# Patient Record
Sex: Male | Born: 1955 | Race: White | Hispanic: No | Marital: Single | Smoking: Current every day smoker
Health system: Southern US, Community
[De-identification: ages and names within clinical notes are randomized; demographics above are authoritative.]

## PROBLEM LIST (undated history)

## (undated) ENCOUNTER — Emergency Department (HOSPITAL_COMMUNITY): Admission: EM | Payer: 59 | Source: Home / Self Care

## (undated) DIAGNOSIS — Z8619 Personal history of other infectious and parasitic diseases: Secondary | ICD-10-CM

## (undated) DIAGNOSIS — F329 Major depressive disorder, single episode, unspecified: Secondary | ICD-10-CM

## (undated) DIAGNOSIS — F419 Anxiety disorder, unspecified: Secondary | ICD-10-CM

## (undated) DIAGNOSIS — E785 Hyperlipidemia, unspecified: Secondary | ICD-10-CM

## (undated) DIAGNOSIS — C801 Malignant (primary) neoplasm, unspecified: Secondary | ICD-10-CM

## (undated) DIAGNOSIS — N183 Chronic kidney disease, stage 3 unspecified: Secondary | ICD-10-CM

## (undated) DIAGNOSIS — F200 Paranoid schizophrenia: Secondary | ICD-10-CM

## (undated) DIAGNOSIS — R32 Unspecified urinary incontinence: Secondary | ICD-10-CM

## (undated) DIAGNOSIS — K219 Gastro-esophageal reflux disease without esophagitis: Secondary | ICD-10-CM

## (undated) DIAGNOSIS — F32A Depression, unspecified: Secondary | ICD-10-CM

---

## 1989-03-30 HISTORY — PX: CERVICAL DISC SURGERY: SHX588

## 2002-11-06 ENCOUNTER — Emergency Department (HOSPITAL_COMMUNITY): Admission: EM | Admit: 2002-11-06 | Discharge: 2002-11-06 | Payer: Self-pay | Admitting: Emergency Medicine

## 2002-11-06 ENCOUNTER — Encounter: Payer: Self-pay | Admitting: Emergency Medicine

## 2002-11-26 ENCOUNTER — Ambulatory Visit (HOSPITAL_COMMUNITY): Admission: RE | Admit: 2002-11-26 | Discharge: 2002-11-26 | Payer: Self-pay | Admitting: Neurosurgery

## 2002-11-26 ENCOUNTER — Encounter: Payer: Self-pay | Admitting: Neurosurgery

## 2003-01-19 ENCOUNTER — Encounter: Payer: Self-pay | Admitting: Neurosurgery

## 2003-01-19 ENCOUNTER — Inpatient Hospital Stay (HOSPITAL_COMMUNITY): Admission: RE | Admit: 2003-01-19 | Discharge: 2003-01-20 | Payer: Self-pay | Admitting: Neurosurgery

## 2003-03-18 ENCOUNTER — Encounter: Payer: Self-pay | Admitting: Neurosurgery

## 2003-03-18 ENCOUNTER — Encounter: Admission: RE | Admit: 2003-03-18 | Discharge: 2003-03-18 | Payer: Self-pay | Admitting: Neurosurgery

## 2015-01-13 ENCOUNTER — Encounter (HOSPITAL_BASED_OUTPATIENT_CLINIC_OR_DEPARTMENT_OTHER): Payer: Self-pay | Admitting: *Deleted

## 2015-01-13 ENCOUNTER — Emergency Department (HOSPITAL_BASED_OUTPATIENT_CLINIC_OR_DEPARTMENT_OTHER): Payer: Medicare Other

## 2015-01-13 ENCOUNTER — Inpatient Hospital Stay (HOSPITAL_BASED_OUTPATIENT_CLINIC_OR_DEPARTMENT_OTHER)
Admission: EM | Admit: 2015-01-13 | Discharge: 2015-01-20 | DRG: 871 | Disposition: A | Discharge status: Home Health | Payer: Medicare Other | Attending: Internal Medicine | Admitting: Internal Medicine

## 2015-01-13 DIAGNOSIS — R599 Enlarged lymph nodes, unspecified: Secondary | ICD-10-CM | POA: Diagnosis present

## 2015-01-13 DIAGNOSIS — A4181 Sepsis due to Enterococcus: Secondary | ICD-10-CM | POA: Diagnosis present

## 2015-01-13 DIAGNOSIS — N320 Bladder-neck obstruction: Secondary | ICD-10-CM | POA: Diagnosis not present

## 2015-01-13 DIAGNOSIS — T796XXA Traumatic ischemia of muscle, initial encounter: Secondary | ICD-10-CM | POA: Diagnosis not present

## 2015-01-13 DIAGNOSIS — G9340 Encephalopathy, unspecified: Secondary | ICD-10-CM | POA: Diagnosis not present

## 2015-01-13 DIAGNOSIS — M6282 Rhabdomyolysis: Secondary | ICD-10-CM | POA: Diagnosis present

## 2015-01-13 DIAGNOSIS — F200 Paranoid schizophrenia: Secondary | ICD-10-CM | POA: Diagnosis present

## 2015-01-13 DIAGNOSIS — E870 Hyperosmolality and hypernatremia: Secondary | ICD-10-CM | POA: Diagnosis present

## 2015-01-13 DIAGNOSIS — A419 Sepsis, unspecified organism: Secondary | ICD-10-CM | POA: Diagnosis present

## 2015-01-13 DIAGNOSIS — E860 Dehydration: Secondary | ICD-10-CM | POA: Diagnosis not present

## 2015-01-13 DIAGNOSIS — N179 Acute kidney failure, unspecified: Secondary | ICD-10-CM | POA: Diagnosis present

## 2015-01-13 DIAGNOSIS — B952 Enterococcus as the cause of diseases classified elsewhere: Secondary | ICD-10-CM | POA: Diagnosis present

## 2015-01-13 DIAGNOSIS — N390 Urinary tract infection, site not specified: Secondary | ICD-10-CM | POA: Diagnosis not present

## 2015-01-13 DIAGNOSIS — N189 Chronic kidney disease, unspecified: Secondary | ICD-10-CM | POA: Diagnosis not present

## 2015-01-13 DIAGNOSIS — W19XXXA Unspecified fall, initial encounter: Secondary | ICD-10-CM

## 2015-01-13 DIAGNOSIS — E876 Hypokalemia: Secondary | ICD-10-CM | POA: Diagnosis not present

## 2015-01-13 DIAGNOSIS — F17210 Nicotine dependence, cigarettes, uncomplicated: Secondary | ICD-10-CM | POA: Diagnosis not present

## 2015-01-13 DIAGNOSIS — E785 Hyperlipidemia, unspecified: Secondary | ICD-10-CM | POA: Diagnosis present

## 2015-01-13 DIAGNOSIS — N19 Unspecified kidney failure: Secondary | ICD-10-CM

## 2015-01-13 HISTORY — DX: Paranoid schizophrenia: F20.0

## 2015-01-13 LAB — RAPID URINE DRUG SCREEN, HOSP PERFORMED
AMPHETAMINES: NOT DETECTED
Barbiturates: NOT DETECTED
Benzodiazepines: NOT DETECTED
COCAINE: NOT DETECTED
OPIATES: NOT DETECTED
TETRAHYDROCANNABINOL: NOT DETECTED

## 2015-01-13 LAB — CBC WITH DIFFERENTIAL/PLATELET
BASOS ABS: 0 10*3/uL (ref 0.0–0.1)
BASOS PCT: 0 % (ref 0–1)
EOS ABS: 0 10*3/uL (ref 0.0–0.7)
EOS PCT: 0 % (ref 0–5)
HEMATOCRIT: 40.5 % (ref 39.0–52.0)
HEMOGLOBIN: 14.2 g/dL (ref 13.0–17.0)
LYMPHS PCT: 3 % — AB (ref 12–46)
Lymphs Abs: 0.7 10*3/uL (ref 0.7–4.0)
MCH: 28 pg (ref 26.0–34.0)
MCHC: 35.1 g/dL (ref 30.0–36.0)
MCV: 79.9 fL (ref 78.0–100.0)
MONO ABS: 1.4 10*3/uL — AB (ref 0.1–1.0)
Monocytes Relative: 6 % (ref 3–12)
Neutro Abs: 21.9 10*3/uL — ABNORMAL HIGH (ref 1.7–7.7)
Neutrophils Relative %: 91 % — ABNORMAL HIGH (ref 43–77)
Platelets: 270 10*3/uL (ref 150–400)
RBC: 5.07 MIL/uL (ref 4.22–5.81)
RDW: 14.4 % (ref 11.5–15.5)
WBC: 24 10*3/uL — AB (ref 4.0–10.5)

## 2015-01-13 LAB — URINE MICROSCOPIC-ADD ON

## 2015-01-13 LAB — URINALYSIS, ROUTINE W REFLEX MICROSCOPIC
Bilirubin Urine: NEGATIVE
Glucose, UA: NEGATIVE mg/dL
Ketones, ur: NEGATIVE mg/dL
NITRITE: NEGATIVE
Protein, ur: 30 mg/dL — AB
SPECIFIC GRAVITY, URINE: 1.005 (ref 1.005–1.030)
Urobilinogen, UA: 0.2 mg/dL (ref 0.0–1.0)
pH: 6 (ref 5.0–8.0)

## 2015-01-13 LAB — COMPREHENSIVE METABOLIC PANEL
ALK PHOS: 75 U/L (ref 38–126)
ALT: 118 U/L — AB (ref 17–63)
AST: 410 U/L — ABNORMAL HIGH (ref 15–41)
Albumin: 3.8 g/dL (ref 3.5–5.0)
Anion gap: 12 (ref 5–15)
BUN: 15 mg/dL (ref 6–20)
CALCIUM: 8.6 mg/dL — AB (ref 8.9–10.3)
CO2: 25 mmol/L (ref 22–32)
Chloride: 99 mmol/L — ABNORMAL LOW (ref 101–111)
Creatinine, Ser: 2.51 mg/dL — ABNORMAL HIGH (ref 0.61–1.24)
GFR, EST AFRICAN AMERICAN: 31 mL/min — AB (ref >60)
GFR, EST NON AFRICAN AMERICAN: 27 mL/min — AB (ref >60)
Glucose, Bld: 140 mg/dL — ABNORMAL HIGH (ref 65–99)
Potassium: 2.5 mmol/L — CL (ref 3.5–5.1)
SODIUM: 136 mmol/L (ref 135–145)
Total Bilirubin: 1.1 mg/dL (ref 0.3–1.2)
Total Protein: 6.7 g/dL (ref 6.5–8.1)

## 2015-01-13 LAB — I-STAT CG4 LACTIC ACID, ED: Lactic Acid, Venous: 1.23 mmol/L (ref 0.5–2.0)

## 2015-01-13 LAB — MRSA PCR SCREENING: MRSA by PCR: NEGATIVE

## 2015-01-13 LAB — CK: CK TOTAL: 48660 U/L — AB (ref 49–397)

## 2015-01-13 LAB — ETHANOL: Alcohol, Ethyl (B): <5 mg/dL (ref <5)

## 2015-01-13 MED ORDER — CEFTRIAXONE SODIUM IN DEXTROSE 20 MG/ML IV SOLN
1.0000 g | INTRAVENOUS | Status: DC
  Administered 2015-01-13: 1 g via INTRAVENOUS
  Filled 2015-01-13 (×2): qty 50

## 2015-01-13 MED ORDER — SODIUM CHLORIDE 0.9 % IV SOLN
INTRAVENOUS | Status: DC
  Administered 2015-01-14 (×2): via INTRAVENOUS

## 2015-01-13 MED ORDER — POTASSIUM CHLORIDE CRYS ER 20 MEQ PO TBCR: 30.0000 meq | EXTENDED_RELEASE_TABLET | Freq: Once | ORAL | Status: DC

## 2015-01-13 MED ORDER — VANCOMYCIN HCL IN DEXTROSE 1-5 GM/200ML-% IV SOLN
1000.0000 mg | Freq: Once | INTRAVENOUS | Status: AC
  Administered 2015-01-13: 1000 mg via INTRAVENOUS
  Filled 2015-01-13: qty 200

## 2015-01-13 MED ORDER — HEPARIN SODIUM (PORCINE) 5000 UNIT/ML IJ SOLN
5000.0000 [IU] | Freq: Three times a day (TID) | INTRAMUSCULAR | Status: DC
  Administered 2015-01-13 – 2015-01-20 (×21): 5000 [IU] via SUBCUTANEOUS
  Filled 2015-01-13 (×22): qty 1

## 2015-01-13 MED ORDER — ACETAMINOPHEN 325 MG PO TABS
650.0000 mg | ORAL_TABLET | Freq: Once | ORAL | Status: AC
  Administered 2015-01-13: 650 mg via ORAL
  Filled 2015-01-13: qty 2

## 2015-01-13 MED ORDER — TETANUS-DIPHTH-ACELL PERTUSSIS 5-2.5-18.5 LF-MCG/0.5 IM SUSP
0.5000 mL | Freq: Once | INTRAMUSCULAR | Status: AC
  Administered 2015-01-13: 0.5 mL via INTRAMUSCULAR
  Filled 2015-01-13: qty 0.5

## 2015-01-13 MED ORDER — POTASSIUM CHLORIDE CRYS ER 20 MEQ PO TBCR
40.0000 meq | EXTENDED_RELEASE_TABLET | Freq: Once | ORAL | Status: AC
  Administered 2015-01-13: 40 meq via ORAL
  Filled 2015-01-13: qty 2

## 2015-01-13 MED ORDER — PIPERACILLIN-TAZOBACTAM 3.375 G IVPB
3.3750 g | Freq: Once | INTRAVENOUS | Status: AC
  Administered 2015-01-13: 3.375 g via INTRAVENOUS
  Filled 2015-01-13: qty 50

## 2015-01-13 MED ORDER — SODIUM CHLORIDE 0.9 % IV SOLN
INTRAVENOUS | Status: DC
  Administered 2015-01-13 – 2015-01-18 (×5): via INTRAVENOUS

## 2015-01-13 MED ORDER — CLOZAPINE 100 MG PO TABS
800.0000 mg | ORAL_TABLET | Freq: Every day | ORAL | Status: DC
  Administered 2015-01-13 – 2015-01-19 (×6): 800 mg via ORAL
  Filled 2015-01-13 (×9): qty 8

## 2015-01-13 MED ORDER — BENZTROPINE MESYLATE 0.5 MG PO TABS
0.5000 mg | ORAL_TABLET | Freq: Every morning | ORAL | Status: DC
  Administered 2015-01-15 – 2015-01-20 (×6): 0.5 mg via ORAL
  Filled 2015-01-13 (×7): qty 1

## 2015-01-13 MED ORDER — SODIUM CHLORIDE 0.9 % IV BOLUS (SEPSIS): 1000.0000 mL | Freq: Once | INTRAVENOUS | Status: DC

## 2015-01-13 MED ORDER — SODIUM CHLORIDE 0.9 % IJ SOLN
3.0000 mL | Freq: Two times a day (BID) | INTRAMUSCULAR | Status: DC
  Administered 2015-01-13 – 2015-01-20 (×12): 3 mL via INTRAVENOUS

## 2015-01-13 MED ORDER — LIDOCAINE-EPINEPHRINE 2 %-1:100000 IJ SOLN
1.0000 mL | Freq: Once | INTRAMUSCULAR | Status: AC
  Administered 2015-01-13: 1 mL

## 2015-01-13 MED ORDER — POTASSIUM CHLORIDE 10 MEQ/100ML IV SOLN
10.0000 meq | Freq: Once | INTRAVENOUS | Status: AC
  Administered 2015-01-13: 10 meq via INTRAVENOUS
  Filled 2015-01-13: qty 100

## 2015-01-13 MED ORDER — SODIUM CHLORIDE 0.9 % IV BOLUS (SEPSIS)
1000.0000 mL | Freq: Once | INTRAVENOUS | Status: AC
  Administered 2015-01-13: 1000 mL via INTRAVENOUS

## 2015-01-13 NOTE — ED Notes (Signed)
His brother had not heard from him in 4 days. He stopped by his house to check on him and he was found on the floor. Responsive. Blood on his face. Laceration to his left eyebrow and hematoma to his forehead. Left arm pain. He is alert but confused.

## 2015-01-13 NOTE — ED Notes (Signed)
Urinal provided.

## 2015-01-13 NOTE — ED Provider Notes (Addendum)
Patient care accepted from Dr. Maryan Rued  4:01 PM This is a 59 year old man who comes in today with fever to 10 to, tachycardia, and generalized weakness. Workup is significant for urinary tract infection. Patient has received 1 L of normal saline, Zosyn, and vancomycin. He has had full workup with head CT, chest x-Rennie Hack, urinalysis, and blood cultures. Head CT done as he hit his head from fall due to weakness. Weakness appears to be generalized.  1 infectious disease-patient is febrile, tachycardic with likely source urine. Urine culture and blood cultures are pending. Patient has received IV fluids, Zosyn, and vancomycin. Initial lactic acid is normal. Patient is normotensive. He appears to be at baseline mental status. His creatinine is elevated to 5 indicating some acute kidney injury. LFTs are also elevated area 2-neuro-  patient with generalized weakness and laceration to head from fall. Patient has nonfocal neurological exam and is hypokalemic which could explain his generalized weakness. He had a head CT as it was noted he had trauma to his head with no acute intracranial abnormalities noted. 3- metabolic- hypokalemia- being repleted. 4- psychiatric- known schizophrenia- appears stable on meds. 5- respiratory- patient states some cough, but cxr clear.  Patient is smoker. 6 cardiac- tachycardia- likely secondary to fever, possibly infection, and dehydration.  7- trauma- head ct negative.  Laceration repaired after fall- sutures out 5-7 days.  Knee and elbow contusions.  CK pending.    Plan transfer to [facility] for further evaluation and treatment.   Prescription with Dr. Dyann Kief and plan transfer to stepdown unit at [facility].  Pattricia Boss, MD 01/13/15 [id number]  Pattricia Boss, MD 01/13/15 [phone]

## 2015-01-13 NOTE — ED Notes (Signed)
Pt was found on the floor in his home by his brother.  Unknown time pt has been on the floor or sequence of events

## 2015-01-13 NOTE — ED Notes (Signed)
[facility] at bedside preparing for transport.  Pt stable upon transport.

## 2015-01-13 NOTE — Progress Notes (Signed)
Called from [facility]. (Dr. Jeanell Sparrow)  Patient is Edwin [name]; 58 y/o male with hx of schizophrenia who presented after fall, with multiple bruises and generalized weakness. Patient found to be dehydrated, hypokalemic, febrile up to 102, renal failure Cr 2.5 and UA suggesting UTI. CXR clear. CT head and spine w/o acute abnormalities. Initiated on sepsis protocol. Lactic acid normal.  Plan: -accepted to Stepdown, inpatient -on broad spectrum antibiotics and receiving fluid resuscitation -CK pending -follow cx's (Urine and blood cx's)   Barton Dubois [phone]

## 2015-01-13 NOTE — ED Notes (Signed)
Pt unable to void.  I&O cath performed without difficulty with cloudy amber return and sediment noted. Pt tolerated well.

## 2015-01-13 NOTE — ED Provider Notes (Signed)
CSN: [id number]     Arrival date & time 01/13/15  1401 History   First MD Initiated Contact with Patient 01/13/15 1413     Chief Complaint  Patient presents with  . Fall  . Head Injury     (Consider location/radiation/quality/duration/timing/severity/associated sxs/prior Treatment) HPI Comments: Patient being brought in by his brother with a history of paranoid schizophrenia on medication who lives alone. Brother had not heard from him in several days and was unable to reach him by phone. When he got to the house the patient was lying in the floor of his home awake and responsive. He was unable to get up on his own. Brother was able to pick him up and after sitting up he was able to stand and walk independently. Brother states normally he is able to drive and take care of himself without difficulty. This is very different from baseline today. Brother denies patient having history of falls or substance use. The patient is unclear why he fell but denies LOC. He does not remember how long he's been on the floor but brother states it appears that he was crawling around on the floor for some time.  Per patient's brother he has not had any recent medication changes. He does have a history of prior neck surgery but has no difficulty ambulating or persistent weakness.  Patient denies cough, shortness of breath, nausea, vomiting or abdominal pain.  Patient is a heavy smoker. He has eaten today.  Patient is a 59 y.o. male presenting with fall and head injury. The history is provided by the patient and a relative. The history is limited by the condition of the patient.  Fall This is a new problem. Episode onset: Unknown. The problem occurs constantly.  Head Injury   Past Medical History  Diagnosis Date  . Paranoid schizophrenia    Past Surgical History  Procedure Laterality Date  . Neck surgery     No family history on file. History  Substance Use Topics  . Smoking status: Current Every Day Smoker  -- 1.00 packs/day    Types: Cigarettes  . Smokeless tobacco: Not on file  . Alcohol Use: No    Review of Systems  All other systems reviewed and are negative.     Allergies  Review of patient's allergies indicates no known allergies.  Home Medications   Prior to Admission medications   Not on File   BP 107/72 mmHg  Pulse 124  Temp(Src) 100.9 F (38.3 C) (Oral)  Resp 16  Ht 5\' 9"  (1.753 m)  SpO2 95% Physical Exam  Constitutional: He is oriented to person, place, and time. He appears well-developed and well-nourished. No distress.  Disheveled appearance and smells of urine  HENT:  Head: Normocephalic. Head is with abrasion and with laceration.    Right Ear: Tympanic membrane and ear canal normal.  Left Ear: Tympanic membrane and ear canal normal.  Mouth/Throat: Oropharynx is clear and moist.  Multiple abrasions over the for head and a laceration over the left eyebrow.  Multiple dental caries and gingival disease. Moist mucous membranes.  Eyes: Conjunctivae and EOM are normal. Pupils are equal, round, and reactive to light.  Neck: Normal range of motion. Neck supple.  Cardiovascular: Regular rhythm and intact distal pulses.  Tachycardia present.   No murmur heard. Pulmonary/Chest: Effort normal and breath sounds normal. No respiratory distress. He has no wheezes. He has no rales.  Abdominal: Soft. He exhibits no distension. There is no tenderness. There is  no rebound and no guarding.  Musculoskeletal: Normal range of motion. He exhibits no edema or tenderness.       Right knee: He exhibits swelling and ecchymosis.       Left knee: He exhibits swelling and ecchymosis.       Legs: Bilateral knees and elbows with multiple abrasions, swelling and erythema. Forming pressure ulcers on bilateral elbows and knees.  Neurological: He is alert and oriented to person, place, and time.  Skin: Skin is warm and dry. No rash noted. No erythema.  Psychiatric: He has a normal mood and  affect. His behavior is normal.  Nursing note and vitals reviewed.   ED Course  Procedures (including critical care time) Labs Review Labs Reviewed  CBC WITH DIFFERENTIAL/PLATELET - Abnormal; Notable for the following:    WBC 24.0 (*)    Neutrophils Relative % 91 (*)    Neutro Abs 21.9 (*)    Lymphocytes Relative 3 (*)    Monocytes Absolute 1.4 (*)    All other components within normal limits  COMPREHENSIVE METABOLIC PANEL - Abnormal; Notable for the following:    Potassium 2.5 (*)    Chloride 99 (*)    Glucose, Bld 140 (*)    Creatinine, Ser 2.51 (*)    Calcium 8.6 (*)    AST 410 (*)    ALT 118 (*)    GFR calc non Af Amer 27 (*)    GFR calc Af Amer 31 (*)    All other components within normal limits  URINE CULTURE  CULTURE, BLOOD (ROUTINE X 2)  CULTURE, BLOOD (ROUTINE X 2)  ETHANOL  URINALYSIS, ROUTINE W REFLEX MICROSCOPIC (NOT AT [facility])  URINE RAPID DRUG SCREEN, HOSP PERFORMED  CK  I-STAT CG4 LACTIC ACID, ED    Imaging Review Ct Head Wo Contrast  01/13/2015   CLINICAL DATA:  Pain following fall  EXAM: CT HEAD WITHOUT CONTRAST  CT CERVICAL SPINE WITHOUT CONTRAST  TECHNIQUE: Multidetector CT imaging of the head and cervical spine was performed following the standard protocol without intravenous contrast. Multiplanar CT image reconstructions of the cervical spine were also generated.  COMPARISON:  None.  FINDINGS: CT HEAD FINDINGS  The ventricles are normal in size and configuration. There is no intracranial mass, hemorrhage, extra-axial fluid collection, or midline shift. Gray-white compartments are normal. No acute infarct evident. There is a right frontal scalp hematoma. The bony calvarium appears intact. The mastoid air cells are clear.  CT CERVICAL SPINE FINDINGS  The patient is status post anterior screw and plate fixation with fusion at C4 and C5. The screw and plate fixation device appears intact. There is ankylosis at C4-5.  There is no fracture or spondylolisthesis.  Prevertebral soft tissues and predental space regions are normal. There is a large subchondral type cyst in the superior odontoid. There is bony overgrowth at the predental space but no predental space widening. There are anterior osteophytes at C3, C5, C6, and C7. There is facet hypertrophy at several levels. No disc extrusion or stenosis. There is exit foraminal narrowing at C6-7 bilaterally impressing on the respective exiting nerve roots at this level bilaterally.  IMPRESSION: CT head: Right frontal scalp hematoma. No intracranial mass, hemorrhage, or extra-axial fluid collection. Gray-white compartments appear normal.  CT cervical spine: Multilevel osteoarthritic change. Postoperative change at C4 and C5. No fracture or spondylolisthesis.   Electronically Signed   By: Lowella Grip III M.D.   On: 01/13/2015 15:09   Ct Cervical Spine Wo Contrast  01/13/2015   : CT head and CT cervical spine reports are combined into a single dictation.   Electronically Signed   By: Lowella Grip III M.D.   On: 01/13/2015 15:13     EKG Interpretation   Date/Time:  Thursday January 13 2015 15:32:04 EDT Ventricular Rate:  99 PR Interval:  160 QRS Duration: 90 QT Interval:  438 QTC Calculation: 562 R Axis:   44 Text Interpretation:  Normal sinus rhythm Prolonged QT No previous tracing  Confirmed by Maryan Rued  MD, Loree Fee ([id number]) on 01/13/2015 3:49:23 PM     LACERATION REPAIR Performed by: Blanchie Dessert Authorized by: Blanchie Dessert Consent: Verbal consent obtained. Risks and benefits: risks, benefits and alternatives were discussed Consent given by: patient Patient identity confirmed: provided demographic data Prepped and Draped in normal sterile fashion Wound explored  Laceration Location: left eyebrow  Laceration Length: 4cm  No Foreign Bodies seen or palpated  Anesthesia: local infiltration  Local anesthetic: lidocaine 2% with epinephrine  Anesthetic total: 3 ml  Irrigation  method: syringe Amount of cleaning: standard  Skin closure: 6.0 vicryl  Number of sutures: 7  Technique: running  Patient tolerance: Patient tolerated the procedure well with no immediate complications.  MDM   Final diagnoses:  Fall  Fall  Fall   Patient with a history of schizophrenia who takes clozapine normally functions independently at home however his brother had not heard from him in several days and when he went by to find him he was lying on his floor not acting himself. He was unable to get up and was crawling around on his hands and knees. Patient does not have a history of substance abuse and has not had any new medication changes.  On exam patient has pressure sores on his elbows as well as abrasions to bilateral knees and swelling. He is able to stand but is generally weak. When trying to sit in the bed he is unable to move his legs over however he does have normal strength in the legs. Decreased strength in the upper extremities most notably on the left. No facial droop noted. Patient has abrasions to his face as well as a laceration over his left eye. He takes no anticoagulation. He denies any recent cough or shortness of breath and he has no abdominal pain on exam. However patient is a vague historian and unclear if what he says is true. He does not know what caused him to fall and he does not know how long he's been on the floor.   Unclear why patient has a low-grade fever today whether patient's symptoms are related to intercranial hemorrhage versus underlying infection such as UTI such as rhabdo with renal impairment.  CBC, CMP, UA, EtOH, UDS, CK, lactate, chest x-ray, head CT, C-spine pending. Patient's tetanus shot was updated. Laceration to the left eye repaired.  3:49 PM Patient found to have a leukocytosis of 24,000, new acute renal failure with creatinine of 2.5, hypokalemia of 2.5, elevated LFTs. Rectal temperature is 102. Concern for sepsis. Patient covered  broadly with Vanco and Zosyn without a clear-cut source. Blood pressure remains stable will give a second liter of fluid. Patient checked at Dr. Illene Silver, MD 01/13/15 [phone]

## 2015-01-13 NOTE — ED Notes (Signed)
Laceration to left brow sutured by Dr. Maryan Rued; pt tolerated well.

## 2015-01-13 NOTE — H&P (Signed)
[facility] History and Physical  Edwin [name] XBM:[id number] DOB: 1956/02/28 DOA: 01/13/2015  Referring physician: EDP PCP: No primary care provider on file.   Chief Complaint: Fall   HPI: Edwin [name] is a 59 y.o. male h/o paranoid schizophrenia, found down at his home by brother who went to check up on him after he wasn't answering phone for a couple of days.  Patient was found on floor at home, awake and responsive.  Normally takes care of self without trouble.  Patient admits to 2-3 day history of pain and burning on urination.  No cough, no SOB, no N/V/D, no abd pain.  Work up in ED is consistent with sepsis, UTI as likely source, AKI, and CK showing rhabdomyolysis at just under 50k.  Review of Systems: Systems reviewed.  As above, otherwise negative  Past Medical History  Diagnosis Date  . Paranoid schizophrenia    Past Surgical History  Procedure Laterality Date  . Neck surgery     Social History:  reports that he has been smoking Cigarettes.  He has been smoking about 1.00 pack per day. He does not have any smokeless tobacco history on file. He reports that he does not drink alcohol or use illicit drugs.  No Known Allergies  No family history on file.   Prior to Admission medications   Medication Sig Start Date End Date Taking? Authorizing Provider  benztropine (COGENTIN) 0.5 MG tablet Take 0.5 mg by mouth every morning. 12/13/14  Yes Historical Provider, MD  clozapine (CLOZARIL) 200 MG tablet Take 800 mg by mouth at bedtime. 12/10/14  Yes Historical Provider, MD  simvastatin (ZOCOR) 20 MG tablet Take 20 mg by mouth daily. 10/10/14  Yes Historical Provider, MD   Physical Exam: Filed Vitals:   01/13/15 2006  BP:   Pulse:   Temp: 100.1 F (37.8 C)  Resp:     BP 129/73 mmHg  Pulse 101  Temp(Src) 100.1 F (37.8 C) (Oral)  Resp 16  Ht 5\' 11"  (1.803 m)  Wt 88 kg (194 lb 0.1 oz)  BMI 27.07 kg/m2  SpO2 96%  General Appearance:    Alert, oriented   Head:    Normocephalic, abrasions and lacerations  Eyes:    PERRL, EOMI, sclera non-icteric        Nose:   Nares without drainage or epistaxis. Mucosa, turbinates normal  Throat:   Moist mucous membranes. Oropharynx without erythema or exudate.  Neck:   Supple. No carotid bruits.  No thyromegaly.  No lymphadenopathy.   Back:     No CVA tenderness, no spinal tenderness  Lungs:     Clear to auscultation bilaterally, without wheezes, rhonchi or rales  Chest wall:    No tenderness to palpitation  Heart:    Regular rate and rhythm without murmurs, gallops, rubs  Abdomen:     Soft, non-tender, nondistended, normal bowel sounds, no organomegaly  Genitalia:    deferred  Rectal:    deferred  Extremities:   Swelling and echymosis to knees, pressure ulcers forming  Pulses:   2+ and symmetric all extremities  Skin:   Skin color, texture, turgor normal, no rashes or lesions  Lymph nodes:   Cervical, supraclavicular, and axillary nodes normal  Neurologic:   CNII-XII intact. Normal strength, sensation and reflexes      throughout    Labs on Admission:  Basic Metabolic Panel:  Recent Labs Lab 01/13/15 1500  NA 136  K 2.5*  CL 99*  CO2 25  GLUCOSE 140*  BUN 15  CREATININE 2.51*  CALCIUM 8.6*   Liver Function Tests:  Recent Labs Lab 01/13/15 1500  AST 410*  ALT 118*  ALKPHOS 75  BILITOT 1.1  PROT 6.7  ALBUMIN 3.8   No results for input(s): LIPASE, AMYLASE in the last 168 hours. No results for input(s): AMMONIA in the last 168 hours. CBC:  Recent Labs Lab 01/13/15 1500  WBC 24.0*  NEUTROABS 21.9*  HGB 14.2  HCT 40.5  MCV 79.9  PLT 270   Cardiac Enzymes:  Recent Labs Lab 01/13/15 1500  CKTOTAL 27782*    BNP (last 3 results) No results for input(s): PROBNP in the last 8760 hours. CBG: No results for input(s): GLUCAP in the last 168 hours.  Radiological Exams on Admission: Dg Chest 2 View  01/13/2015   CLINICAL DATA:  Status post fall x3 with multifocal  bruising. Altered mental status today. Initial encounter.  EXAM: CHEST  2 VIEW  COMPARISON:  None.  FINDINGS: The lungs are clear. Heart size is normal. No pneumothorax or pleural effusion.  IMPRESSION: No acute disease.   Electronically Signed   By: Inge Rise M.D.   On: 01/13/2015 16:17   Ct Head Wo Contrast  01/13/2015   CLINICAL DATA:  Pain following fall  EXAM: CT HEAD WITHOUT CONTRAST  CT CERVICAL SPINE WITHOUT CONTRAST  TECHNIQUE: Multidetector CT imaging of the head and cervical spine was performed following the standard protocol without intravenous contrast. Multiplanar CT image reconstructions of the cervical spine were also generated.  COMPARISON:  None.  FINDINGS: CT HEAD FINDINGS  The ventricles are normal in size and configuration. There is no intracranial mass, hemorrhage, extra-axial fluid collection, or midline shift. Gray-white compartments are normal. No acute infarct evident. There is a right frontal scalp hematoma. The bony calvarium appears intact. The mastoid air cells are clear.  CT CERVICAL SPINE FINDINGS  The patient is status post anterior screw and plate fixation with fusion at C4 and C5. The screw and plate fixation device appears intact. There is ankylosis at C4-5.  There is no fracture or spondylolisthesis. Prevertebral soft tissues and predental space regions are normal. There is a large subchondral type cyst in the superior odontoid. There is bony overgrowth at the predental space but no predental space widening. There are anterior osteophytes at C3, C5, C6, and C7. There is facet hypertrophy at several levels. No disc extrusion or stenosis. There is exit foraminal narrowing at C6-7 bilaterally impressing on the respective exiting nerve roots at this level bilaterally.  IMPRESSION: CT head: Right frontal scalp hematoma. No intracranial mass, hemorrhage, or extra-axial fluid collection. Gray-white compartments appear normal.  CT cervical spine: Multilevel osteoarthritic  change. Postoperative change at C4 and C5. No fracture or spondylolisthesis.   Electronically Signed   By: Lowella Grip III M.D.   On: 01/13/2015 15:09   Ct Cervical Spine Wo Contrast  01/13/2015   : CT head and CT cervical spine reports are combined into a single dictation.   Electronically Signed   By: Lowella Grip III M.D.   On: 01/13/2015 15:13    EKG: Independently reviewed.  Assessment/Plan Principal Problem:   Sepsis secondary to UTI Active Problems:   Paranoid schizophrenia, chronic condition   AKI (acute kidney injury)   Rhabdomyolysis   1. Sepsis secondary to UTI - 1. Got dose of vanc and zosyn in ED before source was known 2. Will now put patient on rocephin as per our urinary source of  sepsis protocol for community acquired urosepsis source 3. Sepsis pathway 4. Urine and blood cx pending 2. Rhabdomyolysis - 1. IVF at 150 cc/hr, got bolus in ED 2. Strict intake and output 3. Repeat BMP in AM 3. AKI - due to rhabdo vs sepsis, treatment as above 4. Paranoid schizophrenia - continue home meds 5. HLD - stopping statin due to rhabdo    Code Status: Full Code  Family Communication: No family in room Disposition Plan: Admit to inpatient   Time spent: 70 min  GARDNER, JARED M. [facility] Pager [phone]  If 7AM-7PM, please contact the day team taking care of the patient [email or web address] Password [facility] 01/13/2015, 8:15 PM

## 2015-01-14 ENCOUNTER — Encounter (HOSPITAL_COMMUNITY): Payer: Self-pay | Admitting: *Deleted

## 2015-01-14 DIAGNOSIS — T796XXA Traumatic ischemia of muscle, initial encounter: Secondary | ICD-10-CM

## 2015-01-14 LAB — CBC
HEMATOCRIT: 35.9 % — AB (ref 39.0–52.0)
Hemoglobin: 12.4 g/dL — ABNORMAL LOW (ref 13.0–17.0)
MCH: 27.9 pg (ref 26.0–34.0)
MCHC: 34.5 g/dL (ref 30.0–36.0)
MCV: 80.7 fL (ref 78.0–100.0)
Platelets: 224 10*3/uL (ref 150–400)
RBC: 4.45 MIL/uL (ref 4.22–5.81)
RDW: 14.4 % (ref 11.5–15.5)
WBC: 26.2 10*3/uL — ABNORMAL HIGH (ref 4.0–10.5)

## 2015-01-14 LAB — CK: Total CK: 38104 U/L — ABNORMAL HIGH (ref 49–397)

## 2015-01-14 LAB — BASIC METABOLIC PANEL
Anion gap: 10 (ref 5–15)
BUN: 15 mg/dL (ref 6–20)
CALCIUM: 7.8 mg/dL — AB (ref 8.9–10.3)
CO2: 21 mmol/L — ABNORMAL LOW (ref 22–32)
Chloride: 108 mmol/L (ref 101–111)
Creatinine, Ser: 2.58 mg/dL — ABNORMAL HIGH (ref 0.61–1.24)
GFR calc Af Amer: 30 mL/min — ABNORMAL LOW (ref >60)
GFR, EST NON AFRICAN AMERICAN: 26 mL/min — AB (ref >60)
GLUCOSE: 126 mg/dL — AB (ref 65–99)
Potassium: 2.8 mmol/L — ABNORMAL LOW (ref 3.5–5.1)
Sodium: 139 mmol/L (ref 135–145)

## 2015-01-14 MED ORDER — LORAZEPAM 2 MG/ML IJ SOLN
1.0000 mg | Freq: Once | INTRAMUSCULAR | Status: AC | PRN
  Administered 2015-01-14: 1 mg via INTRAVENOUS

## 2015-01-14 MED ORDER — PIPERACILLIN-TAZOBACTAM 3.375 G IVPB 30 MIN
3.3750 g | Freq: Once | INTRAVENOUS | Status: AC
  Administered 2015-01-14: 3.375 g via INTRAVENOUS
  Filled 2015-01-14: qty 50

## 2015-01-14 MED ORDER — PIPERACILLIN-TAZOBACTAM 3.375 G IVPB
3.3750 g | Freq: Three times a day (TID) | INTRAVENOUS | Status: DC
  Administered 2015-01-14 – 2015-01-16 (×5): 3.375 g via INTRAVENOUS
  Filled 2015-01-14 (×7): qty 50

## 2015-01-14 MED ORDER — LORAZEPAM 2 MG/ML IJ SOLN
INTRAMUSCULAR | Status: AC
  Administered 2015-01-14: 1 mg via INTRAVENOUS
  Filled 2015-01-14: qty 1

## 2015-01-14 MED ORDER — PIPERACILLIN-TAZOBACTAM 3.375 G IVPB 30 MIN
3.3750 g | Freq: Once | INTRAVENOUS | Status: DC
  Filled 2015-01-14: qty 50

## 2015-01-14 MED ORDER — CEFTRIAXONE SODIUM IN DEXTROSE 40 MG/ML IV SOLN
2.0000 g | INTRAVENOUS | Status: DC
  Filled 2015-01-14: qty 50

## 2015-01-14 NOTE — Progress Notes (Signed)
ANTIBIOTIC CONSULT NOTE - INITIAL  Pharmacy Consult for zosyn Indication: rule out sepsis  No Known Allergies  Patient Measurements: Height: 5\' 11"  (180.3 cm) Weight: 194 lb 0.1 oz (88 kg) IBW/kg (Calculated) : 75.3   Vital Signs: Temp: 100.9 F (38.3 C) (06/17 0800) Temp Source: Axillary (06/17 0800) BP: 127/72 mmHg (06/17 1000) Pulse Rate: 98 (06/17 1000) Intake/Output from previous day: 06/16 0701 - 06/17 0700 In: 4142.5 [P.O.:240; I.V.:3852.5; IV Piggyback:50] Out: 1175 [CBSWH:6759] Intake/Output from this shift:    Labs:  Recent Labs  01/13/15 1500 01/14/15 0237  WBC 24.0* 26.2*  HGB 14.2 12.4*  PLT 270 224  CREATININE 2.51* 2.58*   Estimated Creatinine Clearance: 34.1 mL/min (by C-G formula based on Cr of 2.58). No results for input(s): VANCOTROUGH, VANCOPEAK, VANCORANDOM, GENTTROUGH, GENTPEAK, GENTRANDOM, TOBRATROUGH, TOBRAPEAK, TOBRARND, AMIKACINPEAK, AMIKACINTROU, AMIKACIN in the last 72 hours.   Microbiology: Recent Results (from the past 720 hour(s))  Culture, blood (routine x 2)     Status: None (Preliminary result)   Collection Time: 01/13/15  4:00 PM  Result Value Ref Range Status   Specimen Description BLOOD RIGHT ANTECUBITAL  Final   Special Requests BOTTLES DRAWN AEROBIC AND ANAEROBIC  5CC EACH  Final   Culture   Final    GRAM NEGATIVE RODS AEROBIC BOTTLE ONLY CRITICAL RESULT CALLED TO, READ BACK BY AND VERIFIED WITH: J. LINDSAY @ [place] ON [id number] BY Rhea Bleacher Performed at [facility]    Report Status PENDING  Incomplete  MRSA PCR Screening     Status: None   Collection Time: 01/13/15 10:02 PM  Result Value Ref Range Status   MRSA by PCR NEGATIVE NEGATIVE Final    Comment:        The GeneXpert MRSA Assay (FDA approved for NASAL specimens only), is one component of a comprehensive MRSA colonization surveillance program. It is not intended to diagnose MRSA infection nor to guide or monitor treatment for MRSA infections.      Medical History: Past Medical History  Diagnosis Date  . Paranoid schizophrenia     Medications:  Prescriptions prior to admission  Medication Sig Dispense Refill Last Dose  . benztropine (COGENTIN) 0.5 MG tablet Take 0.5 mg by mouth every morning.  4 01/11/2015 at Unknown time  . clozapine (CLOZARIL) 200 MG tablet Take 800 mg by mouth at bedtime.  5 01/11/2015 at Unknown time  . simvastatin (ZOCOR) 20 MG tablet Take 20 mg by mouth daily.  3 01/11/2015 at Unknown time   Assessment: 59 yo man to start zosyn for r/o sepsis, gnr in urine culture.  His CrCl ~34 ml/min  Goal of Therapy:  Eradication of infection  Plan:  Zosyn 3.375 gm IV X 1 now over 30 min then 3.375 gm IV q8 hours over 4 hours F/u renal function, cultures and clinical course  Thanks for allowing pharmacy to be a part of this patient's care.  Excell Seltzer, PharmD Clinical Pharmacist, [phone] 01/14/2015,12:12 PM

## 2015-01-14 NOTE — Progress Notes (Signed)
This note also relates to the following rows which could not be included: BP - Cannot attach notes to unvalidated device data MAP (mmHg) - Cannot attach notes to unvalidated device data Pulse Rate - Cannot attach notes to unvalidated device data ECG Heart Rate - Cannot attach notes to unvalidated device data Resp - Cannot attach notes to unvalidated device data SpO2 - Cannot attach notes to unvalidated device data     01/14/15 0800  Vitals  Temp (!) 100.9 F (38.3 C)  Temp Source Axillary  BP Location Left Arm  BP Method Automatic  Patient Position (if appropriate) Lying  Pulse Rate Source Monitor  Cardiac Rhythm ST  Oxygen Therapy  O2 Device Room Air  Pain Assessment  Pain Assessment PAINAD  PAINAD (Pain Assessment in Advanced Dementia)  Breathing 0  Negative Vocalization 0  Facial Expression 0  Body Language 0  Consolability 0  PAINAD Score 0  Glasgow Coma Scale  Eye Opening 3  Best Verbal Response (NON-intubated) 4  Best Motor Response 5  Glasgow Coma Scale Score 12  pt unable to take meds/food, pt with pos BC, MD/N

## 2015-01-14 NOTE — Progress Notes (Signed)
[facility] PROGRESS NOTE  Edwin [name] ZDG:[id number] DOB: [birth year] DOA: 01/13/2015 PCP: No primary care provider on file.  Assessment/Plan: 1. Sepsis due to UTI- patient initially started on vancomycin and Zosyn, was later changed to Rocephin. Blood cultures growing gram-negative rods, I will restart Zosyn at this time until we have final culture results back. 2. Encephalopathy- patient is confused has a history of paranoid schizophrenia. And his confusion is getting worse today UTI/sepsis. We'll continue with the antibiotic treatment. 3. Rhabdomyolysis- patient started on IV fluids normal saline at 150 mL per hour. [place] [address], will check CK in a.m. 4. Acute kidney injury- patient's creatinine is 2.58 today, baseline is unknown at this time. Will continue with IV fluids. Recheck BMP in a.m. 5. Transaminitis- elevated AST and ALT level likely from the rhabdomyolysis. 6. Hypokalemia- replace potassium and check BMP in a.m. 7. Hyperlipidemia- patient was taking statin at home which has been discontinued this time due to the rhabdomyolysis 8. Paranoid schizophrenia- psychotropic  medications on hold as patient is unable to take  by mouth. 9.  Code Status: Full code Family Communication: *No family at bedside Disposition Plan: Home when medically stable   Consultants:  None  Procedures:  None  Antibiotics:  None  HPI/Subjective: 59 y.o. male h/o paranoid schizophrenia, found down at his home by brother who went to check up on him after he wasn't answering phone for a couple of days. Patient was found on floor at home, awake and responsive. Normally takes care of self without trouble. Patient admits to 2-3 day history of pain and burning on urination  Patient is confused this morning, wbc is still 26000. Blood culture growing GNR  Objective: Filed Vitals:   01/14/15 1000  BP: 127/72  Pulse: 98  Temp:   Resp: 26    Intake/Output Summary (Last 24 hours)  at 01/14/15 1143 Last data filed at 01/14/15 0659  Gross per 24 hour  Intake 4142.5 ml  Output   1175 ml  Net 2967.5 ml   Filed Weights   01/13/15 1642 01/13/15 1644 01/13/15 1854  Weight: 94.802 kg (209 lb) 97.523 kg (215 lb) 88 kg (194 lb 0.1 oz)    Exam:   General:  Appear in no acute distress  Cardiovascular: S1S2 RRR  Respiratory: Clear bilaterally  Abdomen: Soft, nontender  Musculoskeletal: No edema of the lower extremities  Data Reviewed: Basic Metabolic Panel:  Recent Labs Lab 01/13/15 1500 01/14/15 0237  NA 136 139  K 2.5* 2.8*  CL 99* 108  CO2 25 21*  GLUCOSE 140* 126*  BUN 15 15  CREATININE 2.51* 2.58*  CALCIUM 8.6* 7.8*   Liver Function Tests:  Recent Labs Lab 01/13/15 1500  AST 410*  ALT 118*  ALKPHOS 75  BILITOT 1.1  PROT 6.7  ALBUMIN 3.8   No results for input(s): LIPASE, AMYLASE in the last 168 hours. No results for input(s): AMMONIA in the last 168 hours. CBC:  Recent Labs Lab 01/13/15 1500 01/14/15 0237  WBC 24.0* 26.2*  NEUTROABS 21.9*  --   HGB 14.2 12.4*  HCT 40.5 35.9*  MCV 79.9 80.7  PLT 270 224   Cardiac Enzymes:  Recent Labs Lab 01/13/15 1500 01/14/15 0237  CKTOTAL 95188* 38104*   BNP (last 3 results) No results for input(s): BNP in the last 8760 hours.  ProBNP (last 3 results) No results for input(s): PROBNP in the last 8760 hours.  CBG: No results for input(s): GLUCAP in the last  168 hours.  Recent Results (from the past 240 hour(s))  Culture, blood (routine x 2)     Status: None (Preliminary result)   Collection Time: 01/13/15  4:00 PM  Result Value Ref Range Status   Specimen Description BLOOD RIGHT ANTECUBITAL  Final   Special Requests BOTTLES DRAWN AEROBIC AND ANAEROBIC  5CC EACH  Final   Culture   Final    GRAM NEGATIVE RODS AEROBIC BOTTLE ONLY CRITICAL RESULT CALLED TO, READ BACK BY AND VERIFIED WITH: J. LINDSAY @ [place] ON [id number] BY Rhea Bleacher Performed at [facility]    Report  Status PENDING  Incomplete  MRSA PCR Screening     Status: None   Collection Time: 01/13/15 10:02 PM  Result Value Ref Range Status   MRSA by PCR NEGATIVE NEGATIVE Final    Comment:        The GeneXpert MRSA Assay (FDA approved for NASAL specimens only), is one component of a comprehensive MRSA colonization surveillance program. It is not intended to diagnose MRSA infection nor to guide or monitor treatment for MRSA infections.      Studies: Dg Chest 2 View  01/13/2015   CLINICAL DATA:  Status post fall x3 with multifocal bruising. Altered mental status today. Initial encounter.  EXAM: CHEST  2 VIEW  COMPARISON:  None.  FINDINGS: The lungs are clear. Heart size is normal. No pneumothorax or pleural effusion.  IMPRESSION: No acute disease.   Electronically Signed   By: Inge Rise M.D.   On: 01/13/2015 16:17   Ct Head Wo Contrast  01/13/2015   CLINICAL DATA:  Pain following fall  EXAM: CT HEAD WITHOUT CONTRAST  CT CERVICAL SPINE WITHOUT CONTRAST  TECHNIQUE: Multidetector CT imaging of the head and cervical spine was performed following the standard protocol without intravenous contrast. Multiplanar CT image reconstructions of the cervical spine were also generated.  COMPARISON:  None.  FINDINGS: CT HEAD FINDINGS  The ventricles are normal in size and configuration. There is no intracranial mass, hemorrhage, extra-axial fluid collection, or midline shift. Gray-white compartments are normal. No acute infarct evident. There is a right frontal scalp hematoma. The bony calvarium appears intact. The mastoid air cells are clear.  CT CERVICAL SPINE FINDINGS  The patient is status post anterior screw and plate fixation with fusion at C4 and C5. The screw and plate fixation device appears intact. There is ankylosis at C4-5.  There is no fracture or spondylolisthesis. Prevertebral soft tissues and predental space regions are normal. There is a large subchondral type cyst in the superior odontoid.  There is bony overgrowth at the predental space but no predental space widening. There are anterior osteophytes at C3, C5, C6, and C7. There is facet hypertrophy at several levels. No disc extrusion or stenosis. There is exit foraminal narrowing at C6-7 bilaterally impressing on the respective exiting nerve roots at this level bilaterally.  IMPRESSION: CT head: Right frontal scalp hematoma. No intracranial mass, hemorrhage, or extra-axial fluid collection. Gray-white compartments appear normal.  CT cervical spine: Multilevel osteoarthritic change. Postoperative change at C4 and C5. No fracture or spondylolisthesis.   Electronically Signed   By: Lowella Grip III M.D.   On: 01/13/2015 15:09   Ct Cervical Spine Wo Contrast  01/13/2015   : CT head and CT cervical spine reports are combined into a single dictation.   Electronically Signed   By: Lowella Grip III M.D.   On: 01/13/2015 15:13    Scheduled Meds: . benztropine  0.5 mg Oral q morning - 10a  . clozapine  800 mg Oral QHS  . heparin  5,000 Units Subcutaneous 3 times per day  . sodium chloride  1,000 mL Intravenous Once  . sodium chloride  3 mL Intravenous Q12H   Continuous Infusions: . sodium chloride 100 mL/hr at 01/14/15 0800    Principal Problem:   Sepsis secondary to UTI Active Problems:   Paranoid schizophrenia, chronic condition   AKI (acute kidney injury)   Rhabdomyolysis   HLD (hyperlipidemia)    Time spent: 25 min    [facility] Pager [phone]. If 7PM-7AM, please contact night-coverage at [email or web address], password [facility] 01/14/2015, 11:43 AM  LOS: 1 day

## 2015-01-15 DIAGNOSIS — E785 Hyperlipidemia, unspecified: Secondary | ICD-10-CM

## 2015-01-15 LAB — CBC
HCT: 37.4 % — ABNORMAL LOW (ref 39.0–52.0)
HEMOGLOBIN: 12.8 g/dL — AB (ref 13.0–17.0)
MCH: 27.8 pg (ref 26.0–34.0)
MCHC: 34.2 g/dL (ref 30.0–36.0)
MCV: 81.1 fL (ref 78.0–100.0)
Platelets: 251 10*3/uL (ref 150–400)
RBC: 4.61 MIL/uL (ref 4.22–5.81)
RDW: 15 % (ref 11.5–15.5)
WBC: 19.7 10*3/uL — AB (ref 4.0–10.5)

## 2015-01-15 LAB — COMPREHENSIVE METABOLIC PANEL
ALT: 122 U/L — ABNORMAL HIGH (ref 17–63)
ANION GAP: 12 (ref 5–15)
AST: 266 U/L — AB (ref 15–41)
Albumin: 2.7 g/dL — ABNORMAL LOW (ref 3.5–5.0)
Alkaline Phosphatase: 78 U/L (ref 38–126)
BILIRUBIN TOTAL: 1 mg/dL (ref 0.3–1.2)
BUN: 19 mg/dL (ref 6–20)
CALCIUM: 8 mg/dL — AB (ref 8.9–10.3)
CHLORIDE: 115 mmol/L — AB (ref 101–111)
CO2: 19 mmol/L — ABNORMAL LOW (ref 22–32)
CREATININE: 2.61 mg/dL — AB (ref 0.61–1.24)
GFR calc Af Amer: 30 mL/min — ABNORMAL LOW (ref >60)
GFR, EST NON AFRICAN AMERICAN: 26 mL/min — AB (ref >60)
Glucose, Bld: 122 mg/dL — ABNORMAL HIGH (ref 65–99)
POTASSIUM: 2.9 mmol/L — AB (ref 3.5–5.1)
SODIUM: 146 mmol/L — AB (ref 135–145)
Total Protein: 5.8 g/dL — ABNORMAL LOW (ref 6.5–8.1)

## 2015-01-15 LAB — URINALYSIS, ROUTINE W REFLEX MICROSCOPIC
Bilirubin Urine: NEGATIVE
Glucose, UA: NEGATIVE mg/dL
Ketones, ur: NEGATIVE mg/dL
Nitrite: NEGATIVE
PH: 5.5 (ref 5.0–8.0)
PROTEIN: 100 mg/dL — AB
Specific Gravity, Urine: 1.01 (ref 1.005–1.030)
UROBILINOGEN UA: 0.2 mg/dL (ref 0.0–1.0)

## 2015-01-15 LAB — URINE MICROSCOPIC-ADD ON

## 2015-01-15 LAB — CK: CK TOTAL: 21690 U/L — AB (ref 49–397)

## 2015-01-15 MED ORDER — POTASSIUM CHLORIDE 10 MEQ/100ML IV SOLN
10.0000 meq | INTRAVENOUS | Status: AC
  Administered 2015-01-15 (×3): 10 meq via INTRAVENOUS
  Filled 2015-01-15 (×3): qty 100

## 2015-01-15 NOTE — Progress Notes (Addendum)
[facility] PROGRESS NOTE  Edwin [name] MEQ:[id number] DOB: 01/27/1956 DOA: 01/13/2015 PCP: No primary care provider on file.  Assessment/Plan: 1. Sepsis due to UTI- patient initially started on vancomycin and Zosyn, was later changed to Rocephin. Blood cultures growing gram-negative rods, patient restarted on Zosyn. Final culture and sensitivity are pending. 2. Encephalopathy- patient is confused has a history of paranoid schizophrenia and now has UTI/sepsis which is contributing to the consfusion. Confusion has mildly improved, still trying to remove IV and lines. Will get a sitter at bedside. 3. Rhabdomyolysis- patient started on IV fluids normal saline at 150 mL per hour. [place] [address], also developing mild acidosis with CO2 19, creatinine 2.61. Will obtain nephrology consultation for further management.  check CK in a.m. 4. Acute kidney injury- patient's creatinine is 2. 61 today, baseline is unknown at this time. Will continue with IV fluids. Recheck BMP in a.m. nephrology consultation 5. Transaminitis- slowly improving patient presented with elevated AST and ALT level likely from the rhabdomyolysis. AST 410---266, ALT 118--122. 6. Hypokalemia- replace potassium and check BMP in a.m. 7. Hyperlipidemia- patient was taking statin at home which has been discontinued this time due to the rhabdomyolysis 8. Paranoid schizophrenia- psychotropic  medications on hold as patient is unable to take  by mouth. 9.  Code Status: Full code Family Communication: *No family at bedside Disposition Plan: Home when medically stable   Consultants:  None  Procedures:  None  Antibiotics:  None  HPI/Subjective: 59 y.o. male h/o paranoid schizophrenia, found down at his home by brother who went to check up on him after he wasn't answering phone for a couple of days. Patient was found on floor at home, awake and responsive. Normally takes care of self without trouble. Patient admits to  2-3 day history of pain and burning on urination  Patient is more alert and communicative this morning, though intermittently confused.  Objective: Filed Vitals:   01/15/15 0822  BP:   Pulse:   Temp: 100 F (37.8 C)  Resp:     Intake/Output Summary (Last 24 hours) at 01/15/15 1105 Last data filed at 01/15/15 0826  Gross per 24 hour  Intake   4970 ml  Output   2400 ml  Net   2570 ml   Filed Weights   01/13/15 1642 01/13/15 1644 01/13/15 1854  Weight: 94.802 kg (209 lb) 97.523 kg (215 lb) 88 kg (194 lb 0.1 oz)    Exam:   General:  Appear in no acute distress  Cardiovascular: S1S2 RRR  Respiratory: Clear bilaterally  Abdomen: Soft, nontender  Musculoskeletal: No edema of the lower extremities  Data Reviewed: Basic Metabolic Panel:  Recent Labs Lab 01/13/15 1500 01/14/15 0237 01/15/15 0241  NA 136 139 146*  K 2.5* 2.8* 2.9*  CL 99* 108 115*  CO2 25 21* 19*  GLUCOSE 140* 126* 122*  BUN 15 15 19   CREATININE 2.51* 2.58* 2.61*  CALCIUM 8.6* 7.8* 8.0*   Liver Function Tests:  Recent Labs Lab 01/13/15 1500 01/15/15 0241  AST 410* 266*  ALT 118* 122*  ALKPHOS 75 78  BILITOT 1.1 1.0  PROT 6.7 5.8*  ALBUMIN 3.8 2.7*   No results for input(s): LIPASE, AMYLASE in the last 168 hours. No results for input(s): AMMONIA in the last 168 hours. CBC:  Recent Labs Lab 01/13/15 1500 01/14/15 0237 01/15/15 0241  WBC 24.0* 26.2* 19.7*  NEUTROABS 21.9*  --   --   HGB 14.2 12.4* 12.8*  HCT  40.5 35.9* 37.4*  MCV 79.9 80.7 81.1  PLT 270 224 251   Cardiac Enzymes:  Recent Labs Lab 01/13/15 1500 01/14/15 0237 01/15/15 0241  CKTOTAL 38756* 38104* 21690*   BNP (last 3 results) No results for input(s): BNP in the last 8760 hours.  ProBNP (last 3 results) No results for input(s): PROBNP in the last 8760 hours.  CBG: No results for input(s): GLUCAP in the last 168 hours.  Recent Results (from the past 240 hour(s))  Urine culture     Status: None  (Preliminary result)   Collection Time: 01/13/15  3:30 PM  Result Value Ref Range Status   Specimen Description URINE, CLEAN CATCH  Final   Special Requests NONE  Final   Culture   Final    TOO YOUNG TO READ Performed at [facility]    Report Status PENDING  Incomplete  Culture, blood (routine x 2)     Status: None (Preliminary result)   Collection Time: 01/13/15  4:00 PM  Result Value Ref Range Status   Specimen Description BLOOD RIGHT ANTECUBITAL  Final   Special Requests BOTTLES DRAWN AEROBIC AND ANAEROBIC  5CC EACH  Final   Culture  Setup Time   Final    GRAM NEGATIVE RODS AEROBIC BOTTLE ONLY CRITICAL RESULT CALLED TO, READ BACK BY AND VERIFIED WITH: J. LINDSAY @ [address] ON [id number] BY Rhea Bleacher    Culture   Final    GRAM NEGATIVE RODS Performed at [facility]    Report Status PENDING  Incomplete  Culture, blood (routine x 2)     Status: None (Preliminary result)   Collection Time: 01/13/15  4:05 PM  Result Value Ref Range Status   Specimen Description BLOOD LEFT FOREARM  Final   Special Requests BOTTLES DRAWN AEROBIC AND ANAEROBIC 5CC EACH  Final   Culture   Final    NO GROWTH < 24 HOURS Performed at [facility]    Report Status PENDING  Incomplete  MRSA PCR Screening     Status: None   Collection Time: 01/13/15 10:02 PM  Result Value Ref Range Status   MRSA by PCR NEGATIVE NEGATIVE Final    Comment:        The GeneXpert MRSA Assay (FDA approved for NASAL specimens only), is one component of a comprehensive MRSA colonization surveillance program. It is not intended to diagnose MRSA infection nor to guide or monitor treatment for MRSA infections.      Studies: Dg Chest 2 View  01/13/2015   CLINICAL DATA:  Status post fall x3 with multifocal bruising. Altered mental status today. Initial encounter.  EXAM: CHEST  2 VIEW  COMPARISON:  None.  FINDINGS: The lungs are clear. Heart size is normal. No pneumothorax or pleural effusion.   IMPRESSION: No acute disease.   Electronically Signed   By: Inge Rise M.D.   On: 01/13/2015 16:17   Ct Head Wo Contrast  01/13/2015   CLINICAL DATA:  Pain following fall  EXAM: CT HEAD WITHOUT CONTRAST  CT CERVICAL SPINE WITHOUT CONTRAST  TECHNIQUE: Multidetector CT imaging of the head and cervical spine was performed following the standard protocol without intravenous contrast. Multiplanar CT image reconstructions of the cervical spine were also generated.  COMPARISON:  None.  FINDINGS: CT HEAD FINDINGS  The ventricles are normal in size and configuration. There is no intracranial mass, hemorrhage, extra-axial fluid collection, or midline shift. Gray-white compartments are normal. No acute infarct evident. There is a right frontal  scalp hematoma. The bony calvarium appears intact. The mastoid air cells are clear.  CT CERVICAL SPINE FINDINGS  The patient is status post anterior screw and plate fixation with fusion at C4 and C5. The screw and plate fixation device appears intact. There is ankylosis at C4-5.  There is no fracture or spondylolisthesis. Prevertebral soft tissues and predental space regions are normal. There is a large subchondral type cyst in the superior odontoid. There is bony overgrowth at the predental space but no predental space widening. There are anterior osteophytes at C3, C5, C6, and C7. There is facet hypertrophy at several levels. No disc extrusion or stenosis. There is exit foraminal narrowing at C6-7 bilaterally impressing on the respective exiting nerve roots at this level bilaterally.  IMPRESSION: CT head: Right frontal scalp hematoma. No intracranial mass, hemorrhage, or extra-axial fluid collection. Gray-white compartments appear normal.  CT cervical spine: Multilevel osteoarthritic change. Postoperative change at C4 and C5. No fracture or spondylolisthesis.   Electronically Signed   By: Lowella Grip III M.D.   On: 01/13/2015 15:09   Ct Cervical Spine Wo  Contrast  01/13/2015   : CT head and CT cervical spine reports are combined into a single dictation.   Electronically Signed   By: Lowella Grip III M.D.   On: 01/13/2015 15:13    Scheduled Meds: . benztropine  0.5 mg Oral q morning - 10a  . clozapine  800 mg Oral QHS  . heparin  5,000 Units Subcutaneous 3 times per day  . piperacillin-tazobactam (ZOSYN)  IV  3.375 g Intravenous 3 times per day  . sodium chloride  1,000 mL Intravenous Once  . sodium chloride  3 mL Intravenous Q12H   Continuous Infusions: . sodium chloride 150 mL/hr at 01/15/15 0155    Principal Problem:   Sepsis secondary to UTI Active Problems:   Paranoid schizophrenia, chronic condition   AKI (acute kidney injury)   Rhabdomyolysis   HLD (hyperlipidemia)    Time spent: 25 min    [facility] Pager [phone]. If 7PM-7AM, please contact night-coverage at [email or web address], password [facility] 01/15/2015, 11:05 AM  LOS: 2 days

## 2015-01-15 NOTE — Consult Note (Signed)
Renal Service Consult Note [facility]  Edwin [name] 01/15/2015 [place] [facility] Requesting Physician:  Dr Darrick Meigs  Reason for Consult:  Renal failure and rhabdomyolysis HPI: The patient is a 59 y.o. year-old with hx of schizophrenia found down at home by his brother after being out of touch for a few days.  Admitted on 6/16 with AKI, ^^ CPK and probabe UTI. High fevers 102 have improved since admission and WBC down to 19k today. Pt is confused in restraints. Creat has been up at 2.5 on admission and 2.61 today. No old labs in system.  Renal US pending. Na high at 146 and K low 2.9.    Patient is very confused and doesn't provide much history. Says he doesn't usually see doctors and that he has no medical problems.   ROS   denies CP, sob  no n/v/d  no abd pain  no joint pain or rash  Past Medical History  Past Medical History  Diagnosis Date  . Paranoid schizophrenia    Past Surgical History  Past Surgical History  Procedure Laterality Date  . Neck surgery     Family History History reviewed. No pertinent family history. Social History  reports that he has been smoking Cigarettes.  He has been smoking about 1.00 pack per day. He does not have any smokeless tobacco history on file. He reports that he does not drink alcohol or use illicit drugs. Allergies No Known Allergies Home medications Prior to Admission medications   Medication Sig Start Date End Date Taking? Authorizing Provider  benztropine (COGENTIN) 0.5 MG tablet Take 0.5 mg by mouth every morning. 12/13/14  Yes Historical Provider, MD  clozapine (CLOZARIL) 200 MG tablet Take 800 mg by mouth at bedtime. 12/10/14  Yes Historical Provider, MD  simvastatin (ZOCOR) 20 MG tablet Take 20 mg by mouth daily. 10/10/14  Yes Historical Provider, MD   Liver Function Tests  Recent Labs Lab 01/13/15 1500 01/15/15 0241  AST 410* 266*  ALT 118* 122*  ALKPHOS 75 78  BILITOT 1.1 1.0  PROT 6.7 5.8*  ALBUMIN 3.8 2.7*    No results for input(s): LIPASE, AMYLASE in the last 168 hours. CBC  Recent Labs Lab 01/13/15 1500 01/14/15 0237 01/15/15 0241  WBC 24.0* 26.2* 19.7*  NEUTROABS 21.9*  --   --   HGB 14.2 12.4* 12.8*  HCT 40.5 35.9* 37.4*  MCV 79.9 80.7 81.1  PLT 270 224 710   Basic Metabolic Panel  Recent Labs Lab 01/13/15 1500 01/14/15 0237 01/15/15 0241  NA 136 139 146*  K 2.5* 2.8* 2.9*  CL 99* 108 115*  CO2 25 21* 19*  GLUCOSE 140* 126* 122*  BUN 15 15 19   CREATININE 2.51* 2.58* 2.61*  CALCIUM 8.6* 7.8* 8.0*    Filed Vitals:   01/15/15 0822 01/15/15 1000 01/15/15 1200 01/15/15 1233  BP:  123/93 121/77   Pulse:  102 93   Temp: 100 F (37.8 C)   99.2 F (37.3 C)  TempSrc: Oral   Oral  Resp:  22 35   Height:      Weight:      SpO2:  94% 96%    Exam Alert, disheveled, no distress, in posey vest No rash, cyanosis or gangrene Sclera anicteric, throat clear and slightly dry NO jvd Chest is clear bilat RRR no MRG Abd is soft ntnd no mrg no mass ?ascites No LE or UE edema GU normal male w condom cath  No joint effusions/ deformities Neuro  is confused, disoriented, alert and nonfocal  UA (undersigned) > UA - cloudy, many bact, lrg Hb , pH6.0, protein 30, 11-20 rbc and 21-50 wbc/hpf CXR negative  Na 129 > 146 K 2.9  BUN 19 Creat 2.61 Ca 8.0, glucose 122  Alb 2.7 AST 266, ALT 122 WBC 26 > 19k  Hb 12.8   plt 251    Assessment: 1. Renal failure - acute and/or CRF.  For now continue fluids, looks euvolemic. Get renal US, will examine sediment and make further suggestions. 2. AMS 3. Rhabdomyolysis 4. UTI cx pending 5. Schizophrenia    Plan- as above, will follow.   Kelly Splinter MD (pgr) [phone]    [phone] 01/15/2015, 4:08 PM

## 2015-01-16 ENCOUNTER — Inpatient Hospital Stay (HOSPITAL_COMMUNITY): Payer: Medicare Other

## 2015-01-16 LAB — COMPREHENSIVE METABOLIC PANEL
ALBUMIN: 2.4 g/dL — AB (ref 3.5–5.0)
ALK PHOS: 79 U/L (ref 38–126)
ALT: 128 U/L — ABNORMAL HIGH (ref 17–63)
AST: 204 U/L — AB (ref 15–41)
Anion gap: 8 (ref 5–15)
BUN: 19 mg/dL (ref 6–20)
CALCIUM: 8.1 mg/dL — AB (ref 8.9–10.3)
CO2: 22 mmol/L (ref 22–32)
CREATININE: 2.31 mg/dL — AB (ref 0.61–1.24)
Chloride: 117 mmol/L — ABNORMAL HIGH (ref 101–111)
GFR calc Af Amer: 35 mL/min — ABNORMAL LOW (ref >60)
GFR calc non Af Amer: 30 mL/min — ABNORMAL LOW (ref >60)
Glucose, Bld: 98 mg/dL (ref 65–99)
Potassium: 3.1 mmol/L — ABNORMAL LOW (ref 3.5–5.1)
SODIUM: 147 mmol/L — AB (ref 135–145)
Total Bilirubin: 0.6 mg/dL (ref 0.3–1.2)
Total Protein: 5.6 g/dL — ABNORMAL LOW (ref 6.5–8.1)

## 2015-01-16 LAB — CK: CK TOTAL: 11101 U/L — AB (ref 49–397)

## 2015-01-16 MED ORDER — TAMSULOSIN HCL 0.4 MG PO CAPS
0.4000 mg | ORAL_CAPSULE | Freq: Every day | ORAL | Status: DC
  Administered 2015-01-16 – 2015-01-20 (×5): 0.4 mg via ORAL
  Filled 2015-01-16 (×6): qty 1

## 2015-01-16 MED ORDER — POTASSIUM CHLORIDE 10 MEQ/100ML IV SOLN
10.0000 meq | INTRAVENOUS | Status: AC
  Administered 2015-01-16 (×3): 10 meq via INTRAVENOUS
  Filled 2015-01-16 (×3): qty 100

## 2015-01-16 MED ORDER — DEXTROSE 5 % IV SOLN
1.0000 g | Freq: Two times a day (BID) | INTRAVENOUS | Status: DC
  Administered 2015-01-16 – 2015-01-17 (×3): 1 g via INTRAVENOUS
  Filled 2015-01-16 (×4): qty 1

## 2015-01-16 MED ORDER — DEXTROSE 5 % IV SOLN
INTRAVENOUS | Status: DC
  Administered 2015-01-16 – 2015-01-18 (×6): via INTRAVENOUS

## 2015-01-16 NOTE — Progress Notes (Signed)
Patient having dark red urine with clots output is good.  VSS with no s/s of distress noted.  Updated Dr. Baltazar Najjar received no new orders at this time.

## 2015-01-16 NOTE — Progress Notes (Signed)
  [facility] Progress Note   Subjective: Creat down 2.3, UOP good, CPK down 11K  Filed Vitals:   01/15/15 1645 01/15/15 2131 01/16/15 0025 01/16/15 0443  BP: 125/72 132/78 130/80 113/60  Pulse: 95 100 94 96  Temp:  98.5 F (36.9 C) 98.9 F (37.2 C) 98.7 F (37.1 C)  TempSrc:  Oral Oral Oral  Resp: 34 24 27 32  Height:      Weight:    88.1 kg (194 lb 3.6 oz)  SpO2: 93% 95% 95% 96%   Exam: Wants a cigarette No distress, eating breakfast, disheveled No jvd Chest clear bilat RRR no MRG Abd is soft ntnd no mrg no mass No LE or UE edema GU normal male w condom cath  Neuro more alert, mumbles, nonfocal  UA (undersigned) > sheets of WBC's few rbc's no casts UA - cloudy, many bact, lrg Hb , pH6.0, protein 30, 11-20 rbc and 21-50 wbc/hpf CXR negative  Na 147  Renal US > severe bilat hydro with bladder distension    Assessment: 1. Renal failure - has bladder outlet obstruction and bilat hydro by [place]. Foley placed this am and drained 1300 cc immediately . Obstruction is main issue, urine sediment doesn't support ATN (no gran casts) but does support significant UTI which is "complicated" in the setting of obstruction. Will need urology input.\ 2. Hypernatremia - adjusted fluids to D5W at 150/ hr and reduce NS to 50/hr 3. Rhabdomyolysis improving 4. Fevers/ bacteremia / pyuria - prob urosepsis from bladder outlet obtruction. Prostate most likely culprit.   5. Schizophrenia  Plan - Will sign off, please call as needed.      Kelly Splinter MD  pager [phone]    cell [phone]  01/16/2015, 10:09 AM     Recent Labs Lab 01/14/15 0237 01/15/15 0241 01/16/15 0225  NA 139 146* 147*  K 2.8* 2.9* 3.1*  CL 108 115* 117*  CO2 21* 19* 22  GLUCOSE 126* 122* 98  BUN 15 19 19   CREATININE 2.58* 2.61* 2.31*  CALCIUM 7.8* 8.0* 8.1*    Recent Labs Lab 01/13/15 1500 01/15/15 0241 01/16/15 0225  AST 410* 266* 204*  ALT 118* 122* 128*  ALKPHOS 75 78 79   BILITOT 1.1 1.0 0.6  PROT 6.7 5.8* 5.6*  ALBUMIN 3.8 2.7* 2.4*    Recent Labs Lab 01/13/15 1500 01/14/15 0237 01/15/15 0241  WBC 24.0* 26.2* 19.7*  NEUTROABS 21.9*  --   --   HGB 14.2 12.4* 12.8*  HCT 40.5 35.9* 37.4*  MCV 79.9 80.7 81.1  PLT 270 224 251   . benztropine  0.5 mg Oral q morning - 10a  . clozapine  800 mg Oral QHS  . heparin  5,000 Units Subcutaneous 3 times per day  . piperacillin-tazobactam (ZOSYN)  IV  3.375 g Intravenous 3 times per day  . potassium chloride  10 mEq Intravenous Q1 Hr x 3  . sodium chloride  1,000 mL Intravenous Once  . sodium chloride  3 mL Intravenous Q12H   . sodium chloride 50 mL/hr at 01/16/15 0938  . dextrose 150 mL/hr at 01/16/15 [phone]

## 2015-01-16 NOTE — Progress Notes (Signed)
ANTIBIOTIC CONSULT NOTE - INITIAL  Pharmacy Consult for [place] Indication: Sepsis 2/2 UTI  No Known Allergies  Patient Measurements: Height: 5\' 11"  (180.3 cm) Weight: 194 lb 3.6 oz (88.1 kg) IBW/kg (Calculated) : 75.3   Vital Signs: Temp: 98.7 F (37.1 C) (06/19 0443) Temp Source: Oral (06/19 0443) BP: 131/81 mmHg (06/19 1050) Pulse Rate: 94 (06/19 1050) Intake/Output from previous day: 06/18 0701 - 06/19 0700 In: 3737.5 [I.V.:3637.5; IV Piggyback:100] Out: 900 [Urine:900] Intake/Output from this shift: Total I/O In: 700 [I.V.:700] Out: 1900 [Urine:1900]  Labs:  Recent Labs  01/13/15 1500 01/14/15 0237 01/15/15 0241 01/16/15 0225  WBC 24.0* 26.2* 19.7*  --   HGB 14.2 12.4* 12.8*  --   PLT 270 224 251  --   CREATININE 2.51* 2.58* 2.61* 2.31*   Estimated Creatinine Clearance: 38 mL/min (by C-G formula based on Cr of 2.31). No results for input(s): VANCOTROUGH, VANCOPEAK, VANCORANDOM, GENTTROUGH, GENTPEAK, GENTRANDOM, TOBRATROUGH, TOBRAPEAK, TOBRARND, AMIKACINPEAK, AMIKACINTROU, AMIKACIN in the last 72 hours.   Microbiology: Recent Results (from the past 720 hour(s))  Urine culture     Status: None (Preliminary result)   Collection Time: 01/13/15  3:30 PM  Result Value Ref Range Status   Specimen Description URINE, CLEAN CATCH  Final   Special Requests NONE  Final   Culture   Final    CULTURE REINCUBATED FOR BETTER GROWTH Performed at [facility]    Report Status PENDING  Incomplete  Culture, blood (routine x 2)     Status: None (Preliminary result)   Collection Time: 01/13/15  4:00 PM  Result Value Ref Range Status   Specimen Description BLOOD RIGHT ANTECUBITAL  Final   Special Requests BOTTLES DRAWN AEROBIC AND ANAEROBIC  5CC EACH  Final   Culture  Setup Time   Final    GRAM NEGATIVE RODS AEROBIC BOTTLE ONLY CRITICAL RESULT CALLED TO, READ BACK BY AND VERIFIED WITH: J. LINDSAY @ 0836 ON [id number] BY Rhea Bleacher    Culture   Final   ACINETOBACTER CALCOACETICUS/BAUMANNII COMPLEX Performed at [facility]    Report Status PENDING  Incomplete   Organism ID, Bacteria ACINETOBACTER CALCOACETICUS/BAUMANNII COMPLEX  Final      Susceptibility   Acinetobacter calcoaceticus/baumannii complex - MIC*    CEFTAZIDIME 4 SENSITIVE Sensitive     CEFTRIAXONE 16 INTERMEDIATE Intermediate     CIPROFLOXACIN <=0.25 SENSITIVE Sensitive     GENTAMICIN <=1 SENSITIVE Sensitive     IMIPENEM <=0.25 SENSITIVE Sensitive     PIP/TAZO <=4 SENSITIVE Sensitive     TRIMETH/SULFA <=20 SENSITIVE Sensitive     AMPICILLIN/SULBACTAM <=2 SENSITIVE Sensitive     * ACINETOBACTER CALCOACETICUS/BAUMANNII COMPLEX  Culture, blood (routine x 2)     Status: None (Preliminary result)   Collection Time: 01/13/15  4:05 PM  Result Value Ref Range Status   Specimen Description BLOOD LEFT FOREARM  Final   Special Requests BOTTLES DRAWN AEROBIC AND ANAEROBIC 5CC EACH  Final   Culture   Final    NO GROWTH 2 DAYS Performed at [facility]    Report Status PENDING  Incomplete  MRSA PCR Screening     Status: None   Collection Time: 01/13/15 10:02 PM  Result Value Ref Range Status   MRSA by PCR NEGATIVE NEGATIVE Final    Comment:        The GeneXpert MRSA Assay (FDA approved for NASAL specimens only), is one component of a comprehensive MRSA colonization surveillance program. It is not intended to  diagnose MRSA infection nor to guide or monitor treatment for MRSA infections.     Medical History: Past Medical History  Diagnosis Date  . Paranoid schizophrenia     Medications:  Scheduled:  . benztropine  0.5 mg Oral q morning - 10a  . cefTAZidime (FORTAZ)  IV  1 g Intravenous Q12H  . clozapine  800 mg Oral QHS  . heparin  5,000 Units Subcutaneous 3 times per day  . sodium chloride  1,000 mL Intravenous Once  . sodium chloride  3 mL Intravenous Q12H   Infusions:  . sodium chloride 50 mL/hr at 01/16/15 0938  . dextrose 150 mL/hr at  01/16/15 9476   Assessment: CC: Edwin [name] is an 59 y.o. male admitted on 01/13/2015 presenting from a fall found on floor awake and responsive. Patient placed on empiric antibiotics for sepsis 2/2 to UTI.  WBC up trending down, ANC 21.9  CTX  6/16 >> 6/17 Zosyn 6/17>>6/19 Fortaz 6/19 >>  6/16  UC >> 6/16 BC >> GNR Acinetobacter (Pan Sensitive)  AKI - SCr 2.61, CrCl ~30-35 ml/min, UO 0.4, Rhabdo CK improving.  Plan:  - Change Zosyn to Fortaz 1 g IV Q12H - Monitor renal function - Monitor for clinical efficacy.  Consider streamlining to oral therapy if patient continues to improve.    Hassie Bruce, Pharm. D. Clinical Pharmacy Resident Pager: [phone] Ph: [phone] 01/16/2015 12:10 PM

## 2015-01-16 NOTE — Progress Notes (Addendum)
[facility] PROGRESS NOTE  Edwin [name] SWN:[id number] DOB: 1955-11-16 DOA: 01/13/2015 PCP: No primary care provider on file.  Assessment/Plan: 1. Sepsis due to UTI- patient initially started on vancomycin and Zosyn, was later changed to Rocephin. Blood cultures growing gram-negative rods, patient restarted on Zosyn. Final blood culture shows ACINETOBACTER CALCOACETICUS/BAUMANNII COMPLEX sensitive to Zosyn, antibiotic changed to Ceftazidime  for narrowing the antibiotic coverage. 2. Encephalopathy- patient is confused has a history of paranoid schizophrenia and now has UTI/sepsis which is contributing to the consfusion. Confusion has mildly improved, still trying to remove IV and lines. Will get a sitter at bedside. 3. Rhabdomyolysis- patient started on IV fluids normal saline at 150 mL per hour. [place] [address], nephrology was consulted. 4. Acute kidney injury- patient's creatinine is 2.31, nephrology was consulted. A renal ultrasound was done which showed severe bilateral hydronephrosis and distended bladder suggesting bladder outlet obstruction. Nephrology has signed off, will get urology consultation. 5. Transaminitis- slowly improving patient presented with elevated AST and ALT level likely from the rhabdomyolysis. AST 410---266- -204, ALT 118--122.--128 6. Hypokalemia- potassiums to 3.1 despite replacement. Will replace potassium and check BMP in a.m. 7. Hyperlipidemia- patient was taking statin at home which has been discontinued this time due to the rhabdomyolysis 8. Paranoid schizophrenia- psychotropic  medications on hold as patient is unable to take  by mouth. 9. DVT prophylaxis- heparin  Code Status: Full code Family Communication: *No family at bedside Disposition Plan: Home when medically stable   Consultants:  None  Procedures:  None  Antibiotics:  None  HPI/Subjective: 59 y.o. male h/o paranoid schizophrenia, found down at his home by brother who went to  check up on him after he wasn't answering phone for a couple of days. Patient was found on floor at home, awake and responsive. Normally takes care of self without trouble. Patient admits to 2-3 day history of pain and burning on urination  Patient is more alert and communicative this morning, though intermittently confused.  Objective: Filed Vitals:   01/16/15 1158  BP: 133/83  Pulse: 88  Temp:   Resp: 21    Intake/Output Summary (Last 24 hours) at 01/16/15 1253 Last data filed at 01/16/15 1200  Gross per 24 hour  Intake 3737.5 ml  Output   2400 ml  Net 1337.5 ml   Filed Weights   01/13/15 1644 01/13/15 1854 01/16/15 0443  Weight: 97.523 kg (215 lb) 88 kg (194 lb 0.1 oz) 88.1 kg (194 lb 3.6 oz)    Exam:   General:  Appear in no acute distress  Cardiovascular: S1S2 RRR  Respiratory: Clear bilaterally  Abdomen: Soft, nontender  Musculoskeletal: No edema of the lower extremities  Data Reviewed: Basic Metabolic Panel:  Recent Labs Lab 01/13/15 1500 01/14/15 0237 01/15/15 0241 01/16/15 0225  NA 136 139 146* 147*  K 2.5* 2.8* 2.9* 3.1*  CL 99* 108 115* 117*  CO2 25 21* 19* 22  GLUCOSE 140* 126* 122* 98  BUN 15 15 19 19   CREATININE 2.51* 2.58* 2.61* 2.31*  CALCIUM 8.6* 7.8* 8.0* 8.1*   Liver Function Tests:  Recent Labs Lab 01/13/15 1500 01/15/15 0241 01/16/15 0225  AST 410* 266* 204*  ALT 118* 122* 128*  ALKPHOS 75 78 79  BILITOT 1.1 1.0 0.6  PROT 6.7 5.8* 5.6*  ALBUMIN 3.8 2.7* 2.4*   No results for input(s): LIPASE, AMYLASE in the last 168 hours. No results for input(s): AMMONIA in the last 168 hours. CBC:  Recent Labs  Lab 01/13/15 1500 01/14/15 0237 01/15/15 0241  WBC 24.0* 26.2* 19.7*  NEUTROABS 21.9*  --   --   HGB 14.2 12.4* 12.8*  HCT 40.5 35.9* 37.4*  MCV 79.9 80.7 81.1  PLT 270 224 251   Cardiac Enzymes:  Recent Labs Lab 01/13/15 1500 01/14/15 0237 01/15/15 0241 01/16/15 0225  CKTOTAL 31517* 61607* 21690* 11101*    BNP (last 3 results) No results for input(s): BNP in the last 8760 hours.  ProBNP (last 3 results) No results for input(s): PROBNP in the last 8760 hours.  CBG: No results for input(s): GLUCAP in the last 168 hours.  Recent Results (from the past 240 hour(s))  Urine culture     Status: None (Preliminary result)   Collection Time: 01/13/15  3:30 PM  Result Value Ref Range Status   Specimen Description URINE, CLEAN CATCH  Final   Special Requests NONE  Final   Culture   Final    CULTURE REINCUBATED FOR BETTER GROWTH Performed at [facility]    Report Status PENDING  Incomplete  Culture, blood (routine x 2)     Status: None (Preliminary result)   Collection Time: 01/13/15  4:00 PM  Result Value Ref Range Status   Specimen Description BLOOD RIGHT ANTECUBITAL  Final   Special Requests BOTTLES DRAWN AEROBIC AND ANAEROBIC  5CC EACH  Final   Culture  Setup Time   Final    GRAM NEGATIVE RODS AEROBIC BOTTLE ONLY CRITICAL RESULT CALLED TO, READ BACK BY AND VERIFIED WITH: J. LINDSAY @ 0836 ON [id number] BY Rhea Bleacher    Culture   Final    ACINETOBACTER CALCOACETICUS/BAUMANNII COMPLEX Performed at [facility]    Report Status PENDING  Incomplete   Organism ID, Bacteria ACINETOBACTER CALCOACETICUS/BAUMANNII COMPLEX  Final      Susceptibility   Acinetobacter calcoaceticus/baumannii complex - MIC*    CEFTAZIDIME 4 SENSITIVE Sensitive     CEFTRIAXONE 16 INTERMEDIATE Intermediate     CIPROFLOXACIN <=0.25 SENSITIVE Sensitive     GENTAMICIN <=1 SENSITIVE Sensitive     IMIPENEM <=0.25 SENSITIVE Sensitive     PIP/TAZO <=4 SENSITIVE Sensitive     TRIMETH/SULFA <=20 SENSITIVE Sensitive     AMPICILLIN/SULBACTAM <=2 SENSITIVE Sensitive     * ACINETOBACTER CALCOACETICUS/BAUMANNII COMPLEX  Culture, blood (routine x 2)     Status: None (Preliminary result)   Collection Time: 01/13/15  4:05 PM  Result Value Ref Range Status   Specimen Description BLOOD LEFT FOREARM  Final    Special Requests BOTTLES DRAWN AEROBIC AND ANAEROBIC 5CC EACH  Final   Culture   Final    NO GROWTH 3 DAYS Performed at [facility]    Report Status PENDING  Incomplete  MRSA PCR Screening     Status: None   Collection Time: 01/13/15 10:02 PM  Result Value Ref Range Status   MRSA by PCR NEGATIVE NEGATIVE Final    Comment:        The GeneXpert MRSA Assay (FDA approved for NASAL specimens only), is one component of a comprehensive MRSA colonization surveillance program. It is not intended to diagnose MRSA infection nor to guide or monitor treatment for MRSA infections.      Studies: US Renal  01/16/2015   CLINICAL DATA:  Renal failure.  EXAM: RENAL / URINARY TRACT ULTRASOUND COMPLETE  COMPARISON:  None.  FINDINGS: Right Kidney:  Length: 15.8 cm. There is severe hydronephrosis of the renal calices and pelvis. Proximal ureter is dilated to 2.4  cm.  Left Kidney:  Length: 16.6 cm. Severe hydronephrosis with proximal ureteral dilated to 2.4 cm.  Bladder:  Bladder is mildly distended to 760 cubic cm. Patient unable to void. Echogenic material within the bladder suggest debris.  IMPRESSION: Severe bilateral hydronephrosis and distended bladder suggests bladder outlet obstruction. Recommend Foley catheterization.  These results will be called to the ordering clinician or representative by the Radiologist Assistant, and communication documented in the PACS or zVision Dashboard.   Electronically Signed   By: Suzy Bouchard M.D.   On: 01/16/2015 08:27    Scheduled Meds: . benztropine  0.5 mg Oral q morning - 10a  . cefTAZidime (FORTAZ)  IV  1 g Intravenous Q12H  . clozapine  800 mg Oral QHS  . heparin  5,000 Units Subcutaneous 3 times per day  . sodium chloride  1,000 mL Intravenous Once  . sodium chloride  3 mL Intravenous Q12H   Continuous Infusions: . sodium chloride 50 mL/hr at 01/16/15 0938  . dextrose 150 mL/hr at 01/16/15 0865    Principal Problem:   Sepsis secondary to  UTI Active Problems:   Paranoid schizophrenia, chronic condition   AKI (acute kidney injury)   Rhabdomyolysis   HLD (hyperlipidemia)    Time spent: 25 min    [facility] Pager [phone]. If 7PM-7AM, please contact night-coverage at [email or web address], password [facility] 01/16/2015, 12:53 PM  LOS: 3 days

## 2015-01-16 NOTE — Care Management Note (Addendum)
Case Management Note  Patient Details  Name: Edwin [name] MRN: [id number] Date of Birth: 09-01-1955  Subjective/Objective:                 Pt from home alone admitted with sepsis/ aki, hx of paranoid schizoprenia.    Action/Plan: Return to home when medically stable. CM f/u with d/c needs.  Expected Discharge Date:                  Expected Discharge Plan:  Home/Self Care with [facility]  In-House Referral:     Discharge planning Services  CM Consult  Post Acute Care Choice:    Choice offered to:     DME Arranged:    DME Agency:     HH Arranged:    [place] Agency:     Status of Service:     Medicare Important Message Given:    Date Medicare IM Given:    Medicare IM give by:    Date Additional Medicare IM Given:    Additional Medicare Important Message give by:     If discussed at [place] of Stay Meetings, dates discussed:    Additional Comments:  CM spoke with brother([name]) [phone], brother states prior to admit pt lived alone and functioned independently with ADL's.   Whitman Hero [place], RN 01/16/2015, 6:38 PM

## 2015-01-16 NOTE — Consult Note (Signed)
Urology Consult   Physician requesting consult: [name]  Reason for consult: Bilateral hydronephrosis  History of Present Illness: Edwin [name] is a 59 y.o. male with hx of paranoid schizophrenia found down at home by his brother after being out of touch for a few days. Admitted on 6/16 with AKI, elevated CPK and probabe UTI. Initially with high fevers to 102 which have improved since initiation of antibiotics.  WBC down to 19k.  Nephrology following for AKI with elevated to 2.6.  Recommended a renal U/S which demonstrated bilateral hydroureteronephrosis and a distended urinary bladder.  Urine culture growing acinetobacter.  Patient is unfortunately fairly confused and unable to provide much of a history.    Past Medical History  Diagnosis Date  . Paranoid schizophrenia     Past Surgical History  Procedure Laterality Date  . Neck surgery      Medications:  Home meds:    Medication List    ASK your doctor about these medications        benztropine 0.5 MG tablet  Commonly known as:  COGENTIN  Take 0.5 mg by mouth every morning.     clozapine 200 MG tablet  Commonly known as:  CLOZARIL  Take 800 mg by mouth at bedtime.     simvastatin 20 MG tablet  Commonly known as:  ZOCOR  Take 20 mg by mouth daily.        Scheduled Meds: . benztropine  0.5 mg Oral q morning - 10a  . cefTAZidime (FORTAZ)  IV  1 g Intravenous Q12H  . clozapine  800 mg Oral QHS  . heparin  5,000 Units Subcutaneous 3 times per day  . sodium chloride  1,000 mL Intravenous Once  . sodium chloride  3 mL Intravenous Q12H   Continuous Infusions: . sodium chloride 50 mL/hr at 01/16/15 0938  . dextrose 150 mL/hr at 01/16/15 0938   PRN Meds:.  Allergies: No Known Allergies  History reviewed. No pertinent family history.  Social History:  reports that he has been smoking Cigarettes.  He has been smoking about 1.00 pack per day. He does not have any smokeless tobacco history on file. He reports  that he does not drink alcohol or use illicit drugs.  ROS: A complete review of systems was performed.  All systems are negative except for pertinent findings as noted.  Physical Exam:  Vital signs in last 24 hours: Temp:  [98.5 F (36.9 C)-99.2 F (37.3 C)] 99.2 F (37.3 C) (06/19 1307) Pulse Rate:  [88-100] 88 (06/19 1158) Resp:  [21-34] 21 (06/19 1158) BP: (113-133)/(60-83) 133/83 mmHg (06/19 1158) SpO2:  [93 %-97 %] 97 % (06/19 1158) Weight:  [194 lb 3.6 oz (88.1 kg)] 194 lb 3.6 oz (88.1 kg) (06/19 0443) Constitutional:  Alert and oriented, No acute distress Cardiovascular: Regular rate and rhythm, No JVD Respiratory: Normal respiratory effort, Lungs clear bilaterally GI: Abdomen is soft, nontender, nondistended, no abdominal masses Genitourinary: No CVAT. Normal male phallus, testes are descended bilaterally and non-tender and without masses, scrotum is normal in appearance without lesions or masses, perineum is normal on inspection. Rectal: Normal sphincter tone, no rectal masses, prostate is non tender and without nodularity. Prostate size is estimated to be 50-60 grams Lymphatic: No lymphadenopathy Neurologic: Grossly intact, no focal deficits Psychiatric: Normal mood and affect  Laboratory Data:   Recent Labs  01/13/15 1500 01/14/15 0237 01/15/15 0241  WBC 24.0* 26.2* 19.7*  HGB 14.2 12.4* 12.8*  HCT 40.5 35.9* 37.4*  PLT 270 224 251     Recent Labs  01/13/15 1500 01/14/15 0237 01/15/15 0241 01/16/15 0225  NA 136 139 146* 147*  K 2.5* 2.8* 2.9* 3.1*  CL 99* 108 115* 117*  GLUCOSE 140* 126* 122* 98  BUN 15 15 19 19   CALCIUM 8.6* 7.8* 8.0* 8.1*  CREATININE 2.51* 2.58* 2.61* 2.31*     Results for orders placed or performed during the hospital encounter of 01/13/15 (from the past 24 hour(s))  Urinalysis, Routine w reflex microscopic (not at [facility])     Status: Abnormal   Collection Time: 01/15/15  4:11 PM  Result Value Ref Range   Color, Urine AMBER (A)  YELLOW   APPearance CLOUDY (A) CLEAR   Specific Gravity, Urine 1.010 1.005 - 1.030   pH 5.5 5.0 - 8.0   Glucose, UA NEGATIVE NEGATIVE mg/dL   Hgb urine dipstick LARGE (A) NEGATIVE   Bilirubin Urine NEGATIVE NEGATIVE   Ketones, ur NEGATIVE NEGATIVE mg/dL   Protein, ur 100 (A) NEGATIVE mg/dL   Urobilinogen, UA 0.2 0.0 - 1.0 mg/dL   Nitrite NEGATIVE NEGATIVE   Leukocytes, UA LARGE (A) NEGATIVE  Urine microscopic-add on     Status: Abnormal   Collection Time: 01/15/15  4:11 PM  Result Value Ref Range   Squamous Epithelial / LPF RARE RARE   WBC, UA TOO NUMEROUS TO COUNT <3 WBC/hpf   RBC / HPF 7-10 <3 RBC/hpf   Bacteria, UA MANY (A) RARE  CK     Status: Abnormal   Collection Time: 01/16/15  2:25 AM  Result Value Ref Range   Total CK 11101 (H) 49 - 397 U/L  Comprehensive metabolic panel     Status: Abnormal   Collection Time: 01/16/15  2:25 AM  Result Value Ref Range   Sodium 147 (H) 135 - 145 mmol/L   Potassium 3.1 (L) 3.5 - 5.1 mmol/L   Chloride 117 (H) 101 - 111 mmol/L   CO2 22 22 - 32 mmol/L   Glucose, Bld 98 65 - 99 mg/dL   BUN 19 6 - 20 mg/dL   Creatinine, Ser 2.31 (H) 0.61 - 1.24 mg/dL   Calcium 8.1 (L) 8.9 - 10.3 mg/dL   Total Protein 5.6 (L) 6.5 - 8.1 g/dL   Albumin 2.4 (L) 3.5 - 5.0 g/dL   AST 204 (H) 15 - 41 U/L   ALT 128 (H) 17 - 63 U/L   Alkaline Phosphatase 79 38 - 126 U/L   Total Bilirubin 0.6 0.3 - 1.2 mg/dL   GFR calc non Af Amer 30 (L) >60 mL/min   GFR calc Af Amer 35 (L) >60 mL/min   Anion gap 8 5 - 15   Recent Results (from the past 240 hour(s))  Urine culture     Status: None (Preliminary result)   Collection Time: 01/13/15  3:30 PM  Result Value Ref Range Status   Specimen Description URINE, CLEAN CATCH  Final   Special Requests NONE  Final   Culture   Final    CULTURE REINCUBATED FOR BETTER GROWTH Performed at [facility]    Report Status PENDING  Incomplete  Culture, blood (routine x 2)     Status: None (Preliminary result)   Collection  Time: 01/13/15  4:00 PM  Result Value Ref Range Status   Specimen Description BLOOD RIGHT ANTECUBITAL  Final   Special Requests BOTTLES DRAWN AEROBIC AND ANAEROBIC  [facility] EACH  Final   Culture  Setup Time   Final  GRAM NEGATIVE RODS AEROBIC BOTTLE ONLY CRITICAL RESULT CALLED TO, READ BACK BY AND VERIFIED WITH: J. LINDSAY @ 0836 ON [id number] BY Rhea Bleacher    Culture   Final    ACINETOBACTER CALCOACETICUS/BAUMANNII COMPLEX Performed at [facility]    Report Status PENDING  Incomplete   Organism ID, Bacteria ACINETOBACTER CALCOACETICUS/BAUMANNII COMPLEX  Final      Susceptibility   Acinetobacter calcoaceticus/baumannii complex - MIC*    CEFTAZIDIME 4 SENSITIVE Sensitive     CEFTRIAXONE 16 INTERMEDIATE Intermediate     CIPROFLOXACIN <=0.25 SENSITIVE Sensitive     GENTAMICIN <=1 SENSITIVE Sensitive     IMIPENEM <=0.25 SENSITIVE Sensitive     PIP/TAZO <=4 SENSITIVE Sensitive     TRIMETH/SULFA <=20 SENSITIVE Sensitive     AMPICILLIN/SULBACTAM <=2 SENSITIVE Sensitive     * ACINETOBACTER CALCOACETICUS/BAUMANNII COMPLEX  Culture, blood (routine x 2)     Status: None (Preliminary result)   Collection Time: 01/13/15  4:05 PM  Result Value Ref Range Status   Specimen Description BLOOD LEFT FOREARM  Final   Special Requests BOTTLES DRAWN AEROBIC AND ANAEROBIC 5CC EACH  Final   Culture   Final    NO GROWTH 3 DAYS Performed at [facility]    Report Status PENDING  Incomplete  MRSA PCR Screening     Status: None   Collection Time: 01/13/15 10:02 PM  Result Value Ref Range Status   MRSA by PCR NEGATIVE NEGATIVE Final    Comment:        The GeneXpert MRSA Assay (FDA approved for NASAL specimens only), is one component of a comprehensive MRSA colonization surveillance program. It is not intended to diagnose MRSA infection nor to guide or monitor treatment for MRSA infections.     Renal Function:  Recent Labs  01/13/15 1500 01/14/15 0237 01/15/15 0241  01/16/15 0225  CREATININE 2.51* 2.58* 2.61* 2.31*   Estimated Creatinine Clearance: 38 mL/min (by C-G formula based on Cr of 2.31).  Radiologic Imaging: US Renal  01/16/2015   CLINICAL DATA:  Renal failure.  EXAM: RENAL / URINARY TRACT ULTRASOUND COMPLETE  COMPARISON:  None.  FINDINGS: Right Kidney:  Length: 15.8 cm. There is severe hydronephrosis of the renal calices and pelvis. Proximal ureter is dilated to 2.4 cm.  Left Kidney:  Length: 16.6 cm. Severe hydronephrosis with proximal ureteral dilated to 2.4 cm.  Bladder:  Bladder is mildly distended to 760 cubic cm. Patient unable to void. Echogenic material within the bladder suggest debris.  IMPRESSION: Severe bilateral hydronephrosis and distended bladder suggests bladder outlet obstruction. Recommend Foley catheterization.  These results will be called to the ordering clinician or representative by the Radiologist Assistant, and communication documented in the PACS or zVision Dashboard.   Electronically Signed   By: Suzy Bouchard M.D.   On: 01/16/2015 08:27    I independently reviewed the above imaging studies.  Impression/Recommendation 59 yo with multiple medical problems including schizophrenia, now admitted with Rhabdomyolysis and encephalopathy likely associated with UTI and underlying psychosis (also off psych meds at this time).  Found to have bilateral ureteral obstruction most likely secondary to bladder outlet obstruction with associated AKI.  Foley is now in place and draining well.  Benign but enlarged gland on exam.   - Continue Foley Catheter  - Start Flomax 0.4mg  daily  - Trend creatinine  - Will consider trial of void as inpatient vs. Outpatient depending on clinical progress  - Treat UTI per primary team  - Will need  repeat U/S in the future (if his creatinine normalizes with foley in place this may be done as outpatient)  We will continue to follow  I performed a history and physical examination of the patient and  discussed his management with the resident.  I reviewed the resident's note and agree with the documented findings and plan of care    Spoke to pt re f/up

## 2015-01-16 NOTE — Progress Notes (Signed)
UR COMPLETED  

## 2015-01-17 LAB — COMPREHENSIVE METABOLIC PANEL
ALBUMIN: 2.2 g/dL — AB (ref 3.5–5.0)
ALT: 134 U/L — ABNORMAL HIGH (ref 17–63)
AST: 178 U/L — ABNORMAL HIGH (ref 15–41)
Alkaline Phosphatase: 80 U/L (ref 38–126)
Anion gap: 7 (ref 5–15)
BILIRUBIN TOTAL: 0.4 mg/dL (ref 0.3–1.2)
BUN: 16 mg/dL (ref 6–20)
CO2: 24 mmol/L (ref 22–32)
CREATININE: 2.05 mg/dL — AB (ref 0.61–1.24)
Calcium: 7.8 mg/dL — ABNORMAL LOW (ref 8.9–10.3)
Chloride: 111 mmol/L (ref 101–111)
GFR, EST AFRICAN AMERICAN: 40 mL/min — AB (ref >60)
GFR, EST NON AFRICAN AMERICAN: 35 mL/min — AB (ref >60)
Glucose, Bld: 122 mg/dL — ABNORMAL HIGH (ref 65–99)
Potassium: 2.8 mmol/L — ABNORMAL LOW (ref 3.5–5.1)
Sodium: 142 mmol/L (ref 135–145)
Total Protein: 5.5 g/dL — ABNORMAL LOW (ref 6.5–8.1)

## 2015-01-17 LAB — URINE CULTURE

## 2015-01-17 LAB — CK: Total CK: 6612 U/L — ABNORMAL HIGH (ref 49–397)

## 2015-01-17 LAB — CLOSTRIDIUM DIFFICILE BY PCR: CDIFFPCR: NEGATIVE

## 2015-01-17 MED ORDER — SODIUM CHLORIDE 0.9 % IV SOLN
3.0000 g | Freq: Three times a day (TID) | INTRAVENOUS | Status: DC
  Administered 2015-01-17 – 2015-01-20 (×10): 3 g via INTRAVENOUS
  Filled 2015-01-17 (×15): qty 3

## 2015-01-17 MED ORDER — POTASSIUM CHLORIDE CRYS ER 20 MEQ PO TBCR
40.0000 meq | EXTENDED_RELEASE_TABLET | ORAL | Status: AC
  Administered 2015-01-17 (×2): 40 meq via ORAL
  Filled 2015-01-17 (×2): qty 2

## 2015-01-17 MED ORDER — POTASSIUM CHLORIDE CRYS ER 20 MEQ PO TBCR
40.0000 meq | EXTENDED_RELEASE_TABLET | Freq: Once | ORAL | Status: AC
  Administered 2015-01-17: 40 meq via ORAL
  Filled 2015-01-17: qty 2

## 2015-01-17 MED ORDER — POTASSIUM CHLORIDE 10 MEQ/100ML IV SOLN
10.0000 meq | INTRAVENOUS | Status: DC
  Filled 2015-01-17: qty 100

## 2015-01-17 MED ORDER — NICOTINE 14 MG/24HR TD PT24
14.0000 mg | MEDICATED_PATCH | Freq: Every day | TRANSDERMAL | Status: DC
  Administered 2015-01-17 – 2015-01-20 (×4): 14 mg via TRANSDERMAL
  Filled 2015-01-17 (×4): qty 1

## 2015-01-17 NOTE — Progress Notes (Addendum)
ANTIBIOTIC CONSULT NOTE - INITIAL  Pharmacy Consult:  Unasyn Indication:  Acinetobacter bacteremia/UTI + Enterococcal UTI  No Known Allergies  Patient Measurements: Height: 5\' 11"  (180.3 cm) Weight: 194 lb 3.6 oz (88.1 kg) IBW/kg (Calculated) : 75.3  Vital Signs: Temp: 98 F (36.7 C) (06/20 0744) Temp Source: Oral (06/20 0744) BP: 129/73 mmHg (06/20 0738) Pulse Rate: 78 (06/20 0738) Intake/Output from previous day: 06/19 0701 - 06/20 0700 In: 4670 [P.O.:120; I.V.:4500; IV Piggyback:50] Out: 6600 [Urine:6600] Intake/Output from this shift: Total I/O In: 760 [P.O.:360; I.V.:400] Out: 725 [Urine:725]  Labs:  Recent Labs  01/15/15 0241 01/16/15 0225 01/17/15 0241  WBC 19.7*  --   --   HGB 12.8*  --   --   PLT 251  --   --   CREATININE 2.61* 2.31* 2.05*   Estimated Creatinine Clearance: 42.9 mL/min (by C-G formula based on Cr of 2.05). No results for input(s): VANCOTROUGH, VANCOPEAK, VANCORANDOM, GENTTROUGH, GENTPEAK, GENTRANDOM, TOBRATROUGH, TOBRAPEAK, TOBRARND, AMIKACINPEAK, AMIKACINTROU, AMIKACIN in the last 72 hours.   Microbiology: Recent Results (from the past 720 hour(s))  Urine culture     Status: None   Collection Time: 01/13/15  3:30 PM  Result Value Ref Range Status   Specimen Description URINE, CLEAN CATCH  Final   Special Requests NONE  Final   Culture   Final    >=100,000 COLONIES/mL ACINETOBACTER CALCOACETICUS/BAUMANNII COMPLEX >=100,000 COLONIES/mL ENTEROCOCCUS FAECALIS Performed at [facility]    Report Status 01/17/2015 FINAL  Final   Organism ID, Bacteria ACINETOBACTER CALCOACETICUS/BAUMANNII COMPLEX  Final   Organism ID, Bacteria ENTEROCOCCUS FAECALIS  Final      Susceptibility   Acinetobacter calcoaceticus/baumannii complex - MIC*    CEFTAZIDIME 4 SENSITIVE Sensitive     CEFTRIAXONE 16 INTERMEDIATE Intermediate     CIPROFLOXACIN <=0.25 SENSITIVE Sensitive     GENTAMICIN <=1 SENSITIVE Sensitive     IMIPENEM <=0.25 SENSITIVE  Sensitive     PIP/TAZO <=4 SENSITIVE Sensitive     TRIMETH/SULFA <=20 SENSITIVE Sensitive     AMPICILLIN/SULBACTAM <=2 SENSITIVE Sensitive     * >=100,000 COLONIES/mL ACINETOBACTER CALCOACETICUS/BAUMANNII COMPLEX   Enterococcus faecalis - MIC*    AMPICILLIN <=2 SENSITIVE Sensitive     LEVOFLOXACIN 1 SENSITIVE Sensitive     NITROFURANTOIN 32 SENSITIVE Sensitive     VANCOMYCIN 1 SENSITIVE Sensitive     LINEZOLID 2 SENSITIVE Sensitive     * >=100,000 COLONIES/mL ENTEROCOCCUS FAECALIS  Culture, blood (routine x 2)     Status: None (Preliminary result)   Collection Time: 01/13/15  4:00 PM  Result Value Ref Range Status   Specimen Description BLOOD RIGHT ANTECUBITAL  Final   Special Requests BOTTLES DRAWN AEROBIC AND ANAEROBIC  5CC EACH  Final   Culture  Setup Time   Final    GRAM NEGATIVE RODS AEROBIC BOTTLE ONLY CRITICAL RESULT CALLED TO, READ BACK BY AND VERIFIED WITH: J. LINDSAY @ 0836 ON [id number] BY Rhea Bleacher    Culture   Final    ACINETOBACTER CALCOACETICUS/BAUMANNII COMPLEX Performed at [facility]    Report Status PENDING  Incomplete   Organism ID, Bacteria ACINETOBACTER CALCOACETICUS/BAUMANNII COMPLEX  Final      Susceptibility   Acinetobacter calcoaceticus/baumannii complex - MIC*    CEFTAZIDIME 4 SENSITIVE Sensitive     CEFTRIAXONE 16 INTERMEDIATE Intermediate     CIPROFLOXACIN <=0.25 SENSITIVE Sensitive     GENTAMICIN <=1 SENSITIVE Sensitive     IMIPENEM <=0.25 SENSITIVE Sensitive     PIP/TAZO <=4 SENSITIVE  Sensitive     TRIMETH/SULFA <=20 SENSITIVE Sensitive     AMPICILLIN/SULBACTAM <=2 SENSITIVE Sensitive     * ACINETOBACTER CALCOACETICUS/BAUMANNII COMPLEX  Culture, blood (routine x 2)     Status: None (Preliminary result)   Collection Time: 01/13/15  4:05 PM  Result Value Ref Range Status   Specimen Description BLOOD LEFT FOREARM  Final   Special Requests BOTTLES DRAWN AEROBIC AND ANAEROBIC 5CC EACH  Final   Culture   Final    NO GROWTH 3 DAYS Performed  at [facility]    Report Status PENDING  Incomplete  MRSA PCR Screening     Status: None   Collection Time: 01/13/15 10:02 PM  Result Value Ref Range Status   MRSA by PCR NEGATIVE NEGATIVE Final    Comment:        The GeneXpert MRSA Assay (FDA approved for NASAL specimens only), is one component of a comprehensive MRSA colonization surveillance program. It is not intended to diagnose MRSA infection nor to guide or monitor treatment for MRSA infections.     Medical History: Past Medical History  Diagnosis Date  . Paranoid schizophrenia       Assessment: 89 YOM to transition to Unasyn for Acinetobacter bacteremia and Acinetobacter/Enterococcal UTI.  Patient's rhabdomyolysis and renal function are improving.  CTX  6/16 >> 6/17 Zosyn 6/17 >> 6/19 Fortaz 6/19 >> 6/20 Unasyn 6/20 >>  6/16 UCx - Acinetobacter + Enterococcus (pan sensitive) 6/16 BCx x2 - Acinetobacter (sensitive to Unasyn, Fortaz, Cipro, Primaxin, Zosyn, Septra)   Goal of Therapy:  Resolution of infection   Plan:  - Unasyn 3gm IV Q8H - Could change to Augmentin 875mg  BID at discharge if want PO regimen - Pharmacy will sign off as dosage adjustment is unnecessary even with improved renal function.  Thank you for the consult!    Terrea Bruster D. Mina Marble, PharmD, BCPS Pager:  [phone] 01/17/2015, 11:27 AM

## 2015-01-17 NOTE — Progress Notes (Signed)
Medicare Important Message given? YES (If response is "NO", the following Medicare IM given date fields will be blank) Date Medicare IM given:01/17/15 Medicare IM given by: Whitman Hero

## 2015-01-17 NOTE — Progress Notes (Signed)
[facility] PROGRESS NOTE  Edwin [name]:[id number] DOB: 1956-06-29 DOA: 01/13/2015 PCP: No primary care provider on file.  Assessment/Plan: 1. Sepsis due to UTI- patient initially started on vancomycin and Zosyn, was later changed to Rocephin. Blood cultures growing gram-negative rods, patient restarted on Zosyn. Final blood culture shows ACINETOBACTER CALCOACETICUS/BAUMANNII COMPLEX sensitive to Zosyn, antibiotic changed to Ceftazidime  for narrowing the antibiotic coverage. Improving. Day # 5 of the antibiotic therapy. 2. Encephalopathy- patient is confused has a history of paranoid schizophrenia and now has UTI/sepsis which is contributing to the consfusion. Confusion has mildly improved, still trying to remove IV and lines.sitter at bedside. 3. Rhabdomyolysis- patient started on IV fluids normal saline at 150 mL per hour. CK is down to 6612 , nephrology consulted. 4. Acute kidney injury- secondary to postobstructive uropathy, patient's creatinine is 2.05 after foley catheter was inserted.  Renal ultrasound was done which showed severe bilateral hydronephrosis and distended bladder suggesting bladder outlet obstruction. Urology consulted, and recommend to send home on foley and follow up as outpatient in 2 weeks 5. Transaminitis- slowly improving patient presented with elevated AST and ALT level likely from the rhabdomyolysis. AST 410---266- -204--178, ALT 118--122.--128--134 6. Hypokalemia- potassiums to 2.8 despite replacement. Will replace potassium and check BMP in a.m. 7. Hyperlipidemia- patient was taking statin at home which has been discontinued this time due to the rhabdomyolysis 8. Paranoid schizophrenia- psychotropic  medications on hold as patient is unable to take  by mouth. 9. DVT prophylaxis- heparin  Code Status: Full code Family Communication: *No family at bedside Disposition Plan: ?  SNF   Consultants:  None  Procedures:  None  Antibiotics:  None  HPI/Subjective: 59 y.o. male h/o paranoid schizophrenia, found down at his home by brother who went to check up on him after he wasn't answering phone for a couple of days. Patient was found on floor at home, awake and responsive. Normally takes care of self without trouble. Patient admits to 2-3 day history of pain and burning on urination  Patient still confused, though better than yesterday.  Objective: Filed Vitals:   01/17/15 0744  BP:   Pulse:   Temp: 98 F (36.7 C)  Resp:     Intake/Output Summary (Last 24 hours) at 01/17/15 1109 Last data filed at 01/17/15 0900  Gross per 24 hour  Intake   4730 ml  Output   5425 ml  Net   -695 ml   Filed Weights   01/13/15 1854 01/16/15 0443 01/17/15 0500  Weight: 88 kg (194 lb 0.1 oz) 88.1 kg (194 lb 3.6 oz) 88.1 kg (194 lb 3.6 oz)    Exam:   General:  Appear in no acute distress  Cardiovascular: S1S2 RRR  Respiratory: Clear bilaterally  Abdomen: Soft, nontender  Musculoskeletal: No edema of the lower extremities  Data Reviewed: Basic Metabolic Panel:  Recent Labs Lab 01/13/15 1500 01/14/15 0237 01/15/15 0241 01/16/15 0225 01/17/15 0241  NA 136 139 146* 147* 142  K 2.5* 2.8* 2.9* 3.1* 2.8*  CL 99* 108 115* 117* 111  CO2 25 21* 19* 22 24  GLUCOSE 140* 126* 122* 98 122*  BUN 15 15 19 19 16   CREATININE 2.51* 2.58* 2.61* 2.31* 2.05*  CALCIUM 8.6* 7.8* 8.0* 8.1* 7.8*   Liver Function Tests:  Recent Labs Lab 01/13/15 1500 01/15/15 0241 01/16/15 0225 01/17/15 0241  AST 410* 266* 204* 178*  ALT 118* 122* 128* 134*  ALKPHOS 75 78 79 80  BILITOT 1.1 1.0 0.6  0.4  PROT 6.7 5.8* 5.6* 5.5*  ALBUMIN 3.8 2.7* 2.4* 2.2*   No results for input(s): LIPASE, AMYLASE in the last 168 hours. No results for input(s): AMMONIA in the last 168 hours. CBC:  Recent Labs Lab 01/13/15 1500 01/14/15 0237 01/15/15 0241  WBC 24.0* 26.2* 19.7*   NEUTROABS 21.9*  --   --   HGB 14.2 12.4* 12.8*  HCT 40.5 35.9* 37.4*  MCV 79.9 80.7 81.1  PLT 270 224 251   Cardiac Enzymes:  Recent Labs Lab 01/13/15 1500 01/14/15 0237 01/15/15 0241 01/16/15 0225 01/17/15 0241  CKTOTAL 38250* 38104* 21690* 11101* 6612*   BNP (last 3 results) No results for input(s): BNP in the last 8760 hours.  ProBNP (last 3 results) No results for input(s): PROBNP in the last 8760 hours.  CBG: No results for input(s): GLUCAP in the last 168 hours.  Recent Results (from the past 240 hour(s))  Urine culture     Status: None   Collection Time: 01/13/15  3:30 PM  Result Value Ref Range Status   Specimen Description URINE, CLEAN CATCH  Final   Special Requests NONE  Final   Culture   Final    >=100,000 COLONIES/mL ACINETOBACTER CALCOACETICUS/BAUMANNII COMPLEX >=100,000 COLONIES/mL ENTEROCOCCUS FAECALIS Performed at [facility]    Report Status 01/17/2015 FINAL  Final   Organism ID, Bacteria ACINETOBACTER CALCOACETICUS/BAUMANNII COMPLEX  Final   Organism ID, Bacteria ENTEROCOCCUS FAECALIS  Final      Susceptibility   Acinetobacter calcoaceticus/baumannii complex - MIC*    CEFTAZIDIME 4 SENSITIVE Sensitive     CEFTRIAXONE 16 INTERMEDIATE Intermediate     CIPROFLOXACIN <=0.25 SENSITIVE Sensitive     GENTAMICIN <=1 SENSITIVE Sensitive     IMIPENEM <=0.25 SENSITIVE Sensitive     PIP/TAZO <=4 SENSITIVE Sensitive     TRIMETH/SULFA <=20 SENSITIVE Sensitive     AMPICILLIN/SULBACTAM <=2 SENSITIVE Sensitive     * >=100,000 COLONIES/mL ACINETOBACTER CALCOACETICUS/BAUMANNII COMPLEX   Enterococcus faecalis - MIC*    AMPICILLIN <=2 SENSITIVE Sensitive     LEVOFLOXACIN 1 SENSITIVE Sensitive     NITROFURANTOIN 32 SENSITIVE Sensitive     VANCOMYCIN 1 SENSITIVE Sensitive     LINEZOLID 2 SENSITIVE Sensitive     * >=100,000 COLONIES/mL ENTEROCOCCUS FAECALIS  Culture, blood (routine x 2)     Status: None (Preliminary result)   Collection Time: 01/13/15   4:00 PM  Result Value Ref Range Status   Specimen Description BLOOD RIGHT ANTECUBITAL  Final   Special Requests BOTTLES DRAWN AEROBIC AND ANAEROBIC  5CC EACH  Final   Culture  Setup Time   Final    GRAM NEGATIVE RODS AEROBIC BOTTLE ONLY CRITICAL RESULT CALLED TO, READ BACK BY AND VERIFIED WITH: J. LINDSAY @ 0836 ON [id number] BY Rhea Bleacher    Culture   Final    ACINETOBACTER CALCOACETICUS/BAUMANNII COMPLEX Performed at [facility]    Report Status PENDING  Incomplete   Organism ID, Bacteria ACINETOBACTER CALCOACETICUS/BAUMANNII COMPLEX  Final      Susceptibility   Acinetobacter calcoaceticus/baumannii complex - MIC*    CEFTAZIDIME 4 SENSITIVE Sensitive     CEFTRIAXONE 16 INTERMEDIATE Intermediate     CIPROFLOXACIN <=0.25 SENSITIVE Sensitive     GENTAMICIN <=1 SENSITIVE Sensitive     IMIPENEM <=0.25 SENSITIVE Sensitive     PIP/TAZO <=4 SENSITIVE Sensitive     TRIMETH/SULFA <=20 SENSITIVE Sensitive     AMPICILLIN/SULBACTAM <=2 SENSITIVE Sensitive     * ACINETOBACTER CALCOACETICUS/BAUMANNII COMPLEX  Culture, blood (  routine x 2)     Status: None (Preliminary result)   Collection Time: 01/13/15  4:05 PM  Result Value Ref Range Status   Specimen Description BLOOD LEFT FOREARM  Final   Special Requests BOTTLES DRAWN AEROBIC AND ANAEROBIC 5CC EACH  Final   Culture   Final    NO GROWTH 3 DAYS Performed at [facility]    Report Status PENDING  Incomplete  MRSA PCR Screening     Status: None   Collection Time: 01/13/15 10:02 PM  Result Value Ref Range Status   MRSA by PCR NEGATIVE NEGATIVE Final    Comment:        The GeneXpert MRSA Assay (FDA approved for NASAL specimens only), is one component of a comprehensive MRSA colonization surveillance program. It is not intended to diagnose MRSA infection nor to guide or monitor treatment for MRSA infections.      Studies: US Renal  01/16/2015   CLINICAL DATA:  Renal failure.  EXAM: RENAL / URINARY TRACT ULTRASOUND  COMPLETE  COMPARISON:  None.  FINDINGS: Right Kidney:  Length: 15.8 cm. There is severe hydronephrosis of the renal calices and pelvis. Proximal ureter is dilated to 2.4 cm.  Left Kidney:  Length: 16.6 cm. Severe hydronephrosis with proximal ureteral dilated to 2.4 cm.  Bladder:  Bladder is mildly distended to 760 cubic cm. Patient unable to void. Echogenic material within the bladder suggest debris.  IMPRESSION: Severe bilateral hydronephrosis and distended bladder suggests bladder outlet obstruction. Recommend Foley catheterization.  These results will be called to the ordering clinician or representative by the Radiologist Assistant, and communication documented in the PACS or zVision Dashboard.   Electronically Signed   By: Suzy Bouchard M.D.   On: 01/16/2015 08:27    Scheduled Meds: . benztropine  0.5 mg Oral q morning - 10a  . cefTAZidime (FORTAZ)  IV  1 g Intravenous Q12H  . clozapine  800 mg Oral QHS  . heparin  5,000 Units Subcutaneous 3 times per day  . potassium chloride  10 mEq Intravenous Q1 Hr x 3  . sodium chloride  1,000 mL Intravenous Once  . sodium chloride  3 mL Intravenous Q12H  . tamsulosin  0.4 mg Oral QPC supper   Continuous Infusions: . sodium chloride 50 mL/hr at 01/17/15 0045  . dextrose 150 mL/hr at 01/17/15 1107    Principal Problem:   Sepsis secondary to UTI Active Problems:   Paranoid schizophrenia, chronic condition   AKI (acute kidney injury)   Rhabdomyolysis   HLD (hyperlipidemia)    Time spent: 25 min    [facility] Pager [phone]. If 7PM-7AM, please contact night-coverage at [email or web address], password [facility] 01/17/2015, 11:09 AM  LOS: 4 days

## 2015-01-17 NOTE — Progress Notes (Signed)
Cr improving Send home with flomax and foley See [place] in clinic

## 2015-01-18 LAB — CULTURE, BLOOD (ROUTINE X 2): CULTURE: NO GROWTH

## 2015-01-18 LAB — COMPREHENSIVE METABOLIC PANEL
ALBUMIN: 2.4 g/dL — AB (ref 3.5–5.0)
ALT: 115 U/L — ABNORMAL HIGH (ref 17–63)
ANION GAP: 6 (ref 5–15)
AST: 101 U/L — ABNORMAL HIGH (ref 15–41)
Alkaline Phosphatase: 69 U/L (ref 38–126)
BILIRUBIN TOTAL: 0.4 mg/dL (ref 0.3–1.2)
BUN: 11 mg/dL (ref 6–20)
CALCIUM: 8.2 mg/dL — AB (ref 8.9–10.3)
CO2: 25 mmol/L (ref 22–32)
Chloride: 113 mmol/L — ABNORMAL HIGH (ref 101–111)
Creatinine, Ser: 1.86 mg/dL — ABNORMAL HIGH (ref 0.61–1.24)
GFR calc non Af Amer: 39 mL/min — ABNORMAL LOW (ref >60)
GFR, EST AFRICAN AMERICAN: 45 mL/min — AB (ref >60)
Glucose, Bld: 121 mg/dL — ABNORMAL HIGH (ref 65–99)
Potassium: 3.2 mmol/L — ABNORMAL LOW (ref 3.5–5.1)
Sodium: 144 mmol/L (ref 135–145)
Total Protein: 5.6 g/dL — ABNORMAL LOW (ref 6.5–8.1)

## 2015-01-18 LAB — CK: CK TOTAL: 2484 U/L — AB (ref 49–397)

## 2015-01-18 NOTE — Evaluation (Signed)
Physical Therapy Evaluation Patient Details Name: Edwin [name] MRN: [id number] DOB: Oct 17, 1955 Today's Date: 01/18/2015   History of Present Illness  Edwin [name] is a 59 y.o. male h/o paranoid schizophrenia, found down at his home by brother who went to check up on him after he wasn't answering phone for a couple of days. Patient was found on floor at home, awake and responsive. Normally takes care of self without trouble. Patient admits to 2-3 day history of pain and burning on urination.  Clinical Impression  Pt admitted with/for UTI.  Pt currently limited functionally due to the problems listed below.  (see problems list.)  Pt will benefit from PT to maximize function and safety to be able to get home safely with limited available assist.     Follow Up Recommendations      Equipment Recommendations  None recommended by PT    Recommendations for Other Services       Precautions / Restrictions Precautions Precautions: Fall      Mobility  Bed Mobility Overal bed mobility: Modified Independent                Transfers Overall transfer level: Modified independent                  Ambulation/Gait Ambulation/Gait assistance: Supervision Ambulation Distance (Feet): 330 Feet Assistive device: None Gait Pattern/deviations: Step-through pattern Gait velocity: slower Gait velocity interpretation: Below normal speed for age/gender General Gait Details: mildly unsteady, but able to self recover instability with  steppage or drift.  Stairs            Wheelchair Mobility    Modified Rankin (Stroke Patients Only)       Balance Overall balance assessment: Needs assistance   Sitting balance-Leahy Scale: Good     Standing balance support: No upper extremity supported                                 Pertinent Vitals/Pain Pain Assessment: No/denies pain    Home Living Family/patient expects to be discharged to:: Private  residence Living Arrangements: Alone Available Help at Discharge: Family;Available PRN/intermittently Type of Home: House Home Access: Stairs to enter     Home Layout: One level Home Equipment: None      Prior Function Level of Independence: Independent         Comments: brother does finances: pt      Hand Dominance        Extremity/Trunk Assessment   Upper Extremity Assessment: Defer to OT evaluation           Lower Extremity Assessment: Overall WFL for tasks assessed         Communication   Communication: No difficulties  Cognition Arousal/Alertness: Awake/alert Behavior During Therapy: WFL for tasks assessed/performed Overall Cognitive Status: Within Functional Limits for tasks assessed                      General Comments      Exercises        Assessment/Plan    PT Assessment Patient needs continued PT services  PT Diagnosis Generalized weakness   PT Problem List Decreased activity tolerance;Decreased balance  PT Treatment Interventions Gait training;Stair training;Functional mobility training;Therapeutic activities;Patient/family education   PT Goals (Current goals can be found in the Care Plan section) Acute Rehab PT Goals Patient Stated Goal: HOME PT Goal Formulation: With patient Time For  Goal Achievement: 01/25/15 Potential to Achieve Goals: Good    Frequency Min 2X/week   Barriers to discharge        Co-evaluation               End of Session   Activity Tolerance: Patient tolerated treatment well Patient left: in chair;with call bell/phone within reach;with nursing/sitter in room Nurse Communication: Mobility status         Time: 1000-1020 PT Time Calculation (min) (ACUTE ONLY): 20 min   Charges:   PT Evaluation $Initial PT Evaluation Tier I: 1 Procedure     PT G Codes:        Edwin [name], [name] 01/18/2015, 10:58 AM 01/18/2015  Donnella Sham, PT [phone]  (pager)

## 2015-01-18 NOTE — Evaluation (Signed)
Occupational Therapy Evaluation Patient Details Name: Edwin [name] MRN: [id number] DOB: Jul 26, 1956 Today's Date: 01/18/2015    History of Present Illness Edwin [name] is a 59 y.o. male h/o paranoid schizophrenia, found down at his home by brother who went to check up on him after he wasn't answering phone for a couple of days. Patient was found on floor at home, awake and responsive. Normally takes care of self without trouble. Patient admits to 2-3 day history of pain and burning on urination.   Clinical Impression   Pt admitted with above. He demonstrates the below listed deficits and will benefit from continued OT to maximize safety and independence with BADLs.  Pt presents to OT with generalized weakness and intermittent confusion.  He currently requires min guard assist for ADLs.  Anticipate he will quickly return to independent level and will be safe to return home with intermittent assist/supervision.       Follow Up Recommendations  No OT follow up;Supervision - Intermittent    Equipment Recommendations  None recommended by OT    Recommendations for Other Services       Precautions / Restrictions Precautions Precautions: Fall      Mobility Bed Mobility                  Transfers Overall transfer level: Needs assistance   Transfers: Sit to/from Stand;Stand Pivot Transfers Sit to Stand: Supervision Stand pivot transfers: Min guard       General transfer comment: assist for balance and lines     Balance Overall balance assessment: Needs assistance   Sitting balance-Leahy Scale: Good     Standing balance support: During functional activity Standing balance-Leahy Scale: Fair Standing balance comment: Pt mildly unsteady                            ADL Overall ADL's : Needs assistance/impaired Eating/Feeding: Independent   Grooming: Wash/dry hands;Wash/dry face;Oral care;Brushing hair;Min guard;Standing   Upper Body Bathing: Set  up;Sitting   Lower Body Bathing: Min guard;Sit to/from stand   Upper Body Dressing : Set up;Sitting   Lower Body Dressing: Min guard;Sit to/from stand   Toilet Transfer: Min guard;Ambulation;Regular Toilet;Comfort height toilet   Toileting- Clothing Manipulation and Hygiene: Min guard;Sit to/from stand   Tub/ Shower Transfer: Min guard   Functional mobility during ADLs: Min guard General ADL Comments: Pt with mild, intermittent confusion.  Min guard assist for balance.      Vision     Perception     Praxis      Pertinent Vitals/Pain Pain Assessment: No/denies pain     Hand Dominance Right   Extremity/Trunk Assessment Upper Extremity Assessment Upper Extremity Assessment: Generalized weakness   Lower Extremity Assessment Lower Extremity Assessment: Defer to PT evaluation   Cervical / Trunk Assessment Cervical / Trunk Assessment: Normal   Communication Communication Communication: No difficulties   Cognition Arousal/Alertness: Awake/alert Behavior During Therapy: WFL for tasks assessed/performed Overall Cognitive Status: No family/caregiver present to determine baseline cognitive functioning Area of Impairment: Awareness                   General Comments       Exercises       Shoulder Instructions      Home Living Family/patient expects to be discharged to:: Private residence Living Arrangements: Alone Available Help at Discharge: Family;Available PRN/intermittently Type of Home: House Home Access: Stairs to enter  Home Layout: One level     Bathroom Shower/Tub: Teacher, early years/pre: Standard     Home Equipment: None          Prior Functioning/Environment Level of Independence: Independent        Comments: Pt reports he drives and grocery shops.  brother manages finances     OT Diagnosis: Generalized weakness;Cognitive deficits   OT Problem List: Decreased strength;Decreased activity tolerance;Impaired  balance (sitting and/or standing);Decreased cognition   OT Treatment/Interventions: Self-care/ADL training;DME and/or AE instruction;Therapeutic activities;Cognitive remediation/compensation;Balance training;Patient/family education    OT Goals(Current goals can be found in the care plan section) Acute Rehab OT Goals Patient Stated Goal: HOME OT Goal Formulation: With patient Time For Goal Achievement: 02/01/15 Potential to Achieve Goals: Good ADL Goals Pt Will Perform Grooming: Independently;standing Pt Will Perform Upper Body Bathing: Independently;sitting;standing Pt Will Perform Lower Body Bathing: Independently;sit to/from stand Pt Will Perform Upper Body Dressing: Independently;sitting Pt Will Perform Lower Body Dressing: Independently;sit to/from stand Pt Will Transfer to Toilet: Independently;ambulating;regular height toilet Pt Will Perform Toileting - Clothing Manipulation and hygiene: Independently;sit to/from stand Pt Will Perform Tub/Shower Transfer: Tub transfer;Independently  OT Frequency: Min 2X/week   Barriers to D/C:            Co-evaluation              End of Session Nurse Communication: Mobility status  Activity Tolerance: Patient tolerated treatment well Patient left: in chair;with call bell/phone within reach;with nursing/sitter in room   Time: 1416-1436 OT Time Calculation (min): 20 min Charges:  OT General Charges $OT Visit: 1 Procedure OT Evaluation $Initial OT Evaluation Tier I: 1 Procedure G-Codes:    Lucille Passy M 2015-01-21, 3:59 PM

## 2015-01-18 NOTE — Progress Notes (Signed)
[facility] PROGRESS NOTE  Edwin [name] QZR:[id number] DOB: 18-Oct-1955 DOA: 01/13/2015 PCP: No primary care provider on file.  Assessment/Plan: 1. Sepsis due to UTI- patient initially started on vancomycin and Zosyn, was later changed to Rocephin. Blood cultures growing gram-negative rods, patient restarted on Zosyn. Final blood culture shows ACINETOBACTER CALCOACETICUS/BAUMANNII COMPLEX sensitive to Zosyn, antibiotic changed to Ceftazidime  for narrowing the antibiotic coverage. Improving. Day # 6 of the antibiotic therapy. 2. Encephalopathy-  Improving, patient  confused has a history of paranoid schizophrenia and now has UTI/sepsis which is contributing to the consfusion. Confusion has mildly improved, still trying to remove IV and lines.sitter at bedside. 3. Rhabdomyolysis- patient started on IV fluids D5w  at 150 mL per hour and 0.9% NS @ 50 ml/hr, CK is down to 2484 , nephrology was consulted. Consider changing the IV fluids in am if CK less than 1000. 4. Acute kidney injury- secondary to postobstructive uropathy, patient's creatinine is 1.86  foley catheter was inserted.  Renal ultrasound was done which showed severe bilateral hydronephrosis and distended bladder suggesting bladder outlet obstruction. Urology consulted, and recommend to send home on foley and follow up as outpatient in 2 weeks 5. Transaminitis- slowly improving patient presented with elevated AST and ALT level likely from the rhabdomyolysis. AST 410---266- -204--178--101, ALT 118--122.--128--134--115 6. Hypokalemia- potassiums to 3.2 despite replacement. Will replace potassium and check BMP in a.m. 7. Hyperlipidemia- patient was taking statin at home which has been discontinued this time due to the rhabdomyolysis 8. Paranoid schizophrenia- psychotropic  medications on hold as patient is unable to take  by mouth. 9. DVT prophylaxis- heparin  Code Status: Full code Family Communication: *No family at  bedside Disposition Plan: ? SNF   Consultants:  None  Procedures:  None  Antibiotics:  None  HPI/Subjective: 59 y.o. male h/o paranoid schizophrenia, found down at his home by brother who went to check up on him after he wasn't answering phone for a couple of days. Patient was found on floor at home, awake and responsive. Normally takes care of self without trouble. Patient admits to 2-3 day history of pain and burning on urination  Patient still gets intermittently confused, CK slowly improving.  Objective: Filed Vitals:   01/18/15 0736  BP: 129/77  Pulse: 85  Temp: 98.5 F (36.9 C)  Resp: 25    Intake/Output Summary (Last 24 hours) at 01/18/15 1126 Last data filed at 01/18/15 1115  Gross per 24 hour  Intake   2970 ml  Output   8275 ml  Net  -5305 ml   Filed Weights   01/16/15 0443 01/17/15 0500 01/18/15 0500  Weight: 88.1 kg (194 lb 3.6 oz) 88.1 kg (194 lb 3.6 oz) 86 kg (189 lb 9.5 oz)    Exam:   General:  Appear in no acute distress  Cardiovascular: S1S2 RRR  Respiratory: Clear bilaterally  Abdomen: Soft, nontender  Musculoskeletal: No edema of the lower extremities  Data Reviewed: Basic Metabolic Panel:  Recent Labs Lab 01/14/15 0237 01/15/15 0241 01/16/15 0225 01/17/15 0241 01/18/15 0252  NA 139 146* 147* 142 144  K 2.8* 2.9* 3.1* 2.8* 3.2*  CL 108 115* 117* 111 113*  CO2 21* 19* 22 24 25   GLUCOSE 126* 122* 98 122* 121*  BUN 15 19 19 16 11   CREATININE 2.58* 2.61* 2.31* 2.05* 1.86*  CALCIUM 7.8* 8.0* 8.1* 7.8* 8.2*   Liver Function Tests:  Recent Labs Lab 01/13/15 1500 01/15/15 0241 01/16/15 0225 01/17/15 0241 01/18/15 0252  AST 410* 266* 204* 178* 101*  ALT 118* 122* 128* 134* 115*  ALKPHOS 75 78 79 80 69  BILITOT 1.1 1.0 0.6 0.4 0.4  PROT 6.7 5.8* 5.6* 5.5* 5.6*  ALBUMIN 3.8 2.7* 2.4* 2.2* 2.4*   No results for input(s): LIPASE, AMYLASE in the last 168 hours. No results for input(s): AMMONIA in the last 168  hours. CBC:  Recent Labs Lab 01/13/15 1500 01/14/15 0237 01/15/15 0241  WBC 24.0* 26.2* 19.7*  NEUTROABS 21.9*  --   --   HGB 14.2 12.4* 12.8*  HCT 40.5 35.9* 37.4*  MCV 79.9 80.7 81.1  PLT 270 224 251   Cardiac Enzymes:  Recent Labs Lab 01/14/15 0237 01/15/15 0241 01/16/15 0225 01/17/15 0241 01/18/15 0252  CKTOTAL 52778* 21690* 11101* 6612* 2484*   BNP (last 3 results) No results for input(s): BNP in the last 8760 hours.  ProBNP (last 3 results) No results for input(s): PROBNP in the last 8760 hours.  CBG: No results for input(s): GLUCAP in the last 168 hours.  Recent Results (from the past 240 hour(s))  Urine culture     Status: None   Collection Time: 01/13/15  3:30 PM  Result Value Ref Range Status   Specimen Description URINE, CLEAN CATCH  Final   Special Requests NONE  Final   Culture   Final    >=100,000 COLONIES/mL ACINETOBACTER CALCOACETICUS/BAUMANNII COMPLEX >=100,000 COLONIES/mL ENTEROCOCCUS FAECALIS Performed at [facility]    Report Status 01/17/2015 FINAL  Final   Organism ID, Bacteria ACINETOBACTER CALCOACETICUS/BAUMANNII COMPLEX  Final   Organism ID, Bacteria ENTEROCOCCUS FAECALIS  Final      Susceptibility   Acinetobacter calcoaceticus/baumannii complex - MIC*    CEFTAZIDIME 4 SENSITIVE Sensitive     CEFTRIAXONE 16 INTERMEDIATE Intermediate     CIPROFLOXACIN <=0.25 SENSITIVE Sensitive     GENTAMICIN <=1 SENSITIVE Sensitive     IMIPENEM <=0.25 SENSITIVE Sensitive     PIP/TAZO <=4 SENSITIVE Sensitive     TRIMETH/SULFA <=20 SENSITIVE Sensitive     AMPICILLIN/SULBACTAM <=2 SENSITIVE Sensitive     * >=100,000 COLONIES/mL ACINETOBACTER CALCOACETICUS/BAUMANNII COMPLEX   Enterococcus faecalis - MIC*    AMPICILLIN <=2 SENSITIVE Sensitive     LEVOFLOXACIN 1 SENSITIVE Sensitive     NITROFURANTOIN 32 SENSITIVE Sensitive     VANCOMYCIN 1 SENSITIVE Sensitive     LINEZOLID 2 SENSITIVE Sensitive     * >=100,000 COLONIES/mL ENTEROCOCCUS  FAECALIS  Culture, blood (routine x 2)     Status: None (Preliminary result)   Collection Time: 01/13/15  4:00 PM  Result Value Ref Range Status   Specimen Description BLOOD RIGHT ANTECUBITAL  Final   Special Requests BOTTLES DRAWN AEROBIC AND ANAEROBIC  5CC EACH  Final   Culture  Setup Time   Final    GRAM NEGATIVE RODS AEROBIC BOTTLE ONLY CRITICAL RESULT CALLED TO, READ BACK BY AND VERIFIED WITH: J. LINDSAY @ 0836 ON [id number] BY Rhea Bleacher    Culture   Final    ACINETOBACTER CALCOACETICUS/BAUMANNII COMPLEX Performed at [facility]    Report Status PENDING  Incomplete   Organism ID, Bacteria ACINETOBACTER CALCOACETICUS/BAUMANNII COMPLEX  Final      Susceptibility   Acinetobacter calcoaceticus/baumannii complex - MIC*    CEFTAZIDIME 4 SENSITIVE Sensitive     CEFTRIAXONE 16 INTERMEDIATE Intermediate     CIPROFLOXACIN <=0.25 SENSITIVE Sensitive     GENTAMICIN <=1 SENSITIVE Sensitive     IMIPENEM <=0.25 SENSITIVE Sensitive     PIP/TAZO <=4 SENSITIVE  Sensitive     TRIMETH/SULFA <=20 SENSITIVE Sensitive     AMPICILLIN/SULBACTAM <=2 SENSITIVE Sensitive     * ACINETOBACTER CALCOACETICUS/BAUMANNII COMPLEX  Culture, blood (routine x 2)     Status: None (Preliminary result)   Collection Time: 01/13/15  4:05 PM  Result Value Ref Range Status   Specimen Description BLOOD LEFT FOREARM  Final   Special Requests BOTTLES DRAWN AEROBIC AND ANAEROBIC 5CC EACH  Final   Culture   Final    NO GROWTH 4 DAYS Performed at [facility]    Report Status PENDING  Incomplete  MRSA PCR Screening     Status: None   Collection Time: 01/13/15 10:02 PM  Result Value Ref Range Status   MRSA by PCR NEGATIVE NEGATIVE Final    Comment:        The GeneXpert MRSA Assay (FDA approved for NASAL specimens only), is one component of a comprehensive MRSA colonization surveillance program. It is not intended to diagnose MRSA infection nor to guide or monitor treatment for MRSA infections.    Clostridium Difficile by PCR (not at [facility])     Status: None   Collection Time: 01/17/15 11:40 AM  Result Value Ref Range Status   C difficile by pcr NEGATIVE NEGATIVE Final     Studies: No results found.  Scheduled Meds: . ampicillin-sulbactam (UNASYN) IV  3 g Intravenous Q8H  . benztropine  0.5 mg Oral q morning - 10a  . clozapine  800 mg Oral QHS  . heparin  5,000 Units Subcutaneous 3 times per day  . nicotine  14 mg Transdermal Daily  . sodium chloride  1,000 mL Intravenous Once  . sodium chloride  3 mL Intravenous Q12H  . tamsulosin  0.4 mg Oral QPC supper   Continuous Infusions: . sodium chloride 50 mL/hr at 01/17/15 2314  . dextrose 150 mL/hr at 01/18/15 1583    Principal Problem:   Sepsis secondary to UTI Active Problems:   Paranoid schizophrenia, chronic condition   AKI (acute kidney injury)   Rhabdomyolysis   HLD (hyperlipidemia)    Time spent: 25 min    [facility] Pager [phone]. If 7PM-7AM, please contact night-coverage at [email or web address], password [facility] 01/18/2015, 11:26 AM  LOS: 5 days

## 2015-01-19 DIAGNOSIS — N179 Acute kidney failure, unspecified: Secondary | ICD-10-CM

## 2015-01-19 DIAGNOSIS — A419 Sepsis, unspecified organism: Secondary | ICD-10-CM

## 2015-01-19 DIAGNOSIS — M6282 Rhabdomyolysis: Secondary | ICD-10-CM

## 2015-01-19 DIAGNOSIS — R7881 Bacteremia: Secondary | ICD-10-CM

## 2015-01-19 DIAGNOSIS — F200 Paranoid schizophrenia: Secondary | ICD-10-CM

## 2015-01-19 DIAGNOSIS — N390 Urinary tract infection, site not specified: Secondary | ICD-10-CM

## 2015-01-19 LAB — CULTURE, BLOOD (ROUTINE X 2)

## 2015-01-19 LAB — COMPREHENSIVE METABOLIC PANEL
ALT: 93 U/L — ABNORMAL HIGH (ref 17–63)
ANION GAP: 9 (ref 5–15)
AST: 59 U/L — AB (ref 15–41)
Albumin: 2.4 g/dL — ABNORMAL LOW (ref 3.5–5.0)
Alkaline Phosphatase: 70 U/L (ref 38–126)
BILIRUBIN TOTAL: 0.2 mg/dL — AB (ref 0.3–1.2)
BUN: 12 mg/dL (ref 6–20)
CO2: 25 mmol/L (ref 22–32)
Calcium: 8.4 mg/dL — ABNORMAL LOW (ref 8.9–10.3)
Chloride: 108 mmol/L (ref 101–111)
Creatinine, Ser: 1.7 mg/dL — ABNORMAL HIGH (ref 0.61–1.24)
GFR calc Af Amer: 50 mL/min — ABNORMAL LOW (ref >60)
GFR, EST NON AFRICAN AMERICAN: 43 mL/min — AB (ref >60)
Glucose, Bld: 125 mg/dL — ABNORMAL HIGH (ref 65–99)
POTASSIUM: 2.9 mmol/L — AB (ref 3.5–5.1)
SODIUM: 142 mmol/L (ref 135–145)
TOTAL PROTEIN: 5.2 g/dL — AB (ref 6.5–8.1)

## 2015-01-19 LAB — MAGNESIUM: MAGNESIUM: 1.5 mg/dL — AB (ref 1.7–2.4)

## 2015-01-19 LAB — CK: CK TOTAL: 1002 U/L — AB (ref 49–397)

## 2015-01-19 MED ORDER — POTASSIUM CHLORIDE CRYS ER 20 MEQ PO TBCR
40.0000 meq | EXTENDED_RELEASE_TABLET | ORAL | Status: AC
  Administered 2015-01-19 (×2): 40 meq via ORAL
  Filled 2015-01-19 (×2): qty 2

## 2015-01-19 MED ORDER — MAGNESIUM SULFATE 2 GM/50ML IV SOLN
2.0000 g | Freq: Once | INTRAVENOUS | Status: AC
  Administered 2015-01-19: 2 g via INTRAVENOUS
  Filled 2015-01-19: qty 50

## 2015-01-19 MED ORDER — POTASSIUM CHLORIDE IN NACL 20-0.9 MEQ/L-% IV SOLN
INTRAVENOUS | Status: DC
  Administered 2015-01-19: 09:00:00 via INTRAVENOUS
  Filled 2015-01-19: qty 1000

## 2015-01-19 NOTE — Progress Notes (Signed)
Patient to transfer to 6N13 report given to receiving nurse Baxter Flattery all questions answered at this time.  Pt. VSS with no s/s of distress noted.  Patient stable at transfer.

## 2015-01-19 NOTE — Progress Notes (Addendum)
[facility] PROGRESS NOTE  Edwin [name] BUL:[id number] DOB: 11/13/1955 DOA: 01/13/2015 PCP: No primary care provider on file.  Assessment/Plan: 1. Sepsis due to acinectobacter UTI and bacteremia- patient initially started on vancomycin and Zosyn, was later changed to Rocephin. Blood cultures growing gram-negative rods, patient restarted on Zosyn. Final blood culture shows ACINETOBACTER CALCOACETICUS/BAUMANNII COMPLEX sensitive to Zosyn, antibiotic changed to Ceftazidime  for narrowing the antibiotic coverage. Improving. Day # 6 of the antibiotic therapy. 2. Encephalopathy- patient is confused has a history of paranoid schizophrenia and UTI/sepsis contributing to the consfusion. Confusion has mildly improved, still trying to remove IV and lines.sitter at bedside. 3. Rhabdomyolysis- CK is trending down ,continue ivf for another 24hrs, nephrology consulted and signed off on 6/19 4. Acute kidney injury- secondary to postobstructive uropathy, patient's creatinine is 2.05 after foley catheter was inserted.  Renal ultrasound was done which showed severe bilateral hydronephrosis and distended bladder suggesting bladder outlet obstruction. Urology consulted, and recommend to send home on foley and follow up as outpatient in 2 weeks 5. Transaminitis- slowly improving patient presented with elevated AST and ALT level likely from the rhabdomyolysis. AST /alt trending down 6. Hypokalemia- replaceK , check mag. Addendum: mag resulted later 1.5, iv mag ordered. 7. Hyperlipidemia- patient was taking statin at home which has been discontinued this time due to the rhabdomyolysis, statin resumed. 8. Paranoid schizophrenia- psychotropic  medications held initially due to patient is unable to take  by mouth, resumed 9. DVT prophylaxis- heparin  Code Status: Full code Family Communication: No family at bedside Disposition Plan: transfer to med surg, discharge in 1-2 days ? SNF vs home  health   Consultants:  Urology  nephrology  Procedures:  None  Antibiotics:  Day 6 of abx as described above.  HPI/Subjective: 59 y.o. male h/o paranoid schizophrenia, found down at his home by brother who went to check up on him after he wasn't answering phone for a couple of days. Patient was found on floor at home, awake and responsive. Normally takes care of self without trouble. Patient admits to 2-3 day history of pain and burning on urination  Patient less confused, eating breakfast, reported 2 loose stool this am, denies ab pain, some psychomotor retardation, but cooperative  Objective: Filed Vitals:   01/19/15 0429  BP: 118/70  Pulse: 84  Temp: 97.9 F (36.6 C)  Resp: 24    Intake/Output Summary (Last 24 hours) at 01/19/15 0843 Last data filed at 01/19/15 0600  Gross per 24 hour  Intake   5940 ml  Output   8525 ml  Net  -2585 ml   Filed Weights   01/17/15 0500 01/18/15 0500 01/19/15 0500  Weight: 88.1 kg (194 lb 3.6 oz) 86 kg (189 lb 9.5 oz) 87.2 kg (192 lb 3.9 oz)    Exam:   General:  Appear in no acute distress  Cardiovascular: S1S2 RRR  Respiratory: Clear bilaterally  Abdomen: Soft, nontender  Musculoskeletal: No edema of the lower extremities  Data Reviewed: Basic Metabolic Panel:  Recent Labs Lab 01/15/15 0241 01/16/15 0225 01/17/15 0241 01/18/15 0252 01/19/15 0310  NA 146* 147* 142 144 142  K 2.9* 3.1* 2.8* 3.2* 2.9*  CL 115* 117* 111 113* 108  CO2 19* 22 24 25 25   GLUCOSE 122* 98 122* 121* 125*  BUN 19 19 16 11 12   CREATININE 2.61* 2.31* 2.05* 1.86* 1.70*  CALCIUM 8.0* 8.1* 7.8* 8.2* 8.4*   Liver Function Tests:  Recent Labs Lab 01/15/15 0241 01/16/15 0225  01/17/15 0241 01/18/15 0252 01/19/15 0310  AST 266* 204* 178* 101* 59*  ALT 122* 128* 134* 115* 93*  ALKPHOS 78 79 80 69 70  BILITOT 1.0 0.6 0.4 0.4 0.2*  PROT 5.8* 5.6* 5.5* 5.6* 5.2*  ALBUMIN 2.7* 2.4* 2.2* 2.4* 2.4*   No results for input(s): LIPASE,  AMYLASE in the last 168 hours. No results for input(s): AMMONIA in the last 168 hours. CBC:  Recent Labs Lab 01/13/15 1500 01/14/15 0237 01/15/15 0241  WBC 24.0* 26.2* 19.7*  NEUTROABS 21.9*  --   --   HGB 14.2 12.4* 12.8*  HCT 40.5 35.9* 37.4*  MCV 79.9 80.7 81.1  PLT 270 224 251   Cardiac Enzymes:  Recent Labs Lab 01/15/15 0241 01/16/15 0225 01/17/15 0241 01/18/15 0252 01/19/15 0310  CKTOTAL 29528* 11101* 6612* 2484* 1002*   BNP (last 3 results) No results for input(s): BNP in the last 8760 hours.  ProBNP (last 3 results) No results for input(s): PROBNP in the last 8760 hours.  CBG: No results for input(s): GLUCAP in the last 168 hours.  Recent Results (from the past 240 hour(s))  Urine culture     Status: None   Collection Time: 01/13/15  3:30 PM  Result Value Ref Range Status   Specimen Description URINE, CLEAN CATCH  Final   Special Requests NONE  Final   Culture   Final    >=100,000 COLONIES/mL ACINETOBACTER CALCOACETICUS/BAUMANNII COMPLEX >=100,000 COLONIES/mL ENTEROCOCCUS FAECALIS Performed at [facility]    Report Status 01/17/2015 FINAL  Final   Organism ID, Bacteria ACINETOBACTER CALCOACETICUS/BAUMANNII COMPLEX  Final   Organism ID, Bacteria ENTEROCOCCUS FAECALIS  Final      Susceptibility   Acinetobacter calcoaceticus/baumannii complex - MIC*    CEFTAZIDIME 4 SENSITIVE Sensitive     CEFTRIAXONE 16 INTERMEDIATE Intermediate     CIPROFLOXACIN <=0.25 SENSITIVE Sensitive     GENTAMICIN <=1 SENSITIVE Sensitive     IMIPENEM <=0.25 SENSITIVE Sensitive     PIP/TAZO <=4 SENSITIVE Sensitive     TRIMETH/SULFA <=20 SENSITIVE Sensitive     AMPICILLIN/SULBACTAM <=2 SENSITIVE Sensitive     * >=100,000 COLONIES/mL ACINETOBACTER CALCOACETICUS/BAUMANNII COMPLEX   Enterococcus faecalis - MIC*    AMPICILLIN <=2 SENSITIVE Sensitive     LEVOFLOXACIN 1 SENSITIVE Sensitive     NITROFURANTOIN 32 SENSITIVE Sensitive     VANCOMYCIN 1 SENSITIVE Sensitive      LINEZOLID 2 SENSITIVE Sensitive     * >=100,000 COLONIES/mL ENTEROCOCCUS FAECALIS  Culture, blood (routine x 2)     Status: None (Preliminary result)   Collection Time: 01/13/15  4:00 PM  Result Value Ref Range Status   Specimen Description BLOOD RIGHT ANTECUBITAL  Final   Special Requests BOTTLES DRAWN AEROBIC AND ANAEROBIC  5CC EACH  Final   Culture  Setup Time   Final    GRAM NEGATIVE RODS AEROBIC BOTTLE ONLY CRITICAL RESULT CALLED TO, READ BACK BY AND VERIFIED WITH: J. LINDSAY @ 0836 ON [id number] BY Rhea Bleacher    Culture   Final    ACINETOBACTER CALCOACETICUS/BAUMANNII COMPLEX Performed at [facility]    Report Status PENDING  Incomplete   Organism ID, Bacteria ACINETOBACTER CALCOACETICUS/BAUMANNII COMPLEX  Final      Susceptibility   Acinetobacter calcoaceticus/baumannii complex - MIC*    CEFTAZIDIME 4 SENSITIVE Sensitive     CEFTRIAXONE 16 INTERMEDIATE Intermediate     CIPROFLOXACIN <=0.25 SENSITIVE Sensitive     GENTAMICIN <=1 SENSITIVE Sensitive     IMIPENEM <=0.25 SENSITIVE Sensitive  PIP/TAZO <=4 SENSITIVE Sensitive     TRIMETH/SULFA <=20 SENSITIVE Sensitive     AMPICILLIN/SULBACTAM <=2 SENSITIVE Sensitive     * ACINETOBACTER CALCOACETICUS/BAUMANNII COMPLEX  Culture, blood (routine x 2)     Status: None   Collection Time: 01/13/15  4:05 PM  Result Value Ref Range Status   Specimen Description BLOOD LEFT FOREARM  Final   Special Requests BOTTLES DRAWN AEROBIC AND ANAEROBIC 5CC EACH  Final   Culture   Final    NO GROWTH 5 DAYS Performed at [facility]    Report Status 01/18/2015 FINAL  Final  MRSA PCR Screening     Status: None   Collection Time: 01/13/15 10:02 PM  Result Value Ref Range Status   MRSA by PCR NEGATIVE NEGATIVE Final    Comment:        The GeneXpert MRSA Assay (FDA approved for NASAL specimens only), is one component of a comprehensive MRSA colonization surveillance program. It is not intended to diagnose MRSA infection nor to  guide or monitor treatment for MRSA infections.   Clostridium Difficile by PCR (not at [facility])     Status: None   Collection Time: 01/17/15 11:40 AM  Result Value Ref Range Status   C difficile by pcr NEGATIVE NEGATIVE Final     Studies: No results found.  Scheduled Meds: . ampicillin-sulbactam (UNASYN) IV  3 g Intravenous Q8H  . benztropine  0.5 mg Oral q morning - 10a  . clozapine  800 mg Oral QHS  . heparin  5,000 Units Subcutaneous 3 times per day  . nicotine  14 mg Transdermal Daily  . potassium chloride  40 mEq Oral Q4H  . sodium chloride  1,000 mL Intravenous Once  . sodium chloride  3 mL Intravenous Q12H  . tamsulosin  0.4 mg Oral QPC supper   Continuous Infusions: . 0.9 % NaCl with KCl 20 mEq / L      Principal Problem:   Sepsis secondary to UTI Active Problems:   Paranoid schizophrenia, chronic condition   AKI (acute kidney injury)   Rhabdomyolysis   HLD (hyperlipidemia)    Time spent: 35 min    Juelz Claar MDPhD  [facility] Pager [phone]. If 7PM-7AM, please contact night-coverage at [email or web address], password [facility] 01/19/2015, 8:43 AM  LOS: 6 days

## 2015-01-20 ENCOUNTER — Other Ambulatory Visit: Payer: Self-pay

## 2015-01-20 DIAGNOSIS — N1330 Unspecified hydronephrosis: Secondary | ICD-10-CM

## 2015-01-20 LAB — CBC
HCT: 39.1 % (ref 39.0–52.0)
HEMATOCRIT: 38.6 % — AB (ref 39.0–52.0)
Hemoglobin: 12.9 g/dL — ABNORMAL LOW (ref 13.0–17.0)
Hemoglobin: 12.8 g/dL — ABNORMAL LOW (ref 13.0–17.0)
MCH: 27.3 pg (ref 26.0–34.0)
MCH: 27.4 pg (ref 26.0–34.0)
MCHC: 33 g/dL (ref 30.0–36.0)
MCHC: 33.2 g/dL (ref 30.0–36.0)
MCV: 82.7 fL (ref 78.0–100.0)
MCV: 82.5 fL (ref 78.0–100.0)
PLATELETS: 383 10*3/uL (ref 150–400)
Platelets: 371 10*3/uL (ref 150–400)
RBC: 4.73 MIL/uL (ref 4.22–5.81)
RBC: 4.68 MIL/uL (ref 4.22–5.81)
RDW: 15.3 % (ref 11.5–15.5)
RDW: 15.5 % (ref 11.5–15.5)
WBC: 16.5 10*3/uL — ABNORMAL HIGH (ref 4.0–10.5)
WBC: 16.6 10*3/uL — ABNORMAL HIGH (ref 4.0–10.5)

## 2015-01-20 LAB — MAGNESIUM: MAGNESIUM: 1.8 mg/dL (ref 1.7–2.4)

## 2015-01-20 LAB — COMPREHENSIVE METABOLIC PANEL
ALT: 79 U/L — AB (ref 17–63)
AST: 45 U/L — AB (ref 15–41)
Albumin: 2.5 g/dL — ABNORMAL LOW (ref 3.5–5.0)
Alkaline Phosphatase: 68 U/L (ref 38–126)
Anion gap: 8 (ref 5–15)
BUN: 11 mg/dL (ref 6–20)
CALCIUM: 8.7 mg/dL — AB (ref 8.9–10.3)
CO2: 26 mmol/L (ref 22–32)
CREATININE: 1.63 mg/dL — AB (ref 0.61–1.24)
Chloride: 112 mmol/L — ABNORMAL HIGH (ref 101–111)
GFR calc Af Amer: 53 mL/min — ABNORMAL LOW (ref >60)
GFR calc non Af Amer: 46 mL/min — ABNORMAL LOW (ref >60)
Glucose, Bld: 99 mg/dL (ref 65–99)
Potassium: 3.5 mmol/L (ref 3.5–5.1)
SODIUM: 146 mmol/L — AB (ref 135–145)
TOTAL PROTEIN: 5.5 g/dL — AB (ref 6.5–8.1)
Total Bilirubin: 0.3 mg/dL (ref 0.3–1.2)

## 2015-01-20 LAB — CK: Total CK: 574 U/L — ABNORMAL HIGH (ref 49–397)

## 2015-01-20 MED ORDER — TAMSULOSIN HCL 0.4 MG PO CAPS: 0.4000 mg | ORAL_CAPSULE | Freq: Every day | ORAL | Status: DC

## 2015-01-20 MED ORDER — POTASSIUM CHLORIDE CRYS ER 20 MEQ PO TBCR
40.0000 meq | EXTENDED_RELEASE_TABLET | Freq: Once | ORAL | Status: AC
  Administered 2015-01-20: 40 meq via ORAL
  Filled 2015-01-20: qty 2

## 2015-01-20 MED ORDER — CIPROFLOXACIN HCL 500 MG PO TABS: 500.0000 mg | ORAL_TABLET | Freq: Two times a day (BID) | ORAL | Status: DC

## 2015-01-20 MED ORDER — FLORANEX PO PACK: 1.0000 g | PACK | Freq: Three times a day (TID) | ORAL | Status: DC

## 2015-01-20 NOTE — Progress Notes (Signed)
Physical Therapy Treatment Patient Details Name: Edwin [name] MRN: [id number] DOB: [birth year] Today's Date: 01/20/2015    History of Present Illness DESHAY BLUMENFELD is a 59 y.o. male h/o paranoid schizophrenia, found down at his home by brother who went to check up on him after he wasn't answering phone for a couple of days. Patient was found on floor at home, awake and responsive. Normally takes care of self without trouble. Patient admits to 2-3 day history of pain and burning on urination.    PT Comments    Pt cooperative today.  Walked all around floor with SBA only,  Pt inconsistent with his comments. Reviewed MD driving restrictions with pt for DC.  Marland Kitchene  Follow Up Recommendations  No PT follow up     Equipment Recommendations  None recommended by PT    Recommendations for Other Services       Precautions / Restrictions Precautions Precautions: Fall Restrictions Weight Bearing Restrictions: No    Mobility  Bed Mobility Overal bed mobility: Modified Independent                Transfers Overall transfer level: Needs assistance Equipment used: None Transfers: Sit to/from Stand Sit to Stand: Supervision Stand pivot transfers: Supervision       General transfer comment: min guard for balance and management of catheter tubing  Ambulation/Gait Ambulation/Gait assistance: Supervision Ambulation Distance (Feet): 330 Feet Assistive device: None Gait Pattern/deviations: Step-through pattern     General Gait Details: mildly unsteady, but able to self recover instability with  steppage or drift.   Stairs            Wheelchair Mobility    Modified Rankin (Stroke Patients Only)       Balance           Standing balance support: No upper extremity supported;During functional activity Standing balance-Leahy Scale: Fair                      Cognition Arousal/Alertness: Awake/alert Behavior During Therapy: WFL for tasks  assessed/performed Overall Cognitive Status: No family/caregiver present to determine baseline cognitive functioning Area of Impairment: Awareness                    Exercises Other Exercises Other Exercises: Pt did sit to stands x 5 reps with no hands from his chair.  encoruaged him to do this as an exercise to help get legs stronger    General Comments        Pertinent Vitals/Pain Pain Assessment: No/denies pain    Home Living                      Prior Function            PT Goals (current goals can now be found in the care plan section) Acute Rehab PT Goals Patient Stated Goal: HOME Progress towards PT goals: Progressing toward goals    Frequency       PT Plan Current plan remains appropriate    Co-evaluation             End of Session   Activity Tolerance: Patient tolerated treatment well Patient left: in chair;with call bell/phone within reach     Time: 1515-1530 PT Time Calculation (min) (ACUTE ONLY): 15 min  Charges:  $Gait Training: 8-22 mins                    G Codes:  Loyal Buba 01/20/2015, 3:37 PM 01/20/2015   Rande Lawman, PT

## 2015-01-20 NOTE — Progress Notes (Signed)
Occupational Therapy Treatment Patient Details Name: Edwin [name] MRN: [id number] DOB: Apr 12, 1956 Today's Date: 01/20/2015    History of present illness Edwin [name] is a 59 y.o. male h/o paranoid schizophrenia, found down at his home by brother who went to check up on him after he wasn't answering phone for a couple of days. Patient was found on floor at home, awake and responsive. Normally takes care of self without trouble. Patient admits to 2-3 day history of pain and burning on urination.   OT comments  Pt progressing towards acute OT goals. Focus of session was activity tolerance for ADLs in standing position. D/c plan remains appropriate.   Follow Up Recommendations  No OT follow up;Supervision - Intermittent    Equipment Recommendations  None recommended by OT    Recommendations for Other Services      Precautions / Restrictions Precautions Precautions: Fall       Mobility Bed Mobility Overal bed mobility: Modified Independent                Transfers Overall transfer level: Needs assistance Equipment used: None Transfers: Sit to/from Stand Sit to Stand: Supervision         General transfer comment: min guard for balance and management of catheter tubing    Balance           Standing balance support: No upper extremity supported;During functional activity Standing balance-Leahy Scale: Fair                     ADL       Grooming: Oral care;Min guard;Standing                               Functional mobility during ADLs: Min guard General ADL Comments: Pt ambulated to bathroom and completed oral care in standing position. Min guard for mobility with one LOB at end of session during ambulation with pt able to correct. Cues for safety with catheter management during transfers (awareness of tubing) and functional mobility      Vision                     Perception     Praxis      Cognition   Behavior  During Therapy: [facility] for tasks assessed/performed Overall Cognitive Status: No family/caregiver present to determine baseline cognitive functioning                       Extremity/Trunk Assessment               Exercises     Shoulder Instructions       General Comments      Pertinent Vitals/ Pain       Pain Assessment: No/denies pain  Home Living                                          Prior Functioning/Environment              Frequency Min 2X/week     Progress Toward Goals  OT Goals(current goals can now be found in the care plan section)  Progress towards OT goals: Progressing toward goals  Acute Rehab OT Goals Patient Stated Goal: HOME OT Goal Formulation: With patient Time For Goal Achievement: 02/01/15 Potential to  Achieve Goals: Good ADL Goals Pt Will Perform Grooming: Independently;standing Pt Will Perform Upper Body Bathing: Independently;sitting;standing Pt Will Perform Lower Body Bathing: Independently;sit to/from stand Pt Will Perform Upper Body Dressing: Independently;sitting Pt Will Perform Lower Body Dressing: Independently;sit to/from stand Pt Will Transfer to Toilet: Independently;ambulating;regular height toilet Pt Will Perform Toileting - Clothing Manipulation and hygiene: Independently;sit to/from stand Pt Will Perform Tub/Shower Transfer: Tub transfer;Independently  Plan Discharge plan remains appropriate    Co-evaluation                 End of Session     Activity Tolerance Patient tolerated treatment well   Patient Left in chair;with call bell/phone within reach   Nurse Communication Other (comment) (possible leakage from IV? catheter bag needs emptying)        Time: 1856-3149 OT Time Calculation (min): 25 min  Charges: OT General Charges $OT Visit: 1 Procedure OT Treatments $Self Care/Home Management : 23-37 mins  Hortencia Pilar 01/20/2015, 2:47 PM

## 2015-01-20 NOTE — Care Management (Signed)
[facility] unable to see patient as follow up due to patient having [facility] , MD name on Insurance Card will have to be changed before they are able to [place] . [name] aware and will have insurance card changed , in mean time would like to continue with [facility] .   First available appointment is February 02, 2015 at 1445 with Dr Jeanie Cooks at [address] . Appointment on discharge papers .   Magdalen Spatz RN BSN [phone]

## 2015-01-20 NOTE — Discharge Summary (Signed)
Discharge Summary  Edwin [name] KVQ:[id number] DOB: 05-09-1956  PCP: No primary care provider on file.  Admit date: 01/13/2015 Discharge date: 01/20/2015  Time spent: >2mins  Recommendations for Outpatient Follow-up:  1. F/u with PMD at [facility] in one week, pmd to repeat cbc/cmp/mag. pmd to f/u on final acute hepatitis result. pmd to continue monitor QTc intervals. 2. F/u with urology in two weeks for foley removal and repeat renal ultrasound.  Discharge Diagnoses:  Active Hospital Problems   Diagnosis Date Noted  . Sepsis secondary to UTI 01/13/2015  . Paranoid schizophrenia, chronic condition 01/13/2015  . AKI (acute kidney injury) 01/13/2015  . Rhabdomyolysis 01/13/2015  . HLD (hyperlipidemia) 01/13/2015    Resolved Hospital Problems   Diagnosis Date Noted Date Resolved  No resolved problems to display.    Discharge Condition: stable  Diet recommendation: heart healthy/carb modified  Filed Weights   01/17/15 0500 01/18/15 0500 01/19/15 0500  Weight: 88.1 kg (194 lb 3.6 oz) 86 kg (189 lb 9.5 oz) 87.2 kg (192 lb 3.9 oz)    History of present illness:  Edwin [name] is a 59 y.o. male h/o paranoid schizophrenia, found down at his home by brother who went to check up on him after he wasn't answering phone for a couple of days. Patient was found on floor at home, awake and responsive. Normally takes care of self without trouble. Patient admits to 2-3 day history of pain and burning on urination. No cough, no SOB, no N/V/D, no abd pain.  Work up in ED is consistent with sepsis, UTI as likely source, AKI, and CK showing rhabdomyolysis at just under 50k.  Hospital Course:  Principal Problem:   Sepsis secondary to UTI Active Problems:   Paranoid schizophrenia, chronic condition   AKI (acute kidney injury)   Rhabdomyolysis   HLD (hyperlipidemia)   Sepsis due to acinectobacter/enterococcus UTI and acinectobacter bacteremia-  patient initially started on vancomycin and Zosyn, was later changed to Rocephin. Blood cultures growing gram-negative rods, patient restarted on Zosyn. Final blood culture shows ACINETOBACTER CALCOACETICUS/BAUMANNII COMPLEX sensitive to Zosyn, antibiotic changed to Ceftazidime for narrowing the antibiotic coverage. Improving. Discussed with infectious disease Dr. Linus Salmons over the phone prior to discharge patient, he recommended cipro for additional three days to finish treatment.  Encephalopathy- patient is confused has a history of paranoid schizophrenia and UTI/sepsis contributing to the consfusion.  initially trying to remove IV and lines and required sitter at bedside.Confusion has much improved, currently seems at baseline.  Rhabdomyolysis- CK is trending down ,received ivf , nephrology consulted and signed off on 6/19  Acute kidney injury/bilateral hydronephrosis- secondary to postobstructive uropathy, patient's creatinine continue to improve. Renal ultrasound was done which showed severe bilateral hydronephrosis and distended bladder suggesting bladder outlet obstruction. Urology consulted, and recommend to start flomax and send home on foley and follow up as outpatient in 2 weeks  Transaminitis- slowly improving patient presented with elevated AST and ALT level likely from the rhabdomyolysis. AST /alt trending down. Low suspicion for hepatitis, acute hepatitis panel pending at discharge, pmd to f/u with final result.  Hypokalemia- replaceK , check mag. Addendum: mag resulted later 1.5, iv mag ordered.  Hyperlipidemia- patient was taking statin at home which has been discontinued this time due to the rhabdomyolysis, statin resumed at discharge  Paranoid schizophrenia- psychotropicmedications held initially due to patient is unable to take by mouth, resumed  Hypokalemia/hypomagnesemia: replaced.  QTc prolongation: initial on presentation 560, resolved, repeat QTc  prior to discharge  470  Code Status: Full code Family Communication: talked to patient's brother [name] over the phone [phone]) prior to discharge. Disposition Plan: home with home health RN/aids/social worker   Consultants:  Urology  Nephrology  Infectious disease Dr. Linus Salmons over the phone on 6/23  Procedures:  None  Antibiotics: abx as described above.  Discharge Exam: BP 121/75 mmHg  Pulse 89  Temp(Src) 98.7 F (37.1 C) (Oral)  Resp 18  Ht 5\' 11"  (1.803 m)  Wt 87.2 kg (192 lb 3.9 oz)  BMI 26.82 kg/m2  SpO2 98%    General: Appear in no acute distress  Cardiovascular: S1S2 RRR  Respiratory: Clear bilaterally  Abdomen: Soft, nontender  Musculoskeletal: No edema of the lower extremities   Discharge Instructions You were cared for by a hospitalist during your hospital stay. If you have any questions about your discharge medications or the care you received while you were in the hospital after you are discharged, you can call the unit and asked to speak with the hospitalist on call if the hospitalist that took care of you is not available. Once you are discharged, your primary care physician will handle any further medical issues. Please note that NO REFILLS for any discharge medications will be authorized once you are discharged, as it is imperative that you return to your primary care physician (or establish a relationship with a primary care physician if you do not have one) for your aftercare needs so that they can reassess your need for medications and monitor your lab values.      Discharge Instructions    Diet - low sodium heart healthy    Complete by:  As directed      Discharge instructions    Complete by:  As directed   Please keep foley in, please follow up with urology for foley removal in two weeks.     Driving Restrictions    Complete by:  As directed   Please refrain from driving until cleared by primary care physician.     Face-to-face encounter (required  for Medicare/Medicaid patients)    Complete by:  As directed   I Latrisha Coiro certify that this patient is under my care and that I, or a nurse practitioner or physician's assistant working with me, had a face-to-face encounter that meets the physician face-to-face encounter requirements with this patient on 01/20/2015. The encounter with the patient was in whole, or in part for the following medical condition(s) which is the primary reason for home health care (List medical condition): FTT  The encounter with the patient was in whole, or in part, for the following medical condition, which is the primary reason for home health care:  FTT  I certify that, based on my findings, the following services are medically necessary home health services:  Nursing  Reason for Medically Necessary Home Health Services:  Skilled Nursing- Change/Decline in Patient Status  My clinical findings support the need for the above services:  Cognitive impairments, dementia, or mental confusion  that make it unsafe to leave home  Further, I certify that my clinical findings support that this patient is homebound due to:  Mental confusion     Home Health    Complete by:  As directed   To provide the following care/treatments:   [place] work       Increase activity slowly    Complete by:  As directed  Medication List    TAKE these medications        benztropine 0.5 MG tablet  Commonly known as:  COGENTIN  Take 0.5 mg by mouth every morning.     ciprofloxacin 500 MG tablet  Commonly known as:  CIPRO  Take 1 tablet (500 mg total) by mouth 2 (two) times daily.     clozapine 200 MG tablet  Commonly known as:  CLOZARIL  Take 800 mg by mouth at bedtime.     lactobacillus Pack  Take 1 packet (1 g total) by mouth 3 (three) times daily with meals.     simvastatin 20 MG tablet  Commonly known as:  ZOCOR  Take 20 mg by mouth daily.     tamsulosin 0.4 MG Caps capsule  Commonly known as:   FLOMAX  Take 1 capsule (0.4 mg total) by mouth daily after supper.       No Known Allergies Follow-up Information    Follow up with MACDIARMID,SCOTT A, MD In 2 weeks.   Specialty:  Urology   Why:  hydronephrosis, foley in place   Contact information:   [place] [address] [phone]       Follow up with [facility]     In 1 week.   Why:  pmd to repeat cbc/bmp/mag in one week.   Contact information:   [address] [place] [address] [phone]       The results of significant diagnostics from this hospitalization (including imaging, microbiology, ancillary and laboratory) are listed below for reference.    Significant Diagnostic Studies: Dg Chest 2 View  01/13/2015   CLINICAL DATA:  Status post fall x3 with multifocal bruising. Altered mental status today. Initial encounter.  EXAM: CHEST  2 VIEW  COMPARISON:  None.  FINDINGS: The lungs are clear. Heart size is normal. No pneumothorax or pleural effusion.  IMPRESSION: No acute disease.   Electronically Signed   By: Inge Rise M.D.   On: 01/13/2015 16:17   Ct Head Wo Contrast  01/13/2015   CLINICAL DATA:  Pain following fall  EXAM: CT HEAD WITHOUT CONTRAST  CT CERVICAL SPINE WITHOUT CONTRAST  TECHNIQUE: Multidetector CT imaging of the head and cervical spine was performed following the standard protocol without intravenous contrast. Multiplanar CT image reconstructions of the cervical spine were also generated.  COMPARISON:  None.  FINDINGS: CT HEAD FINDINGS  The ventricles are normal in size and configuration. There is no intracranial mass, hemorrhage, extra-axial fluid collection, or midline shift. Gray-white compartments are normal. No acute infarct evident. There is a right frontal scalp hematoma. The bony calvarium appears intact. The mastoid air cells are clear.  CT CERVICAL SPINE FINDINGS  The patient is status post anterior screw and plate fixation  with fusion at C4 and C5. The screw and plate fixation device appears intact. There is ankylosis at C4-5.  There is no fracture or spondylolisthesis. Prevertebral soft tissues and predental space regions are normal. There is a large subchondral type cyst in the superior odontoid. There is bony overgrowth at the predental space but no predental space widening. There are anterior osteophytes at C3, C5, C6, and C7. There is facet hypertrophy at several levels. No disc extrusion or stenosis. There is exit foraminal narrowing at C6-7 bilaterally impressing on the respective exiting nerve roots at this level bilaterally.  IMPRESSION: CT head: Right frontal scalp hematoma. No intracranial mass, hemorrhage, or extra-axial fluid collection. Gray-white  compartments appear normal.  CT cervical spine: Multilevel osteoarthritic change. Postoperative change at C4 and C5. No fracture or spondylolisthesis.   Electronically Signed   By: Lowella Grip III M.D.   On: 01/13/2015 15:09   Ct Cervical Spine Wo Contrast  01/13/2015   : CT head and CT cervical spine reports are combined into a single dictation.   Electronically Signed   By: Lowella Grip III M.D.   On: 01/13/2015 15:13   US Renal  01/16/2015   CLINICAL DATA:  Renal failure.  EXAM: RENAL / URINARY TRACT ULTRASOUND COMPLETE  COMPARISON:  None.  FINDINGS: Right Kidney:  Length: 15.8 cm. There is severe hydronephrosis of the renal calices and pelvis. Proximal ureter is dilated to 2.4 cm.  Left Kidney:  Length: 16.6 cm. Severe hydronephrosis with proximal ureteral dilated to 2.4 cm.  Bladder:  Bladder is mildly distended to 760 cubic cm. Patient unable to void. Echogenic material within the bladder suggest debris.  IMPRESSION: Severe bilateral hydronephrosis and distended bladder suggests bladder outlet obstruction. Recommend Foley catheterization.  These results will be called to the ordering clinician or representative by the Radiologist Assistant, and  communication documented in the PACS or zVision Dashboard.   Electronically Signed   By: Suzy Bouchard M.D.   On: 01/16/2015 08:27    Microbiology: Recent Results (from the past 240 hour(s))  Urine culture     Status: None   Collection Time: 01/13/15  3:30 PM  Result Value Ref Range Status   Specimen Description URINE, CLEAN CATCH  Final   Special Requests NONE  Final   Culture   Final    >=100,000 COLONIES/mL ACINETOBACTER CALCOACETICUS/BAUMANNII COMPLEX >=100,000 COLONIES/mL ENTEROCOCCUS FAECALIS Performed at [facility]    Report Status 01/17/2015 FINAL  Final   Organism ID, Bacteria ACINETOBACTER CALCOACETICUS/BAUMANNII COMPLEX  Final   Organism ID, Bacteria ENTEROCOCCUS FAECALIS  Final      Susceptibility   Acinetobacter calcoaceticus/baumannii complex - MIC*    CEFTAZIDIME 4 SENSITIVE Sensitive     CEFTRIAXONE 16 INTERMEDIATE Intermediate     CIPROFLOXACIN <=0.25 SENSITIVE Sensitive     GENTAMICIN <=1 SENSITIVE Sensitive     IMIPENEM <=0.25 SENSITIVE Sensitive     PIP/TAZO <=4 SENSITIVE Sensitive     TRIMETH/SULFA <=20 SENSITIVE Sensitive     AMPICILLIN/SULBACTAM <=2 SENSITIVE Sensitive     * >=100,000 COLONIES/mL ACINETOBACTER CALCOACETICUS/BAUMANNII COMPLEX   Enterococcus faecalis - MIC*    AMPICILLIN <=2 SENSITIVE Sensitive     LEVOFLOXACIN 1 SENSITIVE Sensitive     NITROFURANTOIN 32 SENSITIVE Sensitive     VANCOMYCIN 1 SENSITIVE Sensitive     LINEZOLID 2 SENSITIVE Sensitive     * >=100,000 COLONIES/mL ENTEROCOCCUS FAECALIS  Culture, blood (routine x 2)     Status: None   Collection Time: 01/13/15  4:00 PM  Result Value Ref Range Status   Specimen Description BLOOD RIGHT ANTECUBITAL  Final   Special Requests BOTTLES DRAWN AEROBIC AND ANAEROBIC  5CC EACH  Final   Culture  Setup Time   Final    GRAM NEGATIVE RODS AEROBIC BOTTLE ONLY CRITICAL RESULT CALLED TO, READ BACK BY AND VERIFIED WITH: J. LINDSAY @ [id number] ON [id number] BY Rhea Bleacher    Culture   Final      ACINETOBACTER CALCOACETICUS/BAUMANNII COMPLEX Performed at [facility]    Report Status 01/19/2015 FINAL  Final   Organism ID, Bacteria ACINETOBACTER CALCOACETICUS/BAUMANNII COMPLEX  Final      Susceptibility   Acinetobacter calcoaceticus/baumannii  complex - MIC*    CEFTAZIDIME 4 SENSITIVE Sensitive     CEFTRIAXONE 16 INTERMEDIATE Intermediate     CIPROFLOXACIN <=0.25 SENSITIVE Sensitive     GENTAMICIN <=1 SENSITIVE Sensitive     IMIPENEM <=0.25 SENSITIVE Sensitive     PIP/TAZO <=4 SENSITIVE Sensitive     TRIMETH/SULFA <=20 SENSITIVE Sensitive     AMPICILLIN/SULBACTAM <=2 SENSITIVE Sensitive     * ACINETOBACTER CALCOACETICUS/BAUMANNII COMPLEX  Culture, blood (routine x 2)     Status: None   Collection Time: 01/13/15  4:05 PM  Result Value Ref Range Status   Specimen Description BLOOD LEFT FOREARM  Final   Special Requests BOTTLES DRAWN AEROBIC AND ANAEROBIC 5CC EACH  Final   Culture   Final    NO GROWTH 5 DAYS Performed at [facility]    Report Status 01/18/2015 FINAL  Final  MRSA PCR Screening     Status: None   Collection Time: 01/13/15 10:02 PM  Result Value Ref Range Status   MRSA by PCR NEGATIVE NEGATIVE Final    Comment:        The GeneXpert MRSA Assay (FDA approved for NASAL specimens only), is one component of a comprehensive MRSA colonization surveillance program. It is not intended to diagnose MRSA infection nor to guide or monitor treatment for MRSA infections.   Clostridium Difficile by PCR (not at [facility])     Status: None   Collection Time: 01/17/15 11:40 AM  Result Value Ref Range Status   C difficile by pcr NEGATIVE NEGATIVE Final  Culture, blood (routine x 2)     Status: None (Preliminary result)   Collection Time: 01/19/15  9:35 AM  Result Value Ref Range Status   Specimen Description BLOOD RIGHT HAND  Final   Special Requests BOTTLES DRAWN AEROBIC ONLY  5CC  Final   Culture NO GROWTH 1 DAY  Final   Report Status PENDING   Incomplete  Culture, blood (routine x 2)     Status: None (Preliminary result)   Collection Time: 01/19/15  9:45 AM  Result Value Ref Range Status   Specimen Description BLOOD LEFT HAND  Final   Special Requests BOTTLES DRAWN AEROBIC ONLY 3CC  Final   Culture NO GROWTH 1 DAY  Final   Report Status PENDING  Incomplete     Labs: Basic Metabolic Panel:  Recent Labs Lab 01/16/15 0225 01/17/15 0241 01/18/15 0252 01/19/15 0310 01/19/15 0948 01/20/15 0300  NA 147* 142 144 142  --  146*  K 3.1* 2.8* 3.2* 2.9*  --  3.5  CL 117* 111 113* 108  --  112*  CO2 22 24 25 25   --  26  GLUCOSE 98 122* 121* 125*  --  99  BUN 19 16 11 12   --  11  CREATININE 2.31* 2.05* 1.86* 1.70*  --  1.63*  CALCIUM 8.1* 7.8* 8.2* 8.4*  --  8.7*  MG  --   --   --   --  1.5* 1.8   Liver Function Tests:  Recent Labs Lab 01/16/15 0225 01/17/15 0241 01/18/15 0252 01/19/15 0310 01/20/15 0300  AST 204* 178* 101* 59* 45*  ALT 128* 134* 115* 93* 79*  ALKPHOS 79 80 69 70 68  BILITOT 0.6 0.4 0.4 0.2* 0.3  PROT 5.6* 5.5* 5.6* 5.2* 5.5*  ALBUMIN 2.4* 2.2* 2.4* 2.4* 2.5*   No results for input(s): LIPASE, AMYLASE in the last 168 hours. No results for input(s): AMMONIA in the last 168 hours. CBC:  Recent Labs Lab 01/13/15 1500 01/14/15 0237 01/15/15 0241 01/20/15 0300 01/20/15 0738  WBC 24.0* 26.2* 19.7* 16.5* 16.6*  NEUTROABS 21.9*  --   --   --   --   HGB 14.2 12.4* 12.8* 12.9* 12.8*  HCT 40.5 35.9* 37.4* 39.1 38.6*  MCV 79.9 80.7 81.1 82.7 82.5  PLT 270 224 251 383 371   Cardiac Enzymes:  Recent Labs Lab 01/16/15 0225 01/17/15 0241 01/18/15 0252 01/19/15 0310 01/20/15 0300  CKTOTAL 25053* 6612* 2484* 1002* 574*   BNP: BNP (last 3 results) No results for input(s): BNP in the last 8760 hours.  ProBNP (last 3 results) No results for input(s): PROBNP in the last 8760 hours.  CBG: No results for input(s): GLUCAP in the last 168 hours.     SignedFlorencia Reasons MD, PhD  [facility] 01/20/2015, 2:21 PM

## 2015-01-20 NOTE — Care Management (Addendum)
Osiah Haring ( cell [phone])  returned phone call .  He would like [place] for [place] and aide . [name] stated that he calls [place] frequently to check on him and did not get an answer so he went and found him on the floor. Jalik's phone was not working, however, [name] has had it fixed.  Patinet alos has a cell phone but had left it in his car.   [name] plans on checking on patient frequently . Referral made to [place] .   Patient's PCP is at [facility] , however, he does not want to return there for care . [name] in agreement . They are interested in [facility] and [facility] . Called same to make an appointment , however, currently closed for lunch . Will call back after 1400.   [name] would like attending to call him to discuss his brother's hospitalization , and bedside nurse to call when [facility] is ready for discharge today . Will give messages .   Magdalen Spatz RN BSN [phone]       Discussed home health with patient , patient in agreement , states his brother [name] [phone] frequently checks on him . Patient unsure who his PCP is . Patient consented to NCM to call patient's brother , same done voice mail left , awaiting call back. Magdalen Spatz RN BSN [phone]

## 2015-01-21 LAB — HEPATITIS PANEL, ACUTE
HCV Ab: <0.1 s/co ratio (ref 0.0–0.9)
HEP A IGM: NEGATIVE
Hep B C IgM: NEGATIVE
Hepatitis B Surface Ag: NEGATIVE

## 2015-01-24 LAB — CULTURE, BLOOD (ROUTINE X 2)
CULTURE: NO GROWTH
CULTURE: NO GROWTH

## 2015-03-02 ENCOUNTER — Other Ambulatory Visit: Payer: Self-pay | Admitting: Urology

## 2015-03-07 ENCOUNTER — Encounter (HOSPITAL_COMMUNITY): Payer: Self-pay

## 2015-03-07 ENCOUNTER — Encounter (HOSPITAL_COMMUNITY)
Admission: RE | Admit: 2015-03-07 | Discharge: 2015-03-07 | Disposition: A | Discharge status: Home / Self Care | Payer: Medicare Other | Source: Ambulatory Visit | Attending: Urology | Admitting: Urology

## 2015-03-07 DIAGNOSIS — Z79899 Other long term (current) drug therapy: Secondary | ICD-10-CM | POA: Diagnosis not present

## 2015-03-07 DIAGNOSIS — F419 Anxiety disorder, unspecified: Secondary | ICD-10-CM | POA: Diagnosis not present

## 2015-03-07 DIAGNOSIS — K219 Gastro-esophageal reflux disease without esophagitis: Secondary | ICD-10-CM | POA: Diagnosis not present

## 2015-03-07 DIAGNOSIS — F209 Schizophrenia, unspecified: Secondary | ICD-10-CM | POA: Diagnosis not present

## 2015-03-07 DIAGNOSIS — R972 Elevated prostate specific antigen [PSA]: Secondary | ICD-10-CM | POA: Diagnosis present

## 2015-03-07 DIAGNOSIS — N1330 Unspecified hydronephrosis: Secondary | ICD-10-CM | POA: Diagnosis not present

## 2015-03-07 DIAGNOSIS — E780 Pure hypercholesterolemia: Secondary | ICD-10-CM | POA: Diagnosis not present

## 2015-03-07 DIAGNOSIS — N401 Enlarged prostate with lower urinary tract symptoms: Secondary | ICD-10-CM | POA: Diagnosis not present

## 2015-03-07 DIAGNOSIS — F329 Major depressive disorder, single episode, unspecified: Secondary | ICD-10-CM | POA: Diagnosis not present

## 2015-03-07 DIAGNOSIS — N423 Dysplasia of prostate: Secondary | ICD-10-CM | POA: Diagnosis not present

## 2015-03-07 DIAGNOSIS — N3080 Other cystitis without hematuria: Secondary | ICD-10-CM | POA: Diagnosis not present

## 2015-03-07 DIAGNOSIS — R338 Other retention of urine: Secondary | ICD-10-CM | POA: Diagnosis not present

## 2015-03-07 DIAGNOSIS — F17210 Nicotine dependence, cigarettes, uncomplicated: Secondary | ICD-10-CM | POA: Diagnosis not present

## 2015-03-07 DIAGNOSIS — C61 Malignant neoplasm of prostate: Secondary | ICD-10-CM | POA: Diagnosis not present

## 2015-03-07 HISTORY — DX: Gastro-esophageal reflux disease without esophagitis: K21.9

## 2015-03-07 HISTORY — DX: Anxiety disorder, unspecified: F41.9

## 2015-03-07 HISTORY — DX: Depression, unspecified: F32.A

## 2015-03-07 HISTORY — DX: Major depressive disorder, single episode, unspecified: F32.9

## 2015-03-07 LAB — BASIC METABOLIC PANEL
Anion gap: 10 (ref 5–15)
BUN: 13 mg/dL (ref 6–20)
CALCIUM: 8.9 mg/dL (ref 8.9–10.3)
CHLORIDE: 99 mmol/L — AB (ref 101–111)
CO2: 26 mmol/L (ref 22–32)
CREATININE: 1.49 mg/dL — AB (ref 0.61–1.24)
GFR calc Af Amer: 59 mL/min — ABNORMAL LOW (ref >60)
GFR calc non Af Amer: 51 mL/min — ABNORMAL LOW (ref >60)
Glucose, Bld: 102 mg/dL — ABNORMAL HIGH (ref 65–99)
POTASSIUM: 3.7 mmol/L (ref 3.5–5.1)
Sodium: 135 mmol/L (ref 135–145)

## 2015-03-07 LAB — CBC
HCT: 38.9 % — ABNORMAL LOW (ref 39.0–52.0)
Hemoglobin: 13 g/dL (ref 13.0–17.0)
MCH: 27.7 pg (ref 26.0–34.0)
MCHC: 33.4 g/dL (ref 30.0–36.0)
MCV: 82.9 fL (ref 78.0–100.0)
PLATELETS: 541 10*3/uL — AB (ref 150–400)
RBC: 4.69 MIL/uL (ref 4.22–5.81)
RDW: 14.7 % (ref 11.5–15.5)
WBC: 13.5 10*3/uL — AB (ref 4.0–10.5)

## 2015-03-07 NOTE — Patient Instructions (Addendum)
Edwin [name]  03/07/2015   Your procedure is scheduled on: Tuesday 03/08/15  Report to [facility]  Entrance take [facility]  elevators to 3rd floor to  [place] at 08:30 AM.  Call this number if you have problems the morning of surgery [phone]   Remember: ONLY 1 PERSON MAY GO WITH YOU TO SHORT STAY TO GET  READY MORNING OF [place].  Do not eat food or drink liquids :After Midnight.     Take these medicines the morning of surgery with A SIP OF WATER: cogentin, zocor                               You may not have any metal on your body including hair pins and              piercings  Do not wear jewelry, make-up, lotions, powders or perfumes, deodorant             Do not wear nail polish.  Do not shave  48 hours prior to surgery.              Men may shave face and neck.  Do not bring valuables to the hospital. [place].  Contacts, dentures or bridgework may not be worn into surgery.     Patients discharged the day of surgery will not be allowed to drive home.  Name and phone number of your driver: Edwin [name] [phone]  _____________________________________________________________________           [facility] - Preparing for Surgery Before surgery, you can play an important role.  Because skin is not sterile, your skin needs to be as free of germs as possible.  You can reduce the number of germs on your skin by washing with CHG (chlorahexidine gluconate) soap before surgery.  CHG is an antiseptic cleaner which kills germs and bonds with the skin to continue killing germs even after washing. Please DO NOT use if you have an allergy to CHG or antibacterial soaps.  If your skin becomes reddened/irritated stop using the CHG and inform your nurse when you arrive at Short Stay. Do not shave (including legs and underarms) for at least 48 hours prior to the first CHG shower.  You may shave your  face/neck. Please follow these instructions carefully:  1.  Shower with CHG Soap the night before surgery and the  morning of Surgery.  2.  If you choose to wash your hair, wash your hair first as usual with your  normal  shampoo.  3.  After you shampoo, rinse your hair and body thoroughly to remove the  shampoo.                            4.  Use CHG as you would any other liquid soap.  You can apply chg directly  to the skin and wash                       Gently with a scrungie or clean washcloth.  5.  Apply the CHG Soap to your body ONLY FROM THE NECK DOWN.   Do not use on face/ open  Wound or open sores. Avoid contact with eyes, ears mouth and genitals (private parts).                       Wash face,  Genitals (private parts) with your normal soap.             6.  Wash thoroughly, paying special attention to the area where your surgery  will be performed.  7.  Thoroughly rinse your body with warm water from the neck down.  8.  DO NOT shower/wash with your normal soap after using and rinsing off  the CHG Soap.                9.  Pat yourself dry with a clean towel.            10.  Wear clean pajamas.            11.  Place clean sheets on your bed the night of your first shower and do not  sleep with pets. Day of Surgery : Do not apply any lotions/deodorants the morning of surgery.  Please wear clean clothes to the hospital/surgery center.  FAILURE TO FOLLOW THESE INSTRUCTIONS MAY RESULT IN THE CANCELLATION OF YOUR SURGERY PATIENT SIGNATURE_________________________________  NURSE SIGNATURE__________________________________  ________________________________________________________________________

## 2015-03-07 NOTE — Progress Notes (Signed)
Chest x-ray 01/13/15 on EPIC, EKG 01/20/15 on EPIC

## 2015-03-08 ENCOUNTER — Encounter (HOSPITAL_COMMUNITY)
Admission: RE | Disposition: A | Discharge status: Home / Self Care | Payer: Self-pay | Source: Ambulatory Visit | Attending: Urology

## 2015-03-08 ENCOUNTER — Ambulatory Visit (HOSPITAL_COMMUNITY)
Admission: RE | Admit: 2015-03-08 | Discharge: 2015-03-08 | Disposition: A | Discharge status: Home / Self Care | Payer: Medicare Other | Source: Ambulatory Visit | Attending: Urology | Admitting: Urology

## 2015-03-08 ENCOUNTER — Encounter (HOSPITAL_COMMUNITY): Payer: Self-pay | Admitting: *Deleted

## 2015-03-08 ENCOUNTER — Ambulatory Visit (HOSPITAL_COMMUNITY): Payer: Medicare Other | Admitting: Anesthesiology

## 2015-03-08 DIAGNOSIS — N1330 Unspecified hydronephrosis: Secondary | ICD-10-CM | POA: Diagnosis not present

## 2015-03-08 DIAGNOSIS — C61 Malignant neoplasm of prostate: Secondary | ICD-10-CM | POA: Diagnosis not present

## 2015-03-08 DIAGNOSIS — Z79899 Other long term (current) drug therapy: Secondary | ICD-10-CM | POA: Insufficient documentation

## 2015-03-08 DIAGNOSIS — F209 Schizophrenia, unspecified: Secondary | ICD-10-CM | POA: Insufficient documentation

## 2015-03-08 DIAGNOSIS — F17210 Nicotine dependence, cigarettes, uncomplicated: Secondary | ICD-10-CM | POA: Insufficient documentation

## 2015-03-08 DIAGNOSIS — R338 Other retention of urine: Secondary | ICD-10-CM | POA: Insufficient documentation

## 2015-03-08 DIAGNOSIS — N401 Enlarged prostate with lower urinary tract symptoms: Secondary | ICD-10-CM | POA: Diagnosis not present

## 2015-03-08 DIAGNOSIS — F329 Major depressive disorder, single episode, unspecified: Secondary | ICD-10-CM | POA: Insufficient documentation

## 2015-03-08 DIAGNOSIS — K219 Gastro-esophageal reflux disease without esophagitis: Secondary | ICD-10-CM | POA: Insufficient documentation

## 2015-03-08 DIAGNOSIS — N423 Dysplasia of prostate: Secondary | ICD-10-CM | POA: Insufficient documentation

## 2015-03-08 DIAGNOSIS — E780 Pure hypercholesterolemia: Secondary | ICD-10-CM | POA: Insufficient documentation

## 2015-03-08 DIAGNOSIS — N3080 Other cystitis without hematuria: Secondary | ICD-10-CM | POA: Insufficient documentation

## 2015-03-08 DIAGNOSIS — F419 Anxiety disorder, unspecified: Secondary | ICD-10-CM | POA: Insufficient documentation

## 2015-03-08 HISTORY — PX: PROSTATE BIOPSY: SHX241

## 2015-03-08 HISTORY — PX: TRANSURETHRAL RESECTION OF BLADDER TUMOR: SHX2575

## 2015-03-08 HISTORY — PX: CYSTOSCOPY: SHX5120

## 2015-03-08 SURGERY — CYSTOSCOPY
Anesthesia: General

## 2015-03-08 MED ORDER — FENTANYL CITRATE (PF) 100 MCG/2ML IJ SOLN
INTRAMUSCULAR | Status: AC
  Filled 2015-03-08: qty 4

## 2015-03-08 MED ORDER — OXYCODONE HCL 5 MG PO TABS
5.0000 mg | ORAL_TABLET | Freq: Once | ORAL | Status: AC | PRN
  Administered 2015-03-08: 5 mg via ORAL
  Filled 2015-03-08: qty 1

## 2015-03-08 MED ORDER — MIDAZOLAM HCL 2 MG/2ML IJ SOLN
INTRAMUSCULAR | Status: AC
  Filled 2015-03-08: qty 4

## 2015-03-08 MED ORDER — DEXAMETHASONE SODIUM PHOSPHATE 10 MG/ML IJ SOLN
INTRAMUSCULAR | Status: AC
  Filled 2015-03-08: qty 1

## 2015-03-08 MED ORDER — PROMETHAZINE HCL 25 MG/ML IJ SOLN
6.2500 mg | INTRAMUSCULAR | Status: DC | PRN
Start: 2015-03-08 — End: 2015-03-08

## 2015-03-08 MED ORDER — MIDAZOLAM HCL 5 MG/5ML IJ SOLN
INTRAMUSCULAR | Status: DC | PRN
Start: 2015-03-08 — End: 2015-03-08
  Administered 2015-03-08: 2 mg via INTRAVENOUS

## 2015-03-08 MED ORDER — HYDROMORPHONE HCL 1 MG/ML IJ SOLN: 0.2500 mg | INTRAMUSCULAR | Status: DC | PRN

## 2015-03-08 MED ORDER — FENTANYL CITRATE (PF) 100 MCG/2ML IJ SOLN
INTRAMUSCULAR | Status: DC | PRN
  Administered 2015-03-08 (×3): 50 ug via INTRAVENOUS
  Administered 2015-03-08 (×2): 25 ug via INTRAVENOUS

## 2015-03-08 MED ORDER — PROPOFOL 10 MG/ML IV BOLUS
INTRAVENOUS | Status: AC
  Filled 2015-03-08: qty 20

## 2015-03-08 MED ORDER — SODIUM CHLORIDE 0.9 % IR SOLN
Status: DC | PRN
  Administered 2015-03-08: 3000 mL

## 2015-03-08 MED ORDER — PHENYLEPHRINE 40 MCG/ML (10ML) SYRINGE FOR IV PUSH (FOR BLOOD PRESSURE SUPPORT)
PREFILLED_SYRINGE | INTRAVENOUS | Status: AC
  Filled 2015-03-08: qty 10

## 2015-03-08 MED ORDER — DEXTROSE 5 % IV SOLN
2.0000 g | INTRAVENOUS | Status: AC
  Administered 2015-03-08: 2 g via INTRAVENOUS

## 2015-03-08 MED ORDER — ONDANSETRON HCL 4 MG/2ML IJ SOLN
INTRAMUSCULAR | Status: AC
Start: 2015-03-08 — End: 2015-03-08
  Filled 2015-03-08: qty 2

## 2015-03-08 MED ORDER — LIDOCAINE HCL (CARDIAC) 20 MG/ML IV SOLN
INTRAVENOUS | Status: AC
  Filled 2015-03-08: qty 5

## 2015-03-08 MED ORDER — LACTATED RINGERS IV SOLN
INTRAVENOUS | Status: DC | PRN
  Administered 2015-03-08 (×2): via INTRAVENOUS

## 2015-03-08 MED ORDER — PROPOFOL 10 MG/ML IV BOLUS
INTRAVENOUS | Status: DC | PRN
  Administered 2015-03-08: 200 mg via INTRAVENOUS

## 2015-03-08 MED ORDER — PHENYLEPHRINE HCL 10 MG/ML IJ SOLN
INTRAMUSCULAR | Status: DC | PRN
  Administered 2015-03-08: 80 ug via INTRAVENOUS

## 2015-03-08 MED ORDER — OXYCODONE HCL 5 MG/5ML PO SOLN
5.0000 mg | Freq: Once | ORAL | Status: AC | PRN
  Filled 2015-03-08: qty 5

## 2015-03-08 MED ORDER — DEXTROSE 5 % IV SOLN
INTRAVENOUS | Status: AC
  Filled 2015-03-08: qty 2

## 2015-03-08 MED ORDER — ONDANSETRON HCL 4 MG/2ML IJ SOLN
INTRAMUSCULAR | Status: AC
  Filled 2015-03-08: qty 2

## 2015-03-08 MED ORDER — DEXAMETHASONE SODIUM PHOSPHATE 10 MG/ML IJ SOLN
INTRAMUSCULAR | Status: DC | PRN
  Administered 2015-03-08: 10 mg via INTRAVENOUS

## 2015-03-08 MED ORDER — ONDANSETRON HCL 4 MG/2ML IJ SOLN
INTRAMUSCULAR | Status: DC | PRN
Start: 2015-03-08 — End: 2015-03-08
  Administered 2015-03-08: 4 mg via INTRAVENOUS

## 2015-03-08 MED ORDER — LIDOCAINE HCL (CARDIAC) 20 MG/ML IV SOLN
INTRAVENOUS | Status: DC | PRN
  Administered 2015-03-08: 100 mg via INTRAVENOUS

## 2015-03-08 SURGICAL SUPPLY — 21 items
BAG URINE DRAINAGE (UROLOGICAL SUPPLIES) ×2 IMPLANT
BAG URO CATCHER STRL LF (DRAPE) ×3 IMPLANT
CATH FOLEY 2WAY SLVR  5CC 20FR (CATHETERS) ×2
CATH FOLEY 2WAY SLVR 5CC 20FR (CATHETERS) IMPLANT
CATH ROBINSON RED A/P 16FR (CATHETERS) IMPLANT
CLOTH BEACON ORANGE TIMEOUT ST (SAFETY) ×3 IMPLANT
ELECT REM PT RETURN 9FT ADLT (ELECTROSURGICAL) ×3
ELECTRODE REM PT RTRN 9FT ADLT (ELECTROSURGICAL) ×1 IMPLANT
GLOVE BIOGEL M 7.0 STRL (GLOVE) ×3 IMPLANT
GOWN STRL REUS W/ TWL XL LVL3 (GOWN DISPOSABLE) ×1 IMPLANT
GOWN STRL REUS W/TWL LRG LVL3 (GOWN DISPOSABLE) ×6 IMPLANT
GOWN STRL REUS W/TWL XL LVL3 (GOWN DISPOSABLE) ×3
KIT ASPIRATION TUBING (SET/KITS/TRAYS/PACK) IMPLANT
LOOP CUT BIPOLAR 24F LRG (ELECTROSURGICAL) ×2 IMPLANT
MANIFOLD NEPTUNE II (INSTRUMENTS) ×3 IMPLANT
NS IRRIG 1000ML POUR BTL (IV SOLUTION) ×3 IMPLANT
PACK CYSTO (CUSTOM PROCEDURE TRAY) ×3 IMPLANT
PEN SKIN MARKING BROAD (MISCELLANEOUS) ×3 IMPLANT
SYRINGE IRR TOOMEY STRL 70CC (SYRINGE) ×2 IMPLANT
TUBING CONNECTING 10 (TUBING) ×2 IMPLANT
TUBING CONNECTING 10' (TUBING) ×1

## 2015-03-08 NOTE — Discharge Instructions (Signed)
Home with Foley Call for fever, gross blood in the urine or clogged catheter.  Indwelling Urinary Catheter Care You have been given a flexible tube (catheter) used to drain the bladder. Catheters are often used when a person has difficulty urinating due to blockage, bleeding, infection, or inability to control bladder or bowel movements (incontinence). A catheter requires daily care to prevent infection and blockage. HOME CARE INSTRUCTIONS  Do the following to reduce the risk of infection. Antibiotic medicines cannot prevent infections. Limit the number of bacteria entering your bladder  Wash your hands for 2 minutes with soapy water before and after handling the catheter.  Wash your bottom and the entire catheter twice daily, as well as after each bowel movement. Wash the tip of the penis or just above the vaginal opening with soap and warm water, rinse, and then wash the rectal area. Always wash from front to back.  When changing from the leg bag to overnight bag or from the overnight bag to leg bag, thoroughly clean the end of the catheter where it connects to the tubing with an alcohol wipe.  Clean the leg bag and overnight bag daily after use. Replace your drainage bags weekly.  Always keep the tubing and bag below the level of your bladder. This allows your urine to drain properly. Lifting the bag or tubing above the level of your bladder will cause dirty urine to flow back into your bladder. If you must briefly lift the bag higher than your bladder, pinch the catheter or tubing to prevent backflow.  Drink enough water and fluids to keep your urine clear or pale yellow, or as directed by your caregiver. This will flush bacteria out of the bladder. Protect tissues from injury  Attach the catheter to your leg so there is no tension on the catheter. Use adhesive tape or a leg strap. If you are using adhesive tape, remove any sticky residue left behind by the previous tape you used.  Place  your leg bag on your lower leg. Fasten the straps securely and comfortably.  Do not remove the catheter yourself unless you have been instructed how to do so. Keep the urinary pathway open  Check throughout the day to be sure your catheter is working and urine is draining freely. Make sure the tubing does not become kinked.  Do not let the drainage bag overfill. SEEK IMMEDIATE MEDICAL CARE IF:   The catheter becomes blocked. Urine is not draining.  Urine is leaking.  You have any pain.  You have a fever. Document Released: 07/16/2005 Document Revised: 07/02/2012 Document Reviewed: 12/15/2009 [facility] Patient Information 2015 [place], [place]. This information is not intended to replace advice given to you by your health care provider. Make sure you discuss any questions you have with your health care provider.

## 2015-03-08 NOTE — Progress Notes (Signed)
Placed on contact isolation for MDRO

## 2015-03-08 NOTE — Anesthesia Preprocedure Evaluation (Addendum)
Anesthesia Evaluation  Patient identified by MRN, date of birth, ID band Patient awake    Reviewed: Allergy & Precautions, NPO status , Patient's Chart, lab work & pertinent test results  Airway Mallampati: II  TM Distance: >3 FB Neck ROM: Full    Dental   Pulmonary Current Smoker,  breath sounds clear to auscultation        Cardiovascular negative cardio ROS  Rhythm:Regular Rate:Normal     Neuro/Psych Anxiety Depression Schizophrenia negative neurological ROS     GI/Hepatic Neg liver ROS, GERD-  ,  Endo/Other  negative endocrine ROS  Renal/GU CRFRenal disease     Musculoskeletal   Abdominal   Peds  Hematology negative hematology ROS (+)   Anesthesia Other Findings   Reproductive/Obstetrics                            Anesthesia Physical Anesthesia Plan  ASA: II  Anesthesia Plan: General   Post-op Pain Management:    Induction: Intravenous  Airway Management Planned: LMA  Additional Equipment:   Intra-op Plan:   Post-operative Plan: Extubation in OR  Informed Consent: I have reviewed the patients History and Physical, chart, labs and discussed the procedure including the risks, benefits and alternatives for the proposed anesthesia with the patient or authorized representative who has indicated his/her understanding and acceptance.   Dental advisory given  Plan Discussed with: CRNA  Anesthesia Plan Comments:         Anesthesia Quick Evaluation

## 2015-03-08 NOTE — Transfer of Care (Signed)
Immediate Anesthesia Transfer of Care Note  Patient: Edwin [name]  Procedure(s) Performed: Procedure(s) with comments: CYSTOSCOPY (N/A) - 1 HOUR REQUESTED FOR THIS CASE  **[facility] TO PROVIDE ULTR[facility]OUND [place]**  [place] [address] [phone] [place] GRP # [id number] ID # [id number] TRANSURETHRAL RESECTION OF BLADDER TUMOR (TURBT) (N/A) - TUR BIOPSY BLADDER BIOPSY TRANSRECTAL ULTRASONIC PROSTATE (TUBP) (N/A)  Patient Location: PACU  Anesthesia Type:General  Level of Consciousness: sedated  Airway & Oxygen Therapy: Patient Spontanous Breathing and Patient connected to face mask oxygen  Post-op Assessment: Report given to RN and Post -op Vital signs reviewed and stable  Post vital signs: Reviewed and stable  Last Vitals:  Filed Vitals:   03/08/15 0840  BP: 109/71  Pulse: 111  Temp: 36.9 C  Resp: 16    Complications: No apparent anesthesia complications

## 2015-03-08 NOTE — H&P (Signed)
History of Present Illness Edwin [name] returns for another voiding trial. Two weeks ago, he was evaluated after being discharged from the hospital. He failed a voiding trial then . A catheter was re-placed. He has been on tamsulosin. He notes good drainage from his catheter. He denies flank or back pain. He has been afebrile. Pt has a long standing psychiatric history and was hospitalized last month recently for AKI r/t urinary retention and acute psychosis. Renal ultrasound showed severe bilateral hydronephrosis. CT scan without contrast revealed bilateral hydronephrosis to the level of the urinary bladder, thickened bladder wall and enlarged prostate. Cystoscopy showed edematous bladder mucosa without definite evidence of bladder tumor. His PSA is 8.15. PVR after cystoscopy was 419 ml. He was then catheterized for 350 ml. The catheter was left indwelling.   Past Medical History Problems  1. History of Anxiety (F41.9) 2. History of depression (Z86.59) 3. History of hypercholesterolemia (Z86.39)  Surgical History Problems  1. History of Neck Surgery  Current Meds 1. Benztropine Mesylate 1 MG Oral Tablet;  Therapy: (Recorded:03Feb2014) to Recorded 2. CloZAPine TABS; 600mg  at night;  Therapy: (Recorded:29Jul2016) to Recorded 3. Simvastatin 20 MG Oral Tablet;  Therapy: (Recorded:03Feb2014) to Recorded 4. Tamsulosin HCl - 0.4 MG Oral Capsule; TAKE 1 CAPSULE Daily;  Therapy: [id number] to (Evaluate:05Oct2016); Last Rx:07Jul2016 Ordered 5. Tums E-X 750 CHEW;  Therapy: (Recorded:03Feb2014) to Recorded  Allergies Medication  1. No Known Drug Allergies  Family History Problems  1. Family history of Breast Cancer 2. Family history of Diabetes Mellitus 3. Family history of Father Deceased At Age ____ : Mother 92. Family history of Heart Disease 5. Family history of Mother Deceased At Age ____ : Mother  Social History Problems  1. Denied: History of Alcohol Use 2. Denied: History of  Caffeine Use 3. Current every day smoker (F17.200)   Has smoked for approx. 35 yrs,.  1 ppd 4. Currently On Disability 5. Marital History - Single 6. Tobacco use (Z72.0)   1 1/2 pk  a day for 40 yrs  Review of Systems Genitourinary, constitutional, skin, eye, otolaryngeal, hematologic/lymphatic, cardiovascular, pulmonary, endocrine, musculoskeletal, gastrointestinal, neurological and psychiatric system(s) were reviewed and pertinent findings if present are noted and are otherwise negative.    Vitals Vital Signs [Data Includes: Last 1 Day]  Recorded: 29Jul2016 02:01PM  Blood Pressure: 113 / 78 Temperature: 98.3 F Heart Rate: 100  Physical Exam Constitutional: Well nourished and well developed . No acute distress.  ENT:. The ears and nose are normal in appearance.  Neck: The appearance of the neck is normal and no neck mass is present.  Pulmonary: No respiratory distress and normal respiratory rhythm and effort.  Cardiovascular: Heart rate and rhythm are normal . No peripheral edema.  Abdomen: The abdomen is soft and nontender. No masses are palpated. No CVA tenderness. No hernias are palpable. No hepatosplenomegaly noted.  Genitourinary: Examination of the penis demonstrates no discharge, no masses, no lesions and a normal meatus. The scrotum is without lesions. The right epididymis is palpably normal and non-tender. The left epididymis is palpably normal and non-tender. The right testis is non-tender and without masses. The left testis is non-tender and without masses.  Lymphatics: The femoral and inguinal nodes are not enlarged or tender.  Skin: Normal skin turgor, no visible rash and no visible skin lesions.  Neuro/Psych:. Mood and affect are appropriate.    Results/Data  Uroflow: 0   He was unable to void.1       1 Amended By: Altamese Dilling  Edwin [name]; Feb 25 2015 5:56 PM EST  Procedure  Procedure: Cystoscopy   Indication: Lower Urinary Tract Symptoms. Thickened bladder wall.   Informed Consent: Risks, benefits, and potential adverse events were discussed and informed consent was obtained from the patient . Specific risks including, but not limited to bleeding, infection, pain, allergic reaction etc. were explained.  Prep: The patient was prepped with betadine.  Anesthesia:. Local anesthesia was administered intraurethrally with 2% lidocaine jelly.  Antibiotic prophylaxis: Ciprofloxacin.  Procedure Note:  Urethral meatus:. No abnormalities.  Anterior urethra: No abnormalities.  Prostatic urethra:. The lateral and median prostatic lobes were enlarged.  Bladder: Visulization was clear. The ureteral orifices were in the normal anatomic position bilaterally. Examination of the bladder demonstrated moderate trabeculation edema located on the anterior aspect, on the left side of the bladder . The patient tolerated the procedure well.  Complications: None.    Assessment Assessed  1. Bilateral hydronephrosis (N13.30) 2. Elevated prostate specific antigen (PSA) (R97.2) 3. Urinary retention (R33.9)  Plan Elevated prostate specific antigen (PSA)  1. Start: Levofloxacin 500 MG Oral Tablet (Levaquin); 1 tablet the day before the procedure,  1 tablet the day of the procedure, and 1 tablet the day after the procedure  Leave catheter indwelling. Cipro 500 mgm x 1. He needs cystoscopy, bladder biopsy, ultrasound prostate biopsy under anesthesia. The procedure, risks, benefits were discussed with the patient and his brother. The risks include but are not limited to hemorrhage, infection, bladder injury. They understand and they are agreeable.

## 2015-03-08 NOTE — Op Note (Signed)
Edwin [name] is a 59 y.o.   03/08/2015  General  Preop diagnosis: Elevated PSA. Bladder mass  Postop diagnosis: Same  Procedure done: Ultrasound guided prostate biopsy. Cystoscopy and biopsy of bladder mass.  Surgeon: Charlene Brooke. Edwin [name]  Anesthesia: Gen.  Indication: Patient is a 59 years old male who went into urinary retention last month. He failed 2 voiding trials. Renal ultrasound showed severe bilateral hydronephrosis. CT scan without contrast revealed bilateral hydronephrosis to the level of the urinary bladder, thickened bladder wall. Cystoscopy showed edematous bladder mucosa on the left side without definite evidence of bladder tumor. His PSA is also elevated at 8.15. He is scheduled today for ultrasound-guided prostate biopsy, cystoscopy and bladder biopsy.  Procedure: Patient was identified by his wrist band and proper timeout was taken.  Under general anesthesia he was prepped and draped and placed in the left lateral decubitus position. An ultrasound transducer was inserted in the rectum. The seminal vesicles appear normal. The prostate is enlarged without definite evidence of hypoechoic nodules. The prostate volume is 77.47 mL. The prostate width is 5.97 centimeters, the prostate height is 4.42 cm and the prostate width is 5.61 cm.  A biopsy of the right lateral base was done. A biopsy of the right medial base was done. Biopsy of the right lateral mid gland and right medial mid gland was done. Then biopsy of the right lateral apex and right medial apex was done.  The biopsy of the left lateral base and left medial base was done. Biopsy of the left lateral mid gland and left medial mid gland was performed. Biopsy of the left lateral apex and left medial apex was also done. The transducer was then removed.  The patient was then placed in the dorsolithotomy position. A cystoscope was then inserted in the bladder. The anterior urethra is normal. There is trilobar prostatic hypertrophy.  There is marked bullous edema of the left lateral wall of the bladder without definite evidence of papillary tumor. A cold cup biopsy forceps of the edematous area of the left lateral wall of the bladder was done. The cystoscope was then removed.  A resectoscope was then inserted in the bladder. Transurethral resection of the edematous area of the left side of the bladder was done with the loop. Hemostasis was secured with electrocautery. The specimen was then irrigated out of the bladder with a [place]y syringe. The specimen was sent to pathology for definitive diagnosis. The bladder was then irrigated with normal saline and all blood clots were irrigated out. The resectoscope was then removed. A #20 French Foley catheter was then inserted in the bladder.  The patient tolerated the procedure well and left the OR in satisfactory condition to postanesthesia care unit.  EBL: Minimal

## 2015-03-09 ENCOUNTER — Encounter (HOSPITAL_COMMUNITY): Payer: Self-pay | Admitting: Urology

## 2015-03-09 NOTE — Anesthesia Postprocedure Evaluation (Signed)
  Anesthesia Post-op Note  Patient: Edwin [name]  Procedure(s) Performed: Procedure(s) with comments: CYSTOSCOPY (N/A) - 1 HOUR REQUESTED FOR THIS CASE  **[facility] TO PROVIDE ULTRASOUND [place]**  [place] [address] [phone] [place] GRP # [id number] ID # [id number] TRANSURETHRAL RESECTION OF BLADDER TUMOR (TURBT) (N/A) - TUR BIOPSY BLADDER BIOPSY TRANSRECTAL ULTRASONIC PROSTATE (TUBP) (N/A)  Patient Location: PACU  Anesthesia Type:General  Level of Consciousness: awake and alert   Airway and Oxygen Therapy: Patient Spontanous Breathing  Post-op Pain: none  Post-op Assessment: Post-op Vital signs reviewed              Post-op Vital Signs: Reviewed  Last Vitals:  Filed Vitals:   03/08/15 1445  BP: 113/73  Pulse: 90  Temp:   Resp: 18    Complications: No apparent anesthesia complications

## 2015-03-31 ENCOUNTER — Emergency Department (HOSPITAL_COMMUNITY): Payer: Medicare Other

## 2015-03-31 ENCOUNTER — Inpatient Hospital Stay (HOSPITAL_COMMUNITY): Payer: Medicare Other

## 2015-03-31 ENCOUNTER — Inpatient Hospital Stay (HOSPITAL_COMMUNITY)
Admission: EM | Admit: 2015-03-31 | Discharge: 2015-04-07 | DRG: 698 | Disposition: A | Discharge status: Skilled Nursing Facility | Payer: Medicare Other | Attending: Internal Medicine | Admitting: Internal Medicine

## 2015-03-31 ENCOUNTER — Encounter (HOSPITAL_COMMUNITY): Payer: Self-pay | Admitting: Emergency Medicine

## 2015-03-31 DIAGNOSIS — A0472 Enterocolitis due to Clostridium difficile, not specified as recurrent: Secondary | ICD-10-CM

## 2015-03-31 DIAGNOSIS — N19 Unspecified kidney failure: Secondary | ICD-10-CM

## 2015-03-31 DIAGNOSIS — E860 Dehydration: Secondary | ICD-10-CM | POA: Diagnosis present

## 2015-03-31 DIAGNOSIS — Y846 Urinary catheterization as the cause of abnormal reaction of the patient, or of later complication, without mention of misadventure at the time of the procedure: Secondary | ICD-10-CM | POA: Diagnosis present

## 2015-03-31 DIAGNOSIS — Z9181 History of falling: Secondary | ICD-10-CM | POA: Diagnosis not present

## 2015-03-31 DIAGNOSIS — N390 Urinary tract infection, site not specified: Secondary | ICD-10-CM | POA: Diagnosis present

## 2015-03-31 DIAGNOSIS — R5383 Other fatigue: Secondary | ICD-10-CM

## 2015-03-31 DIAGNOSIS — N189 Chronic kidney disease, unspecified: Secondary | ICD-10-CM | POA: Diagnosis present

## 2015-03-31 DIAGNOSIS — R140 Abdominal distension (gaseous): Secondary | ICD-10-CM | POA: Diagnosis present

## 2015-03-31 DIAGNOSIS — F419 Anxiety disorder, unspecified: Secondary | ICD-10-CM | POA: Diagnosis present

## 2015-03-31 DIAGNOSIS — K219 Gastro-esophageal reflux disease without esophagitis: Secondary | ICD-10-CM | POA: Diagnosis present

## 2015-03-31 DIAGNOSIS — Z79899 Other long term (current) drug therapy: Secondary | ICD-10-CM

## 2015-03-31 DIAGNOSIS — E872 Acidosis: Secondary | ICD-10-CM | POA: Diagnosis present

## 2015-03-31 DIAGNOSIS — G9340 Encephalopathy, unspecified: Secondary | ICD-10-CM | POA: Diagnosis not present

## 2015-03-31 DIAGNOSIS — N1330 Unspecified hydronephrosis: Secondary | ICD-10-CM | POA: Diagnosis present

## 2015-03-31 DIAGNOSIS — T8351XA Infection and inflammatory reaction due to indwelling urinary catheter, initial encounter: Secondary | ICD-10-CM | POA: Diagnosis present

## 2015-03-31 DIAGNOSIS — I959 Hypotension, unspecified: Secondary | ICD-10-CM | POA: Diagnosis present

## 2015-03-31 DIAGNOSIS — R4182 Altered mental status, unspecified: Secondary | ICD-10-CM | POA: Diagnosis not present

## 2015-03-31 DIAGNOSIS — A047 Enterocolitis due to Clostridium difficile: Secondary | ICD-10-CM | POA: Diagnosis present

## 2015-03-31 DIAGNOSIS — E871 Hypo-osmolality and hyponatremia: Secondary | ICD-10-CM | POA: Diagnosis present

## 2015-03-31 DIAGNOSIS — F200 Paranoid schizophrenia: Secondary | ICD-10-CM | POA: Diagnosis present

## 2015-03-31 DIAGNOSIS — N179 Acute kidney failure, unspecified: Secondary | ICD-10-CM | POA: Diagnosis not present

## 2015-03-31 DIAGNOSIS — F17210 Nicotine dependence, cigarettes, uncomplicated: Secondary | ICD-10-CM | POA: Diagnosis present

## 2015-03-31 DIAGNOSIS — G9341 Metabolic encephalopathy: Secondary | ICD-10-CM | POA: Diagnosis present

## 2015-03-31 DIAGNOSIS — A419 Sepsis, unspecified organism: Secondary | ICD-10-CM | POA: Diagnosis not present

## 2015-03-31 DIAGNOSIS — N423 Dysplasia of prostate: Secondary | ICD-10-CM | POA: Diagnosis present

## 2015-03-31 DIAGNOSIS — Z8546 Personal history of malignant neoplasm of prostate: Secondary | ICD-10-CM | POA: Diagnosis not present

## 2015-03-31 DIAGNOSIS — F329 Major depressive disorder, single episode, unspecified: Secondary | ICD-10-CM | POA: Diagnosis present

## 2015-03-31 DIAGNOSIS — E876 Hypokalemia: Secondary | ICD-10-CM | POA: Diagnosis present

## 2015-03-31 LAB — COMPREHENSIVE METABOLIC PANEL
ALBUMIN: 2.4 g/dL — AB (ref 3.5–5.0)
ALK PHOS: 116 U/L (ref 38–126)
ALT: 48 U/L (ref 17–63)
AST: 60 U/L — AB (ref 15–41)
Anion gap: 16 — ABNORMAL HIGH (ref 5–15)
BUN: 77 mg/dL — AB (ref 6–20)
CALCIUM: 7.7 mg/dL — AB (ref 8.9–10.3)
CO2: 17 mmol/L — ABNORMAL LOW (ref 22–32)
CREATININE: 5.59 mg/dL — AB (ref 0.61–1.24)
Chloride: 95 mmol/L — ABNORMAL LOW (ref 101–111)
GFR calc non Af Amer: 10 mL/min — ABNORMAL LOW (ref >60)
GFR, EST AFRICAN AMERICAN: 12 mL/min — AB (ref >60)
GLUCOSE: 115 mg/dL — AB (ref 65–99)
Potassium: 2.5 mmol/L — CL (ref 3.5–5.1)
Sodium: 128 mmol/L — ABNORMAL LOW (ref 135–145)
Total Bilirubin: 0.7 mg/dL (ref 0.3–1.2)
Total Protein: 6.2 g/dL — ABNORMAL LOW (ref 6.5–8.1)

## 2015-03-31 LAB — BASIC METABOLIC PANEL
ANION GAP: 14 (ref 5–15)
BUN: 66 mg/dL — ABNORMAL HIGH (ref 6–20)
CO2: 16 mmol/L — ABNORMAL LOW (ref 22–32)
Calcium: 7.2 mg/dL — ABNORMAL LOW (ref 8.9–10.3)
Chloride: 104 mmol/L (ref 101–111)
Creatinine, Ser: 5.03 mg/dL — ABNORMAL HIGH (ref 0.61–1.24)
GFR calc Af Amer: 14 mL/min — ABNORMAL LOW (ref >60)
GFR, EST NON AFRICAN AMERICAN: 12 mL/min — AB (ref >60)
Glucose, Bld: 106 mg/dL — ABNORMAL HIGH (ref 65–99)
POTASSIUM: 2.8 mmol/L — AB (ref 3.5–5.1)
SODIUM: 134 mmol/L — AB (ref 135–145)

## 2015-03-31 LAB — MRSA PCR SCREENING: MRSA BY PCR: NEGATIVE

## 2015-03-31 LAB — TSH: TSH: 0.519 u[IU]/mL (ref 0.350–4.500)

## 2015-03-31 LAB — CBC WITH DIFFERENTIAL/PLATELET
Basophils Absolute: 0 10*3/uL (ref 0.0–0.1)
Basophils Relative: 0 % (ref 0–1)
EOS PCT: 0 % (ref 0–5)
Eosinophils Absolute: 0 10*3/uL (ref 0.0–0.7)
HEMATOCRIT: 31.1 % — AB (ref 39.0–52.0)
HEMOGLOBIN: 11.1 g/dL — AB (ref 13.0–17.0)
LYMPHS ABS: 1.7 10*3/uL (ref 0.7–4.0)
Lymphocytes Relative: 6 % — ABNORMAL LOW (ref 12–46)
MCH: 27.6 pg (ref 26.0–34.0)
MCHC: 35.7 g/dL (ref 30.0–36.0)
MCV: 77.4 fL — ABNORMAL LOW (ref 78.0–100.0)
MONO ABS: 1.4 10*3/uL — AB (ref 0.1–1.0)
Monocytes Relative: 5 % (ref 3–12)
NEUTROS ABS: 24.4 10*3/uL — AB (ref 1.7–7.7)
Neutrophils Relative %: 89 % — ABNORMAL HIGH (ref 43–77)
Platelets: 400 10*3/uL (ref 150–400)
RBC: 4.02 MIL/uL — AB (ref 4.22–5.81)
RDW: 15.2 % (ref 11.5–15.5)
WBC: 27.5 10*3/uL — AB (ref 4.0–10.5)

## 2015-03-31 LAB — BLOOD GAS, ARTERIAL
Acid-base deficit: 8.7 mmol/L — ABNORMAL HIGH (ref 0.0–2.0)
BICARBONATE: 15.1 meq/L — AB (ref 20.0–24.0)
Drawn by: 331471
O2 SAT: 92.7 %
PO2 ART: 72.3 mmHg — AB (ref 80.0–100.0)
Patient temperature: 98.6
TCO2: 14.4 mmol/L (ref 0–100)
pCO2 arterial: 26.6 mmHg — ABNORMAL LOW (ref 35.0–45.0)
pH, Arterial: 7.374 (ref 7.350–7.450)

## 2015-03-31 LAB — I-STAT CHEM 8, ED
BUN: 63 mg/dL — ABNORMAL HIGH (ref 6–20)
CALCIUM ION: 0.95 mmol/L — AB (ref 1.12–1.23)
CHLORIDE: 95 mmol/L — AB (ref 101–111)
Creatinine, Ser: 5.4 mg/dL — ABNORMAL HIGH (ref 0.61–1.24)
GLUCOSE: 118 mg/dL — AB (ref 65–99)
HCT: 36 % — ABNORMAL LOW (ref 39.0–52.0)
HEMOGLOBIN: 12.2 g/dL — AB (ref 13.0–17.0)
Potassium: 2.3 mmol/L — CL (ref 3.5–5.1)
SODIUM: 127 mmol/L — AB (ref 135–145)
TCO2: 15 mmol/L (ref 0–100)

## 2015-03-31 LAB — LACTIC ACID, PLASMA: Lactic Acid, Venous: 1 mmol/L (ref 0.5–2.0)

## 2015-03-31 LAB — URINALYSIS, ROUTINE W REFLEX MICROSCOPIC
Bilirubin Urine: NEGATIVE
Glucose, UA: NEGATIVE mg/dL
Ketones, ur: NEGATIVE mg/dL
NITRITE: NEGATIVE
PROTEIN: 100 mg/dL — AB
SPECIFIC GRAVITY, URINE: 1.012 (ref 1.005–1.030)
UROBILINOGEN UA: 0.2 mg/dL (ref 0.0–1.0)
pH: 5.5 (ref 5.0–8.0)

## 2015-03-31 LAB — I-STAT TROPONIN, ED: TROPONIN I, POC: 0.02 ng/mL (ref 0.00–0.08)

## 2015-03-31 LAB — CK: Total CK: 512 U/L — ABNORMAL HIGH (ref 49–397)

## 2015-03-31 LAB — MAGNESIUM: Magnesium: 2.3 mg/dL (ref 1.7–2.4)

## 2015-03-31 LAB — I-STAT CG4 LACTIC ACID, ED: LACTIC ACID, VENOUS: 0.94 mmol/L (ref 0.5–2.0)

## 2015-03-31 LAB — ACETAMINOPHEN LEVEL: Acetaminophen (Tylenol), Serum: <10 ug/mL — ABNORMAL LOW (ref 10–30)

## 2015-03-31 LAB — URINE MICROSCOPIC-ADD ON

## 2015-03-31 LAB — VITAMIN B12: VITAMIN B 12: 393 pg/mL (ref 180–914)

## 2015-03-31 LAB — AMMONIA: Ammonia: 19 umol/L (ref 9–35)

## 2015-03-31 LAB — SALICYLATE LEVEL: Salicylate Lvl: <4 mg/dL (ref 2.8–30.0)

## 2015-03-31 MED ORDER — CEFTRIAXONE SODIUM 1 G IJ SOLR
1.0000 g | Freq: Once | INTRAMUSCULAR | Status: AC
  Administered 2015-03-31: 1 g via INTRAVENOUS
  Filled 2015-03-31: qty 10

## 2015-03-31 MED ORDER — VANCOMYCIN HCL IN DEXTROSE 1-5 GM/200ML-% IV SOLN
1000.0000 mg | Freq: Once | INTRAVENOUS | Status: AC
  Administered 2015-03-31: 1000 mg via INTRAVENOUS
  Filled 2015-03-31: qty 200

## 2015-03-31 MED ORDER — SODIUM CHLORIDE 0.9 % IV BOLUS (SEPSIS)
2000.0000 mL | Freq: Once | INTRAVENOUS | Status: AC
  Administered 2015-03-31: 2000 mL via INTRAVENOUS

## 2015-03-31 MED ORDER — FOLIC ACID 5 MG/ML IJ SOLN
1.0000 mg | Freq: Every day | INTRAMUSCULAR | Status: DC
  Administered 2015-03-31 – 2015-04-04 (×5): 1 mg via INTRAVENOUS
  Filled 2015-03-31 (×7): qty 0.2

## 2015-03-31 MED ORDER — PIPERACILLIN-TAZOBACTAM 3.375 G IVPB
3.3750 g | Freq: Once | INTRAVENOUS | Status: AC
Start: 2015-03-31 — End: 2015-03-31
  Administered 2015-03-31: 3.375 g via INTRAVENOUS
  Filled 2015-03-31: qty 50

## 2015-03-31 MED ORDER — SODIUM CHLORIDE 0.9 % IJ SOLN
3.0000 mL | Freq: Two times a day (BID) | INTRAMUSCULAR | Status: DC
  Administered 2015-03-31 – 2015-04-06 (×5): 3 mL via INTRAVENOUS

## 2015-03-31 MED ORDER — HEPARIN SODIUM (PORCINE) 5000 UNIT/ML IJ SOLN
5000.0000 [IU] | Freq: Three times a day (TID) | INTRAMUSCULAR | Status: DC
  Administered 2015-03-31 – 2015-04-07 (×21): 5000 [IU] via SUBCUTANEOUS
  Filled 2015-03-31 (×21): qty 1

## 2015-03-31 MED ORDER — ONDANSETRON HCL 4 MG PO TABS: 4.0000 mg | ORAL_TABLET | Freq: Four times a day (QID) | ORAL | Status: DC | PRN

## 2015-03-31 MED ORDER — ACETAMINOPHEN 650 MG RE SUPP: 650.0000 mg | Freq: Four times a day (QID) | RECTAL | Status: DC | PRN

## 2015-03-31 MED ORDER — PIPERACILLIN-TAZOBACTAM IN DEX 2-0.25 GM/50ML IV SOLN
2.2500 g | Freq: Three times a day (TID) | INTRAVENOUS | Status: DC
  Administered 2015-04-01 – 2015-04-03 (×8): 2.25 g via INTRAVENOUS
  Filled 2015-03-31 (×9): qty 50

## 2015-03-31 MED ORDER — POTASSIUM CHLORIDE 10 MEQ/100ML IV SOLN
10.0000 meq | INTRAVENOUS | Status: AC
  Administered 2015-03-31: 10 meq via INTRAVENOUS
  Filled 2015-03-31: qty 100

## 2015-03-31 MED ORDER — SODIUM CHLORIDE 0.9 % IV SOLN
INTRAVENOUS | Status: DC
Start: 2015-03-31 — End: 2015-04-04
  Administered 2015-03-31 – 2015-04-04 (×5): via INTRAVENOUS

## 2015-03-31 MED ORDER — ONDANSETRON HCL 4 MG/2ML IJ SOLN: 4.0000 mg | Freq: Four times a day (QID) | INTRAMUSCULAR | Status: DC | PRN

## 2015-03-31 MED ORDER — VANCOMYCIN HCL IN DEXTROSE 1-5 GM/200ML-% IV SOLN: 1000.0000 mg | INTRAVENOUS | Status: DC

## 2015-03-31 MED ORDER — ACETAMINOPHEN 325 MG PO TABS
650.0000 mg | ORAL_TABLET | Freq: Four times a day (QID) | ORAL | Status: DC | PRN
  Filled 2015-03-31: qty 2

## 2015-03-31 MED ORDER — THIAMINE HCL 100 MG/ML IJ SOLN
100.0000 mg | Freq: Every day | INTRAMUSCULAR | Status: DC
  Administered 2015-03-31 – 2015-04-04 (×5): 100 mg via INTRAVENOUS
  Filled 2015-03-31 (×6): qty 2

## 2015-03-31 MED ORDER — POTASSIUM CHLORIDE 10 MEQ/100ML IV SOLN
10.0000 meq | Freq: Once | INTRAVENOUS | Status: AC
  Administered 2015-03-31: 10 meq via INTRAVENOUS
  Filled 2015-03-31: qty 100

## 2015-03-31 NOTE — ED Notes (Addendum)
Missed 2 mental health appoint mobile crisis called ems, pt is confused with fecal matter on him, hypotensive 88/50 after 500cc bolus. Lives at home with self. Iv started, cbg 148, weakness. Has an indwelling cath in place from prostate issues and bladder tumor.

## 2015-03-31 NOTE — Progress Notes (Signed)
Pt's renal ultrasound showed that chronic urinary catheter was malpositioned. Results called to NP on call. NP on call gave orders for this RN to attempt to advance urinary catheter by one inch however if resistance met to not force advancement. It was noted with balloon deflation that 14-15cc of saline was removed despite device indication that balloon was a 10cc size. Catheter advanced with no resistance and immediate increase of urinary output noted via catheter. Balloon inflated with 10cc of saline however urinary catheter regressed several inches when released to gravity. Collection bag was changed at this time and an anchoring device (stat lock) placed. The above events were related to NP on call and this RN was advised to monitor the urinary output and notified if amount is insufficient. Will continue to monitor

## 2015-03-31 NOTE — Progress Notes (Signed)
ANTIBIOTIC CONSULT NOTE - INITIAL  Pharmacy Consult for vancomycin, Zosyn Indication: rule out sepsis  No Known Allergies  Patient Measurements: Weight: 176 lb (79.833 kg)   Vital Signs: Temp: 99.5 F (37.5 C) (09/01 1440) Temp Source: Oral (09/01 1440) BP: 96/59 mmHg (09/01 1615) Pulse Rate: 87 (09/01 1615) Intake/Output from previous day:   Intake/Output from this shift: Total I/O In: 2250 [I.V.:2250] Out: 400 [Urine:400]  Labs:  Recent Labs  03/31/15 1523 03/31/15 1532  WBC 27.5*  --   HGB 11.1* 12.2*  PLT 400  --   CREATININE 5.59* 5.40*   Estimated Creatinine Clearance: 16.3 mL/min (by C-G formula based on Cr of 5.4). No results for input(s): VANCOTROUGH, VANCOPEAK, VANCORANDOM, GENTTROUGH, GENTPEAK, GENTRANDOM, TOBRATROUGH, TOBRAPEAK, TOBRARND, AMIKACINPEAK, AMIKACINTROU, AMIKACIN in the last 72 hours.   Microbiology: No results found for this or any previous visit (from the past 720 hour(s)).  Medical History: Past Medical History  Diagnosis Date  . Paranoid schizophrenia   . Fall at home     x1, 12/2014  . Anxiety   . Depression   . GERD (gastroesophageal reflux disease)     under control  . Foley catheter in place     Medications:  Scheduled:   Infusions:  . potassium chloride    . potassium chloride 10 mEq (03/31/15 1548)  . potassium chloride     Assessment: 59 yo presented to ER with CC hypotension, AMS and for medical clearance. PMH includes schizophrenia. Patient also had indwelling foley catheter at presentation. Vanc and Zosyn ordered x 1 by ER Md for likely urosepsis. To continue both vancomycin and Zosyn per pharmacy dosing.  Baseline labs include elevated SCr of 5.4 due to dehydration and est CrCl of 16 ml/min, elevated WBC of 27.5 and afebrile   Goal of Therapy:  Vancomycin trough level 15-20 mcg/ml  Plan:  1) Vanc 1g x 1 in ER. Start vancomycin 1g IV q48 thereafter 2) Zosyn 3.375g x 1 in ER. Start Zosyn 2.25g IV q8  thereafter   Adrian Saran, PharmD, BCPS Pager [phone] 03/31/2015 4:48 PM

## 2015-03-31 NOTE — ED Provider Notes (Signed)
CSN: [id number]     Arrival date & time 03/31/15  1432 History   First MD Initiated Contact with Patient 03/31/15 1455     Chief Complaint  Patient presents with  . Hypotension  . Medical Clearance     (Consider location/radiation/quality/duration/timing/severity/associated sxs/prior Treatment) Patient is a 59 y.o. male presenting with altered mental status. The history is provided by the patient (The patient has a history of schizophrenia. He missed his last 2 psychiatric appointment. So someone with an outpatient his house to check on him. He was confused and lying in feces he has a Foley catheter in place. Patient had a recent biopsy on bladder ).  Altered Mental Status Presenting symptoms: confusion   Severity:  Moderate Most recent episode:  Today Duration: unknown. Timing:  Constant Progression:  Unchanged Chronicity:  New Context: not alcohol use and not dementia     Past Medical History  Diagnosis Date  . Paranoid schizophrenia   . Fall at home     x1, 12/2014  . Anxiety   . Depression   . GERD (gastroesophageal reflux disease)     under control  . Foley catheter in place    Past Surgical History  Procedure Laterality Date  . Neck surgery    . Cystoscopy N/A 03/08/2015    Procedure: CYSTOSCOPY;  Surgeon: Lowella Bandy, MD;  Location: [facility];  Service: Urology;  Laterality: N/A;  . Transurethral resection of bladder tumor N/A 03/08/2015    Procedure: TRANSURETHRAL RESECTION OF BLADDER TUMOR (TURBT);  Surgeon: Lowella Bandy, MD;  Location: [facility];  Service: Urology;  Laterality: N/A;  TUR BIOPSY BLADDER  . Prostate biopsy N/A 03/08/2015    Procedure: BIOPSY TRANSRECTAL ULTRASONIC PROSTATE (TUBP);  Surgeon: Lowella Bandy, MD;  Location: [facility];  Service: Urology;  Laterality: N/A;   History reviewed. No pertinent family history. Social History  Substance Use Topics  . Smoking status: Current Every Day Smoker -- 1.00 packs/day for 40 years    Types: Cigarettes  . Smokeless tobacco:  Never Used  . Alcohol Use: No    Review of Systems  Unable to perform ROS: Mental status change  Psychiatric/Behavioral: Positive for confusion.      Allergies  Review of patient's allergies indicates no known allergies.  Home Medications   Prior to Admission medications   Medication Sig Start Date End Date Taking? Authorizing Provider  acetaminophen (TYLENOL) 500 MG tablet Take 1,000 mg by mouth every 4 (four) hours as needed for moderate pain or headache.    Historical Provider, MD  benztropine (COGENTIN) 0.5 MG tablet Take 0.5 mg by mouth every morning. 12/13/14   Historical Provider, MD  ciprofloxacin (CIPRO) 500 MG tablet Take 1 tablet (500 mg total) by mouth 2 (two) times daily. 01/20/15   Florencia Reasons, MD  clozapine (CLOZARIL) 200 MG tablet Take 800 mg by mouth at bedtime. 12/10/14   Historical Provider, MD  lactobacillus (FLORANEX/LACTINEX) PACK Take 1 packet (1 g total) by mouth 3 (three) times daily with meals. Patient not taking: Reported on 03/07/2015 01/20/15   Florencia Reasons, MD  simvastatin (ZOCOR) 20 MG tablet Take 20 mg by mouth daily. 10/10/14   Historical Provider, MD  tamsulosin (FLOMAX) 0.4 MG CAPS capsule Take 1 capsule (0.4 mg total) by mouth daily after supper. 01/20/15   Florencia Reasons, MD   BP 100/53 mmHg  Pulse 85  Temp(Src) 99.5 F (37.5 C) (Oral)  Resp 27  Wt 176 lb (79.833 kg)  SpO2 96% Physical Exam  Constitutional: He appears well-developed.  HENT:  Head: Normocephalic.  Dry mucous membranes  Eyes: Conjunctivae and EOM are normal. No scleral icterus.  Neck: Neck supple. No thyromegaly present.  Cardiovascular: Normal rate and regular rhythm.  Exam reveals no gallop and no friction rub.   No murmur heard. Pulmonary/Chest: No stridor. He has no wheezes. He has no rales. He exhibits no tenderness.  Abdominal: He exhibits no distension. There is no tenderness. There is no rebound.  Musculoskeletal: Normal range of motion. He exhibits no edema.  Lymphadenopathy:    He  has no cervical adenopathy.  Neurological: He exhibits normal muscle tone. Coordination normal.  Pt lethargic,  Can be arroused and willl answer some questions appropriately  Skin: No rash noted. No erythema.    ED Course  Procedures (including critical care time) Labs Review Labs Reviewed  CBC WITH DIFFERENTIAL/PLATELET - Abnormal; Notable for the following:    WBC 27.5 (*)    RBC 4.02 (*)    Hemoglobin 11.1 (*)    HCT 31.1 (*)    MCV 77.4 (*)    All other components within normal limits  I-STAT CHEM 8, ED - Abnormal; Notable for the following:    Sodium 127 (*)    Potassium 2.3 (*)    Chloride 95 (*)    BUN 63 (*)    Creatinine, Ser 5.40 (*)    Glucose, Bld 118 (*)    Calcium, Ion 0.95 (*)    Hemoglobin 12.2 (*)    HCT 36.0 (*)    All other components within normal limits  URINE CULTURE  CULTURE, BLOOD (ROUTINE X 2)  CULTURE, BLOOD (ROUTINE X 2)  COMPREHENSIVE METABOLIC PANEL  URINALYSIS, ROUTINE W REFLEX MICROSCOPIC (NOT AT [facility])  BLOOD GAS, ARTERIAL  I-STAT CG4 LACTIC ACID, ED  I-STAT TROPOININ, ED  I-STAT CG4 LACTIC ACID, ED  I-STAT CHEM 8, ED    Imaging Review Dg Chest Port 1 View  03/31/2015   CLINICAL DATA:  Weakness with altered mental status  EXAM: PORTABLE CHEST - 1 VIEW  COMPARISON:  January 13, 2015  FINDINGS: There is no edema or consolidation. The heart size and pulmonary vascularity normal. No adenopathy. There is postoperative change in the lower cervical spine.  IMPRESSION: No edema or consolidation.   Electronically Signed   By: Lowella Grip III M.D.   On: 03/31/2015 15:46   I have personally reviewed and evaluated these images and lab results as part of my medical decision-making.   EKG Interpretation   Date/Time:  Thursday March 31 2015 14:47:56 EDT Ventricular Rate:  93 PR Interval:  161 QRS Duration: 101 QT Interval:  424 QTC Calculation: 527 R Axis:   46 Text Interpretation:  Sinus rhythm Prolonged QT interval Confirmed by  Isahi Godwin   MD, Ariv Penrod [phone]) on 03/31/2015 3:14:10 PM     CRITICAL CARE Performed by: Elyanah Farino L Total critical care time: 45 Critical care time was exclusive of separately billable procedures and treating other patients. Critical care was necessary to treat or prevent imminent or life-threatening deterioration. Critical care was time spent personally by me on the following activities: development of treatment plan with patient and/or surrogate as well as nursing, discussions with consultants, evaluation of patient's response to treatment, examination of patient, obtaining history from patient or surrogate, ordering and performing treatments and interventions, ordering and review of laboratory studies, ordering and review of radiographic studies, pulse oximetry and re-evaluation of patient's condition.  MDM  Patient is septic most likely  from urinary tract infection he has a white count of 27,000. He is also hypokalemic patient is dehydrated and in renal failure with BUN of 63 creatinine of 5.4. Patient has had blood and urine cultures done bolused with 30 mL/kg of normal saline. And vancomycin and Zosyn have been started on the patient. He will be admitted to [facility] to stepdown. Final diagnoses:  Sepsis associated hypotension        Milton Ferguson, MD 03/31/15 1610

## 2015-03-31 NOTE — ED Notes (Signed)
Pt arrived cover in stool on legs, follow commands but does not answer questions. Pt cleaned dr zammit to bedside.

## 2015-03-31 NOTE — ED Notes (Signed)
Pt was seen by urology on 8/9, note states bladder tumor and elevated PSA, foley was placed due to urinary retention.

## 2015-03-31 NOTE — Progress Notes (Signed)
Pt has received a total of 3 runs of potassium at this time. Called NP on call for clarification as to the total number of runs that pt is to receive. Per order we will hold any other potassium runs until the 2nd BMET results. Will continue to monitor

## 2015-03-31 NOTE — H&P (Addendum)
[facility] History and Physical  Edwin [name] KGM:[id number] DOB: June 09, 1956 DOA: 03/31/2015  Referring physician: ED physician.  PCP: [facility]   Chief Complaint: AMS  HPI: Edwin [name] is a 59 y.o. male PMH of paranoid schizophrenia, Prostate cancer, foley catheter, patient was found at home with AMS. History was obtain from chart and ED physician. Patient unable to provide history. Patient missed his last 2 psychiatric appointment. Mobile crisis call EMS, patient was found with fecal matter on him, with AMS, hypotensive SBP in the 80. Patient received IV bolus.  Evaluation in the ED, patient is lethargic, Cr at 5.5, BUN 63, K level at 2.3. UA with too numerous to count WBC. Chest x ray: no edema or consolidation.    Review of Systems:  Unable to obtain due to AMS.   Past Medical History  Diagnosis Date  . Paranoid schizophrenia   . Fall at home     x1, 12/2014  . Anxiety   . Depression   . GERD (gastroesophageal reflux disease)     under control  . Foley catheter in place    Past Surgical History  Procedure Laterality Date  . Neck surgery    . Cystoscopy N/A 03/08/2015    Procedure: CYSTOSCOPY;  Surgeon: Lowella Bandy, MD;  Location: [facility];  Service: Urology;  Laterality: N/A;  . Transurethral resection of bladder tumor N/A 03/08/2015    Procedure: TRANSURETHRAL RESECTION OF BLADDER TUMOR (TURBT);  Surgeon: Lowella Bandy, MD;  Location: [facility];  Service: Urology;  Laterality: N/A;  TUR BIOPSY BLADDER  . Prostate biopsy N/A 03/08/2015    Procedure: BIOPSY TRANSRECTAL ULTRASONIC PROSTATE (TUBP);  Surgeon: Lowella Bandy, MD;  Location: [facility];  Service: Urology;  Laterality: N/A;   Social History:  reports that he has been smoking Cigarettes.  He has a 40 pack-year smoking history. He has never used smokeless tobacco. He reports that he does not drink alcohol or use illicit drugs.  No Known Allergies Family history; unable to obtain due to AMS>   Prior to Admission  medications   Medication Sig Start Date End Date Taking? Authorizing Provider  acetaminophen (TYLENOL) 500 MG tablet Take 1,000 mg by mouth every 4 (four) hours as needed for moderate pain or headache.    Historical Provider, MD  benztropine (COGENTIN) 0.5 MG tablet Take 0.5 mg by mouth every morning. 12/13/14   Historical Provider, MD  ciprofloxacin (CIPRO) 500 MG tablet Take 1 tablet (500 mg total) by mouth 2 (two) times daily. 01/20/15   Florencia Reasons, MD  clozapine (CLOZARIL) 200 MG tablet Take 800 mg by mouth at bedtime. 12/10/14   Historical Provider, MD  lactobacillus (FLORANEX/LACTINEX) PACK Take 1 packet (1 g total) by mouth 3 (three) times daily with meals. Patient not taking: Reported on 03/07/2015 01/20/15   Florencia Reasons, MD  simvastatin (ZOCOR) 20 MG tablet Take 20 mg by mouth daily. 10/10/14   Historical Provider, MD  tamsulosin (FLOMAX) 0.4 MG CAPS capsule Take 1 capsule (0.4 mg total) by mouth daily after supper. 01/20/15   Florencia Reasons, MD   Physical Exam: Filed Vitals:   03/31/15 1530 03/31/15 1545 03/31/15 1600 03/31/15 1615  BP: 103/57 100/53 101/57 96/59  Pulse: 88 85 86 87  Temp:      TempSrc:      Resp: 26 27 26 27   Weight:      SpO2: 98% 96% 95% 97%    Wt Readings from Last 3 Encounters:  03/31/15 79.833  kg (176 lb)  03/08/15 79.833 kg (176 lb)  03/07/15 79.833 kg (176 lb)    General:  Lethargic, respond to painful stimuli. Moves right upper extremity passively. He was coughing while I was in the room.  Eyes: PERRL, normal lids, irises & conjunctiva ENT: grossly normal hearing, lips & tongue dry Neck: no LAD, masses or thyromegaly Cardiovascular: RRR, no m/r/g. No LE edema. Telemetry: SR, no arrhythmias  Respiratory: CTA bilaterally, no w/r/r. Normal respiratory effort.  Abdomen: soft, ntnd Skin: no rash or induration seen on limited exam Musculoskeletal: grossly normal tone BUE/BLE Psychiatric: unable to assess Neurologic: limited due to AMS. Lethargic, moves right upper  extremity passively, no responsive. Cough at times.           Labs on Admission:  Basic Metabolic Panel:  Recent Labs Lab 03/31/15 1523 03/31/15 1532  NA 128* 127*  K 2.5* 2.3*  CL 95* 95*  CO2 17*  --   GLUCOSE 115* 118*  BUN 77* 63*  CREATININE 5.59* 5.40*  CALCIUM 7.7*  --    Liver Function Tests:  Recent Labs Lab 03/31/15 1523  AST 60*  ALT 48  ALKPHOS 116  BILITOT 0.7  PROT 6.2*  ALBUMIN 2.4*   No results for input(s): LIPASE, AMYLASE in the last 168 hours. No results for input(s): AMMONIA in the last 168 hours. CBC:  Recent Labs Lab 03/31/15 1523 03/31/15 1532  WBC 27.5*  --   NEUTROABS 24.4*  --   HGB 11.1* 12.2*  HCT 31.1* 36.0*  MCV 77.4*  --   PLT 400  --    Cardiac Enzymes: No results for input(s): CKTOTAL, CKMB, CKMBINDEX, TROPONINI in the last 168 hours.  BNP (last 3 results) No results for input(s): BNP in the last 8760 hours.  ProBNP (last 3 results) No results for input(s): PROBNP in the last 8760 hours.  CBG: No results for input(s): GLUCAP in the last 168 hours.  Radiological Exams on Admission: Dg Chest Port 1 View  03/31/2015   CLINICAL DATA:  Weakness with altered mental status  EXAM: PORTABLE CHEST - 1 VIEW  COMPARISON:  January 13, 2015  FINDINGS: There is no edema or consolidation. The heart size and pulmonary vascularity normal. No adenopathy. There is postoperative change in the lower cervical spine.  IMPRESSION: No edema or consolidation.   Electronically Signed   By: Lowella Grip III M.D.   On: 03/31/2015 15:46    EKG: Independently reviewed. Sinus , prolong QT.   Assessment/Plan Active Problems:   Sepsis  1-Acute encephalopathy;  Unclear etiology, could be multifactorial: infectious, medications, uremia. Electrolytes abnormalities.  Will check CT head, tylenol level, salicylic level, ABG, TSH.  Treating for infections. Frequent neuro check.  Admit to step down unit.   2-Acute on chronic renal failure.  Suspect  related to hypovolemia, hypotension. Foley draining.  Will order renal US.  IV fluids.  Check CK level.   3-Hypokalemia: IV kcl 3 runs order. Repeat Bmet tonight. Check Mg level.   4-Sepsis: source probably UTI./ IV fluids, IV antibiotics. Vancomycin and zosyn.  Presents with hypotension, leukocytosis, tachycardia.   5-Metabolic acidosis; will consider IV bicarb Gtt when hypokalemia is corrected.  6-Hyponatremia; suspect related to hypovolemia; start IV fluids.    Code Status: full code.  DVT Prophylaxis: Lovenox.  Family Communication: none at bedside.  Disposition Plan: expect 2 to 3 days inpatient.   Time spent: 75 minutes.   Niel Hummer A [facility] Pager [phone]

## 2015-03-31 NOTE — ED Notes (Signed)
Bed: WA08 Expected date:  Expected time:  Means of arrival:  Comments: Ems- psych issue, colostomy broke, rectal pain?

## 2015-03-31 NOTE — ED Notes (Signed)
PT NOT IN ROOM

## 2015-03-31 NOTE — ED Notes (Signed)
Patient transported to CT 

## 2015-04-01 LAB — URINE CULTURE: CULTURE: NO GROWTH

## 2015-04-01 LAB — COMPREHENSIVE METABOLIC PANEL
ALBUMIN: 2 g/dL — AB (ref 3.5–5.0)
ALK PHOS: 105 U/L (ref 38–126)
ALT: 53 U/L (ref 17–63)
AST: 75 U/L — ABNORMAL HIGH (ref 15–41)
Anion gap: 10 (ref 5–15)
BUN: 64 mg/dL — ABNORMAL HIGH (ref 6–20)
CALCIUM: 7.3 mg/dL — AB (ref 8.9–10.3)
CHLORIDE: 107 mmol/L (ref 101–111)
CO2: 18 mmol/L — AB (ref 22–32)
CREATININE: 4.74 mg/dL — AB (ref 0.61–1.24)
GFR calc Af Amer: 15 mL/min — ABNORMAL LOW (ref >60)
GFR calc non Af Amer: 13 mL/min — ABNORMAL LOW (ref >60)
GLUCOSE: 104 mg/dL — AB (ref 65–99)
Potassium: 3.1 mmol/L — ABNORMAL LOW (ref 3.5–5.1)
SODIUM: 135 mmol/L (ref 135–145)
Total Bilirubin: 0.4 mg/dL (ref 0.3–1.2)
Total Protein: 5.4 g/dL — ABNORMAL LOW (ref 6.5–8.1)

## 2015-04-01 LAB — CBC
HCT: 32.6 % — ABNORMAL LOW (ref 39.0–52.0)
HEMOGLOBIN: 11.2 g/dL — AB (ref 13.0–17.0)
MCH: 26.7 pg (ref 26.0–34.0)
MCHC: 34.4 g/dL (ref 30.0–36.0)
MCV: 77.8 fL — AB (ref 78.0–100.0)
Platelets: 447 10*3/uL — ABNORMAL HIGH (ref 150–400)
RBC: 4.19 MIL/uL — ABNORMAL LOW (ref 4.22–5.81)
RDW: 15.6 % — ABNORMAL HIGH (ref 11.5–15.5)
WBC: 27.3 10*3/uL — ABNORMAL HIGH (ref 4.0–10.5)

## 2015-04-01 MED ORDER — TAMSULOSIN HCL 0.4 MG PO CAPS
0.4000 mg | ORAL_CAPSULE | Freq: Every day | ORAL | Status: DC
Start: 2015-04-01 — End: 2015-04-07
  Administered 2015-04-01 – 2015-04-06 (×6): 0.4 mg via ORAL
  Filled 2015-04-01 (×6): qty 1

## 2015-04-01 MED ORDER — CLOTRIMAZOLE 1 % EX CREA
TOPICAL_CREAM | Freq: Two times a day (BID) | CUTANEOUS | Status: DC
  Administered 2015-04-01 – 2015-04-05 (×8): via TOPICAL
  Administered 2015-04-05: 1 via TOPICAL
  Administered 2015-04-06 – 2015-04-07 (×3): via TOPICAL
  Filled 2015-04-01 (×2): qty 15

## 2015-04-01 MED ORDER — CLOZAPINE 100 MG PO TABS
800.0000 mg | ORAL_TABLET | Freq: Every day | ORAL | Status: DC
  Administered 2015-04-02 – 2015-04-06 (×6): 800 mg via ORAL
  Filled 2015-04-01 (×7): qty 8

## 2015-04-01 MED ORDER — CALCIUM CARBONATE 1250 (500 CA) MG PO TABS
1.0000 | ORAL_TABLET | Freq: Two times a day (BID) | ORAL | Status: DC
  Administered 2015-04-01 – 2015-04-05 (×9): 500 mg via ORAL
  Filled 2015-04-01 (×9): qty 1

## 2015-04-01 MED ORDER — BENZTROPINE MESYLATE 0.5 MG PO TABS
0.5000 mg | ORAL_TABLET | Freq: Every morning | ORAL | Status: DC
  Administered 2015-04-01 – 2015-04-02 (×2): 0.5 mg via ORAL
  Filled 2015-04-01 (×2): qty 1

## 2015-04-01 MED ORDER — POTASSIUM CHLORIDE 10 MEQ/100ML IV SOLN
10.0000 meq | INTRAVENOUS | Status: AC
  Administered 2015-04-01 (×3): 10 meq via INTRAVENOUS
  Filled 2015-04-01: qty 100

## 2015-04-01 MED ORDER — POTASSIUM CHLORIDE CRYS ER 20 MEQ PO TBCR
40.0000 meq | EXTENDED_RELEASE_TABLET | Freq: Once | ORAL | Status: AC
  Administered 2015-04-01: 40 meq via ORAL
  Filled 2015-04-01: qty 2

## 2015-04-01 NOTE — Consult Note (Signed)
Urology Consult  Referring physician: Renetta Chalk Reason for referral: Foley not draining?  Chief Complaint: Foley may not be in bladder  History of Present Illness: Patient with prostate cancer known to Dr Janice Norrie; ongoing urine retention; considering active therapy for retention and prostate cancer; failed trials of voiding; in ICU admitted altered lever of consciousness; schizo; elevated Cr; acute encephalo; ? Sepsis; known to have bilateral hydro with Dr Janice Norrie; Cr 5.4 and now 4.74 (Cr was 1.63 January 20 2015); WBC elevated; u/sound showed severe bilateral hydro June 19th- no foley at the time- 760 cc in bladder;  U/sound Sept 1: bilateral hydro; foley likely in prostatic urethra Urine and blood c/s normal so far Modifying factors: There are no other modifying factors  Associated signs and symptoms: There are no other associated signs and symptoms Aggravating and relieving factors: There are no other aggravating or relieving factors Severity: Moderate Duration: Persistent Patient having no pain or blood and catheter is draining great clear urine  Past Medical History  Diagnosis Date  . Paranoid schizophrenia   . Fall at home     x1, 12/2014  . Anxiety   . Depression   . GERD (gastroesophageal reflux disease)     under control  . Foley catheter in place    Past Surgical History  Procedure Laterality Date  . Neck surgery    . Cystoscopy N/A 03/08/2015    Procedure: CYSTOSCOPY;  Surgeon: Lowella Bandy, MD;  Location: [facility];  Service: Urology;  Laterality: N/A;  . Transurethral resection of bladder tumor N/A 03/08/2015    Procedure: TRANSURETHRAL RESECTION OF BLADDER TUMOR (TURBT);  Surgeon: Lowella Bandy, MD;  Location: [facility];  Service: Urology;  Laterality: N/A;  TUR BIOPSY BLADDER  . Prostate biopsy N/A 03/08/2015    Procedure: BIOPSY TRANSRECTAL ULTRASONIC PROSTATE (TUBP);  Surgeon: Lowella Bandy, MD;  Location: [facility];  Service: Urology;  Laterality: N/A;    Medications: I have reviewed the  patient's current medications. Allergies: No Known Allergies  History reviewed. No pertinent family history. Social History:  reports that he has been smoking Cigarettes.  He has a 40 pack-year smoking history. He has never used smokeless tobacco. He reports that he does not drink alcohol or use illicit drugs.  ROS: All systems are reviewed and negative except as noted. Rest negative  Physical Exam:  Vital signs in last 24 hours: Temp:  [97.7 F (36.5 C)-98.5 F (36.9 C)] 98.5 F (36.9 C) (09/02 1200) Pulse Rate:  [84-102] 98 (09/02 1000) Resp:  [20-32] 20 (09/02 1000) BP: (96-123)/(56-83) 99/58 mmHg (09/02 1000) SpO2:  [95 %-99 %] 96 % (09/02 1000) Weight:  [79.5 kg (175 lb 4.3 oz)-80 kg (176 lb 5.9 oz)] 79.5 kg (175 lb 4.3 oz) (09/02 0300)  Cardiovascular: Skin warm; not flushed Respiratory: Breaths quiet; no shortness of breath Abdomen: No masses Neurological: Normal sensation to touch Musculoskeletal: Normal motor function arms and legs Lymphatics: No inguinal adenopathy Skin: No rashes Genitourinary:foley may be out inch more than usual but drains well; could not palpate foley in urethra perineally   Laboratory Data:  Results for orders placed or performed during the hospital encounter of 03/31/15 (from the past 72 hour(s))  Comprehensive metabolic panel     Status: Abnormal   Collection Time: 03/31/15  3:23 PM  Result Value Ref Range   Sodium 128 (L) 135 - 145 mmol/L   Potassium 2.5 (LL) 3.5 - 5.1 mmol/L    Comment: CRITICAL RESULT CALLED TO, READ BACK BY  AND VERIFIED WITH: PEUP M RN AT 1606 ON 9.1.16 BY MENDOZA B     Chloride 95 (L) 101 - 111 mmol/L   CO2 17 (L) 22 - 32 mmol/L   Glucose, Bld 115 (H) 65 - 99 mg/dL   BUN 77 (H) 6 - 20 mg/dL   Creatinine, Ser 5.59 (H) 0.61 - 1.24 mg/dL   Calcium 7.7 (L) 8.9 - 10.3 mg/dL   Total Protein 6.2 (L) 6.5 - 8.1 g/dL   Albumin 2.4 (L) 3.5 - 5.0 g/dL   AST 60 (H) 15 - 41 U/L   ALT 48 17 - 63 U/L   Alkaline Phosphatase 116  38 - 126 U/L   Total Bilirubin 0.7 0.3 - 1.2 mg/dL   GFR calc non Af Amer 10 (L) >60 mL/min   GFR calc Af Amer 12 (L) >60 mL/min    Comment: (NOTE) The eGFR has been calculated using the CKD EPI equation. This calculation has not been validated in all clinical situations. eGFR's persistently <60 mL/min signify possible Chronic Kidney Disease.    Anion gap 16 (H) 5 - 15  CBC with Differential     Status: Abnormal   Collection Time: 03/31/15  3:23 PM  Result Value Ref Range   WBC 27.5 (H) 4.0 - 10.5 K/uL   RBC 4.02 (L) 4.22 - 5.81 MIL/uL   Hemoglobin 11.1 (L) 13.0 - 17.0 g/dL   HCT 31.1 (L) 39.0 - 52.0 %   MCV 77.4 (L) 78.0 - 100.0 fL   MCH 27.6 26.0 - 34.0 pg   MCHC 35.7 30.0 - 36.0 g/dL   RDW 15.2 11.5 - 15.5 %   Platelets 400 150 - 400 K/uL   Neutrophils Relative % 89 (H) 43 - 77 %   Lymphocytes Relative 6 (L) 12 - 46 %   Monocytes Relative 5 3 - 12 %   Eosinophils Relative 0 0 - 5 %   Basophils Relative 0 0 - 1 %   Neutro Abs 24.4 (H) 1.7 - 7.7 K/uL   Lymphs Abs 1.7 0.7 - 4.0 K/uL   Monocytes Absolute 1.4 (H) 0.1 - 1.0 K/uL   Eosinophils Absolute 0.0 0.0 - 0.7 K/uL   Basophils Absolute 0.0 0.0 - 0.1 K/uL   WBC Morphology VACUOLATED NEUTROPHILS   Magnesium     Status: None   Collection Time: 03/31/15  3:23 PM  Result Value Ref Range   Magnesium 2.3 1.7 - 2.4 mg/dL  CK     Status: Abnormal   Collection Time: 03/31/15  3:23 PM  Result Value Ref Range   Total CK 512 (H) 49 - 397 U/L  I-stat troponin, ED     Status: None   Collection Time: 03/31/15  3:30 PM  Result Value Ref Range   Troponin i, poc 0.02 0.00 - 0.08 ng/mL   Comment 3            Comment: Due to the release kinetics of cTnI, a negative result within the first hours of the onset of symptoms does not rule out myocardial infarction with certainty. If myocardial infarction is still suspected, repeat the test at appropriate intervals.   I-Stat CG4 Lactic Acid, ED (Not at [facility])     Status: None   Collection  Time: 03/31/15  3:32 PM  Result Value Ref Range   Lactic Acid, Venous 0.94 0.5 - 2.0 mmol/L  I-stat chem 8, ed     Status: Abnormal   Collection Time: 03/31/15  3:32 PM  Result Value Ref Range   Sodium 127 (L) 135 - 145 mmol/L   Potassium 2.3 (LL) 3.5 - 5.1 mmol/L   Chloride 95 (L) 101 - 111 mmol/L   BUN 63 (H) 6 - 20 mg/dL   Creatinine, Ser 5.40 (H) 0.61 - 1.24 mg/dL   Glucose, Bld 118 (H) 65 - 99 mg/dL   Calcium, Ion 0.95 (L) 1.12 - 1.23 mmol/L   TCO2 15 0 - 100 mmol/L   Hemoglobin 12.2 (L) 13.0 - 17.0 g/dL   HCT 36.0 (L) 39.0 - 52.0 %   Comment NOTIFIED PHYSICIAN   Urinalysis, Routine w reflex microscopic (not at [facility])     Status: Abnormal   Collection Time: 03/31/15  3:52 PM  Result Value Ref Range   Color, Urine YELLOW YELLOW   APPearance TURBID (A) CLEAR   Specific Gravity, Urine 1.012 1.005 - 1.030   pH 5.5 5.0 - 8.0   Glucose, UA NEGATIVE NEGATIVE mg/dL   Hgb urine dipstick LARGE (A) NEGATIVE   Bilirubin Urine NEGATIVE NEGATIVE   Ketones, ur NEGATIVE NEGATIVE mg/dL   Protein, ur 100 (A) NEGATIVE mg/dL   Urobilinogen, UA 0.2 0.0 - 1.0 mg/dL   Nitrite NEGATIVE NEGATIVE   Leukocytes, UA LARGE (A) NEGATIVE  Urine culture     Status: None (Preliminary result)   Collection Time: 03/31/15  3:52 PM  Result Value Ref Range   Specimen Description URINE, CATHETERIZED    Special Requests NONE    Culture      NO GROWTH < 12 HOURS Performed at [facility]    Report Status PENDING   Urine microscopic-add on     Status: Abnormal   Collection Time: 03/31/15  3:52 PM  Result Value Ref Range   WBC, UA TOO NUMEROUS TO COUNT <3 WBC/hpf   Bacteria, UA MANY (A) RARE  Blood gas, arterial     Status: Abnormal   Collection Time: 03/31/15  4:10 PM  Result Value Ref Range   O2 Content ROOM AIR L/min   pH, Arterial 7.374 7.350 - 7.450   pCO2 arterial 26.6 (L) 35.0 - 45.0 mmHg   pO2, Arterial 72.3 (L) 80.0 - 100.0 mmHg   Bicarbonate 15.1 (L) 20.0 - 24.0 mEq/L   TCO2 14.4 0 -  100 mmol/L   Acid-base deficit 8.7 (H) 0.0 - 2.0 mmol/L   O2 Saturation 92.7 %   Patient temperature 98.6    Collection site RIGHT RADIAL    Drawn by [id number]    Sample type ARTERIAL DRAW    Allens test (pass/fail) PASS PASS  MRSA PCR Screening     Status: None   Collection Time: 03/31/15  6:21 PM  Result Value Ref Range   MRSA by PCR NEGATIVE NEGATIVE    Comment:        The GeneXpert MRSA Assay (FDA approved for NASAL specimens only), is one component of a comprehensive MRSA colonization surveillance program. It is not intended to diagnose MRSA infection nor to guide or monitor treatment for MRSA infections.   Acetaminophen level     Status: Abnormal   Collection Time: 03/31/15  7:58 PM  Result Value Ref Range   Acetaminophen (Tylenol), Serum <10 (L) 10 - 30 ug/mL    Comment:        THERAPEUTIC CONCENTRATIONS VARY SIGNIFICANTLY. A RANGE OF 10-30 ug/mL MAY BE AN EFFECTIVE CONCENTRATION FOR MANY PATIENTS. HOWEVER, SOME ARE BEST TREATED AT CONCENTRATIONS OUTSIDE THIS RANGE. ACETAMINOPHEN CONCENTRATIONS >150 ug/mL AT 4 HOURS AFTER  INGESTION AND >50 ug/mL AT 12 HOURS AFTER INGESTION ARE OFTEN ASSOCIATED WITH TOXIC REACTIONS.   Ammonia     Status: None   Collection Time: 03/31/15  7:58 PM  Result Value Ref Range   Ammonia 19 9 - 35 umol/L  Salicylate level     Status: None   Collection Time: 03/31/15  7:58 PM  Result Value Ref Range   Salicylate Lvl <0.3 2.8 - 30.0 mg/dL  Vitamin B12     Status: None   Collection Time: 03/31/15  7:58 PM  Result Value Ref Range   Vitamin B-12 393 180 - 914 pg/mL    Comment: (NOTE) This assay is not validated for testing neonatal or myeloproliferative syndrome specimens for Vitamin B12 levels. Performed at [facility]   TSH     Status: None   Collection Time: 03/31/15  7:58 PM  Result Value Ref Range   TSH 0.519 0.350 - 4.500 uIU/mL  Lactic acid, plasma     Status: None   Collection Time: 03/31/15  7:58 PM  Result Value  Ref Range   Lactic Acid, Venous 1.0 0.5 - 2.0 mmol/L  Basic metabolic panel     Status: Abnormal   Collection Time: 03/31/15 10:30 PM  Result Value Ref Range   Sodium 134 (L) 135 - 145 mmol/L    Comment: DELTA CHECK NOTED REPEATED TO VERIFY    Potassium 2.8 (L) 3.5 - 5.1 mmol/L    Comment: DELTA CHECK NOTED REPEATED TO VERIFY NO VISIBLE HEMOLYSIS    Chloride 104 101 - 111 mmol/L   CO2 16 (L) 22 - 32 mmol/L   Glucose, Bld 106 (H) 65 - 99 mg/dL   BUN 66 (H) 6 - 20 mg/dL   Creatinine, Ser 5.03 (H) 0.61 - 1.24 mg/dL   Calcium 7.2 (L) 8.9 - 10.3 mg/dL   GFR calc non Af Amer 12 (L) >60 mL/min   GFR calc Af Amer 14 (L) >60 mL/min    Comment: (NOTE) The eGFR has been calculated using the CKD EPI equation. This calculation has not been validated in all clinical situations. eGFR's persistently <60 mL/min signify possible Chronic Kidney Disease.    Anion gap 14 5 - 15  Comprehensive metabolic panel     Status: Abnormal   Collection Time: 04/01/15  6:34 AM  Result Value Ref Range   Sodium 135 135 - 145 mmol/L   Potassium 3.1 (L) 3.5 - 5.1 mmol/L   Chloride 107 101 - 111 mmol/L   CO2 18 (L) 22 - 32 mmol/L   Glucose, Bld 104 (H) 65 - 99 mg/dL   BUN 64 (H) 6 - 20 mg/dL   Creatinine, Ser 4.74 (H) 0.61 - 1.24 mg/dL   Calcium 7.3 (L) 8.9 - 10.3 mg/dL   Total Protein 5.4 (L) 6.5 - 8.1 g/dL   Albumin 2.0 (L) 3.5 - 5.0 g/dL   AST 75 (H) 15 - 41 U/L   ALT 53 17 - 63 U/L   Alkaline Phosphatase 105 38 - 126 U/L   Total Bilirubin 0.4 0.3 - 1.2 mg/dL   GFR calc non Af Amer 13 (L) >60 mL/min   GFR calc Af Amer 15 (L) >60 mL/min    Comment: (NOTE) The eGFR has been calculated using the CKD EPI equation. This calculation has not been validated in all clinical situations. eGFR's persistently <60 mL/min signify possible Chronic Kidney Disease.    Anion gap 10 5 - 15  CBC  Status: Abnormal   Collection Time: 04/01/15  6:34 AM  Result Value Ref Range   WBC 27.3 (H) 4.0 - 10.5 K/uL   RBC  4.19 (L) 4.22 - 5.81 MIL/uL   Hemoglobin 11.2 (L) 13.0 - 17.0 g/dL   HCT 32.6 (L) 39.0 - 52.0 %   MCV 77.8 (L) 78.0 - 100.0 fL   MCH 26.7 26.0 - 34.0 pg   MCHC 34.4 30.0 - 36.0 g/dL   RDW 15.6 (H) 11.5 - 15.5 %   Platelets 447 (H) 150 - 400 K/uL   Recent Results (from the past 240 hour(s))  Urine culture     Status: None (Preliminary result)   Collection Time: 03/31/15  3:52 PM  Result Value Ref Range Status   Specimen Description URINE, CATHETERIZED  Final   Special Requests NONE  Final   Culture   Final    NO GROWTH < 12 HOURS Performed at [facility]    Report Status PENDING  Incomplete  MRSA PCR Screening     Status: None   Collection Time: 03/31/15  6:21 PM  Result Value Ref Range Status   MRSA by PCR NEGATIVE NEGATIVE Final    Comment:        The GeneXpert MRSA Assay (FDA approved for NASAL specimens only), is one component of a comprehensive MRSA colonization surveillance program. It is not intended to diagnose MRSA infection nor to guide or monitor treatment for MRSA infections.    Creatinine:  Recent Labs  03/31/15 1523 03/31/15 1532 03/31/15 2230 04/01/15 0634  CREATININE 5.59* 5.40* 5.03* 4.74*    Xrays: See report/chart Reviewed   Impression/Assessment:  Foley has been repositioned since xray Draining very well  Plan:  Suggest to leave it as is so we don't get into a situation of not being able to reinsert another one in the face of sepsis; no need to change due to sepsis in my opinion; send to see Dr Janice Norrie within one week of discharge for ongoing catheter care and management of ca prostate and retention; let me know over weekend if things changes  Manaia Samad A 04/01/2015, 3:45 PM

## 2015-04-01 NOTE — Progress Notes (Signed)
Initial Nutrition Assessment  DOCUMENTATION CODES:   Not applicable  INTERVENTION:  None at this time   NUTRITION DIAGNOSIS:   None at this time.    GOAL:   Patient will meet greater than or equal to 90% of their needs    MONITOR:   PO intake, Labs, Diet advancement, I & O's, Weight trends  REASON FOR ASSESSMENT:   Malnutrition Screening Tool    ASSESSMENT:   Pt presents with hx paranoid schizophrenia, prostate cancer, foley catheter. Was found at home AMS, presents with Acute Encephalopathy, CR 5.5, BUN 63, K 2.3. Acute-on-Chronic Renal Failure. Pt was lethargic, poor to respond, when I spoke with him, only nodding yes or shaking his head no. Says he has lost wt, but is eating regularly. NPFA shows him as well nourished.  Right around IBW. Will monitor for changes. No intervention at this time. Mst score likely due to confused neurological state.   Diet Order:  Diet renal with fluid restriction Fluid restriction:: 1200 mL Fluid; Room service appropriate?: Yes; Fluid consistency:: Thin  Skin:  Wound (see comment) (Moisture Related Abrasions on  anterior left and right knees, MSAD perirectal area.)  Last BM:  03/31/2015  Height:   Ht Readings from Last 1 Encounters:  03/31/15 5\' 11"  (1.803 m)    Weight:   Wt Readings from Last 1 Encounters:  04/01/15 175 lb 4.3 oz (79.5 kg)    Ideal Body Weight:  78.18 kg  BMI:  Body mass index is 24.46 kg/(m^2).  Estimated Nutritional Needs:   Kcal:  2000-2200  Protein:  70-80  Fluid:  >=/ 2L  EDUCATION NEEDS:   No education needs identified at this time  Satira Anis. Shereece Wellborn, MS, RD LDN After Hours/Weekend Pager [phone]

## 2015-04-01 NOTE — Progress Notes (Signed)
Patient with watery diarrhea. Patient stated he has had diarrhea for 4-5 days. No c/o abdominal pain and pt is afebrile. NP on call notified. Order for C-diff sample and enteric precautions placed. Patient placed on enteric precautions and advised of policies and procedures. Patient verbalized understanding. Will continue to monitor closely.

## 2015-04-01 NOTE — Care Management Note (Signed)
Case Management Note  Patient Details  Name: Edwin [name] MRN: [id number] Date of Birth: Apr 19, 1956  Subjective/Objective:           sepsis         Action/Plan: Date:  Sept.2, 2016 U.R. performed for needs and level of care. Will continue to follow for Case Management needs.  Velva Harman, RN, BSN, [place]   [phone]  Expected Discharge Date:   (unknown)               Expected Discharge Plan:  Home/Self Care  In-House Referral:  NA  Discharge planning Services  CM Consult  Post Acute Care Choice:  NA Choice offered to:  NA  DME Arranged:    DME Agency:     HH Arranged:    [place] Agency:     Status of Service:  Completed, signed off  Medicare Important Message Given:    Date Medicare IM Given:    Medicare IM give by:    Date Additional Medicare IM Given:    Additional Medicare Important Message give by:     If discussed at [place] of Stay Meetings, dates discussed:    Additional Comments:  Leeroy Cha, RN 04/01/2015, 12:01 PM

## 2015-04-01 NOTE — H&P (Addendum)
[facility] PROGRESS NOTE  KALUP JAQUITH BUL:[id number] DOB: 20-Jul-1956 DOA: 03/31/2015 PCP: [facility]  Assessment/Plan: 1-Acute encephalopathy:  Patient MS improved. He is alert, oriented to place, time and situation.  Likely multifactorial, metabolic encephalopathy, infectious process.  CT head negative.  ABG no hypercapnia, salicylate negative, ammonia at 19, tylenol level negative, TSH at 0.519.  -Acute on chronic renal failure.  Suspect related to hypovolemia, hypotension. Obstructive uropathy.  Renal US with malposition of foley. Bilateral hydronephrosis.  CK level at 500, continue with IV fluids.   Renal function improving.  Urology consulted.   3-Hypokalemia: improved. Will replace with oral supplement.   4-Sepsis: source probably UTI./ IV fluids, IV antibiotics. Continue with Vancomycin and zosyn.  Presents with hypotension, leukocytosis, tachycardia.  Needs foley exchange. And urology evaluation. Also patient prior biopsy report polyp cystitis. Does he needs prolong course of antibiotics for this ?   5-Metabolic acidosis; improving with IV fluids.   6-Hyponatremia; improved with IV fluids.   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   8-Schizophrenia: resume clozapine.   Code Status: Full Code.  Family Communication: Care discussed with patient.  Disposition Plan: Remain in the step down unit   Consultants: Urology : Findings are similar to the prior CT. There is significant dilation of both renal pelvises and the intrarenal collecting systems, left greater than right. Bladder shows irregular thickened wall and dependent debris. Hydronephrosis is likely on the basis of bladder outlet obstruction. 2. Foley catheter has been inserted but does not enter the bladder.  Tip appears to be in the prostatic urethra.  Procedures:  Renal US;   Antibiotics:  Vancomycin   Zosyn  HPI/Subjective: Patient is alert this am.   Following commands.  He relates that he fell down and was not able to stand up. Denies pain.   Objective: Filed Vitals:   04/01/15 0400  BP: 108/64  Pulse: 84  Temp:   Resp: 26    Intake/Output Summary (Last 24 hours) at 04/01/15 0805 Last data filed at 04/01/15 0700  Gross per 24 hour  Intake   4100 ml  Output   1975 ml  Net   2125 ml   Filed Weights   03/31/15 1514 03/31/15 1700 04/01/15 0300  Weight: 79.833 kg (176 lb) 80 kg (176 lb 5.9 oz) 79.5 kg (175 lb 4.3 oz)    Exam:   General: Alert in no distress.   Cardiovascular: S 1, S 2 RRR  Respiratory: CTA  Abdomen: BS present, soft, nt  Musculoskeletal: no edema  Data Reviewed: Basic Metabolic Panel:  Recent Labs Lab 03/31/15 1523 03/31/15 1532 03/31/15 2230 04/01/15 0634  NA 128* 127* 134* 135  K 2.5* 2.3* 2.8* 3.1*  CL 95* 95* 104 107  CO2 17*  --  16* 18*  GLUCOSE 115* 118* 106* 104*  BUN 77* 63* 66* 64*  CREATININE 5.59* 5.40* 5.03* 4.74*  CALCIUM 7.7*  --  7.2* 7.3*  MG 2.3  --   --   --    Liver Function Tests:  Recent Labs Lab 03/31/15 1523 04/01/15 0634  AST 60* 75*  ALT 48 53  ALKPHOS 116 105  BILITOT 0.7 0.4  PROT 6.2* 5.4*  ALBUMIN 2.4* 2.0*   No results for input(s): LIPASE, AMYLASE in the last 168 hours.  Recent Labs Lab 03/31/15 1958  AMMONIA 19   CBC:  Recent Labs Lab 03/31/15 1523 03/31/15 1532 04/01/15 0634  WBC 27.5*  --  27.3*  NEUTROABS 24.4*  --   --  HGB 11.1* 12.2* 11.2*  HCT 31.1* 36.0* 32.6*  MCV 77.4*  --  77.8*  PLT 400  --  447*   Cardiac Enzymes:  Recent Labs Lab 03/31/15 1523  CKTOTAL 512*   BNP (last 3 results) No results for input(s): BNP in the last 8760 hours.  ProBNP (last 3 results) No results for input(s): PROBNP in the last 8760 hours.  CBG: No results for input(s): GLUCAP in the last 168 hours.  Recent Results (from the past 240 hour(s))  Urine culture     Status: None (Preliminary result)   Collection Time: 03/31/15   3:52 PM  Result Value Ref Range Status   Specimen Description URINE, CATHETERIZED  Final   Special Requests NONE  Final   Culture   Final    NO GROWTH < 12 HOURS Performed at [facility]    Report Status PENDING  Incomplete  MRSA PCR Screening     Status: None   Collection Time: 03/31/15  6:21 PM  Result Value Ref Range Status   MRSA by PCR NEGATIVE NEGATIVE Final    Comment:        The GeneXpert MRSA Assay (FDA approved for NASAL specimens only), is one component of a comprehensive MRSA colonization surveillance program. It is not intended to diagnose MRSA infection nor to guide or monitor treatment for MRSA infections.      Studies: Ct Head Wo Contrast  03/31/2015   CLINICAL DATA:  Altered mental status and hypotension.  EXAM: CT HEAD WITHOUT CONTRAST  TECHNIQUE: Contiguous axial images were obtained from the base of the skull through the vertex without intravenous contrast.  COMPARISON:  Head CT scan 01/13/2015.  FINDINGS: The brain appears normal without hemorrhage, infarct, mass lesion, mass effect, midline shift or abnormal extra-axial fluid collection. No hydrocephalus or pneumocephalus. Imaged paranasal sinuses and mastoid air cells are clear. The calvarium is intact. Tiny locules of gas in soft tissues of the upper neck in right base are likely related to venous gas access.  IMPRESSION: No acute abnormality.  Negative exam.   Electronically Signed   By: Inge Rise M.D.   On: 03/31/2015 17:12   US Renal Port  03/31/2015   CLINICAL DATA:  Renal failure.  EXAM: RENAL / URINARY TRACT ULTRASOUND COMPLETE  COMPARISON:  CT, 02/18/2015  FINDINGS: Right Kidney:  Length: 16 cm. Renal sinus cysts versus, more likely, moderate hydronephrosis. The renal pelvis was not extrarenal pelvis is significantly dilated. No solid renal masses. No stones.  Left Kidney:  Length: 16.5 cm. Marked hydronephrosis, particularly distention of the renal pelvis. No solid renal masses.  Bladder:   Wall is thickened and irregular. There is dependent debris. No discrete bladder mass. Bladder is distended. There is an inserted falling, but the balloon is not evident in the bladder. It appears to reside in the prostatic urethra.  Prostate is enlarged measuring 6.7 x 6.1 x 6.3 cm.  IMPRESSION: 1. Findings are similar to the prior CT. There is significant dilation of both renal pelvises and the intrarenal collecting systems, left greater than right. Bladder shows irregular thickened wall and dependent debris. Hydronephrosis is likely on the basis of bladder outlet obstruction. 2. Foley catheter has been inserted but does not enter the bladder. Tip appears to be in the prostatic urethra.   Electronically Signed   By: Lajean Manes M.D.   On: 03/31/2015 20:14   Dg Chest Port 1 View  03/31/2015   CLINICAL DATA:  Weakness with altered  mental status  EXAM: PORTABLE CHEST - 1 VIEW  COMPARISON:  January 13, 2015  FINDINGS: There is no edema or consolidation. The heart size and pulmonary vascularity normal. No adenopathy. There is postoperative change in the lower cervical spine.  IMPRESSION: No edema or consolidation.   Electronically Signed   By: Lowella Grip III M.D.   On: 03/31/2015 15:46    Scheduled Meds: . folic acid  1 mg Intravenous Daily  . heparin  5,000 Units Subcutaneous 3 times per day  . piperacillin-tazobactam (ZOSYN)  IV  2.25 g Intravenous Q8H  . potassium chloride  40 mEq Oral Once  . sodium chloride  3 mL Intravenous Q12H  . thiamine  100 mg Intravenous Daily  . [START ON 04/02/2015] vancomycin  1,000 mg Intravenous Q48H   Continuous Infusions: . sodium chloride 125 mL/hr at 03/31/15 1817    Active Problems:   Paranoid schizophrenia, chronic condition   AKI (acute kidney injury)   Sepsis   Acute encephalopathy    Time spent: 70 minunets    [place], [facility] Pager [phone]. If 7PM-7AM, please contact night-coverage at [email or web address], password  [facility] 04/01/2015, 8:05 AM  LOS: 1 day

## 2015-04-01 NOTE — Clinical Documentation Improvement (Signed)
Internal Medicine  Please clarify the probable etiology of the patient's sepsis: source possible UTI.   Sepsis secondary to UTI as documented  Sepsis secondary to UTI secondary to indwelling Foley catheter   Other  Clinically Undetermined  Please exercise your independent, professional judgment when responding. A specific answer is not anticipated or expected.  Thank You, [place]

## 2015-04-01 NOTE — Progress Notes (Signed)
Potassium is 2.8 at this time. NP on call notified and orders received. Will continue to monitor

## 2015-04-02 DIAGNOSIS — A419 Sepsis, unspecified organism: Secondary | ICD-10-CM

## 2015-04-02 LAB — BASIC METABOLIC PANEL
ANION GAP: 8 (ref 5–15)
BUN: 54 mg/dL — ABNORMAL HIGH (ref 6–20)
CALCIUM: 7.6 mg/dL — AB (ref 8.9–10.3)
CHLORIDE: 111 mmol/L (ref 101–111)
CO2: 20 mmol/L — AB (ref 22–32)
Creatinine, Ser: 4.18 mg/dL — ABNORMAL HIGH (ref 0.61–1.24)
GFR calc non Af Amer: 15 mL/min — ABNORMAL LOW (ref >60)
GFR, EST AFRICAN AMERICAN: 17 mL/min — AB (ref >60)
Glucose, Bld: 119 mg/dL — ABNORMAL HIGH (ref 65–99)
Potassium: 2.9 mmol/L — ABNORMAL LOW (ref 3.5–5.1)
Sodium: 139 mmol/L (ref 135–145)

## 2015-04-02 LAB — CBC
HEMATOCRIT: 32.9 % — AB (ref 39.0–52.0)
HEMOGLOBIN: 11.3 g/dL — AB (ref 13.0–17.0)
MCH: 26.8 pg (ref 26.0–34.0)
MCHC: 34.3 g/dL (ref 30.0–36.0)
MCV: 78 fL (ref 78.0–100.0)
Platelets: 428 10*3/uL — ABNORMAL HIGH (ref 150–400)
RBC: 4.22 MIL/uL (ref 4.22–5.81)
RDW: 15.9 % — ABNORMAL HIGH (ref 11.5–15.5)
WBC: 24 10*3/uL — AB (ref 4.0–10.5)

## 2015-04-02 LAB — C DIFFICILE QUICK SCREEN W PCR REFLEX
C DIFFICILE (CDIFF) INTERP: POSITIVE
C DIFFICILE (CDIFF) TOXIN: POSITIVE — AB
C Diff antigen: POSITIVE — AB

## 2015-04-02 MED ORDER — POTASSIUM CHLORIDE CRYS ER 20 MEQ PO TBCR
20.0000 meq | EXTENDED_RELEASE_TABLET | Freq: Once | ORAL | Status: AC
  Administered 2015-04-02: 20 meq via ORAL
  Filled 2015-04-02: qty 1

## 2015-04-02 MED ORDER — VANCOMYCIN 50 MG/ML ORAL SOLUTION
125.0000 mg | Freq: Four times a day (QID) | ORAL | Status: DC
  Administered 2015-04-02 – 2015-04-07 (×22): 125 mg via ORAL
  Filled 2015-04-02 (×24): qty 2.5

## 2015-04-02 MED ORDER — POTASSIUM CHLORIDE CRYS ER 20 MEQ PO TBCR
40.0000 meq | EXTENDED_RELEASE_TABLET | Freq: Once | ORAL | Status: AC
  Administered 2015-04-02: 40 meq via ORAL
  Filled 2015-04-02: qty 2

## 2015-04-02 NOTE — H&P (Signed)
[facility] PROGRESS NOTE  FINBAR NIPPERT NIO:[id number] DOB: 05-18-1956 DOA: 03/31/2015 PCP: [facility]  Assessment/Plan: 1-Acute encephalopathy:  Patient MS improved. He is alert, oriented to place, time and situation.  Likely multifactorial, metabolic encephalopathy, infectious process.  CT head negative.  ABG no hypercapnia, salicylate negative, ammonia at 19, tylenol level negative, TSH at 0.519.  -Acute on chronic renal failure.  Suspect related to hypovolemia, hypotension. Obstructive uropathy.  Renal US with malposition of foley. Bilateral hydronephrosis.  CK level at 500, continue with IV fluids.   Renal function improving.    3-Hypokalemia: improved. Will replace with oral supplement.   4-Sepsis: source probably UTI secondary to indwelling foley catheter and C diff.   IV fluids, IV antibiotics. Continue with zosyn.  Presents with hypotension, leukocytosis, tachycardia.  Will discontinue IV vancomycin.  Now with C diff. Started on oral vancomycin.  5-Metabolic acidosis; improving with IV fluids.   6-Hyponatremia; improved with IV fluids.   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   8-Schizophrenia: resume clozapine.  9-C diff colitis ; patient on admission was not able to provide history due to AMS. He nows relates having diarrhea at home for 4 days. \ He had diarrhea yesterday. C diff came back positive. Started on oral vancomycin.     Code Status: Full Code.  Family Communication: Care discussed with patient.  Disposition Plan: Remain in the step down unit   Consultants: Urology : Findings are similar to the prior CT. There is significant dilation of both renal pelvises and the intrarenal collecting systems, left greater than right. Bladder shows irregular thickened wall and dependent debris. Hydronephrosis is likely on the basis of bladder outlet obstruction. 2. Foley catheter has been inserted but does not enter the  bladder.  Tip appears to be in the prostatic urethra.  Procedures:  Renal US;   Antibiotics:  Vancomycin   Zosyn  HPI/Subjective: Patient is alert this am.  Following commands. Having diarrhea. Denies abdominal pain.    Objective: Filed Vitals:   04/02/15 1312  BP: 110/72  Pulse: 85  Temp: 97.5 F (36.4 C)  Resp: 20    Intake/Output Summary (Last 24 hours) at 04/02/15 1509 Last data filed at 04/02/15 1329  Gross per 24 hour  Intake   2160 ml  Output   2125 ml  Net     35 ml   Filed Weights   03/31/15 1514 03/31/15 1700 04/01/15 0300  Weight: 79.833 kg (176 lb) 80 kg (176 lb 5.9 oz) 79.5 kg (175 lb 4.3 oz)    Exam:   General: Alert in no distress.   Cardiovascular: S 1, S 2 RRR  Respiratory: CTA  Abdomen: BS present, soft, nt  Musculoskeletal: no edema  Data Reviewed: Basic Metabolic Panel:  Recent Labs Lab 03/31/15 1523 03/31/15 1532 03/31/15 2230 04/01/15 0634 04/02/15 0703  NA 128* 127* 134* 135 139  K 2.5* 2.3* 2.8* 3.1* 2.9*  CL 95* 95* 104 107 111  CO2 17*  --  16* 18* 20*  GLUCOSE 115* 118* 106* 104* 119*  BUN 77* 63* 66* 64* 54*  CREATININE 5.59* 5.40* 5.03* 4.74* 4.18*  CALCIUM 7.7*  --  7.2* 7.3* 7.6*  MG 2.3  --   --   --   --    Liver Function Tests:  Recent Labs Lab 03/31/15 1523 04/01/15 0634  AST 60* 75*  ALT 48 53  ALKPHOS 116 105  BILITOT 0.7 0.4  PROT 6.2* 5.4*  ALBUMIN 2.4*  2.0*   No results for input(s): LIPASE, AMYLASE in the last 168 hours.  Recent Labs Lab 03/31/15 1958  AMMONIA 19   CBC:  Recent Labs Lab 03/31/15 1523 03/31/15 1532 04/01/15 0634 04/02/15 0703  WBC 27.5*  --  27.3* 24.0*  NEUTROABS 24.4*  --   --   --   HGB 11.1* 12.2* 11.2* 11.3*  HCT 31.1* 36.0* 32.6* 32.9*  MCV 77.4*  --  77.8* 78.0  PLT 400  --  447* 428*   Cardiac Enzymes:  Recent Labs Lab 03/31/15 1523  CKTOTAL 512*   BNP (last 3 results) No results for input(s): BNP in the last 8760 hours.  ProBNP (last  3 results) No results for input(s): PROBNP in the last 8760 hours.  CBG: No results for input(s): GLUCAP in the last 168 hours.  Recent Results (from the past 240 hour(s))  Blood Culture (routine x 2)     Status: None (Preliminary result)   Collection Time: 03/31/15  3:10 PM  Result Value Ref Range Status   Specimen Description BLOOD RIGHT ANTECUBITAL  Final   Special Requests BOTTLES DRAWN AEROBIC AND ANAEROBIC 5CC  Final   Culture   Final    NO GROWTH 2 DAYS Performed at [facility]    Report Status PENDING  Incomplete  Blood Culture (routine x 2)     Status: None (Preliminary result)   Collection Time: 03/31/15  3:20 PM  Result Value Ref Range Status   Specimen Description BLOOD LEFT HAND  Final   Special Requests BOTTLES DRAWN AEROBIC ONLY [place]  Final   Culture   Final    NO GROWTH 2 DAYS Performed at [facility]    Report Status PENDING  Incomplete  Urine culture     Status: None   Collection Time: 03/31/15  3:52 PM  Result Value Ref Range Status   Specimen Description URINE, CATHETERIZED  Final   Special Requests NONE  Final   Culture   Final    NO GROWTH 1 DAY Performed at [facility]    Report Status 04/01/2015 FINAL  Final  MRSA PCR Screening     Status: None   Collection Time: 03/31/15  6:21 PM  Result Value Ref Range Status   MRSA by PCR NEGATIVE NEGATIVE Final    Comment:        The GeneXpert MRSA Assay (FDA approved for NASAL specimens only), is one component of a comprehensive MRSA colonization surveillance program. It is not intended to diagnose MRSA infection nor to guide or monitor treatment for MRSA infections.   C difficile quick scan w PCR reflex     Status: Abnormal   Collection Time: 04/02/15  5:57 AM  Result Value Ref Range Status   C Diff antigen POSITIVE (A) NEGATIVE Final   C Diff toxin POSITIVE (A) NEGATIVE Final   C Diff interpretation Positive for toxigenic C. difficile  Final    Comment: CRITICAL RESULT  CALLED TO, READ BACK BY AND VERIFIED WITH: GARCIA,K/4W @0723  ON 04/02/15 BY KARCZEWSKI,S.      Studies: Ct Head Wo Contrast  03/31/2015   CLINICAL DATA:  Altered mental status and hypotension.  EXAM: CT HEAD WITHOUT CONTRAST  TECHNIQUE: Contiguous axial images were obtained from the base of the skull through the vertex without intravenous contrast.  COMPARISON:  Head CT scan 01/13/2015.  FINDINGS: The brain appears normal without hemorrhage, infarct, mass lesion, mass effect, midline shift or abnormal extra-axial fluid collection. No  hydrocephalus or pneumocephalus. Imaged paranasal sinuses and mastoid air cells are clear. The calvarium is intact. Tiny locules of gas in soft tissues of the upper neck in right base are likely related to venous gas access.  IMPRESSION: No acute abnormality.  Negative exam.   Electronically Signed   By: Inge Rise M.D.   On: 03/31/2015 17:12   US Renal Port  03/31/2015   CLINICAL DATA:  Renal failure.  EXAM: RENAL / URINARY TRACT ULTRASOUND COMPLETE  COMPARISON:  CT, 02/18/2015  FINDINGS: Right Kidney:  Length: 16 cm. Renal sinus cysts versus, more likely, moderate hydronephrosis. The renal pelvis was not extrarenal pelvis is significantly dilated. No solid renal masses. No stones.  Left Kidney:  Length: 16.5 cm. Marked hydronephrosis, particularly distention of the renal pelvis. No solid renal masses.  Bladder:  Wall is thickened and irregular. There is dependent debris. No discrete bladder mass. Bladder is distended. There is an inserted falling, but the balloon is not evident in the bladder. It appears to reside in the prostatic urethra.  Prostate is enlarged measuring 6.7 x 6.1 x 6.3 cm.  IMPRESSION: 1. Findings are similar to the prior CT. There is significant dilation of both renal pelvises and the intrarenal collecting systems, left greater than right. Bladder shows irregular thickened wall and dependent debris. Hydronephrosis is likely on the basis of bladder  outlet obstruction. 2. Foley catheter has been inserted but does not enter the bladder. Tip appears to be in the prostatic urethra.   Electronically Signed   By: Lajean Manes M.D.   On: 03/31/2015 20:14   Dg Chest Port 1 View  03/31/2015   CLINICAL DATA:  Weakness with altered mental status  EXAM: PORTABLE CHEST - 1 VIEW  COMPARISON:  January 13, 2015  FINDINGS: There is no edema or consolidation. The heart size and pulmonary vascularity normal. No adenopathy. There is postoperative change in the lower cervical spine.  IMPRESSION: No edema or consolidation.   Electronically Signed   By: Lowella Grip III M.D.   On: 03/31/2015 15:46    Scheduled Meds: . benztropine  0.5 mg Oral q morning - 10a  . calcium carbonate  1 tablet Oral BID WC  . clotrimazole   Topical BID  . clozapine  800 mg Oral QHS  . folic acid  1 mg Intravenous Daily  . heparin  5,000 Units Subcutaneous 3 times per day  . piperacillin-tazobactam (ZOSYN)  IV  2.25 g Intravenous Q8H  . sodium chloride  3 mL Intravenous Q12H  . tamsulosin  0.4 mg Oral QPC supper  . thiamine  100 mg Intravenous Daily  . vancomycin  125 mg Oral QID   Continuous Infusions: . sodium chloride 125 mL/hr at 04/02/15 7494    Active Problems:   Paranoid schizophrenia, chronic condition   AKI (acute kidney injury)   Sepsis   Acute encephalopathy    Time spent: 38 minunets    [place], [place] [facility] Pager [phone]. If 7PM-7AM, please contact night-coverage at [email or web address], password [facility] 04/02/2015, 3:09 PM  LOS: 2 days

## 2015-04-03 ENCOUNTER — Inpatient Hospital Stay (HOSPITAL_COMMUNITY): Payer: Medicare Other

## 2015-04-03 LAB — CBC
HEMATOCRIT: 30.9 % — AB (ref 39.0–52.0)
HEMOGLOBIN: 10.2 g/dL — AB (ref 13.0–17.0)
MCH: 26.2 pg (ref 26.0–34.0)
MCHC: 33 g/dL (ref 30.0–36.0)
MCV: 79.2 fL (ref 78.0–100.0)
Platelets: 443 10*3/uL — ABNORMAL HIGH (ref 150–400)
RBC: 3.9 MIL/uL — ABNORMAL LOW (ref 4.22–5.81)
RDW: 16.1 % — AB (ref 11.5–15.5)
WBC: 19 10*3/uL — ABNORMAL HIGH (ref 4.0–10.5)

## 2015-04-03 LAB — BASIC METABOLIC PANEL
Anion gap: 9 (ref 5–15)
BUN: 40 mg/dL — AB (ref 6–20)
CALCIUM: 7.8 mg/dL — AB (ref 8.9–10.3)
CHLORIDE: 113 mmol/L — AB (ref 101–111)
CO2: 19 mmol/L — AB (ref 22–32)
CREATININE: 3.4 mg/dL — AB (ref 0.61–1.24)
GFR calc Af Amer: 22 mL/min — ABNORMAL LOW (ref >60)
GFR calc non Af Amer: 19 mL/min — ABNORMAL LOW (ref >60)
GLUCOSE: 103 mg/dL — AB (ref 65–99)
Potassium: 3.2 mmol/L — ABNORMAL LOW (ref 3.5–5.1)
Sodium: 141 mmol/L (ref 135–145)

## 2015-04-03 MED ORDER — PIPERACILLIN-TAZOBACTAM 3.375 G IVPB
3.3750 g | Freq: Three times a day (TID) | INTRAVENOUS | Status: DC
  Administered 2015-04-03 – 2015-04-05 (×6): 3.375 g via INTRAVENOUS
  Filled 2015-04-03 (×7): qty 50

## 2015-04-03 MED ORDER — BENZTROPINE MESYLATE 0.5 MG PO TABS
0.5000 mg | ORAL_TABLET | Freq: Every morning | ORAL | Status: DC
  Administered 2015-04-03 – 2015-04-07 (×5): 0.5 mg via ORAL
  Filled 2015-04-03 (×6): qty 1

## 2015-04-03 MED ORDER — POTASSIUM CHLORIDE CRYS ER 20 MEQ PO TBCR
20.0000 meq | EXTENDED_RELEASE_TABLET | Freq: Once | ORAL | Status: AC
  Administered 2015-04-03: 20 meq via ORAL
  Filled 2015-04-03: qty 1

## 2015-04-03 MED ORDER — POTASSIUM CHLORIDE CRYS ER 20 MEQ PO TBCR
40.0000 meq | EXTENDED_RELEASE_TABLET | Freq: Once | ORAL | Status: AC
  Administered 2015-04-03: 40 meq via ORAL
  Filled 2015-04-03: qty 2

## 2015-04-03 NOTE — H&P (Signed)
[facility] PROGRESS NOTE  NICHAEL EHLY KGM:[id number] DOB: [birth year] DOA: 03/31/2015 PCP: [facility]  Assessment/Plan: 1-Acute encephalopathy:  Patient MS improved. He is alert, oriented to place, time and situation.  Likely multifactorial, metabolic encephalopathy, infectious process.  CT head negative.  ABG no hypercapnia, salicylate negative, ammonia at 19, tylenol level negative, TSH at 0.519. -improving.   2-Acute on chronic renal failure.  Suspect related to hypovolemia, hypotension. Obstructive uropathy.  Renal US with malposition of foley. Bilateral hydronephrosis.  CK level at 500, continue with IV fluids.  Decrease rate.  Renal function improving. Cr peak at 5.5 on admission. Today at 3.   3-C diff colitis ; patient on admission was not able to provide history due to AMS. He nows relates having diarrhea at home for 4 days.  C diff positive. Started on oral vancomycin. Day 2.  Abdomen distended. Check KUB.  WBC trending down.   4-Hypokalemia:  Will replace with oral supplement.   5-Sepsis: source probably UTI secondary to indwelling foley catheter and C diff.   IV fluids, IV antibiotics. Continue with zosyn.  Presents with hypotension, leukocytosis, tachycardia.  discontinue IV vancomycin.  Now with C diff. Started on oral vancomycin. If urine culture remain negative will consider discontinue Zosyn in 24 to 48 hours.   6-Metabolic acidosis; improving with IV fluids.   7-Hyponatremia; improved with IV fluids.   8-High grade prostatic intraepithelial Neoplasm: follow up with urology out patient.   9-Schizophrenia: Continue with  clozapine.      Code Status: Full Code.  Family Communication: Care discussed with patient.  Disposition Plan: Remain in the step down unit   Consultants: Urology : Findings are similar to the prior CT. There is significant dilation of both renal pelvises and the intrarenal collecting systems, left greater than right.  Bladder shows irregular thickened wall and dependent debris. Hydronephrosis is likely on the basis of bladder outlet obstruction. 2. Foley catheter has been inserted but does not enter the bladder.  Tip appears to be in the prostatic urethra.  Procedures:  Renal US;   Antibiotics:  Vancomycin   Zosyn  HPI/Subjective: Patient is alert this am.  Following commands. Having diarrhea. Denies abdominal pain.    Objective: Filed Vitals:   04/03/15 0626  BP: 120/78  Pulse: 87  Temp: 98.8 F (37.1 C)  Resp: 20    Intake/Output Summary (Last 24 hours) at 04/03/15 1119 Last data filed at 04/03/15 0900  Gross per 24 hour  Intake 2268.33 ml  Output   2050 ml  Net 218.33 ml   Filed Weights   03/31/15 1514 03/31/15 1700 04/01/15 0300  Weight: 79.833 kg (176 lb) 80 kg (176 lb 5.9 oz) 79.5 kg (175 lb 4.3 oz)    Exam:   General: Alert in no distress.   Cardiovascular: S 1, S 2 RRR  Respiratory: CTA  Abdomen: BS present, soft, nt  Musculoskeletal: no edema  Data Reviewed: Basic Metabolic Panel:  Recent Labs Lab 03/31/15 1523 03/31/15 1532 03/31/15 2230 04/01/15 0634 04/02/15 0703 04/03/15 0600  NA 128* 127* 134* 135 139 141  K 2.5* 2.3* 2.8* 3.1* 2.9* 3.2*  CL 95* 95* 104 107 111 113*  CO2 17*  --  16* 18* 20* 19*  GLUCOSE 115* 118* 106* 104* 119* 103*  BUN 77* 63* 66* 64* 54* 40*  CREATININE 5.59* 5.40* 5.03* 4.74* 4.18* 3.40*  CALCIUM 7.7*  --  7.2* 7.3* 7.6* 7.8*  MG 2.3  --   --   --   --   --  Liver Function Tests:  Recent Labs Lab 03/31/15 1523 04/01/15 0634  AST 60* 75*  ALT 48 53  ALKPHOS 116 105  BILITOT 0.7 0.4  PROT 6.2* 5.4*  ALBUMIN 2.4* 2.0*   No results for input(s): LIPASE, AMYLASE in the last 168 hours.  Recent Labs Lab 03/31/15 1958  AMMONIA 19   CBC:  Recent Labs Lab 03/31/15 1523 03/31/15 1532 04/01/15 0634 04/02/15 0703 04/03/15 0600  WBC 27.5*  --  27.3* 24.0* 19.0*  NEUTROABS 24.4*  --   --   --   --    HGB 11.1* 12.2* 11.2* 11.3* 10.2*  HCT 31.1* 36.0* 32.6* 32.9* 30.9*  MCV 77.4*  --  77.8* 78.0 79.2  PLT 400  --  447* 428* 443*   Cardiac Enzymes:  Recent Labs Lab 03/31/15 1523  CKTOTAL 512*   BNP (last 3 results) No results for input(s): BNP in the last 8760 hours.  ProBNP (last 3 results) No results for input(s): PROBNP in the last 8760 hours.  CBG: No results for input(s): GLUCAP in the last 168 hours.  Recent Results (from the past 240 hour(s))  Blood Culture (routine x 2)     Status: None (Preliminary result)   Collection Time: 03/31/15  3:10 PM  Result Value Ref Range Status   Specimen Description BLOOD RIGHT ANTECUBITAL  Final   Special Requests BOTTLES DRAWN AEROBIC AND ANAEROBIC 5CC  Final   Culture   Final    NO GROWTH 2 DAYS Performed at [facility]    Report Status PENDING  Incomplete  Blood Culture (routine x 2)     Status: None (Preliminary result)   Collection Time: 03/31/15  3:20 PM  Result Value Ref Range Status   Specimen Description BLOOD LEFT HAND  Final   Special Requests BOTTLES DRAWN AEROBIC ONLY [place]  Final   Culture   Final    NO GROWTH 2 DAYS Performed at [facility]    Report Status PENDING  Incomplete  Urine culture     Status: None   Collection Time: 03/31/15  3:52 PM  Result Value Ref Range Status   Specimen Description URINE, CATHETERIZED  Final   Special Requests NONE  Final   Culture   Final    NO GROWTH 1 DAY Performed at [facility]    Report Status 04/01/2015 FINAL  Final  MRSA PCR Screening     Status: None   Collection Time: 03/31/15  6:21 PM  Result Value Ref Range Status   MRSA by PCR NEGATIVE NEGATIVE Final    Comment:        The GeneXpert MRSA Assay (FDA approved for NASAL specimens only), is one component of a comprehensive MRSA colonization surveillance program. It is not intended to diagnose MRSA infection nor to guide or monitor treatment for MRSA infections.   C difficile  quick scan w PCR reflex     Status: Abnormal   Collection Time: 04/02/15  5:57 AM  Result Value Ref Range Status   C Diff antigen POSITIVE (A) NEGATIVE Final   C Diff toxin POSITIVE (A) NEGATIVE Final   C Diff interpretation Positive for toxigenic C. difficile  Final    Comment: CRITICAL RESULT CALLED TO, READ BACK BY AND VERIFIED WITH: GARCIA,K/4W @0723  ON 04/02/15 BY KARCZEWSKI,S.      Studies: No results found.  Scheduled Meds: . benztropine  0.5 mg Oral q morning - 10a  . calcium carbonate  1 tablet Oral BID  WC  . clotrimazole   Topical BID  . clozapine  800 mg Oral QHS  . folic acid  1 mg Intravenous Daily  . heparin  5,000 Units Subcutaneous 3 times per day  . piperacillin-tazobactam (ZOSYN)  IV  2.25 g Intravenous Q8H  . potassium chloride  20 mEq Oral Once  . sodium chloride  3 mL Intravenous Q12H  . tamsulosin  0.4 mg Oral QPC supper  . thiamine  100 mg Intravenous Daily  . vancomycin  125 mg Oral QID   Continuous Infusions: . sodium chloride 100 mL/hr at 04/03/15 1034    Active Problems:   Paranoid schizophrenia, chronic condition   AKI (acute kidney injury)   Sepsis   Acute encephalopathy    Time spent: 63 minunets    [place], [place] [facility] Pager [phone]. If 7PM-7AM, please contact night-coverage at [email or web address], password [facility] 04/03/2015, 11:19 AM  LOS: 3 days

## 2015-04-03 NOTE — Evaluation (Addendum)
Occupational Therapy Evaluation Patient Details Name: Edwin [name] MRN: [id number] DOB: 10-17-1955 Today's Date: 04/03/2015    History of Present Illness pt was found at home with AMS.  He was admitted with sepsis likely from uti, acute encephalopathy acute renal failure.  He has a h/o prostate CA, foley, paranoid schizophrenia   Clinical Impression   This 59 year old man was admitted for the above.  At baseline, pt was independent and lived alone.  He currently needs min A for mobility and max A for LB adls.  He will benefit from skilled OT to increase safety and independence with adls.  Goals in acute are set for supervision level     Follow Up Recommendations  SNF (unless pt has 24/7 supervision)    Equipment Recommendations  3 in 1 bedside comode    Recommendations for Other Services       Precautions / Restrictions Precautions Precautions: Fall Restrictions Weight Bearing Restrictions: No      Mobility Bed Mobility Overal bed mobility: Needs Assistance Bed Mobility: Supine to Sit     Supine to sit: Min guard     General bed mobility comments: for safety/managing lines  Transfers Overall transfer level: Needs assistance Equipment used: Standard walker;None Transfers: Sit to/from Stand Sit to Stand: Min assist         General transfer comment: pt lost balance getting up from commode    Balance                                            ADL Overall ADL's : Needs assistance/impaired     Grooming: Min guard;Wash/dry hands;Standing   Upper Body Bathing: Supervision/ safety;Sitting   Lower Body Bathing: Moderate assistance;Sit to/from stand   Upper Body Dressing : Minimal assistance;Sitting (lines)   Lower Body Dressing: Maximal assistance;Sit to/from stand   Toilet Transfer: Minimal assistance;Ambulation;Comfort height toilet;Grab bars (walker)   Toileting- Clothing Manipulation and Hygiene: Minimal assistance;Sit to/from  stand         General ADL Comments: ambulated to bathroom with standard walker:  pt very unsafe with this and ambulated back to chair with hand held assist.  Pt lost balance several times when ambulating:  he was carrying standard walker.  Will try RW on next visit     Vision     Perception     Praxis      Pertinent Vitals/Pain Pain Assessment: No/denies pain     Hand Dominance     Extremity/Trunk Assessment Upper Extremity Assessment Upper Extremity Assessment: Overall WFL for tasks assessed           Communication Communication Communication: No difficulties   Cognition Arousal/Alertness: Awake/alert Behavior During Therapy: WFL for tasks assessed/performed Overall Cognitive Status: No family/caregiver present to determine baseline cognitive functioning (decreased safety)                     General Comments       Exercises       Shoulder Instructions      Home Living Family/patient expects to be discharged to:: Unsure                   Bathroom Shower/Tub: Teacher, early years/pre: Standard         Additional Comments: pt states that he lives alone      Prior  Functioning/Environment Level of Independence: Independent             OT Diagnosis: Generalized weakness   OT Problem List: Decreased strength;Decreased activity tolerance;Impaired balance (sitting and/or standing);Decreased knowledge of use of DME or AE;Decreased safety awareness   OT Treatment/Interventions: Self-care/ADL training;DME and/or AE instruction;Patient/family education;Balance training (safety)    OT Goals(Current goals can be found in the care plan section) Acute Rehab OT Goals Patient Stated Goal: none stated OT Goal Formulation: With patient Time For Goal Achievement: 04/10/15 Potential to Achieve Goals: Good ADL Goals Pt Will Perform Grooming: with supervision;standing Pt Will Perform Lower Body Bathing: with supervision;with adaptive  equipment;sit to/from stand Pt Will Perform Lower Body Dressing: with supervision;with adaptive equipment;sit to/from stand Pt Will Transfer to Toilet: with supervision;ambulating;regular height toilet;bedside commode (vs) Pt Will Perform Toileting - Clothing Manipulation and hygiene: with supervision;sit to/from stand  OT Frequency: Min 2X/week   Barriers to D/C:            Co-evaluation              End of Session Nurse Communication: Mobility status  Activity Tolerance: Patient limited by fatigue Patient left: in chair;with call bell/phone within reach;with chair alarm set   Time: 9629-5284 OT Time Calculation (min): 20 min Charges:  OT General Charges $OT Visit: 1 Procedure OT Evaluation $Initial OT Evaluation Tier I: 1 Procedure G-Codes:    [place] Liddicoat 30-Apr-2015, 10:32 AM   Lesle Chris, OTR/L [phone] 04-30-2015

## 2015-04-03 NOTE — Progress Notes (Signed)
ANTIBIOTIC CONSULT NOTE - FOLLOW UP  Pharmacy Consult for Zosyn Indication: Urosepsis  No Known Allergies  Patient Measurements: Height: 5\' 11"  (180.3 cm) Weight:  (non weight bed) IBW/kg (Calculated) : 75.3 Adjusted Body Weight:   Vital Signs: Temp: 97.6 F (36.4 C) (09/04 1410) Temp Source: Oral (09/04 1410) BP: 109/65 mmHg (09/04 1410) Pulse Rate: 88 (09/04 1410) Intake/Output from previous day: 09/03 0701 - 09/04 0700 In: 2268.3 [P.O.:720; I.V.:1448.3; IV Piggyback:100] Out: 1325 [Urine:1325] Intake/Output from this shift: Total I/O In: 240 [P.O.:240] Out: 725 [Urine:725]  Labs:  Recent Labs  04/01/15 0634 04/02/15 0703 04/03/15 0600  WBC 27.3* 24.0* 19.0*  HGB 11.2* 11.3* 10.2*  PLT 447* 428* 443*  CREATININE 4.74* 4.18* 3.40*   Estimated Creatinine Clearance: 25.8 mL/min (by C-G formula based on Cr of 3.4). No results for input(s): VANCOTROUGH, VANCOPEAK, VANCORANDOM, GENTTROUGH, GENTPEAK, GENTRANDOM, TOBRATROUGH, TOBRAPEAK, TOBRARND, AMIKACINPEAK, AMIKACINTROU, AMIKACIN in the last 72 hours.   Microbiology: Recent Results (from the past 720 hour(s))  Blood Culture (routine x 2)     Status: None (Preliminary result)   Collection Time: 03/31/15  3:10 PM  Result Value Ref Range Status   Specimen Description BLOOD RIGHT ANTECUBITAL  Final   Special Requests BOTTLES DRAWN AEROBIC AND ANAEROBIC 5CC  Final   Culture   Final    NO GROWTH 3 DAYS Performed at [facility]    Report Status PENDING  Incomplete  Blood Culture (routine x 2)     Status: None (Preliminary result)   Collection Time: 03/31/15  3:20 PM  Result Value Ref Range Status   Specimen Description BLOOD LEFT HAND  Final   Special Requests BOTTLES DRAWN AEROBIC ONLY [place]  Final   Culture   Final    NO GROWTH 3 DAYS Performed at [facility]    Report Status PENDING  Incomplete  Urine culture     Status: None   Collection Time: 03/31/15  3:52 PM  Result Value Ref Range Status    Specimen Description URINE, CATHETERIZED  Final   Special Requests NONE  Final   Culture   Final    NO GROWTH 1 DAY Performed at [facility]    Report Status 04/01/2015 FINAL  Final  MRSA PCR Screening     Status: None   Collection Time: 03/31/15  6:21 PM  Result Value Ref Range Status   MRSA by PCR NEGATIVE NEGATIVE Final    Comment:        The GeneXpert MRSA Assay (FDA approved for NASAL specimens only), is one component of a comprehensive MRSA colonization surveillance program. It is not intended to diagnose MRSA infection nor to guide or monitor treatment for MRSA infections.   C difficile quick scan w PCR reflex     Status: Abnormal   Collection Time: 04/02/15  5:57 AM  Result Value Ref Range Status   C Diff antigen POSITIVE (A) NEGATIVE Final   C Diff toxin POSITIVE (A) NEGATIVE Final   C Diff interpretation Positive for toxigenic C. difficile  Final    Comment: CRITICAL RESULT CALLED TO, READ BACK BY AND VERIFIED WITH: GARCIA,K/4W @0723  ON 04/02/15 BY KARCZEWSKI,S.     Anti-infectives    Start     Dose/Rate Route Frequency Ordered Stop   04/03/15 1445  piperacillin-tazobactam (ZOSYN) IVPB 3.375 g     3.375 g 12.5 mL/hr over 240 Minutes Intravenous 3 times per day 04/03/15 1435     04/02/15 1500  vancomycin (VANCOCIN)  IVPB 1000 mg/200 mL premix  Status:  Discontinued     1,000 mg 200 mL/hr over 60 Minutes Intravenous Every 48 hours 03/31/15 1649 04/02/15 0843   04/02/15 1000  vancomycin (VANCOCIN) 50 mg/mL oral solution 125 mg     125 mg Oral 4 times daily 04/02/15 0842 04/16/15 0959   04/01/15 0000  piperacillin-tazobactam (ZOSYN) IVPB 2.25 g  Status:  Discontinued     2.25 g 100 mL/hr over 30 Minutes Intravenous Every 8 hours 03/31/15 1649 04/03/15 1434   03/31/15 1530  vancomycin (VANCOCIN) IVPB 1000 mg/200 mL premix     1,000 mg 200 mL/hr over 60 Minutes Intravenous  Once 03/31/15 1523 03/31/15 1659   03/31/15 1530  piperacillin-tazobactam (ZOSYN)  IVPB 3.375 g     3.375 g 12.5 mL/hr over 240 Minutes Intravenous  Once 03/31/15 1523 03/31/15 1612   03/31/15 1515  cefTRIAXone (ROCEPHIN) 1 g in dextrose 5 % 50 mL IVPB     1 g Intravenous  Once 03/31/15 1503 03/31/15 1612      Assessment: Edwin [name] on D4 IV zosyn for sepsis likely secondary to UTI, also found to have Cdiff and ARF.   Anti-infectives 9/1 >> vancomycin >> 9/3 9/1 >> Zosyn >>   9/1 >> Rocephin x 1 9/3 >> PO vanc >>  Vitals/Labs WBC elevated but trending down ARF - SCr improving, CrCl now > 20  Afebrile  Cultures 9/1 urine: NGF 9/1 bldx2: NGTD 9/2 MRSA PCR: Neg 9/3 CDiff: +toxin/+antigen  Goal of Therapy:  Eradication of infection  Plan:  Change to Zosyn 3.375g IV q8h (infuse over 4 hours) for CrCl >20 ml/min F/u renal fxn, clinical course  Ralene Bathe, PharmD, BCPS 04/03/2015, 2:52 PM  Pager: [phone]

## 2015-04-03 NOTE — Evaluation (Signed)
+Physical Therapy Evaluation Patient Details Name: Edwin [name] MRN: [id number] DOB: 10-06-1955 Today's Date: 04/03/2015   History of Present Illness  pt was found at home with AMS.  He was admitted with sepsis likely from uti, acute encephalopathy acute renal failure.  He has a h/o prostate CA, foley, paranoid schizophrenia  Clinical Impression  Patient  Assisted to BR x 2 due to urgency for BM/diarrhea. . Had just been cleaned up. Will need to evaluate gait further when diarrhea is not an issue and can ambulate  Out of room. Patient will beneift from PT to address problems listed in note below.   Follow Up Recommendations SNF;Supervision/Assistance - 24 hour    Equipment Recommendations  Rolling walker with 5" wheels    Recommendations for Other Services       Precautions / Restrictions Precautions Precautions: Fall Precaution Comments: diarrhea      Mobility  Bed Mobility               General bed mobility comments: variable, noted to get up without assist then asked for assist.  Transfers Overall transfer level: Needs assistance Equipment used: Rolling walker (2 wheeled) Transfers: Sit to/from Stand Sit to Stand: Supervision         General transfer comment: cues for safety, impulsive- urgency for bathroom/diarrhea  Ambulation/Gait Ambulation/Gait assistance: Min guard Ambulation Distance (Feet): 20 Feet (x 4) Assistive device: Rolling walker (2 wheeled) Gait Pattern/deviations: Step-through pattern     General Gait Details: patient  moves quickly,   Stairs            Wheelchair Mobility    Modified Rankin (Stroke Patients Only)       Balance Overall balance assessment: Needs assistance Sitting-balance support: Feet supported;No upper extremity supported Sitting balance-Leahy Scale: Good     Standing balance support: During functional activity;No upper extremity supported Standing balance-Leahy Scale: Fair                                Pertinent Vitals/Pain Pain Assessment: No/denies pain    Home Living Family/patient expects to be discharged to:: Unsure Living Arrangements: Alone               Additional Comments: pt states that he lives alone, mother lives nearby    Prior Function Level of Independence: Independent         Comments: Pt reports he drives and grocery shops.  brother manages finances      Hand Dominance        Extremity/Trunk Assessment               Lower Extremity Assessment: Generalized weakness         Communication   Communication: No difficulties (very quiet)  Cognition Arousal/Alertness: Awake/alert Behavior During Therapy: Flat affect Overall Cognitive Status: No family/caregiver present to determine baseline cognitive functioning                 General Comments: follows commands     General Comments      Exercises        Assessment/Plan    PT Assessment Patient needs continued PT services  PT Diagnosis Difficulty walking   PT Problem List Decreased strength;Decreased activity tolerance;Decreased balance;Decreased mobility;Decreased safety awareness  PT Treatment Interventions Gait training;DME instruction;Stair training;Functional mobility training;Therapeutic activities;Therapeutic exercise;Patient/family education   PT Goals (Current goals can be found in the Care Plan section) Acute Rehab PT Goals  Patient Stated Goal: none stated PT Goal Formulation: With patient Time For Goal Achievement: 04/17/15 Potential to Achieve Goals: Good    Frequency Min 3X/week   Barriers to discharge        Co-evaluation               End of Session   Activity Tolerance: Patient tolerated treatment well Patient left: in bed;with call bell/phone within reach Nurse Communication: Mobility status         Time: 1430-1458 PT Time Calculation (min) (ACUTE ONLY): 28 min   Charges:   PT Evaluation $Initial PT Evaluation Tier  I: 1 Procedure PT Treatments $Gait Training: 8-22 mins   PT G CodesClaretha Cooper 04/03/2015, 3:13 PM  Tresa Endo PT [phone]

## 2015-04-04 ENCOUNTER — Inpatient Hospital Stay (HOSPITAL_COMMUNITY): Payer: Medicare Other

## 2015-04-04 DIAGNOSIS — A0472 Enterocolitis due to Clostridium difficile, not specified as recurrent: Secondary | ICD-10-CM

## 2015-04-04 LAB — BASIC METABOLIC PANEL
ANION GAP: 7 (ref 5–15)
BUN: 29 mg/dL — ABNORMAL HIGH (ref 6–20)
CALCIUM: 8 mg/dL — AB (ref 8.9–10.3)
CO2: 17 mmol/L — AB (ref 22–32)
CREATININE: 2.64 mg/dL — AB (ref 0.61–1.24)
Chloride: 120 mmol/L — ABNORMAL HIGH (ref 101–111)
GFR calc non Af Amer: 25 mL/min — ABNORMAL LOW (ref >60)
GFR, EST AFRICAN AMERICAN: 29 mL/min — AB (ref >60)
Glucose, Bld: 95 mg/dL (ref 65–99)
Potassium: 3.7 mmol/L (ref 3.5–5.1)
SODIUM: 144 mmol/L (ref 135–145)

## 2015-04-04 LAB — CBC
HEMATOCRIT: 33.8 % — AB (ref 39.0–52.0)
HEMOGLOBIN: 11.1 g/dL — AB (ref 13.0–17.0)
MCH: 26.2 pg (ref 26.0–34.0)
MCHC: 32.8 g/dL (ref 30.0–36.0)
MCV: 79.9 fL (ref 78.0–100.0)
Platelets: 500 10*3/uL — ABNORMAL HIGH (ref 150–400)
RBC: 4.23 MIL/uL (ref 4.22–5.81)
RDW: 16.3 % — AB (ref 11.5–15.5)
WBC: 18.2 10*3/uL — AB (ref 4.0–10.5)

## 2015-04-04 MED ORDER — FOLIC ACID 1 MG PO TABS
1.0000 mg | ORAL_TABLET | Freq: Every day | ORAL | Status: DC
  Administered 2015-04-05 – 2015-04-07 (×3): 1 mg via ORAL
  Filled 2015-04-04 (×3): qty 1

## 2015-04-04 MED ORDER — DEXTROSE-NACL 5-0.45 % IV SOLN
INTRAVENOUS | Status: DC
  Administered 2015-04-04 – 2015-04-06 (×5): via INTRAVENOUS

## 2015-04-04 MED ORDER — VITAMIN B-1 100 MG PO TABS
100.0000 mg | ORAL_TABLET | Freq: Every day | ORAL | Status: DC
  Administered 2015-04-05 – 2015-04-07 (×3): 100 mg via ORAL
  Filled 2015-04-04 (×3): qty 1

## 2015-04-04 NOTE — H&P (Signed)
[facility] PROGRESS NOTE  Edwin [name] QJJ:[id number] DOB: 08/16/1955 DOA: 03/31/2015 PCP: [facility]  Assessment/Plan: 1-Acute encephalopathy:  Patient MS improved. He is alert, oriented to place, time and situation.  Likely multifactorial, metabolic encephalopathy, infectious process.  CT head negative.  ABG no hypercapnia, salicylate negative, ammonia at 19, tylenol level negative, TSH at 0.519. -improving.   2-Acute on chronic renal failure.  Suspect related to hypovolemia, hypotension. Obstructive uropathy.  Renal US with malposition of foley. Bilateral hydronephrosis.  CK level at 500, continue with IV fluids.  Decrease rate.  Renal function improving. Cr peak at 5.5 on admission. Today at 2.6   3-C diff colitis ; patient on admission was not able to provide history due to AMS. He nows relates having diarrhea at home for 4 days.  C diff positive. Started on oral vancomycin. Day 3.  Abdomen distended. KUB with nonobstructive bowel gas pattern.  WBC trending down.  Abdomen distended, will check [place] to evaluates for ascites.   4-Hypokalemia:  Resolved.   5-Sepsis: source probably UTI secondary to indwelling foley catheter and C diff.   IV fluids, IV antibiotics. Continue with zosyn day 5.  Presents with hypotension, leukocytosis, tachycardia.  discontinue IV vancomycin.  Now with C diff. Started on oral vancomycin. If urine culture remain negative will consider discontinue Zosyn in 24 to 48 hours.   6-Metabolic acidosis; Continue with IV fluids.   7-Hyponatremia; resolved with IV fluids.   8-High grade prostatic intraepithelial Neoplasm: follow up with urology out patient.   9-Schizophrenia: Continue with  clozapine.      Code Status: Full Code.  Family Communication: Care discussed with patient.  Disposition Plan: Remain in the step down unit   Consultants: Urology : Findings are similar to the prior CT. There is significant dilation of both renal  pelvises and the intrarenal collecting systems, left greater than right. Bladder shows irregular thickened wall and dependent debris. Hydronephrosis is likely on the basis of bladder outlet obstruction. 2. Foley catheter has been inserted but does not enter the bladder.  Tip appears to be in the prostatic urethra.  Procedures:  Renal US;   Antibiotics:  Vancomycin   Zosyn  HPI/Subjective: He is alert, in no distress, relates poor appetite.  Denies abdominal pain. Diarrhea improved.     Objective: Filed Vitals:   04/04/15 1326  BP: 122/83  Pulse: 78  Temp: 98.2 F (36.8 C)  Resp: 20    Intake/Output Summary (Last 24 hours) at 04/04/15 1516 Last data filed at 04/04/15 1500  Gross per 24 hour  Intake   2660 ml  Output   3950 ml  Net  -1290 ml   Filed Weights   03/31/15 1514 03/31/15 1700 04/01/15 0300  Weight: 79.833 kg (176 lb) 80 kg (176 lb 5.9 oz) 79.5 kg (175 lb 4.3 oz)    Exam:   General: Alert in no distress.   Cardiovascular: S 1, S 2 RRR  Respiratory: CTA  Abdomen: BS present, soft, nt  Musculoskeletal: no edema  Data Reviewed: Basic Metabolic Panel:  Recent Labs Lab 03/31/15 1523  03/31/15 2230 04/01/15 0634 04/02/15 0703 04/03/15 0600 04/04/15 0455  NA 128*  < > 134* 135 139 141 144  K 2.5*  < > 2.8* 3.1* 2.9* 3.2* 3.7  CL 95*  < > 104 107 111 113* 120*  CO2 17*  --  16* 18* 20* 19* 17*  GLUCOSE 115*  < > 106* 104* 119* 103* 95  BUN  77*  < > 66* 64* 54* 40* 29*  CREATININE 5.59*  < > 5.03* 4.74* 4.18* 3.40* 2.64*  CALCIUM 7.7*  --  7.2* 7.3* 7.6* 7.8* 8.0*  MG 2.3  --   --   --   --   --   --   < > = values in this interval not displayed. Liver Function Tests:  Recent Labs Lab 03/31/15 1523 04/01/15 0634  AST 60* 75*  ALT 48 53  ALKPHOS 116 105  BILITOT 0.7 0.4  PROT 6.2* 5.4*  ALBUMIN 2.4* 2.0*   No results for input(s): LIPASE, AMYLASE in the last 168 hours.  Recent Labs Lab 03/31/15 1958  AMMONIA 19    CBC:  Recent Labs Lab 03/31/15 1523 03/31/15 1532 04/01/15 0634 04/02/15 0703 04/03/15 0600 04/04/15 0455  WBC 27.5*  --  27.3* 24.0* 19.0* 18.2*  NEUTROABS 24.4*  --   --   --   --   --   HGB 11.1* 12.2* 11.2* 11.3* 10.2* 11.1*  HCT 31.1* 36.0* 32.6* 32.9* 30.9* 33.8*  MCV 77.4*  --  77.8* 78.0 79.2 79.9  PLT 400  --  447* 428* 443* 500*   Cardiac Enzymes:  Recent Labs Lab 03/31/15 1523  CKTOTAL 512*   BNP (last 3 results) No results for input(s): BNP in the last 8760 hours.  ProBNP (last 3 results) No results for input(s): PROBNP in the last 8760 hours.  CBG: No results for input(s): GLUCAP in the last 168 hours.  Recent Results (from the past 240 hour(s))  Blood Culture (routine x 2)     Status: None (Preliminary result)   Collection Time: 03/31/15  3:10 PM  Result Value Ref Range Status   Specimen Description BLOOD RIGHT ANTECUBITAL  Final   Special Requests BOTTLES DRAWN AEROBIC AND ANAEROBIC 5CC  Final   Culture   Final    NO GROWTH 4 DAYS Performed at [facility]    Report Status PENDING  Incomplete  Blood Culture (routine x 2)     Status: None (Preliminary result)   Collection Time: 03/31/15  3:20 PM  Result Value Ref Range Status   Specimen Description BLOOD LEFT HAND  Final   Special Requests BOTTLES DRAWN AEROBIC ONLY [place]  Final   Culture   Final    NO GROWTH 4 DAYS Performed at [facility]    Report Status PENDING  Incomplete  Urine culture     Status: None   Collection Time: 03/31/15  3:52 PM  Result Value Ref Range Status   Specimen Description URINE, CATHETERIZED  Final   Special Requests NONE  Final   Culture   Final    NO GROWTH 1 DAY Performed at [facility]    Report Status 04/01/2015 FINAL  Final  MRSA PCR Screening     Status: None   Collection Time: 03/31/15  6:21 PM  Result Value Ref Range Status   MRSA by PCR NEGATIVE NEGATIVE Final    Comment:        The GeneXpert MRSA Assay (FDA approved for  NASAL specimens only), is one component of a comprehensive MRSA colonization surveillance program. It is not intended to diagnose MRSA infection nor to guide or monitor treatment for MRSA infections.   C difficile quick scan w PCR reflex     Status: Abnormal   Collection Time: 04/02/15  5:57 AM  Result Value Ref Range Status   C Diff antigen POSITIVE (A) NEGATIVE Final  C Diff toxin POSITIVE (A) NEGATIVE Final   C Diff interpretation Positive for toxigenic C. difficile  Final    Comment: CRITICAL RESULT CALLED TO, READ BACK BY AND VERIFIED WITH: GARCIA,K/4W @0723  ON 04/02/15 BY KARCZEWSKI,S.      Studies: Dg Abd 1 View  04/03/2015   CLINICAL DATA:  Abdominal pain and distention.  EXAM: ABDOMEN - 1 VIEW  COMPARISON:  02/18/2015 CT  FINDINGS: Gas in the colon again noted. A possible mildly distended loop of small bowel within the right abdomen is nonspecific.  No suspicious calcifications are identified.  No acute bony abnormalities are noted.  IMPRESSION: Nonspecific nonobstructive bowel gas pattern.   Electronically Signed   By: Margarette Canada M.D.   On: 04/03/2015 11:17    Scheduled Meds: . benztropine  0.5 mg Oral q morning - 10a  . calcium carbonate  1 tablet Oral BID WC  . clotrimazole   Topical BID  . clozapine  800 mg Oral QHS  . [START ON 01/28/1656] folic acid  1 mg Oral Daily  . heparin  5,000 Units Subcutaneous 3 times per day  . piperacillin-tazobactam (ZOSYN)  IV  3.375 g Intravenous 3 times per day  . sodium chloride  3 mL Intravenous Q12H  . tamsulosin  0.4 mg Oral QPC supper  . [START ON 04/05/2015] thiamine  100 mg Oral Daily  . vancomycin  125 mg Oral QID   Continuous Infusions: . dextrose 5 % and 0.45% NaCl 100 mL/hr at 04/04/15 0848    Active Problems:   Paranoid schizophrenia, chronic condition   AKI (acute kidney injury)   Sepsis   Acute encephalopathy    Time spent: 67 minunets    [place], [place] [facility] Pager [phone]. If 7PM-7AM,  please contact night-coverage at [email or web address], password [facility] 04/04/2015, 3:16 PM  LOS: 4 days

## 2015-04-04 NOTE — Progress Notes (Signed)
CSW received referral for New SNF.   CSW attempted to meet with pt at bedside. Pt awoke initially upon CSW entering the room, but soon fell back asleep and then would not awake enough in order for CSW to complete assessment.   CSW notified RN and RN will notify CSW if pt awakes more. RN reports that pt has support from pt brother. CSW will inquire with pt if CSW can involve pt brother once CSW able to speak with pt.   CSW to continue to follow to provide support and assist with pt disposition needs.   CSW to complete full psychosocial assessment once pt able to participate in assessment.  Alison Murray, MSW, [place] [facility] [phone]

## 2015-04-04 NOTE — Progress Notes (Signed)
Physical Therapy Treatment Patient Details Name: Edwin [name] MRN: [id number] DOB: February 20, 1956 Today's Date: 04-30-2015    History of Present Illness pt was found at home with AMS.  He was admitted with sepsis likely from uti, acute encephalopathy acute renal failure.  He has a h/o prostate CA, foley, paranoid schizophrenia    PT Comments    Pt progressing, incr gait distance today  Follow Up Recommendations  SNF;Supervision/Assistance - 24 hour (vs HHPT depending on progress/caregiver support)     Equipment Recommendations  Rolling walker with 5" wheels    Recommendations for Other Services       Precautions / Restrictions Precautions Precautions: Fall Restrictions Weight Bearing Restrictions: No    Mobility  Bed Mobility Overal bed mobility: Needs Assistance Bed Mobility: Supine to Sit     Supine to sit: Supervision;HOB elevated     General bed mobility comments: incr time, cues for participation  Transfers Overall transfer level: Needs assistance Equipment used: Rolling walker (2 wheeled) Transfers: Sit to/from Stand Sit to Stand: Min assist         General transfer comment: cues for safety, hand placement  Ambulation/Gait Ambulation/Gait assistance: Min guard Ambulation Distance (Feet): 120 Feet Assistive device: Rolling walker (2 wheeled) Gait Pattern/deviations: Step-through pattern;Decreased stride length     General Gait Details: cues for RW safety   Stairs            Wheelchair Mobility    Modified Rankin (Stroke Patients Only)       Balance     Sitting balance-Leahy Scale: Good       Standing balance-Leahy Scale: Fair                      Cognition Arousal/Alertness: Awake/alert Behavior During Therapy: Flat affect Overall Cognitive Status: No family/caregiver present to determine baseline cognitive functioning                 General Comments: follows commands     Exercises      General Comments         Pertinent Vitals/Pain Pain Assessment: No/denies pain    Home Living                      Prior Function            PT Goals (current goals can now be found in the care plan section) Acute Rehab PT Goals Patient Stated Goal: none stated PT Goal Formulation: With patient Time For Goal Achievement: 04/17/15 Potential to Achieve Goals: Good Progress towards PT goals: Progressing toward goals    Frequency  Min 3X/week    PT Plan Current plan remains appropriate    Co-evaluation             End of Session Equipment Utilized During Treatment: Gait belt Activity Tolerance: Patient tolerated treatment well Patient left: in chair;with call bell/phone within reach;with chair alarm set     Time: 1000-1020 PT Time Calculation (min) (ACUTE ONLY): 20 min  Charges:  $Gait Training: 8-22 mins                    G Codes:      Chelbi Herber 04-30-15, 1:27 PM

## 2015-04-04 NOTE — Progress Notes (Signed)
Occupational Therapy Treatment Patient Details Name: Edwin [name] MRN: [id number] DOB: [birth year] Today's Date: 04/04/2015    History of present illness pt was found at home with AMS.  He was admitted with sepsis likely from uti, acute encephalopathy acute renal failure.  He has a h/o prostate CA, foley, paranoid schizophrenia   OT comments  Very flat affect  Follow Up Recommendations  SNF              Mobility Bed Mobility               General bed mobility comments: pt in chair  Transfers Overall transfer level: Needs assistance Equipment used: Rolling walker (2 wheeled) Transfers: Sit to/from Stand Sit to Stand: Min assist         General transfer comment: cues for safety    Balance     Sitting balance-Leahy Scale: Good       Standing balance-Leahy Scale: Fair                     ADL           Upper Body Bathing: Minimal assitance;Sitting (washing hair wiith cap and combing hair)                             General ADL Comments: Very flat affect.  Max A to wash hair with cap.  Hard to hear pt speak      Vision                            Cognition   Behavior During Therapy: Flat affect Overall Cognitive Status: No family/caregiver present to determine baseline cognitive functioning                  General Comments: follows commands     Extremity/Trunk Assessment  Upper Extremity Assessment Upper Extremity Assessment: Generalized weakness                       Pertinent Vitals/ Pain       Pain Assessment: No/denies pain         Frequency       Progress Toward Goals  OT Goals(current goals can now be found in the care plan section)  Progress towards OT goals: Progressing toward goals            End of Session     Activity Tolerance Patient tolerated treatment well   Patient Left in chair;with call bell/phone within reach;with chair alarm set   Nurse Communication  Mobility status        Time: 4695-0722 OT Time Calculation (min): 14 min  Charges: OT General Charges $OT Visit: 1 Procedure OT Treatments $Self Care/Home Management : 8-22 mins  Dorna Mallet, Thereasa Parkin 04/04/2015, 12:50 PM

## 2015-04-05 LAB — CBC
HEMATOCRIT: 34.3 % — AB (ref 39.0–52.0)
HEMOGLOBIN: 11.1 g/dL — AB (ref 13.0–17.0)
MCH: 26.1 pg (ref 26.0–34.0)
MCHC: 32.4 g/dL (ref 30.0–36.0)
MCV: 80.7 fL (ref 78.0–100.0)
Platelets: 537 10*3/uL — ABNORMAL HIGH (ref 150–400)
RBC: 4.25 MIL/uL (ref 4.22–5.81)
RDW: 16.6 % — AB (ref 11.5–15.5)
WBC: 18.2 10*3/uL — ABNORMAL HIGH (ref 4.0–10.5)

## 2015-04-05 LAB — BASIC METABOLIC PANEL
Anion gap: 9 (ref 5–15)
BUN: 20 mg/dL (ref 6–20)
CHLORIDE: 113 mmol/L — AB (ref 101–111)
CO2: 21 mmol/L — AB (ref 22–32)
CREATININE: 2.5 mg/dL — AB (ref 0.61–1.24)
Calcium: 8.3 mg/dL — ABNORMAL LOW (ref 8.9–10.3)
GFR calc Af Amer: 31 mL/min — ABNORMAL LOW (ref >60)
GFR calc non Af Amer: 27 mL/min — ABNORMAL LOW (ref >60)
Glucose, Bld: 95 mg/dL (ref 65–99)
Potassium: 3.7 mmol/L (ref 3.5–5.1)
Sodium: 143 mmol/L (ref 135–145)

## 2015-04-05 LAB — CULTURE, BLOOD (ROUTINE X 2)
CULTURE: NO GROWTH
Culture: NO GROWTH

## 2015-04-05 MED ORDER — ZOLPIDEM TARTRATE 5 MG PO TABS
5.0000 mg | ORAL_TABLET | Freq: Once | ORAL | Status: AC
  Administered 2015-04-05: 5 mg via ORAL
  Filled 2015-04-05: qty 1

## 2015-04-05 MED ORDER — NICOTINE 21 MG/24HR TD PT24
21.0000 mg | MEDICATED_PATCH | Freq: Every day | TRANSDERMAL | Status: DC
  Administered 2015-04-05 – 2015-04-07 (×3): 21 mg via TRANSDERMAL
  Filled 2015-04-05 (×3): qty 1

## 2015-04-05 MED ORDER — METRONIDAZOLE IN NACL 5-0.79 MG/ML-% IV SOLN
500.0000 mg | Freq: Three times a day (TID) | INTRAVENOUS | Status: DC
  Administered 2015-04-05 – 2015-04-07 (×7): 500 mg via INTRAVENOUS
  Filled 2015-04-05 (×7): qty 100

## 2015-04-05 NOTE — Clinical Social Work Placement (Signed)
   CLINICAL SOCIAL WORK PLACEMENT  NOTE  Date:  04/05/2015  Patient Details  Name: Edwin [name] MRN: [id number] Date of Birth: [birth year]  Clinical Social Work is seeking post-discharge placement for this patient at the [place] level of care (*CSW will initial, date and re-position this form in  chart as items are completed):  Yes   Patient/family provided with [place] Work Department's list of facilities offering this level of care within the geographic area requested by the patient (or if unable, by the patient's family).  Yes   Patient/family informed of their freedom to choose among providers that offer the needed level of care, that participate in Medicare, Medicaid or managed care program needed by the patient, have an available bed and are willing to accept the patient.  Yes   Patient/family informed of [facility]'s ownership interest in [facility] and [facility], as well as of the fact that they are under no obligation to receive care at these facilities.  PASRR submitted to EDS on 04/05/15     PASRR number received on       Existing PASRR number confirmed on       FL2 transmitted to all facilities in geographic area requested by pt/family on 04/05/15     FL2 transmitted to all facilities within larger geographic area on       Patient informed that his/her managed care company has contracts with or will negotiate with certain facilities, including the following:            Patient/family informed of bed offers received.  Patient chooses bed at       Physician recommends and patient chooses bed at      Patient to be transferred to   on  .  Patient to be transferred to facility by       Patient family notified on   of transfer.  Name of family member notified:        PHYSICIAN Please sign FL2     Additional Comment:    _______________________________________________ Ladell Pier, LCSW 04/05/2015, 2:33 PM

## 2015-04-05 NOTE — Progress Notes (Addendum)
[facility] PROGRESS NOTE  DIAZ CRAGO BMW:[id number] DOB: 04-09-1956 DOA: 03/31/2015 PCP: [facility]  Assessment/Plan: Edwin [name] is a 59 y.o. male PMH of paranoid schizophrenia, Prostate cancer, foley catheter, patient was found at home with AMS. History was obtain from chart and ED physician. Patient unable to provide history. Patient missed his last 2 psychiatric appointment. Mobile crisis call EMS, patient was found with fecal matter on him, with AMS, hypotensive SBP in the 80. Patient received IV bolus.  Evaluation in the ED, patient is lethargic, Cr at 5.5, BUN 63, K level at 2.3. UA with too numerous to count WBC. Chest x ray: no edema or consolidation.  Patient was found to have UTI, C diff colitis. Acute renal failure, encephalopathy.  Patient receiving IV flagyl and oral vancomycin for C diff.   1-Acute encephalopathy:  Patient MS improved. He is alert, oriented to place, time and situation.  Likely multifactorial, metabolic encephalopathy, infectious process.  CT head negative.  ABG no hypercapnia, salicylate negative, ammonia at 19, tylenol level negative, TSH at 0.519. -improving.   2-Acute on chronic renal failure.  Suspect related to hypovolemia, hypotension. Obstructive uropathy.  Renal US with malposition of foley. Bilateral hydronephrosis.  CK level at 500, continue with IV fluids. Decrease rate.  Renal function improving. Cr peak at 5.5 on admission. Today at 2.5  Evaluated by urology who recommend to keep foley and follow up outpatient.   3-C diff colitis ; patient on admission was not able to provide history due to AMS. He nows relates having diarrhea at home for 4 days.  C diff positive. Started on oral vancomycin. Day 4.  Abdomen distended. KUB with nonobstructive bowel gas pattern.  WBC trending down.  Abdomen distended,  [place] negative for ascites.  Will add flagyl 9-06. Due to leukocytosis and abdominal distension.   4-Hypokalemia:  Resolved.   5-Sepsis: source probably UTI secondary to indwelling foley catheter and C diff.  Received  6 day of IV zosyn. discontinue IV vancomycin.  Presents with hypotension, leukocytosis, tachycardia.   C diff positive. Started on oral vancomycin. Urine culture negative. He has received 6 days of IV zosyn. Will stop IV zosyn due to C diff.   6-Metabolic acidosis; Continue with IV fluids. improving  7-Hyponatremia; resolved with IV fluids.   8-High grade prostatic intraepithelial Neoplasm: follow up with urology out patient.   9-Schizophrenia: Continue with clozapine, benztropine     Code Status: Full Code.  Family Communication: Care discussed with patient.  Disposition Plan: Awaiting improvement of renal function, decreased WBC and improvement of C diff.   Consultants:  Urology   Procedures: Renal US; Findings are similar to the prior CT. There is significant dilation of both renal pelvises and the intrarenal collecting systems, left greater than right. Bladder shows irregular thickened wall and dependent debris. Hydronephrosis is likely on the basis of bladder outlet obstruction. 2. Foley catheter has been inserted but does not enter the bladder.  Tip appears to be in the prostatic urethra.  Antibiotics:  Vancomycin stopped: 9-01  Zosyn stopped 9-06  HPI/Subjective: He is feeling ok, denies abdominal pain, diarrhea improving.    Objective: Filed Vitals:   04/05/15 1320  BP: 121/73  Pulse: 82  Temp: 98.4 F (36.9 C)  Resp: 18    Intake/Output Summary (Last 24 hours) at 04/05/15 1338 Last data filed at 04/05/15 1203  Gross per 24 hour  Intake 3092.5 ml  Output   4650 ml  Net -1557.5 ml  Filed Weights   03/31/15 1700 04/01/15 0300 04/05/15 0610  Weight: 80 kg (176 lb 5.9 oz) 79.5 kg (175 lb 4.3 oz) 80.876 kg (178 lb 4.8 oz)    Exam:   General:  NAD, slow in answering questions.   Cardiovascular: S 1, S 2 RRR  Respiratory:  CTA  Abdomen: BS present, soft, nt distended.   Musculoskeletal: no edema  Data Reviewed: Basic Metabolic Panel:  Recent Labs Lab 03/31/15 1523  04/01/15 0634 04/02/15 0703 04/03/15 0600 04/04/15 0455 04/05/15 0435  NA 128*  < > 135 139 141 144 143  K 2.5*  < > 3.1* 2.9* 3.2* 3.7 3.7  CL 95*  < > 107 111 113* 120* 113*  CO2 17*  < > 18* 20* 19* 17* 21*  GLUCOSE 115*  < > 104* 119* 103* 95 95  BUN 77*  < > 64* 54* 40* 29* 20  CREATININE 5.59*  < > 4.74* 4.18* 3.40* 2.64* 2.50*  CALCIUM 7.7*  < > 7.3* 7.6* 7.8* 8.0* 8.3*  MG 2.3  --   --   --   --   --   --   < > = values in this interval not displayed. Liver Function Tests:  Recent Labs Lab 03/31/15 1523 04/01/15 0634  AST 60* 75*  ALT 48 53  ALKPHOS 116 105  BILITOT 0.7 0.4  PROT 6.2* 5.4*  ALBUMIN 2.4* 2.0*   No results for input(s): LIPASE, AMYLASE in the last 168 hours.  Recent Labs Lab 03/31/15 1958  AMMONIA 19   CBC:  Recent Labs Lab 03/31/15 1523  04/01/15 0634 04/02/15 0703 04/03/15 0600 04/04/15 0455 04/05/15 0435  WBC 27.5*  --  27.3* 24.0* 19.0* 18.2* 18.2*  NEUTROABS 24.4*  --   --   --   --   --   --   HGB 11.1*  < > 11.2* 11.3* 10.2* 11.1* 11.1*  HCT 31.1*  < > 32.6* 32.9* 30.9* 33.8* 34.3*  MCV 77.4*  --  77.8* 78.0 79.2 79.9 80.7  PLT 400  --  447* 428* 443* 500* 537*  < > = values in this interval not displayed. Cardiac Enzymes:  Recent Labs Lab 03/31/15 1523  CKTOTAL 512*   BNP (last 3 results) No results for input(s): BNP in the last 8760 hours.  ProBNP (last 3 results) No results for input(s): PROBNP in the last 8760 hours.  CBG: No results for input(s): GLUCAP in the last 168 hours.  Recent Results (from the past 240 hour(s))  Blood Culture (routine x 2)     Status: None (Preliminary result)   Collection Time: 03/31/15  3:10 PM  Result Value Ref Range Status   Specimen Description BLOOD RIGHT ANTECUBITAL  Final   Special Requests BOTTLES DRAWN AEROBIC AND  ANAEROBIC 5CC  Final   Culture   Final    NO GROWTH 4 DAYS Performed at [facility]    Report Status PENDING  Incomplete  Blood Culture (routine x 2)     Status: None (Preliminary result)   Collection Time: 03/31/15  3:20 PM  Result Value Ref Range Status   Specimen Description BLOOD LEFT HAND  Final   Special Requests BOTTLES DRAWN AEROBIC ONLY [place]  Final   Culture   Final    NO GROWTH 4 DAYS Performed at [facility]    Report Status PENDING  Incomplete  Urine culture     Status: None   Collection Time: 03/31/15  3:52  PM  Result Value Ref Range Status   Specimen Description URINE, CATHETERIZED  Final   Special Requests NONE  Final   Culture   Final    NO GROWTH 1 DAY Performed at [facility]    Report Status 04/01/2015 FINAL  Final  MRSA PCR Screening     Status: None   Collection Time: 03/31/15  6:21 PM  Result Value Ref Range Status   MRSA by PCR NEGATIVE NEGATIVE Final    Comment:        The GeneXpert MRSA Assay (FDA approved for NASAL specimens only), is one component of a comprehensive MRSA colonization surveillance program. It is not intended to diagnose MRSA infection nor to guide or monitor treatment for MRSA infections.   C difficile quick scan w PCR reflex     Status: Abnormal   Collection Time: 04/02/15  5:57 AM  Result Value Ref Range Status   C Diff antigen POSITIVE (A) NEGATIVE Final   C Diff toxin POSITIVE (A) NEGATIVE Final   C Diff interpretation Positive for toxigenic C. difficile  Final    Comment: CRITICAL RESULT CALLED TO, READ BACK BY AND VERIFIED WITH: GARCIA,K/4W @0723  ON 04/02/15 BY KARCZEWSKI,S.      Studies: US Abdomen Limited  04/04/2015   CLINICAL DATA:  Abdominal distention.  EXAM: LIMITED ABDOMEN ULTRASOUND FOR ASCITES  TECHNIQUE: Limited ultrasound survey for ascites was performed in all four abdominal quadrants.  COMPARISON:  Renal ultrasound 03/31/2015  FINDINGS: Targeted ultrasound is performed of the 4  quadrants determine presence or absence of ascites. No significant ascites is identified. Incidental note is made of bilateral hydronephrosis, previously documented.  IMPRESSION: No significant ascites.   Electronically Signed   By: Nolon Nations M.D.   On: 04/04/2015 20:39    Scheduled Meds: . benztropine  0.5 mg Oral q morning - 10a  . calcium carbonate  1 tablet Oral BID WC  . clotrimazole   Topical BID  . clozapine  800 mg Oral QHS  . folic acid  1 mg Oral Daily  . heparin  5,000 Units Subcutaneous 3 times per day  . metronidazole  500 mg Intravenous Q8H  . nicotine  21 mg Transdermal Daily  . sodium chloride  3 mL Intravenous Q12H  . tamsulosin  0.4 mg Oral QPC supper  . thiamine  100 mg Oral Daily  . vancomycin  125 mg Oral QID   Continuous Infusions: . dextrose 5 % and 0.45% NaCl 75 mL/hr at 04/05/15 1032    Active Problems:   Paranoid schizophrenia, chronic condition   AKI (acute kidney injury)   Sepsis   Acute encephalopathy   Enteritis due to Clostridium difficile    Time spent: 35 minutes.     Niel Hummer A  [facility] Pager [phone]. If 7PM-7AM, please contact night-coverage at [email or web address], password [facility] 04/05/2015, 1:38 PM  LOS: 5 days

## 2015-04-05 NOTE — Clinical Social Work Note (Signed)
Clinical Social Work Assessment  Patient Details  Name: Edwin [name] MRN: [id number] Date of Birth: 1956-01-14  Date of referral:  04/05/15               Reason for consult:  Discharge Planning                Permission sought to share information with:  Family Supports Permission granted to share information::  Yes, Verbal Permission Granted  Name::     Torris House  Agency::     Relationship::  brother  Contact Information:  [phone]  Housing/Transportation Living arrangements for the past 2 months:  Single Family Home Source of Information:  Patient, Other (Comment Required) (brother) Patient Interpreter Needed:  None Criminal Activity/Legal Involvement Pertinent to Current Situation/Hospitalization:  No - Comment as needed Significant Relationships:  Siblings Lives with:  Self Do you feel safe going back to the place where you live?  No Need for family participation in patient care:  Yes (Comment)  Care giving concerns:  Pt admitted from home alone. PT recommending short term rehab at SNF.   Social Worker assessment / plan:  CSW received referral for New SNF.  CSW met with pt at bedside. Pt more awake today, but confused. Pt has safety sitter at bedside today. Pt answered questions by nodding head, "yes or no". Pt reports that he lives at home alone. CSW discussed recommendation for SNF and pt nodded his head "yes" regarding initiating SNF search. CSW obtained verbal permission to contact pt brother from pt as pt displaying some confusion and CSW unsure how much pt is able to comprehend discussion surrounding SNF for rehab.  CSW contacted pt brother, [name] via telephone. CSW introduced self and explained role. Pt brother confirmed that pt lives alone. Pt brother stated that pt lived with pt mother, but pt mother passed away. CSW provided support as pt brother discussed that he has noticed that pt has been slowly declining and not able to take care of himself. Pt brother  agreeable to short term rehab. Pt brother hopeful for [facility] or [facility]. Pt brother stated that even if pt was not confused that he is unsure how much pt would be able to participate in discussion about discharge plan secondary to schizophrenia. Pt brother reports that pt is followed by a psychiatrist in the community.   CSW completed FL2 and initiated SNF search to [facility]. CSW submitted pt pasarr, but will have to have MD sign 30 day note to submit to [facility] for pasarr.   CSW to follow up with pt brother regarding SNF bed offers.   CSW to continue to follow to provide support and assist with pt discharge planning needs.   Employment status:  Unemployed Nurse, adult PT Recommendations:  [place] / Referral to community resources:  [place]  Patient/Family's Response to care:  Pt alert and oriented to person only today. Pt brother supportive and actively involved in pt care. Pt brother agreeable to SNF and pt shook his head "yes" when CSW discussed SNF.   Patient/Family's Understanding of and Emotional Response to Diagnosis, Current Treatment, and Prognosis:  Pt brother displayed that he understood pt medical conditions and treatment plan. Pt brother feels short term rehab will be appropriate for pt.   Emotional Assessment Appearance:  Appears stated age Attitude/Demeanor/Rapport:  Other (Flat Affect) Affect (typically observed):  Flat Orientation:  Oriented to Self Alcohol / Substance use:  Not Applicable Psych involvement (Current and /or in the community):  No (Comment)  Discharge Needs  Concerns to be addressed:  Discharge Planning Concerns Readmission within the last 30 days:  No Current discharge risk:  Physical Impairment Barriers to Discharge:  Continued Medical Work up   Ladell Pier, [place] 04/05/2015, 2:27 PM  [phone]

## 2015-04-05 NOTE — Care Management Important Message (Signed)
Important Message  Patient Details  Name: Edwin [name] MRN: [id number] Date of Birth: [birth year]   Medicare Important Message Given:  Yes-second notification given    Purcell Mouton, RN 04/05/2015, 1:07 PM

## 2015-04-06 DIAGNOSIS — G9340 Encephalopathy, unspecified: Secondary | ICD-10-CM

## 2015-04-06 DIAGNOSIS — A047 Enterocolitis due to Clostridium difficile: Secondary | ICD-10-CM

## 2015-04-06 DIAGNOSIS — F200 Paranoid schizophrenia: Secondary | ICD-10-CM

## 2015-04-06 LAB — BASIC METABOLIC PANEL
ANION GAP: 9 (ref 5–15)
BUN: 17 mg/dL (ref 6–20)
CALCIUM: 8.1 mg/dL — AB (ref 8.9–10.3)
CO2: 23 mmol/L (ref 22–32)
Chloride: 112 mmol/L — ABNORMAL HIGH (ref 101–111)
Creatinine, Ser: 2.31 mg/dL — ABNORMAL HIGH (ref 0.61–1.24)
GFR, EST AFRICAN AMERICAN: 35 mL/min — AB (ref >60)
GFR, EST NON AFRICAN AMERICAN: 30 mL/min — AB (ref >60)
Glucose, Bld: 98 mg/dL (ref 65–99)
Potassium: 2.9 mmol/L — ABNORMAL LOW (ref 3.5–5.1)
SODIUM: 144 mmol/L (ref 135–145)

## 2015-04-06 LAB — CBC
HEMATOCRIT: 32.9 % — AB (ref 39.0–52.0)
Hemoglobin: 10.9 g/dL — ABNORMAL LOW (ref 13.0–17.0)
MCH: 26.7 pg (ref 26.0–34.0)
MCHC: 33.1 g/dL (ref 30.0–36.0)
MCV: 80.6 fL (ref 78.0–100.0)
Platelets: 516 10*3/uL — ABNORMAL HIGH (ref 150–400)
RBC: 4.08 MIL/uL — ABNORMAL LOW (ref 4.22–5.81)
RDW: 16.7 % — AB (ref 11.5–15.5)
WBC: 16.5 10*3/uL — AB (ref 4.0–10.5)

## 2015-04-06 MED ORDER — POTASSIUM CHLORIDE CRYS ER 20 MEQ PO TBCR
40.0000 meq | EXTENDED_RELEASE_TABLET | Freq: Two times a day (BID) | ORAL | Status: AC
  Administered 2015-04-06 (×2): 40 meq via ORAL
  Filled 2015-04-06 (×2): qty 2

## 2015-04-06 MED ORDER — DEXTROSE-NACL 5-0.45 % IV SOLN
INTRAVENOUS | Status: DC
  Administered 2015-04-07: 09:00:00 via INTRAVENOUS

## 2015-04-06 NOTE — Progress Notes (Signed)
PROGRESS NOTE  Edwin [name] YSA:[id number] DOB: 09/29/1955 DOA: 03/31/2015 [facility]: [facility]  HPI: 59 y.o. male PMH of paranoid schizophrenia, Prostate cancer, foley catheter, patient was found at home with AMS. History was obtain from chart and ED physician. Patient unable to provide history. Patient missed his last 2 psychiatric appointment. Mobile crisis call EMS, patient was found with fecal matter on him, with AMS, hypotensive SBP in the 80.   Subjective / 24 H Interval events No complaints, not very talkative  Assessment/Plan: Active Problems:   Paranoid schizophrenia, chronic condition   AKI (acute kidney injury)   Sepsis   Acute encephalopathy   Enteritis due to Clostridium difficile    Acute encephalopathy:  - Patient MS improved, still not fully oriented but suspect underlying cognitive problems in the setting of long standing paranoid schizophrenia - CT head negative.  - ABG no hypercapnia, salicylate negative, ammonia at 19, tylenol level negative, TSH at 0.519.  Acute on chronic renal failure.  - Suspect related to hypovolemia, hypotension and obstructive uropathy.  - Renal US with malposition of foley. Bilateral hydronephrosis.  - Renal function improving. Cr peak at 5.5 on admission and improving since - Evaluated by urology who recommend to keep foley and follow up outpatient.   C diff colitis  - patient on admission was not able to provide history due to AMS. He later endorsed diarrhea, a C diff was checked and returned positive. He was started on po Vancomcyin on 9/3 and IV Metronidazole on 9/6. - his diarrhea has resolved - WBC trending down.   Hypokalemia - needing repletion today   Sepsis due to UTI secondary to indwelling foley catheter and C diff  - Receive Ceftriaxone x 1 on 9/1, Zosyn 9/1 >> 9/6 totaling 7 days of antibiotics, discontinued on 9/6 - cultures unrevealing  Metabolic acidosis  - Continue with IV fluids.  improving  Hyponatremia - resolved with IV fluids.   High grade prostatic intraepithelial Neoplasm - follow up with urology out patient.   Schizophrenia - Continue with clozapine, benztropine    Diet: Diet renal with fluid restriction Fluid restriction:: 1200 mL Fluid; Room service appropriate?: Yes; Fluid consistency:: Thin Fluids: none  DVT Prophylaxis: heparin  Code Status: Full Code Family Communication: no family bedside  Disposition Plan: SNF when ready   Consultants:  Urology   Procedures:  None    Antibiotics Vancomycin 9/1  Ceftriaxone 9/1 Zosyn 9/1 >> 9/6  Po Vancomycin 9/3 >> IV Metronidazole 9/6 >>   Studies  US Abdomen Limited  04/04/2015   CLINICAL DATA:  Abdominal distention.  EXAM: LIMITED ABDOMEN ULTRASOUND FOR ASCITES  TECHNIQUE: Limited ultrasound survey for ascites was performed in all four abdominal quadrants.  COMPARISON:  Renal ultrasound 03/31/2015  FINDINGS: Targeted ultrasound is performed of the 4 quadrants determine presence or absence of ascites. No significant ascites is identified. Incidental note is made of bilateral hydronephrosis, previously documented.  IMPRESSION: No significant ascites.   Electronically Signed   By: Nolon Nations M.D.   On: 04/04/2015 20:39    Objective  Filed Vitals:   04/05/15 0610 04/05/15 1320 04/06/15 0410 04/06/15 1316  BP: 118/78 121/73 134/77 126/74  Pulse: 76 82 77 77  Temp: 97.3 F (36.3 C) 98.4 F (36.9 C) 98.3 F (36.8 C) 98.2 F (36.8 C)  TempSrc: Axillary Oral Oral Axillary  Resp: 18 18 20 20   Height:      Weight: 80.876 kg (178 lb 4.8 oz)  SpO2: 100% 100% 100% 100%    Intake/Output Summary (Last 24 hours) at 04/06/15 1655 Last data filed at 04/06/15 1546  Gross per 24 hour  Intake   2678 ml  Output   4725 ml  Net  -2047 ml   Filed Weights   03/31/15 1700 04/01/15 0300 04/05/15 0610  Weight: 80 kg (176 lb 5.9 oz) 79.5 kg (175 lb 4.3 oz) 80.876 kg (178 lb 4.8 oz)     Exam:  GENERAL: NAD  HEENT: head NCAT, no scleral icterus. Pupils round and reactive.   NECK: Supple.  LUNGS: Clear to auscultation. No wheezing or crackles  HEART: Regular rate and rhythm without murmur. 2+ pulses, no JVD, no peripheral edema  ABDOMEN: Soft, nontender, and nondistended. Positive bowel sounds.  EXTREMITIES: Without any cyanosis, clubbing, rash, lesions or edema.  NEUROLOGIC: non focal   PSYCHIATRIC: flat mood and affect  Data Reviewed: Basic Metabolic Panel:  Recent Labs Lab 03/31/15 1523  04/02/15 0703 04/03/15 0600 04/04/15 0455 04/05/15 0435 04/06/15 0527  NA 128*  < > 139 141 144 143 144  K 2.5*  < > 2.9* 3.2* 3.7 3.7 2.9*  CL 95*  < > 111 113* 120* 113* 112*  CO2 17*  < > 20* 19* 17* 21* 23  GLUCOSE 115*  < > 119* 103* 95 95 98  BUN 77*  < > 54* 40* 29* 20 17  CREATININE 5.59*  < > 4.18* 3.40* 2.64* 2.50* 2.31*  CALCIUM 7.7*  < > 7.6* 7.8* 8.0* 8.3* 8.1*  MG 2.3  --   --   --   --   --   --   < > = values in this interval not displayed. Liver Function Tests:  Recent Labs Lab 03/31/15 1523 04/01/15 0634  AST 60* 75*  ALT 48 53  ALKPHOS 116 105  BILITOT 0.7 0.4  PROT 6.2* 5.4*  ALBUMIN 2.4* 2.0*    Recent Labs Lab 03/31/15 1958  AMMONIA 19   CBC:  Recent Labs Lab 03/31/15 1523  04/02/15 0703 04/03/15 0600 04/04/15 0455 04/05/15 0435 04/06/15 0527  WBC 27.5*  < > 24.0* 19.0* 18.2* 18.2* 16.5*  NEUTROABS 24.4*  --   --   --   --   --   --   HGB 11.1*  < > 11.3* 10.2* 11.1* 11.1* 10.9*  HCT 31.1*  < > 32.9* 30.9* 33.8* 34.3* 32.9*  MCV 77.4*  < > 78.0 79.2 79.9 80.7 80.6  PLT 400  < > 428* 443* 500* 537* 516*  < > = values in this interval not displayed. Cardiac Enzymes:  Recent Labs Lab 03/31/15 1523  CKTOTAL 512*   Recent Results (from the past 240 hour(s))  Blood Culture (routine x 2)     Status: None   Collection Time: 03/31/15  3:10 PM  Result Value Ref Range Status   Specimen Description BLOOD RIGHT  ANTECUBITAL  Final   Special Requests BOTTLES DRAWN AEROBIC AND ANAEROBIC 5CC  Final   Culture   Final    NO GROWTH 5 DAYS Performed at [facility]    Report Status 04/05/2015 FINAL  Final  Blood Culture (routine x 2)     Status: None   Collection Time: 03/31/15  3:20 PM  Result Value Ref Range Status   Specimen Description BLOOD LEFT HAND  Final   Special Requests BOTTLES DRAWN AEROBIC ONLY [place]  Final   Culture   Final    NO GROWTH 5  DAYS Performed at [facility]    Report Status 04/05/2015 FINAL  Final  Urine culture     Status: None   Collection Time: 03/31/15  3:52 PM  Result Value Ref Range Status   Specimen Description URINE, CATHETERIZED  Final   Special Requests NONE  Final   Culture   Final    NO GROWTH 1 DAY Performed at [facility]    Report Status 04/01/2015 FINAL  Final  MRSA PCR Screening     Status: None   Collection Time: 03/31/15  6:21 PM  Result Value Ref Range Status   MRSA by PCR NEGATIVE NEGATIVE Final    Comment:        The GeneXpert MRSA Assay (FDA approved for NASAL specimens only), is one component of a comprehensive MRSA colonization surveillance program. It is not intended to diagnose MRSA infection nor to guide or monitor treatment for MRSA infections.   C difficile quick scan w PCR reflex     Status: Abnormal   Collection Time: 04/02/15  5:57 AM  Result Value Ref Range Status   C Diff antigen POSITIVE (A) NEGATIVE Final   C Diff toxin POSITIVE (A) NEGATIVE Final   C Diff interpretation Positive for toxigenic C. difficile  Final    Comment: CRITICAL RESULT CALLED TO, READ BACK BY AND VERIFIED WITH: GARCIA,K/4W @0723  ON 04/02/15 BY KARCZEWSKI,S.      Scheduled Meds: . benztropine  0.5 mg Oral q morning - 10a  . clotrimazole   Topical BID  . clozapine  800 mg Oral QHS  . folic acid  1 mg Oral Daily  . heparin  5,000 Units Subcutaneous 3 times per day  . metronidazole  500 mg Intravenous Q8H  . nicotine  21  mg Transdermal Daily  . potassium chloride  40 mEq Oral BID  . sodium chloride  3 mL Intravenous Q12H  . tamsulosin  0.4 mg Oral QPC supper  . thiamine  100 mg Oral Daily  . vancomycin  125 mg Oral QID   Continuous Infusions: . dextrose 5 % and 0.45% NaCl 75 mL/hr at 04/06/15 [place], MD [facility] Pager [phone]. If 7 PM - 7 AM, please contact night-coverage at [email or web address], password [facility] 04/06/2015, 4:55 PM  LOS: 6 days

## 2015-04-06 NOTE — Progress Notes (Addendum)
CSW continuing to follow.   CSW followed up with pt brother via telephone.   CSW provided SNF bed offers.   Pt brother is agreeable to either [facility] or [facility] whichever facility has a private room to be able to meet his needs given his current infection. [facility] would be pt brother preference.   CSW confirmed with [facility] that facility will have private room available. CSW contacted [facility] to notify of pt family interest and [facility] reviewing pt information and will notify CSW if they can offer pt a bed.  CSW to continue to follow to provide support and assist with pt disposition needs.   Addendum 3:28 pm:  CSW continuing to follow.   CSW received notification from [facility] Re[facility] that facility is able to offer pt a bed and has private room available for tomorrow.   CSW contacted pt brother, [name] and confirmed that pt brother wants [facility].   Per RN, [employer] cabin crew was d/c at 10 am this morning. CSW obtained pt pasarr.   CSW to continue to follow to assist with pt discharge to [facility] when medically ready for discharge.   Alison Murray, MSW, [place] Work [phone]

## 2015-04-06 NOTE — Progress Notes (Signed)
Nutrition Follow-up  DOCUMENTATION CODES:   Not applicable  INTERVENTION:  - Will order Magic cup BID with meals, each supplement provides 290 kcal and 9 grams of protein - RD will continue to monitor for needs  NUTRITION DIAGNOSIS:   Inadequate oral intake related to lethargy/confusion, poor appetite as evidenced by meal completion < 50%. -NEW  GOAL:   Patient will meet greater than or equal to 90% of their needs -variably met  MONITOR:   PO intake, Supplement acceptance, Weight trends, Labs, I & O's  ASSESSMENT:   Pt presents with hx paranoid schizophrenia, prostate cancer, foley catheter. Was found at home AMS, presents with Acute Encephalopathy, CR 5.5, BUN 63, K 2.3. Acute-on-Chronic Renal Failure. Pt was lethargic, poor to respond, when I spoke with him, only nodding yes or shaking his head no. Says he has lost wt, but is eating regularly. NPFA shows him as well nourished.  Right around IBW. Will monitor for changes. No intervention at this time.  Pt ate 75% breakfast 9/4, refused all meals 9/5 and consumed 10% breakfast, 75% lunch, and 40% dinner yesterday. Per rounds yesterday, pt remains confused although encephalopathy is beginning to improve.  PT had entered pt's room to work with him as RD was walking toward room. Noted that no visitors were present in pt's room.  Will order Magic Cup to supplement and monitor for tolerance/acceptance. Variably meeting needs. Medications reviewed. Labs reviewed; K: 2.9 mmol/L, Cl: 112 mmol/L, creatinine elevated but trending down, GFR: 30; no Mg or Phos labs available.    Diet Order:  Diet renal with fluid restriction Fluid restriction:: 1200 mL Fluid; Room service appropriate?: Yes; Fluid consistency:: Thin  Skin:  Wound (see comment) (Moisture Related Abrasions on  anterior left and right knees, MSAD perirectal area.)  Last BM:  9/5  Height:   Ht Readings from Last 1 Encounters:  03/31/15 _0  (1.803 m)    Weight:   Wt  Readings from Last 1 Encounters:  04/05/15 178 lb 4.8 oz (80.876 kg)    Ideal Body Weight:  78.18 kg  BMI:  Body mass index is 24.88 kg/(m^2).  Estimated Nutritional Needs:   Kcal:  2000-2200  Protein:  70-80  Fluid:  >=/ 2L  EDUCATION NEEDS:   No education needs identified at this time     Jarome Matin, RD, LDN Inpatient Clinical Dietitian Pager # [phone] After hours/weekend pager # [phone]

## 2015-04-06 NOTE — Progress Notes (Signed)
Physical Therapy Treatment Patient Details Name: Edwin [name] MRN: [id number] DOB: 1955/10/12 Today's Date: 04/06/2015    History of Present Illness Edwin [name] was found at home with AMS.  He was admitted with sepsis likely from uti, acute encephalopathy acute renal failure.  He has a h/o prostate CA, foley, paranoid schizophrenia    Edwin [name] Comments    Edwin [name] was slow to awaken.  Flat affect.  Few words.  Required MAX encouragement to participate.  Assisted OOB to amb full unit with walker then back to bed per Edwin [name] request.  Edwin [name] AxO x3 but appears withdrawn.   Follow Up Recommendations  SNF;Supervision/Assistance - 24 hour (pending progress and family support)     Equipment Recommendations  Rolling walker with 5" wheels    Recommendations for Other Services       Precautions / Restrictions Precautions Precautions: Fall Restrictions Weight Bearing Restrictions: No    Mobility  Bed Mobility Overal bed mobility: Needs Assistance Bed Mobility: Supine to Sit;Sit to Supine     Supine to sit: Supervision;HOB elevated Sit to supine: Supervision   General bed mobility comments: incr time, cues for participation  Transfers Overall transfer level: Needs assistance Equipment used: Rolling walker (2 wheeled)   Sit to Stand: Min assist         General transfer comment: cues for safety, hand placement.  Slow/sluggish  Ambulation/Gait Ambulation/Gait assistance: Min guard;Min Wellsite geologist (Feet): 245 Feet Assistive device: Rolling walker (2 wheeled) Gait Pattern/deviations: Step-through pattern;Drifts right/left;Trunk flexed;Shuffle Gait velocity: decreased   General Gait Details: very unsteady/sluggish/sloppy gait with VC's to increase upriight posture and safety negociating around obsticles.  Delayed corrective reaction time. HIGH FALL RISK   Stairs            Wheelchair Mobility    Modified Rankin (Stroke Patients Only)       Balance                                    Cognition Arousal/Alertness: Awake/alert Behavior During Therapy: Flat affect Overall Cognitive Status: No family/caregiver present to determine baseline cognitive functioning                 General Comments: follows commands     Exercises      General Comments        Pertinent Vitals/Pain Pain Assessment: No/denies pain    Home Living                      Prior Function            Edwin [name] Goals (current goals can now be found in the care plan section) Progress towards Edwin [name] goals: Progressing toward goals    Frequency  Min 3X/week    Edwin [name] Plan Current plan remains appropriate    Co-evaluation             End of Session Equipment Utilized During Treatment: Gait belt Activity Tolerance: Patient tolerated treatment well Patient left: in bed;with call bell/phone within reach;with bed alarm set     Time: 1115-1140 Edwin [name] Time Calculation (min) (ACUTE ONLY): 25 min  Charges:  $Gait Training: 8-22 mins $Therapeutic Activity: 8-22 mins                    G Codes:      Rica Koyanagi  PTA [facility]  Acute  Rehab Pager  [phone]  

## 2015-04-06 NOTE — Clinical Social Work Placement (Signed)
   CLINICAL SOCIAL WORK PLACEMENT  NOTE  Date:  04/06/2015  Patient Details  Name: GOMER FRANCE MRN: [id number] Date of Birth: Mar 20, 1956  Clinical Social Work is seeking post-discharge placement for this patient at the [place] level of care (*CSW will initial, date and re-position this form in  chart as items are completed):  Yes   Patient/family provided with [place] Work Department's list of facilities offering this level of care within the geographic area requested by the patient (or if unable, by the patient's family).  Yes   Patient/family informed of their freedom to choose among providers that offer the needed level of care, that participate in Medicare, Medicaid or managed care program needed by the patient, have an available bed and are willing to accept the patient.  Yes   Patient/family informed of [place]'s ownership interest in [facility] and [facility], as well as of the fact that they are under no obligation to receive care at these facilities.  PASRR submitted to EDS on 04/05/15     PASRR number received on 04/06/15     Existing PASRR number confirmed on       FL2 transmitted to all facilities in geographic area requested by pt/family on 04/05/15     FL2 transmitted to all facilities within larger geographic area on       Patient informed that his/her managed care company has contracts with or will negotiate with certain facilities, including the following:        Yes   Patient/family informed of bed offers received.  Patient chooses bed at [facility]     Physician recommends and patient chooses bed at      Patient to be transferred to   on  .  Patient to be transferred to facility by       Patient family notified on   of transfer.  Name of family member notified:        PHYSICIAN Please sign FL2     Additional Comment:    _______________________________________________ Ladell Pier, LCSW 04/06/2015, 3:30 PM

## 2015-04-07 DIAGNOSIS — N179 Acute kidney failure, unspecified: Secondary | ICD-10-CM

## 2015-04-07 LAB — BASIC METABOLIC PANEL
Anion gap: 8 (ref 5–15)
BUN: 14 mg/dL (ref 6–20)
CALCIUM: 8.1 mg/dL — AB (ref 8.9–10.3)
CHLORIDE: 113 mmol/L — AB (ref 101–111)
CO2: 24 mmol/L (ref 22–32)
CREATININE: 1.98 mg/dL — AB (ref 0.61–1.24)
GFR calc Af Amer: 42 mL/min — ABNORMAL LOW (ref >60)
GFR calc non Af Amer: 36 mL/min — ABNORMAL LOW (ref >60)
Glucose, Bld: 97 mg/dL (ref 65–99)
Potassium: 3 mmol/L — ABNORMAL LOW (ref 3.5–5.1)
SODIUM: 145 mmol/L (ref 135–145)

## 2015-04-07 LAB — CBC
HCT: 34.2 % — ABNORMAL LOW (ref 39.0–52.0)
HEMOGLOBIN: 11.3 g/dL — AB (ref 13.0–17.0)
MCH: 26.5 pg (ref 26.0–34.0)
MCHC: 33 g/dL (ref 30.0–36.0)
MCV: 80.1 fL (ref 78.0–100.0)
Platelets: 537 10*3/uL — ABNORMAL HIGH (ref 150–400)
RBC: 4.27 MIL/uL (ref 4.22–5.81)
RDW: 16.9 % — AB (ref 11.5–15.5)
WBC: 13.6 10*3/uL — ABNORMAL HIGH (ref 4.0–10.5)

## 2015-04-07 MED ORDER — POTASSIUM CHLORIDE 20 MEQ PO PACK: 40.0000 meq | PACK | ORAL | Status: DC

## 2015-04-07 MED ORDER — VANCOMYCIN 50 MG/ML ORAL SOLUTION: 125.0000 mg | Freq: Four times a day (QID) | ORAL | Status: DC

## 2015-04-07 MED ORDER — POTASSIUM CHLORIDE CRYS ER 20 MEQ PO TBCR
40.0000 meq | EXTENDED_RELEASE_TABLET | ORAL | Status: AC
  Administered 2015-04-07 (×2): 40 meq via ORAL
  Filled 2015-04-07 (×2): qty 2

## 2015-04-07 NOTE — Discharge Summary (Signed)
Physician Discharge Summary  Edwin [name]:[id number] DOB: November 07, 1955 DOA: 03/31/2015  PCP: [facility]  Admit date: 03/31/2015 Discharge date: 04/07/2015  Time spent: > 30 minutes  Recommendations for Outpatient Follow-up:  1. Follow up with Dr. Janice Norrie in 1 week 2. Follow up with SNF MD in 1 week 3. Recommend repeat BMP in 2-3 days for Cr and K level 4. Continue po Vancomycin for 9 additional days   Discharge Diagnoses:  Active Problems:   Paranoid schizophrenia, chronic condition   AKI (acute kidney injury)   Sepsis   Acute encephalopathy   Enteritis due to Clostridium difficile  Discharge Condition: stable  Diet recommendation: regular  Filed Weights   04/01/15 0300 04/05/15 0610 04/07/15 0427  Weight: 79.5 kg (175 lb 4.3 oz) 80.876 kg (178 lb 4.8 oz) 80.4 kg (177 lb 4 oz)   History of present illness:  Edwin [name] is a 59 y.o. male PMH of paranoid schizophrenia, Prostate cancer, foley catheter, patient was found at home with AMS. History was obtain from chart and ED physician. Patient unable to provide history. Patient missed his last 2 psychiatric appointment. Mobile crisis call EMS, patient was found with fecal matter on him, with AMS, hypotensive SBP in the 80. Patient received IV bolus.  Evaluation in the ED, patient is lethargic, Cr at 5.5, BUN 63, K level at 2.3. UA with too numerous to count WBC. Chest x ray: no edema or consolidation.   Hospital Course:  Acute encephalopathy - Patient MS improved, still not fully oriented but suspect underlying cognitive problems in the setting of long standing paranoid schizophrenia, CT head negative. ABG no hypercapnia, salicylate negative, ammonia at 19, tylenol level negative, TSH at 0.519. Suspect will continue to improve with treatment of his C diff infection Acute on chronic renal failure. - Suspect related to hypovolemia, hypotension and obstructive uropathy, Renal US with malposition of foley and bilateral  hydronephrosis. Evaluated by urology while hospitalized who recommended to keep foley and follow up outpatient. His renal function is continuing to improve, please check a repeat BMP in 2-3 days C diff colitis - patient on admission was not able to provide history due to AMS. He later endorsed diarrhea, a C diff was checked and returned positive. He was started on po Vancomcyin on 9/3, his diarrhea has resolved, he is able to eat without abdominal pain, nausea or vomiting. He is to continue po Vancomyin for 9 additional days to complete a 14 day course. His leukocytosis is trending down.  Hypokalemia - needing repletion, repeat a BMP in 2-3 days.  Sepsis due to UTI secondary to indwelling foley catheter and C diff - he received Ceftriaxone x 1 on 9/1, Zosyn 9/1 >> 9/6 totaling 7 days of antibiotics, discontinued on 9/6, his cultures have remained unrevealing Metabolic acidosis - in the setting of renal failure, has resolved.  Hyponatremia - resolved with IV fluids.  High grade prostatic intraepithelial Neoplasm - follow up with urology out patient, with Dr. Janice Norrie in 1 week Schizophrenia - Continue with clozapine, benztropine. Outpatient psychiatric follow up  Procedures:  None    Consultations:  Urology   Discharge Exam: Filed Vitals:   04/06/15 1316 04/06/15 2200 04/07/15 0427 04/07/15 0953  BP: 126/74 124/76 130/74 122/72  Pulse: 77 79 80 80  Temp: 98.2 F (36.8 C) 98.3 F (36.8 C) 98.5 F (36.9 C) 98.2 F (36.8 C)  TempSrc: Axillary Axillary Oral Oral  Resp: 20 20 21    Height:  Weight:   80.4 kg (177 lb 4 oz)   SpO2: 100% 100% 100% 100%    General: NAD Cardiovascular: RRR Respiratory: CTA biL  Discharge Instructions     Medication List    TAKE these medications        acetaminophen 500 MG tablet  Commonly known as:  TYLENOL  Take 1,000 mg by mouth every 4 (four) hours as needed for moderate pain or headache.     benztropine 0.5 MG tablet  Commonly known  as:  COGENTIN  Take 0.5 mg by mouth every morning.     clozapine 200 MG tablet  Commonly known as:  CLOZARIL  Take 800 mg by mouth at bedtime.     simvastatin 20 MG tablet  Commonly known as:  ZOCOR  Take 20 mg by mouth daily.     tamsulosin 0.4 MG Caps capsule  Commonly known as:  FLOMAX  Take 1 capsule (0.4 mg total) by mouth daily after supper.     vancomycin 50 mg/mL oral solution  Commonly known as:  VANCOCIN  Take 2.5 mLs (125 mg total) by mouth 4 (four) times daily. For 9 additional days           Follow-up Information    Follow up with Arvil Persons, MD. Schedule an appointment as soon as possible for a visit in 1 week.   Specialty:  Urology   Contact information:   [place] [address] [phone]       The results of significant diagnostics from this hospitalization (including imaging, microbiology, ancillary and laboratory) are listed below for reference.    Significant Diagnostic Studies: Dg Abd 1 View  04/03/2015   CLINICAL DATA:  Abdominal pain and distention.  EXAM: ABDOMEN - 1 VIEW  COMPARISON:  02/18/2015 CT  FINDINGS: Gas in the colon again noted. A possible mildly distended loop of small bowel within the right abdomen is nonspecific.  No suspicious calcifications are identified.  No acute bony abnormalities are noted.  IMPRESSION: Nonspecific nonobstructive bowel gas pattern.   Electronically Signed   By: Margarette Canada M.D.   On: 04/03/2015 11:17   Ct Head Wo Contrast  03/31/2015   CLINICAL DATA:  Altered mental status and hypotension.  EXAM: CT HEAD WITHOUT CONTRAST  TECHNIQUE: Contiguous axial images were obtained from the base of the skull through the vertex without intravenous contrast.  COMPARISON:  Head CT scan 01/13/2015.  FINDINGS: The brain appears normal without hemorrhage, infarct, mass lesion, mass effect, midline shift or abnormal extra-axial fluid collection. No hydrocephalus or pneumocephalus. Imaged paranasal sinuses and mastoid  air cells are clear. The calvarium is intact. Tiny locules of gas in soft tissues of the upper neck in right base are likely related to venous gas access.  IMPRESSION: No acute abnormality.  Negative exam.   Electronically Signed   By: Inge Rise M.D.   On: 03/31/2015 17:12   US Abdomen Limited  04/04/2015   CLINICAL DATA:  Abdominal distention.  EXAM: LIMITED ABDOMEN ULTRASOUND FOR ASCITES  TECHNIQUE: Limited ultrasound survey for ascites was performed in all four abdominal quadrants.  COMPARISON:  Renal ultrasound 03/31/2015  FINDINGS: Targeted ultrasound is performed of the 4 quadrants determine presence or absence of ascites. No significant ascites is identified. Incidental note is made of bilateral hydronephrosis, previously documented.  IMPRESSION: No significant ascites.   Electronically Signed   By: Nolon Nations M.D.   On: 04/04/2015 20:39   US Renal Port  03/31/2015   CLINICAL DATA:  Renal failure.  EXAM: RENAL / URINARY TRACT ULTRASOUND COMPLETE  COMPARISON:  CT, 02/18/2015  FINDINGS: Right Kidney:  Length: 16 cm. Renal sinus cysts versus, more likely, moderate hydronephrosis. The renal pelvis was not extrarenal pelvis is significantly dilated. No solid renal masses. No stones.  Left Kidney:  Length: 16.5 cm. Marked hydronephrosis, particularly distention of the renal pelvis. No solid renal masses.  Bladder:  Wall is thickened and irregular. There is dependent debris. No discrete bladder mass. Bladder is distended. There is an inserted falling, but the balloon is not evident in the bladder. It appears to reside in the prostatic urethra.  Prostate is enlarged measuring 6.7 x 6.1 x 6.3 cm.  IMPRESSION: 1. Findings are similar to the prior CT. There is significant dilation of both renal pelvises and the intrarenal collecting systems, left greater than right. Bladder shows irregular thickened wall and dependent debris. Hydronephrosis is likely on the basis of bladder outlet obstruction. 2. Foley  catheter has been inserted but does not enter the bladder. Tip appears to be in the prostatic urethra.   Electronically Signed   By: Lajean Manes M.D.   On: 03/31/2015 20:14   Dg Chest Port 1 View  03/31/2015   CLINICAL DATA:  Weakness with altered mental status  EXAM: PORTABLE CHEST - 1 VIEW  COMPARISON:  January 13, 2015  FINDINGS: There is no edema or consolidation. The heart size and pulmonary vascularity normal. No adenopathy. There is postoperative change in the lower cervical spine.  IMPRESSION: No edema or consolidation.   Electronically Signed   By: Lowella Grip III M.D.   On: 03/31/2015 15:46    Microbiology: Recent Results (from the past 240 hour(s))  Blood Culture (routine x 2)     Status: None   Collection Time: 03/31/15  3:10 PM  Result Value Ref Range Status   Specimen Description BLOOD RIGHT ANTECUBITAL  Final   Special Requests BOTTLES DRAWN AEROBIC AND ANAEROBIC 5CC  Final   Culture   Final    NO GROWTH 5 DAYS Performed at [facility]    Report Status 04/05/2015 FINAL  Final  Blood Culture (routine x 2)     Status: None   Collection Time: 03/31/15  3:20 PM  Result Value Ref Range Status   Specimen Description BLOOD LEFT HAND  Final   Special Requests BOTTLES DRAWN AEROBIC ONLY [place]  Final   Culture   Final    NO GROWTH 5 DAYS Performed at [facility]    Report Status 04/05/2015 FINAL  Final  Urine culture     Status: None   Collection Time: 03/31/15  3:52 PM  Result Value Ref Range Status   Specimen Description URINE, CATHETERIZED  Final   Special Requests NONE  Final   Culture   Final    NO GROWTH 1 DAY Performed at [facility]    Report Status 04/01/2015 FINAL  Final  MRSA PCR Screening     Status: None   Collection Time: 03/31/15  6:21 PM  Result Value Ref Range Status   MRSA by PCR NEGATIVE NEGATIVE Final    Comment:        The GeneXpert MRSA Assay (FDA approved for NASAL specimens only), is one component of a comprehensive  MRSA colonization surveillance program. It is not intended to diagnose MRSA infection nor to guide or monitor treatment for MRSA infections.   C difficile quick scan w PCR reflex  Status: Abnormal   Collection Time: 04/02/15  5:57 AM  Result Value Ref Range Status   C Diff antigen POSITIVE (A) NEGATIVE Final   C Diff toxin POSITIVE (A) NEGATIVE Final   C Diff interpretation Positive for toxigenic C. difficile  Final    Comment: CRITICAL RESULT CALLED TO, READ BACK BY AND VERIFIED WITH: GARCIA,K/4W @0723  ON 04/02/15 BY KARCZEWSKI,S.      Labs: Basic Metabolic Panel:  Recent Labs Lab 03/31/15 1523 03/31/15 1532 03/31/15 2230 04/01/15 0634 04/02/15 0703 04/03/15 0600 04/04/15 0455 04/05/15 0435 04/06/15 0527 04/07/15 0423  NA 128* 127* 134* 135 139 141 144 143 144 145  K 2.5* 2.3* 2.8* 3.1* 2.9* 3.2* 3.7 3.7 2.9* 3.0*  CL 95* 95* 104 107 111 113* 120* 113* 112* 113*  CO2 17*  --  16* 18* 20* 19* 17* 21* 23 24  GLUCOSE 115* 118* 106* 104* 119* 103* 95 95 98 97  BUN 77* 63* 66* 64* 54* 40* 29* 20 17 14   CREATININE 5.59* 5.40* 5.03* 4.74* 4.18* 3.40* 2.64* 2.50* 2.31* 1.98*  CALCIUM 7.7*  --  7.2* 7.3* 7.6* 7.8* 8.0* 8.3* 8.1* 8.1*  MG 2.3  --   --   --   --   --   --   --   --   --    Liver Function Tests:  Recent Labs Lab 03/31/15 1523 04/01/15 0634  AST 60* 75*  ALT 48 53  ALKPHOS 116 105  BILITOT 0.7 0.4  PROT 6.2* 5.4*  ALBUMIN 2.4* 2.0*    Recent Labs Lab 03/31/15 1958  AMMONIA 19   CBC:  Recent Labs Lab 03/31/15 1523  04/03/15 0600 04/04/15 0455 04/05/15 0435 04/06/15 0527 04/07/15 0423  WBC 27.5*  < > 19.0* 18.2* 18.2* 16.5* 13.6*  NEUTROABS 24.4*  --   --   --   --   --   --   HGB 11.1*  < > 10.2* 11.1* 11.1* 10.9* 11.3*  HCT 31.1*  < > 30.9* 33.8* 34.3* 32.9* 34.2*  MCV 77.4*  < > 79.2 79.9 80.7 80.6 80.1  PLT 400  < > 443* 500* 537* 516* 537*  < > = values in this interval not displayed. Cardiac Enzymes:  Recent Labs Lab  03/31/15 1523  CKTOTAL 512*    Signed:  Travontae Freiberger  [facility] 04/07/2015, 10:04 AM

## 2015-04-07 NOTE — Progress Notes (Signed)
Physical Therapy Treatment Patient Details Name: Edwin [name] MRN: [id number] DOB: 08/16/1955 Today's Date: 04/07/2015    History of Present Illness pt was found at home with AMS.  He was admitted with sepsis likely from uti, acute encephalopathy acute renal failure.  He has a h/o prostate CA, foley, paranoid schizophrenia    PT Comments    Pt more interactive but still very flat.  Assisted OOB to amb to sink.  Static standing to brush teeth.  X 1 posterior LOB with poor self coorective reaction in which therapist recovered.  Assisted with amb in hallway requiring a RW due to unstable gait.  Poor balance.  HIGH FALL RISK.  Pt will need ST Rehab to regain prior safe mobility.   Follow Up Recommendations  SNF     Equipment Recommendations       Recommendations for Other Services       Precautions / Restrictions Precautions Precautions: Fall Restrictions Weight Bearing Restrictions: No    Mobility  Bed Mobility Overal bed mobility: Needs Assistance Bed Mobility: Supine to Sit     Supine to sit: Supervision     General bed mobility comments: incr time, cues for participation  Transfers Overall transfer level: Needs assistance Equipment used: Rolling walker (2 wheeled) Transfers: Sit to/from Stand Sit to Stand: Min assist         General transfer comment: cues for safety, hand placement.  Slow/sluggish  Ambulation/Gait Ambulation/Gait assistance: Min guard;Min Wellsite geologist (Feet): 250 Feet Assistive device: Rolling walker (2 wheeled) Gait Pattern/deviations: Step-to pattern;Step-through pattern;Drifts right/left;Trunk flexed;Narrow base of support;Shuffle;Decreased stride length;Decreased step length - right;Decreased step length - left Gait velocity: decreased   General Gait Details: very unsteady/sluggish/sloppy gait with VC's to increase upriight posture and safety negociating around obsticles.  Delayed corrective reaction time. HIGH FALL  RISK   Stairs            Wheelchair Mobility    Modified Rankin (Stroke Patients Only)       Balance                                    Cognition Arousal/Alertness: Awake/alert Behavior During Therapy: Flat affect Overall Cognitive Status: No family/caregiver present to determine baseline cognitive functioning                 General Comments: follows commands / aware of his situation    Exercises      General Comments        Pertinent Vitals/Pain Pain Assessment: No/denies pain    Home Living                      Prior Function            PT Goals (current goals can now be found in the care plan section) Progress towards PT goals: Progressing toward goals    Frequency  Min 3X/week    PT Plan Current plan remains appropriate    Co-evaluation             End of Session Equipment Utilized During Treatment: Gait belt Activity Tolerance: Patient tolerated treatment well Patient left: in chair;with call bell/phone within reach;with chair alarm set     Time: 1010-1025 PT Time Calculation (min) (ACUTE ONLY): 15 min  Charges:  $Gait Training: 8-22 mins  G Codes:      Rica Koyanagi  PTA [facility]  Acute  Rehab Pager      [phone]

## 2015-04-07 NOTE — Progress Notes (Signed)
Pt DC and transferred to SNF. Telephone report called to receiving nurse. Pt was escorted out via [place] by RN.

## 2015-04-07 NOTE — Progress Notes (Signed)
Occupational Therapy Treatment Patient Details Name: Edwin [name] MRN: [id number] DOB: 03/04/1956 Today's Date: 04/07/2015    History of present illness pt was found at home with AMS.  He was admitted with sepsis likely from uti, acute encephalopathy acute renal failure.  He has a h/o prostate CA, foley, paranoid schizophrenia   OT comments  Pt participative with OT  Follow Up Recommendations  SNF          Precautions / Restrictions Precautions Precautions: Fall Restrictions Weight Bearing Restrictions: No       Mobility Bed Mobility Overal bed mobility: Needs Assistance Bed Mobility: Supine to Sit     Supine to sit: Supervision     General bed mobility comments: incr time, cues for participation  Transfers Overall transfer level: Needs assistance Equipment used: Rolling walker (2 wheeled) Transfers: Sit to/from Stand Sit to Stand: Min guard         General transfer comment: cues for safety, hand placement.  Slow/sluggish        ADL       Grooming: Min guard;Wash/dry hands;Standing                   Armed forces technical officer: Minimal assistance;Ambulation;Comfort height toilet;Grab bars   Toileting- Clothing Manipulation and Hygiene: Minimal assistance;Sit to/from stand         General ADL Comments: flat affect,.  soft spoken. had pt call and order lunch. MAX VC to have pt speak up                Cognition   Behavior During Therapy: Flat affect Overall Cognitive Status: No family/caregiver present to determine baseline cognitive functioning                  General Comments: follows commands / aware of his situation    Extremity/Trunk Assessment                          Pertinent Vitals/ Pain       Pain Assessment: No/denies pain         Frequency Min 2X/week     Progress Toward Goals  OT Goals(current goals can now be found in the care plan section)  Progress towards OT goals: Progressing toward goals             End of Session      Activity Tolerance Patient tolerated treatment well   Patient Left in chair;with call bell/phone within reach;with chair alarm set   Nurse Communication Mobility status        Time: 1243-1310 OT Time Calculation (min): 27 min  Charges: OT General Charges $OT Visit: 1 Procedure OT Evaluation $Initial OT Evaluation Tier I: 1 Procedure OT Treatments $Self Care/Home Management : 8-22 mins  Henrik Orihuela, Edwena Felty D 04/07/2015, 1:17 PM

## 2015-04-07 NOTE — Progress Notes (Signed)
Pt for discharge to [facility].  CSW facilitated pt discharge needs including contacting facility, faxing pt discharge information via [facility], discussing with pt and pt brother at bedside, providing RN phone number to call report, and providing discharge packet to pt brother to provide to [facility] upon pt arrival. Pt brother plans to transport via private vehicle.  No further social work needs identified at this time.  CSW signing off.   Alison Murray, MSW, [place] [facility] [phone]

## 2015-04-07 NOTE — Clinical Social Work Placement (Signed)
   CLINICAL SOCIAL WORK PLACEMENT  NOTE  Date:  04/07/2015  Patient Details  Name: Edwin [name] MRN: [id number] Date of Birth: 09/30/1955  Clinical Social Work is seeking post-discharge placement for this patient at the [place] level of care (*CSW will initial, date and re-position this form in  chart as items are completed):  Yes   Patient/family provided with [place] Work Department's list of facilities offering this level of care within the geographic area requested by the patient (or if unable, by the patient's family).  Yes   Patient/family informed of their freedom to choose among providers that offer the needed level of care, that participate in Medicare, Medicaid or managed care program needed by the patient, have an available bed and are willing to accept the patient.  Yes   Patient/family informed of [facility]'s ownership interest in [facility] and [facility], as well as of the fact that they are under no obligation to receive care at these facilities.  PASRR submitted to EDS on 04/05/15     PASRR number received on 04/06/15     Existing PASRR number confirmed on       FL2 transmitted to all facilities in geographic area requested by pt/family on 04/05/15     FL2 transmitted to all facilities within larger geographic area on       Patient informed that his/her managed care company has contracts with or will negotiate with certain facilities, including the following:        Yes   Patient/family informed of bed offers received.  Patient chooses bed at [facility]     Physician recommends and patient chooses bed at      Patient to be transferred to [facility] on 04/07/15.  Patient to be transferred to facility by pt brother via private vehicle     Patient family notified on 04/07/15 of transfer.  Name of family member notified:  pt and pt brother notified at bedside      PHYSICIAN Please sign FL2     Additional Comment:    _______________________________________________ Ladell Pier, LCSW 04/07/2015, 3:21 PM

## 2015-04-14 ENCOUNTER — Encounter (HOSPITAL_COMMUNITY): Payer: Self-pay

## 2015-04-14 ENCOUNTER — Emergency Department (HOSPITAL_COMMUNITY): Payer: Medicare Other

## 2015-04-14 ENCOUNTER — Inpatient Hospital Stay (HOSPITAL_COMMUNITY)
Admission: EM | Admit: 2015-04-14 | Discharge: 2015-04-18 | DRG: 871 | Disposition: A | Discharge status: Skilled Nursing Facility | Payer: Medicare Other | Attending: Internal Medicine | Admitting: Internal Medicine

## 2015-04-14 DIAGNOSIS — N189 Chronic kidney disease, unspecified: Secondary | ICD-10-CM | POA: Diagnosis present

## 2015-04-14 DIAGNOSIS — A047 Enterocolitis due to Clostridium difficile: Secondary | ICD-10-CM | POA: Diagnosis present

## 2015-04-14 DIAGNOSIS — N390 Urinary tract infection, site not specified: Secondary | ICD-10-CM | POA: Diagnosis present

## 2015-04-14 DIAGNOSIS — G9340 Encephalopathy, unspecified: Secondary | ICD-10-CM | POA: Diagnosis present

## 2015-04-14 DIAGNOSIS — Z8546 Personal history of malignant neoplasm of prostate: Secondary | ICD-10-CM | POA: Diagnosis not present

## 2015-04-14 DIAGNOSIS — I959 Hypotension, unspecified: Secondary | ICD-10-CM | POA: Diagnosis present

## 2015-04-14 DIAGNOSIS — R531 Weakness: Secondary | ICD-10-CM | POA: Diagnosis present

## 2015-04-14 DIAGNOSIS — A419 Sepsis, unspecified organism: Secondary | ICD-10-CM | POA: Diagnosis present

## 2015-04-14 DIAGNOSIS — N179 Acute kidney failure, unspecified: Secondary | ICD-10-CM | POA: Diagnosis not present

## 2015-04-14 DIAGNOSIS — F17210 Nicotine dependence, cigarettes, uncomplicated: Secondary | ICD-10-CM | POA: Diagnosis present

## 2015-04-14 DIAGNOSIS — F200 Paranoid schizophrenia: Secondary | ICD-10-CM | POA: Diagnosis present

## 2015-04-14 DIAGNOSIS — A0472 Enterocolitis due to Clostridium difficile, not specified as recurrent: Secondary | ICD-10-CM | POA: Diagnosis present

## 2015-04-14 LAB — COMPREHENSIVE METABOLIC PANEL
ALBUMIN: 3.3 g/dL — AB (ref 3.5–5.0)
ALT: 15 U/L — AB (ref 17–63)
AST: 18 U/L (ref 15–41)
Alkaline Phosphatase: 94 U/L (ref 38–126)
Anion gap: 11 (ref 5–15)
BILIRUBIN TOTAL: 0.6 mg/dL (ref 0.3–1.2)
BUN: 23 mg/dL — AB (ref 6–20)
CO2: 25 mmol/L (ref 22–32)
CREATININE: 2.56 mg/dL — AB (ref 0.61–1.24)
Calcium: 9.2 mg/dL (ref 8.9–10.3)
Chloride: 101 mmol/L (ref 101–111)
GFR calc Af Amer: 31 mL/min — ABNORMAL LOW (ref >60)
GFR calc non Af Amer: 26 mL/min — ABNORMAL LOW (ref >60)
GLUCOSE: 126 mg/dL — AB (ref 65–99)
POTASSIUM: 4.5 mmol/L (ref 3.5–5.1)
Sodium: 137 mmol/L (ref 135–145)
TOTAL PROTEIN: 8.1 g/dL (ref 6.5–8.1)

## 2015-04-14 LAB — URINALYSIS, ROUTINE W REFLEX MICROSCOPIC
BILIRUBIN URINE: NEGATIVE
Bilirubin Urine: NEGATIVE
GLUCOSE, UA: NEGATIVE mg/dL
GLUCOSE, UA: NEGATIVE mg/dL
KETONES UR: NEGATIVE mg/dL
Ketones, ur: NEGATIVE mg/dL
Nitrite: POSITIVE — AB
Nitrite: NEGATIVE
PH: 6.5 (ref 5.0–8.0)
PROTEIN: 30 mg/dL — AB
Protein, ur: 30 mg/dL — AB
SPECIFIC GRAVITY, URINE: 1.004 — AB (ref 1.005–1.030)
Specific Gravity, Urine: 1.005 (ref 1.005–1.030)
Urobilinogen, UA: 0.2 mg/dL (ref 0.0–1.0)
Urobilinogen, UA: 0.2 mg/dL (ref 0.0–1.0)
pH: 6.5 (ref 5.0–8.0)

## 2015-04-14 LAB — URINE MICROSCOPIC-ADD ON

## 2015-04-14 LAB — CBC WITH DIFFERENTIAL/PLATELET
BASOS ABS: 0 10*3/uL (ref 0.0–0.1)
Basophils Relative: 0 %
EOS ABS: 0.1 10*3/uL (ref 0.0–0.7)
EOS PCT: 0 %
HCT: 37.7 % — ABNORMAL LOW (ref 39.0–52.0)
Hemoglobin: 12 g/dL — ABNORMAL LOW (ref 13.0–17.0)
LYMPHS ABS: 0.6 10*3/uL — AB (ref 0.7–4.0)
LYMPHS PCT: 4 %
MCH: 26.4 pg (ref 26.0–34.0)
MCHC: 31.8 g/dL (ref 30.0–36.0)
MCV: 82.9 fL (ref 78.0–100.0)
MONO ABS: 1.2 10*3/uL — AB (ref 0.1–1.0)
Monocytes Relative: 7 %
Neutro Abs: 15.3 10*3/uL — ABNORMAL HIGH (ref 1.7–7.7)
Neutrophils Relative %: 89 %
PLATELETS: 327 10*3/uL (ref 150–400)
RBC: 4.55 MIL/uL (ref 4.22–5.81)
RDW: 17.3 % — AB (ref 11.5–15.5)
WBC: 17.2 10*3/uL — ABNORMAL HIGH (ref 4.0–10.5)

## 2015-04-14 LAB — LACTIC ACID, PLASMA
LACTIC ACID, VENOUS: 1.9 mmol/L (ref 0.5–2.0)
LACTIC ACID, VENOUS: 1.6 mmol/L (ref 0.5–2.0)

## 2015-04-14 LAB — APTT: aPTT: 30 seconds (ref 24–37)

## 2015-04-14 LAB — PROCALCITONIN: PROCALCITONIN: 0.4 ng/mL

## 2015-04-14 LAB — I-STAT CG4 LACTIC ACID, ED: Lactic Acid, Venous: 2.06 mmol/L (ref 0.5–2.0)

## 2015-04-14 LAB — PROTIME-INR
INR: 1.14 (ref 0.00–1.49)
PROTHROMBIN TIME: 14.8 s (ref 11.6–15.2)

## 2015-04-14 MED ORDER — ONDANSETRON HCL 4 MG/2ML IJ SOLN: 4.0000 mg | Freq: Four times a day (QID) | INTRAMUSCULAR | Status: DC | PRN

## 2015-04-14 MED ORDER — ACETAMINOPHEN 500 MG PO TABS
1000.0000 mg | ORAL_TABLET | ORAL | Status: DC | PRN
  Administered 2015-04-15: 1000 mg via ORAL
  Filled 2015-04-14: qty 2

## 2015-04-14 MED ORDER — SODIUM CHLORIDE 0.9 % IV BOLUS (SEPSIS)
500.0000 mL | Freq: Once | INTRAVENOUS | Status: AC
  Administered 2015-04-14: 500 mL via INTRAVENOUS

## 2015-04-14 MED ORDER — ENOXAPARIN SODIUM 30 MG/0.3ML ~~LOC~~ SOLN
30.0000 mg | SUBCUTANEOUS | Status: DC
  Administered 2015-04-14: 30 mg via SUBCUTANEOUS
  Filled 2015-04-14: qty 0.3

## 2015-04-14 MED ORDER — CLOZAPINE 100 MG PO TABS
800.0000 mg | ORAL_TABLET | Freq: Every day | ORAL | Status: DC
  Administered 2015-04-14 – 2015-04-17 (×4): 800 mg via ORAL
  Filled 2015-04-14 (×5): qty 8

## 2015-04-14 MED ORDER — HYDROCODONE-ACETAMINOPHEN 5-325 MG PO TABS: 1.0000 | ORAL_TABLET | ORAL | Status: DC | PRN

## 2015-04-14 MED ORDER — NICOTINE 14 MG/24HR TD PT24
14.0000 mg | MEDICATED_PATCH | Freq: Every day | TRANSDERMAL | Status: DC
  Administered 2015-04-15 – 2015-04-18 (×4): 14 mg via TRANSDERMAL
  Filled 2015-04-14 (×4): qty 1

## 2015-04-14 MED ORDER — SODIUM CHLORIDE 0.9 % IV BOLUS (SEPSIS)
1000.0000 mL | Freq: Once | INTRAVENOUS | Status: AC
  Administered 2015-04-14: 1000 mL via INTRAVENOUS

## 2015-04-14 MED ORDER — ACETAMINOPHEN 325 MG PO TABS: 650.0000 mg | ORAL_TABLET | Freq: Four times a day (QID) | ORAL | Status: DC | PRN

## 2015-04-14 MED ORDER — BENZTROPINE MESYLATE 0.5 MG PO TABS
0.5000 mg | ORAL_TABLET | Freq: Every morning | ORAL | Status: DC
  Administered 2015-04-15 – 2015-04-18 (×4): 0.5 mg via ORAL
  Filled 2015-04-14 (×4): qty 1

## 2015-04-14 MED ORDER — PIPERACILLIN-TAZOBACTAM 3.375 G IVPB
3.3750 g | Freq: Three times a day (TID) | INTRAVENOUS | Status: DC
  Administered 2015-04-14 – 2015-04-16 (×5): 3.375 g via INTRAVENOUS
  Filled 2015-04-14 (×4): qty 50

## 2015-04-14 MED ORDER — ONDANSETRON HCL 4 MG PO TABS: 4.0000 mg | ORAL_TABLET | Freq: Four times a day (QID) | ORAL | Status: DC | PRN

## 2015-04-14 MED ORDER — VANCOMYCIN HCL IN DEXTROSE 1-5 GM/200ML-% IV SOLN: 1000.0000 mg | INTRAVENOUS | Status: DC

## 2015-04-14 MED ORDER — SODIUM CHLORIDE 0.9 % IV SOLN
INTRAVENOUS | Status: DC
  Administered 2015-04-14 – 2015-04-17 (×6): via INTRAVENOUS

## 2015-04-14 MED ORDER — PIPERACILLIN-TAZOBACTAM 3.375 G IVPB 30 MIN
3.3750 g | INTRAVENOUS | Status: AC
  Administered 2015-04-14: 3.375 g via INTRAVENOUS
  Filled 2015-04-14: qty 50

## 2015-04-14 MED ORDER — ACETAMINOPHEN 650 MG RE SUPP: 650.0000 mg | Freq: Four times a day (QID) | RECTAL | Status: DC | PRN

## 2015-04-14 MED ORDER — VANCOMYCIN HCL 10 G IV SOLR
1500.0000 mg | Freq: Once | INTRAVENOUS | Status: AC
  Administered 2015-04-14: 1500 mg via INTRAVENOUS
  Filled 2015-04-14: qty 1500

## 2015-04-14 MED ORDER — TAMSULOSIN HCL 0.4 MG PO CAPS
0.4000 mg | ORAL_CAPSULE | Freq: Every day | ORAL | Status: DC
  Administered 2015-04-14 – 2015-04-17 (×4): 0.4 mg via ORAL
  Filled 2015-04-14 (×4): qty 1

## 2015-04-14 MED ORDER — VANCOMYCIN 50 MG/ML ORAL SOLUTION
125.0000 mg | Freq: Four times a day (QID) | ORAL | Status: DC
  Administered 2015-04-14 – 2015-04-16 (×9): 125 mg via ORAL
  Filled 2015-04-14 (×13): qty 2.5

## 2015-04-14 NOTE — ED Provider Notes (Signed)
CSN: [id number]     Arrival date & time 04/14/15  1140 History   First MD Initiated Contact with Patient 04/14/15 1249     Chief Complaint  Patient presents with  . Weakness  . Tachycardia     (Consider location/radiation/quality/duration/timing/severity/associated sxs/prior Treatment) HPI Comments: 59 yo male with hx of schizophrenia and prostate cancer who was recently admitted for sepsis, UTI, C. difficile who presents with generalized malaise, weakness.  He was seen at his urologist office, and found to have unstable vital signs (according to his brother).  History is limited secondary to patient's generalized weakness and ability to participate in the interview. Level V caveat applies.  Patient is a 59 y.o. male presenting with general illness.  Illness Quality:  Generalized malaise and fatigue Severity:  Severe Onset quality:  Gradual Duration:  1 week Timing:  Constant Progression:  Worsening Chronicity:  Recurrent Context:  Recent admission for sepsis, UTI, and C diff.  He was seen at his Urologist office  Relieved by:  Nothing Worsened by:  Nothign Ineffective treatments:  Antibiotics given at rehab facility.   Associated symptoms: shortness of breath   Associated symptoms: no abdominal pain and no fever     Past Medical History  Diagnosis Date  . Paranoid schizophrenia   . Fall at home     x1, 12/2014  . Anxiety   . Depression   . GERD (gastroesophageal reflux disease)     under control  . Foley catheter in place    Past Surgical History  Procedure Laterality Date  . Neck surgery    . Cystoscopy N/A 03/08/2015    Procedure: CYSTOSCOPY;  Surgeon: Lowella Bandy, MD;  Location: [facility];  Service: Urology;  Laterality: N/A;  . Transurethral resection of bladder tumor N/A 03/08/2015    Procedure: TRANSURETHRAL RESECTION OF BLADDER TUMOR (TURBT);  Surgeon: Lowella Bandy, MD;  Location: [facility];  Service: Urology;  Laterality: N/A;  TUR BIOPSY BLADDER  . Prostate biopsy N/A  03/08/2015    Procedure: BIOPSY TRANSRECTAL ULTRASONIC PROSTATE (TUBP);  Surgeon: Lowella Bandy, MD;  Location: [facility];  Service: Urology;  Laterality: N/A;   History reviewed. No pertinent family history. Social History  Substance Use Topics  . Smoking status: Current Every Day Smoker -- 1.00 packs/day for 40 years    Types: Cigarettes  . Smokeless tobacco: Never Used  . Alcohol Use: No    Review of Systems  Constitutional: Negative for fever.  Respiratory: Positive for shortness of breath.   Gastrointestinal: Negative for abdominal pain.  All other systems reviewed and are negative.     Allergies  Review of patient's allergies indicates no known allergies.  Home Medications   Prior to Admission medications   Medication Sig Start Date End Date Taking? Authorizing Provider  acetaminophen (TYLENOL) 500 MG tablet Take 1,000 mg by mouth every 4 (four) hours as needed for moderate pain or headache.   Yes Historical Provider, MD  benztropine (COGENTIN) 0.5 MG tablet Take 0.5 mg by mouth every morning. 12/13/14  Yes Historical Provider, MD  cloZAPine (CLOZARIL) 100 MG tablet Take 800 mg by mouth at bedtime.   Yes Historical Provider, MD  simvastatin (ZOCOR) 20 MG tablet Take 20 mg by mouth daily. 10/10/14  Yes Historical Provider, MD  tamsulosin (FLOMAX) 0.4 MG CAPS capsule Take 1 capsule (0.4 mg total) by mouth daily after supper. 01/20/15  Yes Florencia Reasons, MD  vancomycin (VANCOCIN) 50 mg/mL oral solution Take 2.5 mLs (125 mg total) by  mouth 4 (four) times daily. For 9 additional days Patient taking differently: Take 125 mg by mouth 4 (four) times daily. For 9  Days 04-08-15 to 04-17-15 04/07/15  Yes Costin Karlyne Greenspan, MD   BP 87/58 mmHg  Pulse 117  Temp(Src) 99.7 F (37.6 C) (Rectal)  Resp 20  Wt 177 lb 4 oz (80.4 kg)  SpO2 100% Physical Exam  Constitutional: He is oriented to person, place, and time. He appears well-developed and well-nourished. No distress.  Ill, but not toxic appearing   HENT:  Head: Normocephalic and atraumatic.  Mouth/Throat: Oropharynx is clear and moist.  Eyes: Conjunctivae are normal. Pupils are equal, round, and reactive to light. No scleral icterus.  Neck: Neck supple.  Cardiovascular: Normal rate, regular rhythm, normal heart sounds and intact distal pulses.   No murmur heard. Pulmonary/Chest: Effort normal and breath sounds normal. No stridor. Tachypnea noted. No respiratory distress. He has no wheezes. He has no rales.  Abdominal: Soft. He exhibits no distension. There is no tenderness.  Musculoskeletal: Normal range of motion. He exhibits no edema.  Neurological: He is alert and oriented to person, place, and time. He displays no Babinski's sign on the left side.  Skin: Skin is warm and dry. No rash noted.  Psychiatric: He has a normal mood and affect. His behavior is normal.  Nursing note and vitals reviewed.   ED Course  Procedures (including critical care time) Labs Review Labs Reviewed  CBC WITH DIFFERENTIAL/PLATELET - Abnormal; Notable for the following:    WBC 17.2 (*)    Hemoglobin 12.0 (*)    HCT 37.7 (*)    RDW 17.3 (*)    Neutro Abs 15.3 (*)    Lymphs Abs 0.6 (*)    Monocytes Absolute 1.2 (*)    All other components within normal limits  URINALYSIS, ROUTINE W REFLEX MICROSCOPIC (NOT AT [facility]) - Abnormal; Notable for the following:    APPearance TURBID (*)    Hgb urine dipstick LARGE (*)    Protein, ur 30 (*)    Nitrite POSITIVE (*)    Leukocytes, UA LARGE (*)    All other components within normal limits  URINE MICROSCOPIC-ADD ON - Abnormal; Notable for the following:    Bacteria, UA MANY (*)    All other components within normal limits  I-STAT CG4 LACTIC ACID, ED - Abnormal; Notable for the following:    Lactic Acid, Venous 2.06 (*)    All other components within normal limits  CULTURE, BLOOD (ROUTINE X 2)  CULTURE, BLOOD (ROUTINE X 2)  URINE CULTURE  COMPREHENSIVE METABOLIC PANEL    Imaging Review Dg Chest 2  View  04/14/2015   CLINICAL DATA:  Weakness and tachycardia.  Concern for sepsis  EXAM: CHEST  2 VIEW  COMPARISON:  March 31, 2015  FINDINGS: There is no edema or consolidation. Heart size and pulmonary vascularity are normal. No adenopathy. There is degenerative change in the thoracic spine.  IMPRESSION: No edema or consolidation.   Electronically Signed   By: Lowella Grip III M.D.   On: 04/14/2015 14:03   I have personally reviewed and evaluated these images and lab results as part of my medical decision-making.   EKG Interpretation   Date/Time:  Thursday April 14 2015 14:57:40 EDT Ventricular Rate:  105 PR Interval:  160 QRS Duration: 85 QT Interval:  344 QTC Calculation: 455 R Axis:   32 Text Interpretation:  Sinus tachycardia rate increased compared to prior  Confirmed by [facility]  MD, TREY [phone]) on 04/14/2015 5:38:56 PM      MDM   Final diagnoses:  Sepsis, due to unspecified organism  Sepsis secondary to UTI    Pt has tachycardia and leukocytosis. No fevers.  Code sepsis initiated.  Vanco, zosyn, fluids ordered empirically at time of my initial evaluation.  UA resulted and showed clear evidence of UTI.  He will need admission.      Serita Grit, MD 04/14/15 1740

## 2015-04-14 NOTE — Progress Notes (Signed)
EDCM spoke topatient and [name] at bedside.  [name]'s phone number [phone] or [phone].  Edwin [name] reports the patient is still at [facility].  Edwin [name] reports the patient has had home health with [facility] int he past before he went to [facility].  Patient lives alone when he is at home.  Edwin [name] reports the patient does not have any medical equipment at home.  Edwin [name] confirms patient's pcp is located at the [facility].  Social work consult has already been placed.  No further EDCM needs at this time.

## 2015-04-14 NOTE — H&P (Signed)
[facility] History and Physical  Edwin [name] PJK:[id number] DOB: 04/02/1956 DOA: 04/14/2015  Referring physician: Dr Doy Mince.  PCP: [facility]   Chief Complaint: Weakness. Refer by urology office due to abnormal vitals.   HPI: Edwin [name] is a 59 y.o. male with PMH significant for prostate cancer, chronic foley catheter, paranoid schizophrenia, recently admitted to hospital from 9-05 until 9-08 for Sepsis, C diff, Renal failure. Patient went to his urology office today for follow up. Patient was found to have abnormal vitals, tachycardia and hypotension. He was refer to the ED. Patient provide minimal history. He is alert , follows command, slow in answering questions. This his baseline per brother.  Patient relates he is still having diarrhea. He has not been eating well, the " food Smell". His foley catheter has been leaking urine, per nurse report. ED nurse exchange foley, after exchanging foley significant amount of urine drain.  Patient denies abdominal pain, chest pain or dyspnea. Report mild cough.    Evaluation in the ED; WBC 17, Cr 2.56, BUN 23, UA with too numerous to count WBC. Chest x ray: no edema or consolidation.   Review of Systems:  Negative, except as per HPI  Past Medical History  Diagnosis Date  . Paranoid schizophrenia   . Fall at home     x1, 12/2014  . Anxiety   . Depression   . GERD (gastroesophageal reflux disease)     under control  . Foley catheter in place    Past Surgical History  Procedure Laterality Date  . Neck surgery    . Cystoscopy N/A 03/08/2015    Procedure: CYSTOSCOPY;  Surgeon: Lowella Bandy, MD;  Location: [facility];  Service: Urology;  Laterality: N/A;  . Transurethral resection of bladder tumor N/A 03/08/2015    Procedure: TRANSURETHRAL RESECTION OF BLADDER TUMOR (TURBT);  Surgeon: Lowella Bandy, MD;  Location: [facility];  Service: Urology;  Laterality: N/A;  TUR BIOPSY BLADDER  . Prostate biopsy N/A 03/08/2015    Procedure: BIOPSY  TRANSRECTAL ULTRASONIC PROSTATE (TUBP);  Surgeon: Lowella Bandy, MD;  Location: [facility];  Service: Urology;  Laterality: N/A;   Social History:  reports that he has been smoking Cigarettes.  He has a 40 pack-year smoking history. He has never used smokeless tobacco. He reports that he does not drink alcohol or use illicit drugs.  No Known Allergies  Family history: unable to obtain from patient.    Prior to Admission medications   Medication Sig Start Date End Date Taking? Authorizing Provider  acetaminophen (TYLENOL) 500 MG tablet Take 1,000 mg by mouth every 4 (four) hours as needed for moderate pain or headache.   Yes Historical Provider, MD  benztropine (COGENTIN) 0.5 MG tablet Take 0.5 mg by mouth every morning. 12/13/14  Yes Historical Provider, MD  cloZAPine (CLOZARIL) 100 MG tablet Take 800 mg by mouth at bedtime.   Yes Historical Provider, MD  simvastatin (ZOCOR) 20 MG tablet Take 20 mg by mouth daily. 10/10/14  Yes Historical Provider, MD  tamsulosin (FLOMAX) 0.4 MG CAPS capsule Take 1 capsule (0.4 mg total) by mouth daily after supper. 01/20/15  Yes Florencia Reasons, MD  vancomycin (VANCOCIN) 50 mg/mL oral solution Take 2.5 mLs (125 mg total) by mouth 4 (four) times daily. For 9 additional days Patient taking differently: Take 125 mg by mouth 4 (four) times daily. For 9  Days 04-08-15 to 04-17-15 04/07/15  Yes Costin Karlyne Greenspan, MD   Physical Exam: Filed Vitals:  04/14/15 1300 04/14/15 1315 04/14/15 1356 04/14/15 1400  BP: 101/67  123/67 116/65  Pulse: 106  102 102  Temp:  99.7 F (37.6 C)    TempSrc:  Rectal    Resp: 26  17 16   Weight:      SpO2: 93%  98% 96%    Wt Readings from Last 3 Encounters:  04/14/15 80.4 kg (177 lb 4 oz)  04/07/15 80.4 kg (177 lb 4 oz)  03/08/15 79.833 kg (176 lb)    General:  Appears calm and comfortable Eyes: PERRL, normal lids, irises & conjunctiva ENT: grossly normal hearing, lips & tongue Neck: no LAD, masses or thyromegaly Cardiovascular: RRR, no m/r/g.  No LE edema. Telemetry: SR, no arrhythmias  Respiratory: CTA bilaterally, no w/r/r. Normal respiratory effort. Abdomen: soft, ntnd Skin: no rash or induration seen on limited exam Musculoskeletal: grossly normal tone BUE/BLE Neurologic: grossly non-focal. Slow in respond questions.           Labs on Admission:  Basic Metabolic Panel:  Recent Labs Lab 04/14/15 1221  NA 137  K 4.5  CL 101  CO2 25  GLUCOSE 126*  BUN 23*  CREATININE 2.56*  CALCIUM 9.2   Liver Function Tests:  Recent Labs Lab 04/14/15 1221  AST 18  ALT 15*  ALKPHOS 94  BILITOT 0.6  PROT 8.1  ALBUMIN 3.3*   No results for input(s): LIPASE, AMYLASE in the last 168 hours. No results for input(s): AMMONIA in the last 168 hours. CBC:  Recent Labs Lab 04/14/15 1221  WBC 17.2*  NEUTROABS 15.3*  HGB 12.0*  HCT 37.7*  MCV 82.9  PLT 327   Cardiac Enzymes: No results for input(s): CKTOTAL, CKMB, CKMBINDEX, TROPONINI in the last 168 hours.  BNP (last 3 results) No results for input(s): BNP in the last 8760 hours.  ProBNP (last 3 results) No results for input(s): PROBNP in the last 8760 hours.  CBG: No results for input(s): GLUCAP in the last 168 hours.  Radiological Exams on Admission: Dg Chest 2 View  04/14/2015   CLINICAL DATA:  Weakness and tachycardia.  Concern for sepsis  EXAM: CHEST  2 VIEW  COMPARISON:  March 31, 2015  FINDINGS: There is no edema or consolidation. Heart size and pulmonary vascularity are normal. No adenopathy. There is degenerative change in the thoracic spine.  IMPRESSION: No edema or consolidation.   Electronically Signed   By: Lowella Grip III M.D.   On: 04/14/2015 14:03    EKG: will order EKG.   Assessment/Plan Principal Problem:   Sepsis Active Problems:   Sepsis secondary to UTI   Paranoid schizophrenia, chronic condition   AKI (acute kidney injury)   Enteritis due to Clostridium difficile   1-Sepsis: presents with hypotension, tachycardia, lactic  acid at 2, leukocytosis.  BP respond to IV fluids. Admit to step down unit.  Continue with IV vancomycin and Zosyn.  Chest x ray negative, source probably UTI.  Recent history of C diff. Continue with oral vancomycin.  Lactic acid, blood culture, urine culture.   2-UTI; in setting of chronic foley catheter.  -UA with too numerous to count WBC. - IV zosyn. - He has chronic foley catheter that is leaking. Foley will be exchange in the ED.   3-Acute on chronic renal failure:  -Suspect worsening renal function in setting of hypotension and obstructive neprhopathy. Needs to monitor I and O. If no improvement with fluids. Will need to order renal US.  -nurse exchange foley, good urine  out put after foley exchange.  -IV fluids.   4-C diff colitis; will continue oral vancomycin. He will need long course due to need for IV antibiotics.   5-schizophrenia; continue with currents medications.   6-prostate cancer; was refer o hospital from urology office today. Needs to follow up with urology.    Code Status: full code.  DVT Prophylaxis:lovenox Family Communication: care discussed with brother who was at bedside.  Disposition Plan: expect 3 to 4 days inpatient.   Time spent: 75 minutes.   Niel Hummer A [facility] Pager [phone]

## 2015-04-14 NOTE — ED Notes (Signed)
RN starting IV 

## 2015-04-14 NOTE — ED Notes (Addendum)
Pt c/o weakness, tachycardia, and generally not feeling well starting this morning.  Denies pain.  Denies n/v/d.  Pt was recently discharged after being admitted for sepsis.  Hx of prostate CA and sts he does not currently receive treatment.

## 2015-04-14 NOTE — ED Notes (Signed)
Lactic 2.06, RN notified. No provider signed up at this time.

## 2015-04-14 NOTE — Progress Notes (Signed)
ANTIBIOTIC CONSULT NOTE  Pharmacy Consult for Vanc & Zosyn  Indication: rule out sepsis  No Known Allergies  Patient Measurements: Weight: 177 lb 4 oz (80.4 kg)  Vital Signs: Temp: 99.7 F (37.6 C) (09/15 1315) Temp Source: Rectal (09/15 1315) BP: 87/58 mmHg (09/15 1145) Pulse Rate: 117 (09/15 1145) Intake/Output from previous day:   Intake/Output from this shift:    Labs:  Recent Labs  04/14/15 1221  WBC 17.2*  HGB 12.0*  PLT 327  CREATININE 2.56*   Estimated Creatinine Clearance: 34.3 mL/min (by C-G formula based on Cr of 2.56). No results for input(s): VANCOTROUGH, VANCOPEAK, VANCORANDOM, GENTTROUGH, GENTPEAK, GENTRANDOM, TOBRATROUGH, TOBRAPEAK, TOBRARND, AMIKACINPEAK, AMIKACINTROU, AMIKACIN in the last 72 hours.   Microbiology: Recent Results (from the past 720 hour(s))  Blood Culture (routine x 2)     Status: None   Collection Time: 03/31/15  3:10 PM  Result Value Ref Range Status   Specimen Description BLOOD RIGHT ANTECUBITAL  Final   Special Requests BOTTLES DRAWN AEROBIC AND ANAEROBIC 5CC  Final   Culture   Final    NO GROWTH 5 DAYS Performed at [facility]    Report Status 04/05/2015 FINAL  Final  Blood Culture (routine x 2)     Status: None   Collection Time: 03/31/15  3:20 PM  Result Value Ref Range Status   Specimen Description BLOOD LEFT HAND  Final   Special Requests BOTTLES DRAWN AEROBIC ONLY Bode  Final   Culture   Final    NO GROWTH 5 DAYS Performed at [facility]    Report Status 04/05/2015 FINAL  Final  Urine culture     Status: None   Collection Time: 03/31/15  3:52 PM  Result Value Ref Range Status   Specimen Description URINE, CATHETERIZED  Final   Special Requests NONE  Final   Culture   Final    NO GROWTH 1 DAY Performed at [facility]    Report Status 04/01/2015 FINAL  Final  MRSA PCR Screening     Status: None   Collection Time: 03/31/15  6:21 PM  Result Value Ref Range Status   MRSA by PCR NEGATIVE  NEGATIVE Final    Comment:        The GeneXpert MRSA Assay (FDA approved for NASAL specimens only), is one component of a comprehensive MRSA colonization surveillance program. It is not intended to diagnose MRSA infection nor to guide or monitor treatment for MRSA infections.   C difficile quick scan w PCR reflex     Status: Abnormal   Collection Time: 04/02/15  5:57 AM  Result Value Ref Range Status   C Diff antigen POSITIVE (A) NEGATIVE Final   C Diff toxin POSITIVE (A) NEGATIVE Final   C Diff interpretation Positive for toxigenic C. difficile  Final    Comment: CRITICAL RESULT CALLED TO, READ BACK BY AND VERIFIED WITH: GARCIA,K/4W @0723  ON 04/02/15 BY KARCZEWSKI,S.     Anti-infectives    Start     Dose/Rate Route Frequency Ordered Stop   04/14/15 1330  vancomycin (VANCOCIN) 1,500 mg in sodium chloride 0.9 % 500 mL IVPB     1,500 mg 250 mL/hr over 120 Minutes Intravenous  Once 04/14/15 1324     04/14/15 1315  piperacillin-tazobactam (ZOSYN) IVPB 3.375 g     3.375 g 100 mL/hr over 30 Minutes Intravenous STAT 04/14/15 1309 04/14/15 1345      Assessment: 59 yo M with hx Prostate CA and schizophrenia presents  with weakness, hypotension, tachycardia.   He was discharged to SNF on 04/07/15 after admission for sepsis from presumed urinary source.  Also +Cdiff for which he is currently on oral Vancomycin.     He is afebrile on admission, however WBC elevated and LA elevated (2.06).   Scr is elevated- estimated CrCl ~ 30-73ml/min.   9/15 >>Vancomycin  >> 9/15 >> Zosyn >>    9/15 blood: IP 9/15 urine: IP  Dose changes/levels:  Goal of Therapy:  Vancomycin trough level 15-20 mcg/ml  Eradicate infection.  Plan:  Zosyn 3.375gm IV Q8h to be infused over 4hrs Vancomycin 1500mg  IV x1 now then 1gm IV q24h Check Vancomycin trough at steady state Monitor renal function and cx data   Biagio Borg 04/14/2015,1:48 PM

## 2015-04-14 NOTE — ED Notes (Signed)
Notified Dr. Doy Mince of Lactic result

## 2015-04-14 NOTE — Progress Notes (Signed)
Utilization Review completed.  Kassia Demarinis RN CM  

## 2015-04-15 LAB — CBC
HEMATOCRIT: 31 % — AB (ref 39.0–52.0)
HEMOGLOBIN: 10 g/dL — AB (ref 13.0–17.0)
MCH: 26.5 pg (ref 26.0–34.0)
MCHC: 32.3 g/dL (ref 30.0–36.0)
MCV: 82.2 fL (ref 78.0–100.0)
Platelets: 274 10*3/uL (ref 150–400)
RBC: 3.77 MIL/uL — AB (ref 4.22–5.81)
RDW: 17.1 % — AB (ref 11.5–15.5)
WBC: 13.4 10*3/uL — AB (ref 4.0–10.5)

## 2015-04-15 LAB — COMPREHENSIVE METABOLIC PANEL
ALT: 12 U/L — ABNORMAL LOW (ref 17–63)
ANION GAP: 6 (ref 5–15)
AST: 11 U/L — ABNORMAL LOW (ref 15–41)
Albumin: 2.4 g/dL — ABNORMAL LOW (ref 3.5–5.0)
Alkaline Phosphatase: 82 U/L (ref 38–126)
BILIRUBIN TOTAL: 0.9 mg/dL (ref 0.3–1.2)
BUN: 17 mg/dL (ref 6–20)
CO2: 25 mmol/L (ref 22–32)
Calcium: 8.2 mg/dL — ABNORMAL LOW (ref 8.9–10.3)
Chloride: 111 mmol/L (ref 101–111)
Creatinine, Ser: 2.26 mg/dL — ABNORMAL HIGH (ref 0.61–1.24)
GFR, EST AFRICAN AMERICAN: 36 mL/min — AB (ref >60)
GFR, EST NON AFRICAN AMERICAN: 31 mL/min — AB (ref >60)
Glucose, Bld: 108 mg/dL — ABNORMAL HIGH (ref 65–99)
POTASSIUM: 4.3 mmol/L (ref 3.5–5.1)
Sodium: 142 mmol/L (ref 135–145)
TOTAL PROTEIN: 6.3 g/dL — AB (ref 6.5–8.1)

## 2015-04-15 MED ORDER — ENOXAPARIN SODIUM 40 MG/0.4ML ~~LOC~~ SOLN
40.0000 mg | SUBCUTANEOUS | Status: DC
  Administered 2015-04-15 – 2015-04-17 (×3): 40 mg via SUBCUTANEOUS
  Filled 2015-04-15 (×3): qty 0.4

## 2015-04-15 MED ORDER — CETYLPYRIDINIUM CHLORIDE 0.05 % MT LIQD
7.0000 mL | Freq: Two times a day (BID) | OROMUCOSAL | Status: DC
  Administered 2015-04-16 – 2015-04-18 (×5): 7 mL via OROMUCOSAL

## 2015-04-15 NOTE — Progress Notes (Signed)
[facility] PROGRESS NOTE  Edwin [name] HUT:[id number] DOB: May 02, 1956 DOA: 04/14/2015 PCP: [facility]  Assessment/Plan: 1-Sepsis: presents with hypotension, tachycardia, lactic acid at 2, leukocytosis.  Continue with IV Zosyn. Chest x ray negative, will discontinue vancomycin IV.  Chest x ray negative, source probably UTI.  Recent history of C diff. Continue with oral vancomycin.  Lactic acid trending down. \ -blood culture, urine culture pending. .  -Vitals stable. Continue to monitor in the step down unit.   2-UTI; in setting of chronic foley catheter.  -UA with too numerous to count WBC. - IV zosyn. - He has chronic foley catheter that is leaking. Foley exchange in the ED.  -Follow urine culture.    3-Acute on chronic renal failure:  -Suspect worsening renal function in setting of hypotension and obstructive neprhopathy.  -nurse exchange foley, good urine out put after foley exchange.  -IV fluids. Cr trending down. Good urine out put 4.6 L since yesterday.   4-C diff colitis; will continue oral vancomycin. He will need long course due to need for IV antibiotics.  Per nurse report no significant Diarrhea, BM.   5-schizophrenia; continue with currents medications.   6-prostate cancer; was refer o hospital from urology office. Needs to follow up with urology.   7-Encephalopathy; In setting of acute illness. Monitor.   Code Status: Full Code.  Family Communication: Care discussed with patient.  Disposition Plan: Remain in the step down unit   Consultants:  none  Procedures:  none  Antibiotics:  Zosyn 9-15  Vancomycin for one day.   Oral vancomycin.   HPI/Subjective: Sleepy, wake up says few words.  Denies pain.   Objective: Filed Vitals:   04/15/15 0800  BP: 104/57  Pulse: 96  Temp:   Resp: 27    Intake/Output Summary (Last 24 hours) at 04/15/15 0838 Last data filed at 04/15/15 0604  Gross per 24 hour  Intake 5033.33 ml   Output   4625 ml  Net 408.33 ml   Filed Weights   04/14/15 1203 04/14/15 1846  Weight: 80.4 kg (177 lb 4 oz) 74 kg (163 lb 2.3 oz)    Exam:   General:  Sleepy, wake up says few words.   Cardiovascular: S 1, S 2 RRR  Respiratory: Bilateral ronchus/   Abdomen: BS present, soft, nt  Musculoskeletal: no edema  Data Reviewed: Basic Metabolic Panel:  Recent Labs Lab 04/14/15 1221 04/15/15 0400  NA 137 142  K 4.5 4.3  CL 101 111  CO2 25 25  GLUCOSE 126* 108*  BUN 23* 17  CREATININE 2.56* 2.26*  CALCIUM 9.2 8.2*   Liver Function Tests:  Recent Labs Lab 04/14/15 1221 04/15/15 0400  AST 18 11*  ALT 15* 12*  ALKPHOS 94 82  BILITOT 0.6 0.9  PROT 8.1 6.3*  ALBUMIN 3.3* 2.4*   No results for input(s): LIPASE, AMYLASE in the last 168 hours. No results for input(s): AMMONIA in the last 168 hours. CBC:  Recent Labs Lab 04/14/15 1221 04/15/15 0400  WBC 17.2* 13.4*  NEUTROABS 15.3*  --   HGB 12.0* 10.0*  HCT 37.7* 31.0*  MCV 82.9 82.2  PLT 327 274   Cardiac Enzymes: No results for input(s): CKTOTAL, CKMB, CKMBINDEX, TROPONINI in the last 168 hours. BNP (last 3 results) No results for input(s): BNP in the last 8760 hours.  ProBNP (last 3 results) No results for input(s): PROBNP in the last 8760 hours.  CBG: No results for input(s): GLUCAP in the last  168 hours.  No results found for this or any previous visit (from the past 240 hour(s)).   Studies: Dg Chest 2 View  04/14/2015   CLINICAL DATA:  Weakness and tachycardia.  Concern for sepsis  EXAM: CHEST  2 VIEW  COMPARISON:  March 31, 2015  FINDINGS: There is no edema or consolidation. Heart size and pulmonary vascularity are normal. No adenopathy. There is degenerative change in the thoracic spine.  IMPRESSION: No edema or consolidation.   Electronically Signed   By: Lowella Grip III M.D.   On: 04/14/2015 14:03    Scheduled Meds: . benztropine  0.5 mg Oral q morning - 10a  . cloZAPine  800 mg  Oral QHS  . enoxaparin (LOVENOX) injection  30 mg Subcutaneous Q24H  . nicotine  14 mg Transdermal Daily  . piperacillin-tazobactam (ZOSYN)  IV  3.375 g Intravenous 3 times per day  . tamsulosin  0.4 mg Oral QPC supper  . vancomycin  125 mg Oral QID  . vancomycin  1,000 mg Intravenous Q24H   Continuous Infusions: . sodium chloride 125 mL/hr at 04/14/15 1900    Principal Problem:   Sepsis Active Problems:   Sepsis secondary to UTI   Paranoid schizophrenia, chronic condition   AKI (acute kidney injury)   Enteritis due to Clostridium difficile    Time spent: 35 minutes.     Niel Hummer A  [facility] Pager [phone]. If 7PM-7AM, please contact night-coverage at [email or web address], password [facility] 04/15/2015, 8:38 AM  LOS: 1 day

## 2015-04-16 DIAGNOSIS — F200 Paranoid schizophrenia: Secondary | ICD-10-CM

## 2015-04-16 DIAGNOSIS — G9340 Encephalopathy, unspecified: Secondary | ICD-10-CM

## 2015-04-16 LAB — CBC
HEMATOCRIT: 31.7 % — AB (ref 39.0–52.0)
HEMOGLOBIN: 10 g/dL — AB (ref 13.0–17.0)
MCH: 26.4 pg (ref 26.0–34.0)
MCHC: 31.5 g/dL (ref 30.0–36.0)
MCV: 83.6 fL (ref 78.0–100.0)
Platelets: 307 10*3/uL (ref 150–400)
RBC: 3.79 MIL/uL — AB (ref 4.22–5.81)
RDW: 17.2 % — ABNORMAL HIGH (ref 11.5–15.5)
WBC: 8.8 10*3/uL (ref 4.0–10.5)

## 2015-04-16 LAB — BASIC METABOLIC PANEL
Anion gap: 7 (ref 5–15)
BUN: 13 mg/dL (ref 6–20)
CHLORIDE: 113 mmol/L — AB (ref 101–111)
CO2: 23 mmol/L (ref 22–32)
Calcium: 8.1 mg/dL — ABNORMAL LOW (ref 8.9–10.3)
Creatinine, Ser: 1.97 mg/dL — ABNORMAL HIGH (ref 0.61–1.24)
GFR calc Af Amer: 42 mL/min — ABNORMAL LOW (ref >60)
GFR calc non Af Amer: 36 mL/min — ABNORMAL LOW (ref >60)
GLUCOSE: 82 mg/dL (ref 65–99)
POTASSIUM: 3.7 mmol/L (ref 3.5–5.1)
Sodium: 143 mmol/L (ref 135–145)

## 2015-04-16 LAB — URINE CULTURE

## 2015-04-16 MED ORDER — DEXTROSE 5 % IV SOLN
1.0000 g | INTRAVENOUS | Status: DC
  Administered 2015-04-16 – 2015-04-17 (×2): 1 g via INTRAVENOUS
  Filled 2015-04-16 (×2): qty 10

## 2015-04-16 NOTE — Evaluation (Signed)
Occupational Therapy Evaluation Patient Details Name: Edwin [name] MRN: [id number] DOB: March 14, 1956 Today's Date: 04/16/2015    History of Present Illness Pt was at [facility] for rehab.  Admitted for sepsis from uti.  H/O schizophrenia, prostate CA, catheter   Clinical Impression   This 59 year old man was admitted for the above.  He will benefit from skilled OT.  Pt was receiving rehab prior to this admission.  He needs overall min A for ambulating and min guard for adls.  Goals in acute are for supervision level    Follow Up Recommendations  SNF    Equipment Recommendations  3 in 1 bedside comode    Recommendations for Other Services       Precautions / Restrictions Precautions Precautions: Fall Restrictions Weight Bearing Restrictions: No      Mobility Bed Mobility   Bed Mobility: Supine to Sit     Supine to sit: Supervision     General bed mobility comments: manage lines; for safety  Transfers Overall transfer level: Needs assistance Equipment used: Rolling walker (2 wheeled) Transfers: Sit to/from Stand Sit to Stand: Min guard         General transfer comment: for safety    Balance                                            ADL Overall ADL's : Needs assistance/impaired                         Toilet Transfer: Minimal assistance;Ambulation (back to bed)             General ADL Comments: Pt needs min guard A for LB adls--for sit to stand.  Pt was seen last admission by this OT:  he is much less impulsive, had +2 for lines.  Pt used RW without cues.       Vision     Perception     Praxis      Pertinent Vitals/Pain Pain Assessment: No/denies pain     Hand Dominance     Extremity/Trunk Assessment Upper Extremity Assessment Upper Extremity Assessment: Overall WFL for tasks assessed           Communication Communication Communication: No difficulties   Cognition Arousal/Alertness:  Awake/alert Behavior During Therapy: Flat affect Overall Cognitive Status: Within Functional Limits for tasks assessed                     General Comments       Exercises       Shoulder Instructions      Home Living Family/patient expects to be discharged to:: Skilled nursing facility                                 Additional Comments: was at [facility] receiving rehab      Prior Functioning/Environment Level of Independence: Needs assistance             OT Diagnosis: Generalized weakness   OT Problem List: Decreased strength;Decreased activity tolerance;Impaired balance (sitting and/or standing);Decreased knowledge of use of DME or AE   OT Treatment/Interventions: Self-care/ADL training;DME and/or AE instruction;Patient/family education;Balance training    OT Goals(Current goals can be found in the care plan section) Acute Rehab OT Goals Patient Stated Goal:  none stated OT Goal Formulation: With patient Time For Goal Achievement: 04/30/15 Potential to Achieve Goals: Good ADL Goals Pt Will Perform Grooming: with supervision;standing Pt Will Transfer to Toilet: with supervision;ambulating;bedside commode Additional ADL Goal #1: pt will complete ADL with set up/supervision sit to stand  OT Frequency: Min 2X/week   Barriers to D/C:            Co-evaluation PT/OT/SLP Co-Evaluation/Treatment: Yes Reason for Co-Treatment: For patient/therapist safety PT goals addressed during session: Mobility/safety with mobility OT goals addressed during session: ADL's and self-care      End of Session Nurse Communication:  (present for IV problem)  Activity Tolerance: Patient tolerated treatment well Patient left: in bed;with call bell/phone within reach;with nursing/sitter in room   Time: 3532-9924 OT Time Calculation (min): 21 min Charges:  OT General Charges $OT Visit: 1 Procedure OT Evaluation $Initial OT Evaluation Tier I: 1  Procedure G-Codes:    SPENCER,MARYELLEN 05/14/2015, 2:30 PM Lesle Chris, OTR/L [phone] 05-14-15

## 2015-04-16 NOTE — Evaluation (Signed)
Physical Therapy Evaluation Patient Details Name: Edwin [name] MRN: [id number] DOB: 1955/12/11 Today's Date: 04/16/2015   History of Present Illness  Pt was at [facility] for rehab.  Admitted for sepsis from uti.  H/O schizophrenia, prostate CA, catheter  Clinical Impression  Patient iw stronger than last admit, requires assist for ambulation. Patient will benefit from Acute PT to improve strength and safety to DC to snf.    Follow Up Recommendations SNF;Supervision/Assistance - 24 hour    Equipment Recommendations  None recommended by PT    Recommendations for Other Services       Precautions / Restrictions Precautions Precautions: Fall Restrictions Weight Bearing Restrictions: No      Mobility  Bed Mobility Overal bed mobility: Needs Assistance Bed Mobility: Supine to Sit     Supine to sit: Supervision Sit to supine: Supervision   General bed mobility comments: manage lines; for safety  Transfers Overall transfer level: Needs assistance Equipment used: Rolling walker (2 wheeled) Transfers: Sit to/from Stand Sit to Stand: Min guard         General transfer comment: for safety  Ambulation/Gait Ambulation/Gait assistance: Min guard Ambulation Distance (Feet): 200 Feet Assistive device: Rolling walker (2 wheeled) Gait Pattern/deviations: Step-through pattern Gait velocity: decreased   General Gait Details: gait is improved from previous admit. IV leaking so had to limit distance.  Stairs            Wheelchair Mobility    Modified Rankin (Stroke Patients Only)       Balance   Sitting-balance support: No upper extremity supported;Feet supported Sitting balance-Leahy Scale: Good     Standing balance support: During functional activity;No upper extremity supported Standing balance-Leahy Scale: Poor                               Pertinent Vitals/Pain Pain Assessment: No/denies pain    Home Living Family/patient expects to  be discharged to:: Skilled nursing facility                 Additional Comments: was at [facility] receiving rehab    Prior Function Level of Independence: Needs assistance               Hand Dominance        Extremity/Trunk Assessment   Upper Extremity Assessment: Defer to OT evaluation           Lower Extremity Assessment: Generalized weakness      Cervical / Trunk Assessment: Normal  Communication   Communication: Other (comment) (voice is very low and mumbling)  Cognition Arousal/Alertness: Awake/alert Behavior During Therapy: Flat affect Overall Cognitive Status: Within Functional Limits for tasks assessed                 General Comments: follows commands / aware of his situation    General Comments      Exercises        Assessment/Plan    PT Assessment Patient needs continued PT services  PT Diagnosis Difficulty walking;Generalized weakness   PT Problem List Decreased strength;Decreased activity tolerance;Decreased balance;Decreased mobility;Decreased safety awareness  PT Treatment Interventions     PT Goals (Current goals can be found in the Care Plan section) Acute Rehab PT Goals Patient Stated Goal: none stated PT Goal Formulation: With patient Time For Goal Achievement: 04/30/15 Potential to Achieve Goals: Good    Frequency     Barriers to discharge Decreased caregiver support  Co-evaluation PT/OT/SLP Co-Evaluation/Treatment: Yes Reason for Co-Treatment: For patient/therapist safety PT goals addressed during session: Mobility/safety with mobility OT goals addressed during session: ADL's and self-care       End of Session   Activity Tolerance: Patient tolerated treatment well Patient left: with call bell/phone within reach;in bed;with bed alarm set Nurse Communication: Mobility status         Time: 4008-6761 PT Time Calculation (min) (ACUTE ONLY): 21 min   Charges:   PT Evaluation $Initial PT  Evaluation Tier I: 1 Procedure     PT G CodesClaretha [name] 04/16/2015, 3:18 PM Edwin [name] PT [phone]

## 2015-04-16 NOTE — Progress Notes (Signed)
[facility] PROGRESS NOTE  Edwin [name] MEB:[id number] DOB: 05/18/1956 DOA: 04/14/2015 PCP: [facility]  Assessment/Plan: 1-Sepsis: presents with hypotension, tachycardia, lactic acid at 2, leukocytosis.  Continue with IV Zosyn. Chest x ray negative,  vancomycin IV was discontinue.  Chest x ray negative, source probably UTI.  Recent history of C diff. Continue with oral vancomycin.  Lactic acid trending down.  -blood culture, urine culture  Growing gram negative rods.  -Vitals stable. Transfer to med-surgery  2-UTI; in setting of chronic foley catheter.  -UA with too numerous to count WBC. - IV zosyn day 2.  - He has chronic foley catheter that is leaking. Foley exchange in the ED 9-15.  - urine culture growing gram negative rods. Marland Kitchen   3-Acute on chronic renal failure:  -Suspect worsening renal function in setting of hypotension and obstructive neprhopathy.  -nurse exchange foley 9-15, good urine out put after foley exchange.  -IV fluids. Cr trending down. Good urine out put 4.0 9-16 -labs pending.   4-C diff colitis; will continue oral vancomycin. He will need long course due to need for IV antibiotics.  No diarrhea reported.   5-schizophrenia; continue with currents medications.   6-prostate cancer; was refer o hospital from urology office. Needs to follow up with urology.   7-Encephalopathy; In setting of acute illness. Monitor. Improved. Alert , following command. Oriented to place and situation.   Code Status: Full Code.  Family Communication: Care discussed with patient.  Disposition Plan: transfer to med-surgery   Consultants:  none  Procedures:  none  Antibiotics:  Zosyn 9-15  Vancomycin for one day.   Oral vancomycin.   HPI/Subjective: He is alert. Denies abdominal pain, diarrhea.   Objective: Filed Vitals:   04/16/15 0600  BP: 109/69  Pulse: 88  Temp:   Resp: 23    Intake/Output Summary (Last 24 hours) at 04/16/15  0835 Last data filed at 04/16/15 0700  Gross per 24 hour  Intake   4285 ml  Output   4050 ml  Net    235 ml   Filed Weights   04/14/15 1203 04/14/15 1846 04/16/15 0400  Weight: 80.4 kg (177 lb 4 oz) 74 kg (163 lb 2.3 oz) 73.1 kg (161 lb 2.5 oz)    Exam:   General:  Alert in no distress  Cardiovascular: S 1, S 2 RRR  Respiratory: Bilateral ronchus/   Abdomen: BS present, soft, nt  Musculoskeletal: no edema  Data Reviewed: Basic Metabolic Panel:  Recent Labs Lab 04/14/15 1221 04/15/15 0400  NA 137 142  K 4.5 4.3  CL 101 111  CO2 25 25  GLUCOSE 126* 108*  BUN 23* 17  CREATININE 2.56* 2.26*  CALCIUM 9.2 8.2*   Liver Function Tests:  Recent Labs Lab 04/14/15 1221 04/15/15 0400  AST 18 11*  ALT 15* 12*  ALKPHOS 94 82  BILITOT 0.6 0.9  PROT 8.1 6.3*  ALBUMIN 3.3* 2.4*   No results for input(s): LIPASE, AMYLASE in the last 168 hours. No results for input(s): AMMONIA in the last 168 hours. CBC:  Recent Labs Lab 04/14/15 1221 04/15/15 0400  WBC 17.2* 13.4*  NEUTROABS 15.3*  --   HGB 12.0* 10.0*  HCT 37.7* 31.0*  MCV 82.9 82.2  PLT 327 274   Cardiac Enzymes: No results for input(s): CKTOTAL, CKMB, CKMBINDEX, TROPONINI in the last 168 hours. BNP (last 3 results) No results for input(s): BNP in the last 8760 hours.  ProBNP (last 3 results) No results  for input(s): PROBNP in the last 8760 hours.  CBG: No results for input(s): GLUCAP in the last 168 hours.  Recent Results (from the past 240 hour(s))  Culture, blood (routine x 2)     Status: None (Preliminary result)   Collection Time: 04/14/15 12:21 PM  Result Value Ref Range Status   Specimen Description BLOOD LEFT ANTECUBITAL  Final   Special Requests BOTTLES DRAWN AEROBIC AND ANAEROBIC 5CC  Final   Culture   Final    NO GROWTH 1 DAY Performed at [facility]    Report Status PENDING  Incomplete  Culture, blood (routine x 2)     Status: None (Preliminary result)   Collection Time:  04/14/15 12:47 PM  Result Value Ref Range Status   Specimen Description BLOOD LEFT FOREARM  Final   Special Requests BOTTLES DRAWN AEROBIC AND ANAEROBIC 5CC  Final   Culture   Final    NO GROWTH 1 DAY Performed at [facility]    Report Status PENDING  Incomplete  Urine culture     Status: None (Preliminary result)   Collection Time: 04/14/15  2:47 PM  Result Value Ref Range Status   Specimen Description URINE, CATHETERIZED  Final   Special Requests NONE  Final   Culture   Final    >=100,000 COLONIES/mL GRAM NEGATIVE RODS Performed at [facility]    Report Status PENDING  Incomplete     Studies: Dg Chest 2 View  04/14/2015   CLINICAL DATA:  Weakness and tachycardia.  Concern for sepsis  EXAM: CHEST  2 VIEW  COMPARISON:  March 31, 2015  FINDINGS: There is no edema or consolidation. Heart size and pulmonary vascularity are normal. No adenopathy. There is degenerative change in the thoracic spine.  IMPRESSION: No edema or consolidation.   Electronically Signed   By: Lowella Grip III M.D.   On: 04/14/2015 14:03    Scheduled Meds: . antiseptic oral rinse  7 mL Mouth Rinse BID  . benztropine  0.5 mg Oral q morning - 10a  . cloZAPine  800 mg Oral QHS  . enoxaparin (LOVENOX) injection  40 mg Subcutaneous Q24H  . nicotine  14 mg Transdermal Daily  . piperacillin-tazobactam (ZOSYN)  IV  3.375 g Intravenous 3 times per day  . tamsulosin  0.4 mg Oral QPC supper  . vancomycin  125 mg Oral QID   Continuous Infusions: . sodium chloride 125 mL/hr at 04/16/15 0092    Principal Problem:   Sepsis Active Problems:   Sepsis secondary to UTI   Paranoid schizophrenia, chronic condition   AKI (acute kidney injury)   Acute encephalopathy   Enteritis due to Clostridium difficile    Time spent: 35 minutes.     Niel Hummer A  [facility] Pager [phone]. If 7PM-7AM, please contact night-coverage at [email or web address], password [facility] 04/16/2015, 8:35 AM  LOS: 2  days

## 2015-04-17 LAB — BASIC METABOLIC PANEL
ANION GAP: 9 (ref 5–15)
BUN: 10 mg/dL (ref 6–20)
CHLORIDE: 109 mmol/L (ref 101–111)
CO2: 22 mmol/L (ref 22–32)
CREATININE: 1.68 mg/dL — AB (ref 0.61–1.24)
Calcium: 8.1 mg/dL — ABNORMAL LOW (ref 8.9–10.3)
GFR calc non Af Amer: 44 mL/min — ABNORMAL LOW (ref >60)
GFR, EST AFRICAN AMERICAN: 51 mL/min — AB (ref >60)
Glucose, Bld: 80 mg/dL (ref 65–99)
POTASSIUM: 3.9 mmol/L (ref 3.5–5.1)
SODIUM: 140 mmol/L (ref 135–145)

## 2015-04-17 LAB — CBC
HEMATOCRIT: 32.3 % — AB (ref 39.0–52.0)
HEMOGLOBIN: 10.2 g/dL — AB (ref 13.0–17.0)
MCH: 26.4 pg (ref 26.0–34.0)
MCHC: 31.6 g/dL (ref 30.0–36.0)
MCV: 83.5 fL (ref 78.0–100.0)
Platelets: 351 10*3/uL (ref 150–400)
RBC: 3.87 MIL/uL — AB (ref 4.22–5.81)
RDW: 17 % — ABNORMAL HIGH (ref 11.5–15.5)
WBC: 8.4 10*3/uL (ref 4.0–10.5)

## 2015-04-17 MED ORDER — SULFAMETHOXAZOLE-TRIMETHOPRIM 800-160 MG PO TABS
1.0000 | ORAL_TABLET | Freq: Two times a day (BID) | ORAL | Status: DC
  Administered 2015-04-17 – 2015-04-18 (×2): 1 via ORAL
  Filled 2015-04-17 (×2): qty 1

## 2015-04-17 MED ORDER — VANCOMYCIN 50 MG/ML ORAL SOLUTION
125.0000 mg | Freq: Four times a day (QID) | ORAL | Status: DC
  Administered 2015-04-17 – 2015-04-18 (×5): 125 mg via ORAL
  Filled 2015-04-17 (×8): qty 2.5

## 2015-04-17 NOTE — Clinical Social Work Note (Signed)
CSW sent clinicals to [facility] where pt is long time resident  .Dede Query, LCSW [facility] Clinical Social Worker - Weekend Coverage cell #: [phone]

## 2015-04-17 NOTE — Progress Notes (Signed)
ANTIBIOTIC CONSULT NOTE  Pharmacy Consult for Sulfamethoxazole/Trimethoprim Indication: UTI  No Known Allergies  Patient Measurements: Height: 5\' 11"  (180.3 cm) Weight: 164 lb (74.39 kg) IBW/kg (Calculated) : 75.3   Labs:  Recent Labs  04/15/15 0400 04/16/15 0907 04/17/15 0340  WBC 13.4* 8.8 8.4  HGB 10.0* 10.0* 10.2*  PLT 274 307 351  CREATININE 2.26* 1.97* 1.68*   Estimated Creatinine Clearance: 51.7 mL/min (by C-G formula based on Cr of 1.68).   Assessment: 60 yo M with hx Prostate CA and schizophrenia presents with weakness, hypotension, tachycardia.  He was discharged to SNF on 04/07/15 after admission for sepsis from presumed urinary source.  Also +Cdiff for which he is currently on oral Vancomycin.  Pharmacy was initially consulted to dose vancomycin and zosyn, which were narrowed to ceftriaxone on 9/17.  Now, pharmacy is consulted to dose Sulfamethoxazole/Trimethoprim for Serratia UTI.  PTA, 9/8 >> Oral Vanc for Cdiff >> 9/15 >>Vancomycin >> 9/16 9/15 >> Zosyn >> 9/17 9/17 >> Ceftriaxone >> 9/18 9/18 >> Sulfa/trim >>   Today, 04/17/2015: Afebrile WBC 8.4 SCr 1.68, CrCl ~ 51 ml/min  Goal of Therapy:  Appropriate abx dosing, eradication of infection.  Plan:   Bactrim DS 1 tablet PO BID.  Recommend monitoring renal fxn and watch potassium levels  Pharmacy will s/o note writing, but follow peripherally if renal dosing is needed.  Gretta Arab PharmD, BCPS Pager [phone] 04/17/2015 1:59 PM

## 2015-04-17 NOTE — Clinical Social Work Note (Signed)
Clinical Social Work Assessment  Patient Details  Name: Edwin [name] MRN: [id number] Date of Birth: [birth year]  Date of referral:  04/17/15               Reason for consult:  Facility Placement                Permission sought to share information with:  Facility Art therapist granted to share information::  Yes, Verbal Permission Granted  Name::        Agency::     Relationship::     Contact Information:     Housing/Transportation Living arrangements for the past 2 months:  [place] of Information:  Patient Patient Interpreter Needed:    Criminal Activity/Legal Involvement Pertinent to Current Situation/Hospitalization:    Significant Relationships:  Siblings Lives with:  Facility Resident Do you feel safe going back to the place where you live?    Need for family participation in patient care:  No (Coment)  Care giving concerns:  No caregiver   Facilities manager / plan:  CSW met with pt to discuss discharge.  CSW prompted pt to discuss history and needs.  CSW encouraged pt to discuss his discharge plans.   Employment status:  Disabled (Comment on whether or not currently receiving Disability) Insurance information:  Managed Care PT Recommendations:  [place] / Referral to community resources:     Patient/Family's Response to care:  Pt found it hard to stay awake but did stated that he had been resident of [facility] for 3 years.  Stated he wants to return at discharge  Patient/Family's Understanding of and Emotional Response to Diagnosis, Current Treatment, and Prognosis:  Pt could not stay awake  Emotional Assessment Appearance:  Appears stated age Attitude/Demeanor/Rapport:  Sedated Affect (typically observed):   (drowsy) Orientation:  Oriented to Self, Oriented to Place, Oriented to  Time, Oriented to Situation Alcohol / Substance use:    Psych involvement (Current and /or in the  community):  No (Comment)  Discharge Needs  Concerns to be addressed:    Readmission within the last 30 days:    Current discharge risk:    Barriers to Discharge:  No Barriers Identified   Carlean Jews, LCSW 04/17/2015, 6:35 PM

## 2015-04-17 NOTE — Progress Notes (Signed)
[facility] PROGRESS NOTE  Edwin [name] RWE:[id number] DOB: Feb 16, 1956 DOA: 04/14/2015 PCP: [facility]  Assessment/Plan: 1-Sepsis: secondary to UTI.  presents with hypotension, tachycardia, lactic acid at 2, leukocytosis.  -Chest x ray negative,  vancomycin IV was discontinue.  -Chest x ray negative, source probably UTI.  -Recent history of C diff. Continue with oral vancomycin.  -Lactic acid trending down.  -Blood culture, urine culture Growing Serratia Marcescens, sensitive to Bactrim, cipro, ceftriaxone.    2-UTI; in setting of chronic foley catheter.  -UA with too numerous to count WBC. -antibiotics day 3. He will need treatment for 2 weeks antibiotics. Will start Bactrim.  - He has chronic foley catheter that is leaking. Foley exchange in the ED 9-15.  - urine culture growing serratia.   3-Acute on chronic renal failure:  -Suspect worsening renal function in setting of hypotension and obstructive neprhopathy.  -nurse exchange foley 9-15, good urine out put after foley exchange.  -IV fluids. Cr trending down. Good urine out put 4.0 9-16 -improving. Monitor on Bactrim. Using Cipro will increase chances for C diff.   4-C diff colitis; will continue oral vancomycin. He will need long course. Would continue oral vancomycin 1 more week after stopping Bactrim.  No diarrhea reported.    5-schizophrenia; continue with currents medications.   6-prostate cancer; was refer o hospital from urology office. Needs to follow up with urology.   7-Encephalopathy; In setting of acute illness. Monitor. Improved. Alert , following command. Oriented to place and situation. Improved.   Code Status: Full Code.  Family Communication: Care discussed with patient.  Disposition Plan: SNF in 24 to 48 hours.    Consultants:  none  Procedures:  none  Antibiotics:  Zosyn 9-15---9-17 ceftriaxone 9-17---9-18  Vancomycin for one day.   Oral vancomycin.    HPI/Subjective: Feeling better. Denies abdominal pain.   Objective: Filed Vitals:   04/17/15 1329  BP: 111/69  Pulse: 102  Temp: 97.4 F (36.3 C)  Resp: 16    Intake/Output Summary (Last 24 hours) at 04/17/15 1347 Last data filed at 04/17/15 1300  Gross per 24 hour  Intake 5757.27 ml  Output   6150 ml  Net -392.73 ml   Filed Weights   04/14/15 1846 04/16/15 0400 04/16/15 1230  Weight: 74 kg (163 lb 2.3 oz) 73.1 kg (161 lb 2.5 oz) 74.39 kg (164 lb)    Exam:   General:  Alert in no distress  Cardiovascular: S 1, S 2 RRR  Respiratory: Bilateral ronchus/   Abdomen: BS present, soft, nt  Musculoskeletal: no edema  Data Reviewed: Basic Metabolic Panel:  Recent Labs Lab 04/14/15 1221 04/15/15 0400 04/16/15 0907 04/17/15 0340  NA 137 142 143 140  K 4.5 4.3 3.7 3.9  CL 101 111 113* 109  CO2 25 25 23 22   GLUCOSE 126* 108* 82 80  BUN 23* 17 13 10   CREATININE 2.56* 2.26* 1.97* 1.68*  CALCIUM 9.2 8.2* 8.1* 8.1*   Liver Function Tests:  Recent Labs Lab 04/14/15 1221 04/15/15 0400  AST 18 11*  ALT 15* 12*  ALKPHOS 94 82  BILITOT 0.6 0.9  PROT 8.1 6.3*  ALBUMIN 3.3* 2.4*   No results for input(s): LIPASE, AMYLASE in the last 168 hours. No results for input(s): AMMONIA in the last 168 hours. CBC:  Recent Labs Lab 04/14/15 1221 04/15/15 0400 04/16/15 0907 04/17/15 0340  WBC 17.2* 13.4* 8.8 8.4  NEUTROABS 15.3*  --   --   --  HGB 12.0* 10.0* 10.0* 10.2*  HCT 37.7* 31.0* 31.7* 32.3*  MCV 82.9 82.2 83.6 83.5  PLT 327 274 307 351   Cardiac Enzymes: No results for input(s): CKTOTAL, CKMB, CKMBINDEX, TROPONINI in the last 168 hours. BNP (last 3 results) No results for input(s): BNP in the last 8760 hours.  ProBNP (last 3 results) No results for input(s): PROBNP in the last 8760 hours.  CBG: No results for input(s): GLUCAP in the last 168 hours.  Recent Results (from the past 240 hour(s))  Culture, blood (routine x 2)     Status: None  (Preliminary result)   Collection Time: 04/14/15 12:21 PM  Result Value Ref Range Status   Specimen Description BLOOD LEFT ANTECUBITAL  Final   Special Requests BOTTLES DRAWN AEROBIC AND ANAEROBIC 5CC  Final   Culture   Final    NO GROWTH 3 DAYS Performed at [facility]    Report Status PENDING  Incomplete  Culture, blood (routine x 2)     Status: None (Preliminary result)   Collection Time: 04/14/15 12:47 PM  Result Value Ref Range Status   Specimen Description BLOOD LEFT FOREARM  Final   Special Requests BOTTLES DRAWN AEROBIC AND ANAEROBIC 5CC  Final   Culture   Final    NO GROWTH 3 DAYS Performed at [facility]    Report Status PENDING  Incomplete  Urine culture     Status: None   Collection Time: 04/14/15  2:47 PM  Result Value Ref Range Status   Specimen Description URINE, CATHETERIZED  Final   Special Requests NONE  Final   Culture   Final    >=100,000 COLONIES/mL SERRATIA MARCESCENS Performed at [facility]    Report Status 04/16/2015 FINAL  Final   Organism ID, Bacteria SERRATIA MARCESCENS  Final      Susceptibility   Serratia marcescens - MIC*    CEFAZOLIN >=64 RESISTANT Resistant     CEFTRIAXONE <=1 SENSITIVE Sensitive     CIPROFLOXACIN <=0.25 SENSITIVE Sensitive     GENTAMICIN <=1 SENSITIVE Sensitive     NITROFURANTOIN 128 RESISTANT Resistant     TRIMETH/SULFA <=20 SENSITIVE Sensitive     * >=100,000 COLONIES/mL SERRATIA MARCESCENS     Studies: No results found.  Scheduled Meds: . antiseptic oral rinse  7 mL Mouth Rinse BID  . benztropine  0.5 mg Oral q morning - 10a  . cefTRIAXone (ROCEPHIN)  IV  1 g Intravenous Q24H  . cloZAPine  800 mg Oral QHS  . enoxaparin (LOVENOX) injection  40 mg Subcutaneous Q24H  . nicotine  14 mg Transdermal Daily  . tamsulosin  0.4 mg Oral QPC supper  . vancomycin  125 mg Oral QID   Continuous Infusions: . sodium chloride 50 mL/hr at 04/17/15 2993    Principal Problem:   Sepsis Active  Problems:   Sepsis secondary to UTI   Paranoid schizophrenia, chronic condition   AKI (acute kidney injury)   Acute encephalopathy   Enteritis due to Clostridium difficile    Time spent: 35 minutes.     Niel Hummer A  [facility] Pager [phone]. If 7PM-7AM, please contact night-coverage at [email or web address], password [facility] 04/17/2015, 1:47 PM  LOS: 3 days

## 2015-04-18 LAB — BASIC METABOLIC PANEL
ANION GAP: 10 (ref 5–15)
BUN: 9 mg/dL (ref 6–20)
CALCIUM: 8.5 mg/dL — AB (ref 8.9–10.3)
CO2: 22 mmol/L (ref 22–32)
Chloride: 107 mmol/L (ref 101–111)
Creatinine, Ser: 1.64 mg/dL — ABNORMAL HIGH (ref 0.61–1.24)
GFR, EST AFRICAN AMERICAN: 52 mL/min — AB (ref >60)
GFR, EST NON AFRICAN AMERICAN: 45 mL/min — AB (ref >60)
GLUCOSE: 82 mg/dL (ref 65–99)
POTASSIUM: 3.5 mmol/L (ref 3.5–5.1)
Sodium: 139 mmol/L (ref 135–145)

## 2015-04-18 LAB — CBC
HEMATOCRIT: 36.5 % — AB (ref 39.0–52.0)
Hemoglobin: 11.8 g/dL — ABNORMAL LOW (ref 13.0–17.0)
MCH: 26.9 pg (ref 26.0–34.0)
MCHC: 32.3 g/dL (ref 30.0–36.0)
MCV: 83.1 fL (ref 78.0–100.0)
Platelets: 325 10*3/uL (ref 150–400)
RBC: 4.39 MIL/uL (ref 4.22–5.81)
RDW: 16.6 % — ABNORMAL HIGH (ref 11.5–15.5)
WBC: 5.1 10*3/uL (ref 4.0–10.5)

## 2015-04-18 MED ORDER — NICOTINE 14 MG/24HR TD PT24: 14.0000 mg | MEDICATED_PATCH | Freq: Every day | TRANSDERMAL | Status: DC

## 2015-04-18 MED ORDER — SULFAMETHOXAZOLE-TRIMETHOPRIM 800-160 MG PO TABS: 1.0000 | ORAL_TABLET | Freq: Two times a day (BID) | ORAL | Status: DC

## 2015-04-18 MED ORDER — VANCOMYCIN 50 MG/ML ORAL SOLUTION: 125.0000 mg | Freq: Four times a day (QID) | ORAL | Status: DC

## 2015-04-18 NOTE — Progress Notes (Signed)
Pt for return to [facility].  CSW facilitated pt discharge needs including contacting facility, faxing pt discharge information via [facility], discussing with pt brother, [name] via telephone and pt at bedside, providing RN phone number to call report, and providing RN with discharge packet to provide to pt brother. Pt brother plans to transport pt via private vehicle.   No further social work needs identified at this time.  CSW signing off.   Alison Murray, MSW, [place] Work [phone]

## 2015-04-18 NOTE — Progress Notes (Signed)
Occupational Therapy Treatment Patient Details Name: Edwin [name] MRN: [id number] DOB: [birth year] Today's Date: 04/18/2015    History of present illness Pt was at [facility] for rehab.  Admitted for sepsis from uti.  H/O schizophrenia, prostate CA, catheter   OT comments  Pt with good participation           Precautions / Restrictions Precautions Precautions: Fall Restrictions Weight Bearing Restrictions: No       Mobility Bed Mobility Overal bed mobility: Needs Assistance Bed Mobility: Supine to Sit     Supine to sit: Supervision     General bed mobility comments: manage lines; for safety  Transfers Overall transfer level: Needs assistance Equipment used: Rolling walker (2 wheeled) Transfers: Sit to/from Stand Sit to Stand: Min guard         General transfer comment: cues for safety, use of RW    Balance     Sitting balance-Leahy Scale: Good       Standing balance-Leahy Scale: Fair                     ADL Overall ADL's : Needs assistance/impaired     Grooming: Sitting;Brushing hair;Cueing for safety               Lower Body Dressing: Minimal assistance;Sit to/from stand                 General ADL Comments: Encouraged pt to speak up when ordering lunch.  Pt stated he liked to draw. OT provided pt paper and a pencil/      Vision                            Cognition   Behavior During Therapy: Flat affect Overall Cognitive Status: Within Functional Limits for tasks assessed                       Extremity/Trunk Assessment  Upper Extremity Assessment Upper Extremity Assessment: Generalized weakness            Exercises             Pertinent Vitals/ Pain       Pain Assessment: No/denies pain     Prior Functioning/Environment              Frequency Min 2X/week     Progress Toward Goals  OT Goals(current goals can now be found in the care plan section)  Progress towards OT  goals: Progressing toward goals  Acute Rehab OT Goals Patient Stated Goal: none stated  Plan      Co-evaluation    PT/OT/SLP Co-Evaluation/Treatment: Yes Reason for Co-Treatment: For patient/therapist safety   OT goals addressed during session: ADL's and self-care      End of Session Equipment Utilized During Treatment: Rolling walker   Activity Tolerance Patient tolerated treatment well   Patient Left in chair with chair alarm and nurse call bell   Nurse Communication Mobility status        Time: 4098-1191 OT Time Calculation (min): 23 min  Charges: OT General Charges $OT Visit: 1 Procedure OT Treatments $Self Care/Home Management : 8-22 mins  Titan Karner, Thereasa Parkin 04/18/2015, 12:10 PM

## 2015-04-18 NOTE — Progress Notes (Signed)
Physical Therapy Treatment Patient Details Name: RAZIEL KOENIGS MRN: [id number] DOB: 1955/09/15 Today's Date: 2015/05/09    History of Present Illness Pt was at [facility] for rehab.  Admitted for sepsis from uti.  H/O schizophrenia, prostate CA, catheter    PT Comments    Pt progressing, requires encouragement to participate; will benefit from continued therapyat SNF level to incr independence for return home  Follow Up Recommendations  SNF;Supervision/Assistance - 24 hour     Equipment Recommendations  None recommended by PT    Recommendations for Other Services       Precautions / Restrictions Precautions Precautions: Fall Restrictions Weight Bearing Restrictions: No    Mobility  Bed Mobility Overal bed mobility: Needs Assistance Bed Mobility: Supine to Sit     Supine to sit: Supervision     General bed mobility comments: manage lines; for safety  Transfers Overall transfer level: Needs assistance Equipment used: Rolling walker (2 wheeled) Transfers: Sit to/from Stand Sit to Stand: Min guard         General transfer comment: cues for safety, use of RW  Ambulation/Gait Ambulation/Gait assistance: Min guard;Supervision Ambulation Distance (Feet): 400 Feet Assistive device: Rolling walker (2 wheeled) Gait Pattern/deviations: Step-through pattern Gait velocity: decreased   General Gait Details: cues and intermittent assist for maneuvering RW   Stairs            Wheelchair Mobility    Modified Rankin (Stroke Patients Only)       Balance     Sitting balance-Leahy Scale: Good       Standing balance-Leahy Scale: Fair                      Cognition Arousal/Alertness: Awake/alert Behavior During Therapy: Flat affect Overall Cognitive Status: Within Functional Limits for tasks assessed                      Exercises      General Comments        Pertinent Vitals/Pain Pain Assessment: No/denies pain    Home  Living                      Prior Function            PT Goals (current goals can now be found in the care plan section) Acute Rehab PT Goals Patient Stated Goal: none stated PT Goal Formulation: With patient Time For Goal Achievement: 04/30/15 Potential to Achieve Goals: Good Progress towards PT goals: Progressing toward goals    Frequency  Min 3X/week    PT Plan Current plan remains appropriate    Co-evaluation             End of Session Equipment Utilized During Treatment: Gait belt Activity Tolerance: Patient tolerated treatment well Patient left: in chair;with chair alarm set;with call bell/phone within reach     Time: 1130-1143 PT Time Calculation (min) (ACUTE ONLY): 13 min  Charges:  $Gait Training: 8-22 mins                    G Codes:      WILLIAMS,TARA 2015/05/09, 11:56 AM

## 2015-04-18 NOTE — Progress Notes (Signed)
Patient discharged to SNF, copies of discharge medications and instructions to be sent with pt's brother to facility.  Pt's brother to transport pt.

## 2015-04-18 NOTE — Care Management Important Message (Signed)
Important Message  Patient Details IM Letter given to Nora/Case Manager to present to Patient.Important Message  Patient Details  Name: Edwin [name] MRN: [id number] Date of Birth: [birth year]   Medicare Important Message Given:  Yes-second notification given    Camillo Flaming 04/18/2015, 10:46 AM Name: Edwin [name] MRN: [id number] Date of Birth: Apr 09, 1956   Medicare Important Message Given:  Yes-second notification given    Camillo Flaming 04/18/2015, 10:46 AM

## 2015-04-18 NOTE — Discharge Summary (Signed)
Physician Discharge Summary  Edwin [name] TKW:[id number] DOB: [birth year] DOA: 04/14/2015  PCP: [facility]  Admit date: 04/14/2015 Discharge date: 04/18/2015  Time spent: 35 minutes  Recommendations for Outpatient Follow-up:  1. Needs B-met to follow renal function and potasium level, due to patient being Taking Bactrim.  2. Needs to take Vancomycin, extend course one more week after he finish antibiotics for UTI.  3. Needs to complete 9 more days of antibiotics for UTI.  4. Needs to follow up with urology.   Discharge Diagnoses:    Sepsis   Sepsis secondary to UTI   Paranoid schizophrenia, chronic condition   AKI (acute kidney injury)   Acute encephalopathy   Enteritis due to Clostridium difficile   Discharge Condition: stable  Diet recommendation: heart healthy  Filed Weights   04/14/15 1846 04/16/15 0400 04/16/15 1230  Weight: 74 kg (163 lb 2.3 oz) 73.1 kg (161 lb 2.5 oz) 74.39 kg (164 lb)    History of present illness:  Edwin [name] is a 59 y.o. male with PMH significant for prostate cancer, chronic foley catheter, paranoid schizophrenia, recently admitted to hospital from 9-05 until 9-08 for Sepsis, C diff, Renal failure. Patient went to his urology office today for follow up. Patient was found to have abnormal vitals, tachycardia and hypotension. He was refer to the ED. Patient provide minimal history. He is alert , follows command, slow in answering questions. This his baseline per brother.  Patient relates he is still having diarrhea. He has not been eating well, the " food Smell". His foley catheter has been leaking urine, per nurse report. ED nurse exchange foley, after exchanging foley significant amount of urine drain.  Patient denies abdominal pain, chest pain or dyspnea. Report mild cough.   Evaluation in the ED; WBC 17, Cr 2.56, BUN 23, UA with too numerous to count WBC. Chest x ray: no edema or consolidation.   Hospital Course:  1-Sepsis: secondary  to UTI. presents with hypotension, tachycardia, lactic acid at 2, leukocytosis.  -Chest x ray negative, vancomycin IV was discontinue.  -Chest x ray negative, source probably UTI.  -Recent history of C diff. Continue with oral vancomycin.  -Lactic acid trending down.  -Blood culture, urine culture Growing Serratia Marcescens, sensitive to Bactrim, cipro, ceftriaxone.  -he will be discharge on 9 more days of Bactrim to complete 2 weeks of antibiotics.   2-UTI; in setting of chronic foley catheter.  -UA with too numerous to count WBC. -antibiotics day 5. He will need treatment for 2 weeks antibiotics. Bactrim.  - He has chronic foley catheter that is leaking. Foley exchange in the ED 9-15.  - urine culture growing serratia.  Discharge on 9 more days of antibiotics. Bactrim. Please monitor renal function.   3-Acute on chronic renal failure:  -Suspect worsening renal function in setting of hypotension and obstructive neprhopathy.  -nurse exchange foley 9-15, good urine out put after foley exchange.  -IV fluids. Cr trending down. Good urine out put . -improving. Monitor on Bactrim. Using Cipro will increase chances for C diff.  -needs to monitor renal function on Bactrim  4-C diff colitis; will continue oral vancomycin. He will need long course. Would continue oral vancomycin 1 more week after stopping Bactrim.  No diarrhea reported.   5-schizophrenia; continue with currents medications.   6-prostate cancer; was refer o hospital from urology office. Needs to follow up with urology.   7-Encephalopathy; In setting of acute illness. Monitor. Improved. Alert , following command.  Oriented to place and situation. Resolved.  Procedures:  none  Consultations:  none  Discharge Exam: Filed Vitals:   04/18/15 0433  BP: 119/81  Pulse: 99  Temp: 97.7 F (36.5 C)  Resp: 16    General: NAD Cardiovascular: *S 1, S 2 RRR Respiratory: CTA  Discharge  Instructions   Discharge Instructions    Diet - low sodium heart healthy    Complete by:  As directed      Increase activity slowly    Complete by:  As directed           Current Discharge Medication List    START taking these medications   Details  nicotine (NICODERM CQ - DOSED IN MG/24 HOURS) 14 mg/24hr patch Place 1 patch (14 mg total) onto the skin daily. Qty: 28 patch, Refills: 0    sulfamethoxazole-trimethoprim (BACTRIM DS,SEPTRA DS) 800-160 MG per tablet Take 1 tablet by mouth every 12 (twelve) hours. Qty: 18 tablet, Refills: 0      CONTINUE these medications which have CHANGED   Details  vancomycin (VANCOCIN) 50 mg/mL oral solution Take 2.5 mLs (125 mg total) by mouth 4 (four) times daily. For 9 additional days Qty: 100 mL, Refills: 1      CONTINUE these medications which have NOT CHANGED   Details  acetaminophen (TYLENOL) 500 MG tablet Take 1,000 mg by mouth every 4 (four) hours as needed for moderate pain or headache.    benztropine (COGENTIN) 0.5 MG tablet Take 0.5 mg by mouth every morning. Refills: 4    cloZAPine (CLOZARIL) 100 MG tablet Take 800 mg by mouth at bedtime.    simvastatin (ZOCOR) 20 MG tablet Take 20 mg by mouth daily. Refills: 3    tamsulosin (FLOMAX) 0.4 MG CAPS capsule Take 1 capsule (0.4 mg total) by mouth daily after supper. Qty: 30 capsule, Refills: 0       No Known Allergies Follow-up Information    Follow up with [facility].   Specialty:  Internal Medicine   Contact information:   [address] [place] [address] [phone]       Follow up with Arvil Persons, MD In 2 weeks.   Specialty:  Urology   Contact information:   [place] [address] [phone]        The results of significant diagnostics from this hospitalization (including imaging, microbiology, ancillary and laboratory) are listed below for reference.    Significant Diagnostic Studies: Dg Chest 2 View  04/14/2015   CLINICAL  DATA:  Weakness and tachycardia.  Concern for sepsis  EXAM: CHEST  2 VIEW  COMPARISON:  March 31, 2015  FINDINGS: There is no edema or consolidation. Heart size and pulmonary vascularity are normal. No adenopathy. There is degenerative change in the thoracic spine.  IMPRESSION: No edema or consolidation.   Electronically Signed   By: Lowella Grip III M.D.   On: 04/14/2015 14:03   Dg Abd 1 View  04/03/2015   CLINICAL DATA:  Abdominal pain and distention.  EXAM: ABDOMEN - 1 VIEW  COMPARISON:  02/18/2015 CT  FINDINGS: Gas in the colon again noted. A possible mildly distended loop of small bowel within the right abdomen is nonspecific.  No suspicious calcifications are identified.  No acute bony abnormalities are noted.  IMPRESSION: Nonspecific nonobstructive bowel gas pattern.   Electronically Signed   By: Margarette Canada M.D.   On: 04/03/2015 11:17   Ct Head Wo Contrast  03/31/2015  CLINICAL DATA:  Altered mental status and hypotension.  EXAM: CT HEAD WITHOUT CONTRAST  TECHNIQUE: Contiguous axial images were obtained from the base of the skull through the vertex without intravenous contrast.  COMPARISON:  Head CT scan 01/13/2015.  FINDINGS: The brain appears normal without hemorrhage, infarct, mass lesion, mass effect, midline shift or abnormal extra-axial fluid collection. No hydrocephalus or pneumocephalus. Imaged paranasal sinuses and mastoid air cells are clear. The calvarium is intact. Tiny locules of gas in soft tissues of the upper neck in right base are likely related to venous gas access.  IMPRESSION: No acute abnormality.  Negative exam.   Electronically Signed   By: Inge Rise M.D.   On: 03/31/2015 17:12   US Abdomen Limited  04/04/2015   CLINICAL DATA:  Abdominal distention.  EXAM: LIMITED ABDOMEN ULTRASOUND FOR ASCITES  TECHNIQUE: Limited ultrasound survey for ascites was performed in all four abdominal quadrants.  COMPARISON:  Renal ultrasound 03/31/2015  FINDINGS: Targeted ultrasound is  performed of the 4 quadrants determine presence or absence of ascites. No significant ascites is identified. Incidental note is made of bilateral hydronephrosis, previously documented.  IMPRESSION: No significant ascites.   Electronically Signed   By: Nolon Nations M.D.   On: 04/04/2015 20:39   US Renal Port  03/31/2015   CLINICAL DATA:  Renal failure.  EXAM: RENAL / URINARY TRACT ULTRASOUND COMPLETE  COMPARISON:  CT, 02/18/2015  FINDINGS: Right Kidney:  Length: 16 cm. Renal sinus cysts versus, more likely, moderate hydronephrosis. The renal pelvis was not extrarenal pelvis is significantly dilated. No solid renal masses. No stones.  Left Kidney:  Length: 16.5 cm. Marked hydronephrosis, particularly distention of the renal pelvis. No solid renal masses.  Bladder:  Wall is thickened and irregular. There is dependent debris. No discrete bladder mass. Bladder is distended. There is an inserted falling, but the balloon is not evident in the bladder. It appears to reside in the prostatic urethra.  Prostate is enlarged measuring 6.7 x 6.1 x 6.3 cm.  IMPRESSION: 1. Findings are similar to the prior CT. There is significant dilation of both renal pelvises and the intrarenal collecting systems, left greater than right. Bladder shows irregular thickened wall and dependent debris. Hydronephrosis is likely on the basis of bladder outlet obstruction. 2. Foley catheter has been inserted but does not enter the bladder. Tip appears to be in the prostatic urethra.   Electronically Signed   By: Lajean Manes M.D.   On: 03/31/2015 20:14   Dg Chest Port 1 View  03/31/2015   CLINICAL DATA:  Weakness with altered mental status  EXAM: PORTABLE CHEST - 1 VIEW  COMPARISON:  January 13, 2015  FINDINGS: There is no edema or consolidation. The heart size and pulmonary vascularity normal. No adenopathy. There is postoperative change in the lower cervical spine.  IMPRESSION: No edema or consolidation.   Electronically Signed   By: Lowella Grip III M.D.   On: 03/31/2015 15:46    Microbiology: Recent Results (from the past 240 hour(s))  Culture, blood (routine x 2)     Status: None (Preliminary result)   Collection Time: 04/14/15 12:21 PM  Result Value Ref Range Status   Specimen Description BLOOD LEFT ANTECUBITAL  Final   Special Requests BOTTLES DRAWN AEROBIC AND ANAEROBIC 5CC  Final   Culture   Final    NO GROWTH 3 DAYS Performed at [facility]    Report Status PENDING  Incomplete  Culture, blood (routine x 2)  Status: None (Preliminary result)   Collection Time: 04/14/15 12:47 PM  Result Value Ref Range Status   Specimen Description BLOOD LEFT FOREARM  Final   Special Requests BOTTLES DRAWN AEROBIC AND ANAEROBIC 5CC  Final   Culture   Final    NO GROWTH 3 DAYS Performed at [facility]    Report Status PENDING  Incomplete  Urine culture     Status: None   Collection Time: 04/14/15  2:47 PM  Result Value Ref Range Status   Specimen Description URINE, CATHETERIZED  Final   Special Requests NONE  Final   Culture   Final    >=100,000 COLONIES/mL SERRATIA MARCESCENS Performed at [facility]    Report Status 04/16/2015 FINAL  Final   Organism ID, Bacteria SERRATIA MARCESCENS  Final      Susceptibility   Serratia marcescens - MIC*    CEFAZOLIN >=64 RESISTANT Resistant     CEFTRIAXONE <=1 SENSITIVE Sensitive     CIPROFLOXACIN <=0.25 SENSITIVE Sensitive     GENTAMICIN <=1 SENSITIVE Sensitive     NITROFURANTOIN 128 RESISTANT Resistant     TRIMETH/SULFA <=20 SENSITIVE Sensitive     * >=100,000 COLONIES/mL SERRATIA MARCESCENS     Labs: Basic Metabolic Panel:  Recent Labs Lab 04/14/15 1221 04/15/15 0400 04/16/15 0907 04/17/15 0340 04/18/15 0436  NA 137 142 143 140 139  K 4.5 4.3 3.7 3.9 3.5  CL 101 111 113* 109 107  CO2 25 25 23 22 22   GLUCOSE 126* 108* 82 80 82  BUN 23* 17 13 10 9   CREATININE 2.56* 2.26* 1.97* 1.68* 1.64*  CALCIUM 9.2 8.2* 8.1* 8.1* 8.5*   Liver  Function Tests:  Recent Labs Lab 04/14/15 1221 04/15/15 0400  AST 18 11*  ALT 15* 12*  ALKPHOS 94 82  BILITOT 0.6 0.9  PROT 8.1 6.3*  ALBUMIN 3.3* 2.4*   No results for input(s): LIPASE, AMYLASE in the last 168 hours. No results for input(s): AMMONIA in the last 168 hours. CBC:  Recent Labs Lab 04/14/15 1221 04/15/15 0400 04/16/15 0907 04/17/15 0340 04/18/15 0436  WBC 17.2* 13.4* 8.8 8.4 5.1  NEUTROABS 15.3*  --   --   --   --   HGB 12.0* 10.0* 10.0* 10.2* 11.8*  HCT 37.7* 31.0* 31.7* 32.3* 36.5*  MCV 82.9 82.2 83.6 83.5 83.1  PLT 327 274 307 351 325   Cardiac Enzymes: No results for input(s): CKTOTAL, CKMB, CKMBINDEX, TROPONINI in the last 168 hours. BNP: BNP (last 3 results) No results for input(s): BNP in the last 8760 hours.  ProBNP (last 3 results) No results for input(s): PROBNP in the last 8760 hours.  CBG: No results for input(s): GLUCAP in the last 168 hours.     SignedNiel Hummer A  [facility] 04/18/2015, 8:46 AM

## 2015-04-19 LAB — CULTURE, BLOOD (ROUTINE X 2)
CULTURE: NO GROWTH
Culture: NO GROWTH

## 2015-05-27 ENCOUNTER — Other Ambulatory Visit: Payer: Self-pay | Admitting: Urology

## 2015-06-03 ENCOUNTER — Encounter (HOSPITAL_BASED_OUTPATIENT_CLINIC_OR_DEPARTMENT_OTHER): Payer: Self-pay | Admitting: *Deleted

## 2015-06-06 ENCOUNTER — Encounter (HOSPITAL_BASED_OUTPATIENT_CLINIC_OR_DEPARTMENT_OTHER): Payer: Self-pay | Admitting: *Deleted

## 2015-06-06 NOTE — Progress Notes (Signed)
SPOKE W/ BROTHER,  PT HAS PARANOID SCHIZPHRENIA, NEED HIM PRE-OP.  NPO AFTER MN.  ARRIVE AT 0900.  NEEDS ISTAT 8.

## 2015-06-10 ENCOUNTER — Ambulatory Visit (HOSPITAL_BASED_OUTPATIENT_CLINIC_OR_DEPARTMENT_OTHER): Payer: Medicare Other | Admitting: Anesthesiology

## 2015-06-10 ENCOUNTER — Inpatient Hospital Stay (HOSPITAL_BASED_OUTPATIENT_CLINIC_OR_DEPARTMENT_OTHER)
Admission: AD | Admit: 2015-06-10 | Discharge: 2015-06-17 | DRG: 709 | Disposition: A | Discharge status: Home / Self Care | Payer: Medicare Other | Source: Ambulatory Visit | Attending: Urology | Admitting: Urology

## 2015-06-10 ENCOUNTER — Encounter (HOSPITAL_BASED_OUTPATIENT_CLINIC_OR_DEPARTMENT_OTHER): Payer: Self-pay | Admitting: Anesthesiology

## 2015-06-10 ENCOUNTER — Inpatient Hospital Stay (HOSPITAL_COMMUNITY): Payer: Medicare Other

## 2015-06-10 ENCOUNTER — Ambulatory Visit (HOSPITAL_COMMUNITY): Payer: Medicare Other

## 2015-06-10 ENCOUNTER — Encounter (HOSPITAL_COMMUNITY)
Admission: AD | Disposition: A | Discharge status: Home / Self Care | Payer: Self-pay | Source: Ambulatory Visit | Attending: Urology

## 2015-06-10 DIAGNOSIS — F329 Major depressive disorder, single episode, unspecified: Secondary | ICD-10-CM | POA: Diagnosis present

## 2015-06-10 DIAGNOSIS — F200 Paranoid schizophrenia: Secondary | ICD-10-CM | POA: Diagnosis present

## 2015-06-10 DIAGNOSIS — G92 Toxic encephalopathy: Secondary | ICD-10-CM | POA: Diagnosis not present

## 2015-06-10 DIAGNOSIS — F17210 Nicotine dependence, cigarettes, uncomplicated: Secondary | ICD-10-CM | POA: Diagnosis present

## 2015-06-10 DIAGNOSIS — D638 Anemia in other chronic diseases classified elsewhere: Secondary | ICD-10-CM | POA: Diagnosis present

## 2015-06-10 DIAGNOSIS — R579 Shock, unspecified: Secondary | ICD-10-CM | POA: Diagnosis not present

## 2015-06-10 DIAGNOSIS — E440 Moderate protein-calorie malnutrition: Secondary | ICD-10-CM | POA: Insufficient documentation

## 2015-06-10 DIAGNOSIS — N390 Urinary tract infection, site not specified: Secondary | ICD-10-CM | POA: Diagnosis present

## 2015-06-10 DIAGNOSIS — N1330 Unspecified hydronephrosis: Secondary | ICD-10-CM | POA: Diagnosis present

## 2015-06-10 DIAGNOSIS — N3080 Other cystitis without hematuria: Secondary | ICD-10-CM | POA: Diagnosis present

## 2015-06-10 DIAGNOSIS — N320 Bladder-neck obstruction: Secondary | ICD-10-CM | POA: Diagnosis present

## 2015-06-10 DIAGNOSIS — E785 Hyperlipidemia, unspecified: Secondary | ICD-10-CM | POA: Diagnosis present

## 2015-06-10 DIAGNOSIS — N183 Chronic kidney disease, stage 3 (moderate): Secondary | ICD-10-CM | POA: Diagnosis present

## 2015-06-10 DIAGNOSIS — R6521 Severe sepsis with septic shock: Secondary | ICD-10-CM | POA: Diagnosis not present

## 2015-06-10 DIAGNOSIS — Z6821 Body mass index (BMI) 21.0-21.9, adult: Secondary | ICD-10-CM

## 2015-06-10 DIAGNOSIS — R338 Other retention of urine: Secondary | ICD-10-CM | POA: Diagnosis present

## 2015-06-10 DIAGNOSIS — R631 Polydipsia: Secondary | ICD-10-CM | POA: Diagnosis not present

## 2015-06-10 DIAGNOSIS — E46 Unspecified protein-calorie malnutrition: Secondary | ICD-10-CM | POA: Diagnosis present

## 2015-06-10 DIAGNOSIS — E876 Hypokalemia: Secondary | ICD-10-CM | POA: Diagnosis present

## 2015-06-10 DIAGNOSIS — R0603 Acute respiratory distress: Secondary | ICD-10-CM

## 2015-06-10 DIAGNOSIS — N401 Enlarged prostate with lower urinary tract symptoms: Secondary | ICD-10-CM | POA: Diagnosis present

## 2015-06-10 DIAGNOSIS — N189 Chronic kidney disease, unspecified: Secondary | ICD-10-CM

## 2015-06-10 DIAGNOSIS — A419 Sepsis, unspecified organism: Secondary | ICD-10-CM | POA: Diagnosis present

## 2015-06-10 DIAGNOSIS — R739 Hyperglycemia, unspecified: Secondary | ICD-10-CM

## 2015-06-10 DIAGNOSIS — N179 Acute kidney failure, unspecified: Secondary | ICD-10-CM | POA: Diagnosis present

## 2015-06-10 DIAGNOSIS — Z538 Procedure and treatment not carried out for other reasons: Secondary | ICD-10-CM | POA: Diagnosis not present

## 2015-06-10 DIAGNOSIS — K219 Gastro-esophageal reflux disease without esophagitis: Secondary | ICD-10-CM | POA: Diagnosis present

## 2015-06-10 HISTORY — DX: Chronic kidney disease, stage 3 unspecified: N18.30

## 2015-06-10 HISTORY — DX: Personal history of other infectious and parasitic diseases: Z86.19

## 2015-06-10 HISTORY — DX: Hyperlipidemia, unspecified: E78.5

## 2015-06-10 HISTORY — PX: CYSTOSCOPY: SHX5120

## 2015-06-10 HISTORY — DX: Chronic kidney disease, stage 3 (moderate): N18.3

## 2015-06-10 LAB — HEPATIC FUNCTION PANEL
ALBUMIN: 1.9 g/dL — AB (ref 3.5–5.0)
ALK PHOS: 83 U/L (ref 38–126)
ALT: 17 U/L (ref 17–63)
AST: 16 U/L (ref 15–41)
Bilirubin, Direct: <0.1 mg/dL — ABNORMAL LOW (ref 0.1–0.5)
TOTAL PROTEIN: 5.5 g/dL — AB (ref 6.5–8.1)
Total Bilirubin: 0.3 mg/dL (ref 0.3–1.2)

## 2015-06-10 LAB — CBC WITH DIFFERENTIAL/PLATELET
BASOS PCT: 0 %
Basophils Absolute: 0 10*3/uL (ref 0.0–0.1)
EOS ABS: 0 10*3/uL (ref 0.0–0.7)
Eosinophils Relative: 0 %
HCT: 29.5 % — ABNORMAL LOW (ref 39.0–52.0)
Hemoglobin: 9.7 g/dL — ABNORMAL LOW (ref 13.0–17.0)
Lymphocytes Relative: 3 %
Lymphs Abs: 0.5 10*3/uL — ABNORMAL LOW (ref 0.7–4.0)
MCH: 25.8 pg — ABNORMAL LOW (ref 26.0–34.0)
MCHC: 32.9 g/dL (ref 30.0–36.0)
MCV: 78.5 fL (ref 78.0–100.0)
MONO ABS: 1.1 10*3/uL — AB (ref 0.1–1.0)
MONOS PCT: 6 %
NEUTROS PCT: 91 %
Neutro Abs: 17.6 10*3/uL — ABNORMAL HIGH (ref 1.7–7.7)
Platelets: 606 10*3/uL — ABNORMAL HIGH (ref 150–400)
RBC: 3.76 MIL/uL — ABNORMAL LOW (ref 4.22–5.81)
RDW: 17.5 % — AB (ref 11.5–15.5)
WBC: 19.3 10*3/uL — ABNORMAL HIGH (ref 4.0–10.5)

## 2015-06-10 LAB — BASIC METABOLIC PANEL
Anion gap: 11 (ref 5–15)
BUN: 29 mg/dL — ABNORMAL HIGH (ref 6–20)
CALCIUM: 8.1 mg/dL — AB (ref 8.9–10.3)
CO2: 20 mmol/L — ABNORMAL LOW (ref 22–32)
CREATININE: 3.13 mg/dL — AB (ref 0.61–1.24)
Chloride: 103 mmol/L (ref 101–111)
GFR, EST AFRICAN AMERICAN: 24 mL/min — AB (ref >60)
GFR, EST NON AFRICAN AMERICAN: 21 mL/min — AB (ref >60)
Glucose, Bld: 129 mg/dL — ABNORMAL HIGH (ref 65–99)
Potassium: 3.2 mmol/L — ABNORMAL LOW (ref 3.5–5.1)
SODIUM: 134 mmol/L — AB (ref 135–145)

## 2015-06-10 LAB — POCT I-STAT, CHEM 8
BUN: 26 mg/dL — ABNORMAL HIGH (ref 6–20)
CALCIUM ION: 1.16 mmol/L (ref 1.12–1.23)
Chloride: 100 mmol/L — ABNORMAL LOW (ref 101–111)
Creatinine, Ser: 3.2 mg/dL — ABNORMAL HIGH (ref 0.61–1.24)
Glucose, Bld: 152 mg/dL — ABNORMAL HIGH (ref 65–99)
HCT: 36 % — ABNORMAL LOW (ref 39.0–52.0)
Hemoglobin: 12.2 g/dL — ABNORMAL LOW (ref 13.0–17.0)
Potassium: 3.1 mmol/L — ABNORMAL LOW (ref 3.5–5.1)
SODIUM: 134 mmol/L — AB (ref 135–145)
TCO2: 19 mmol/L (ref 0–100)

## 2015-06-10 LAB — SALICYLATE LEVEL: Salicylate Lvl: <4 mg/dL (ref 2.8–30.0)

## 2015-06-10 LAB — HEMOGLOBIN AND HEMATOCRIT, BLOOD
HCT: 26.2 % — ABNORMAL LOW (ref 39.0–52.0)
HEMOGLOBIN: 8.3 g/dL — AB (ref 13.0–17.0)

## 2015-06-10 LAB — PROCALCITONIN: Procalcitonin: 0.64 ng/mL

## 2015-06-10 LAB — MRSA PCR SCREENING: MRSA by PCR: NEGATIVE

## 2015-06-10 LAB — TYPE AND SCREEN
ABO/RH(D): A POS
Antibody Screen: NEGATIVE

## 2015-06-10 LAB — LACTIC ACID, PLASMA
Lactic Acid, Venous: 0.5 mmol/L (ref 0.5–2.0)
Lactic Acid, Venous: 0.8 mmol/L (ref 0.5–2.0)

## 2015-06-10 LAB — MAGNESIUM: Magnesium: 1.8 mg/dL (ref 1.7–2.4)

## 2015-06-10 LAB — CORTISOL: Cortisol, Plasma: 10.1 ug/dL

## 2015-06-10 LAB — TROPONIN I: Troponin I: <0.03 ng/mL (ref <0.031)

## 2015-06-10 LAB — GLUCOSE, CAPILLARY: Glucose-Capillary: 155 mg/dL — ABNORMAL HIGH (ref 65–99)

## 2015-06-10 LAB — PHOSPHORUS: PHOSPHORUS: 4.6 mg/dL (ref 2.5–4.6)

## 2015-06-10 SURGERY — CYSTOSCOPY
Anesthesia: General | Site: Bladder

## 2015-06-10 MED ORDER — PIPERACILLIN-TAZOBACTAM 3.375 G IVPB
3.3750 g | Freq: Three times a day (TID) | INTRAVENOUS | Status: DC
  Administered 2015-06-10 – 2015-06-12 (×6): 3.375 g via INTRAVENOUS
  Filled 2015-06-10 (×6): qty 50

## 2015-06-10 MED ORDER — PHENYLEPHRINE HCL 10 MG/ML IJ SOLN
30.0000 ug/min | INTRAMUSCULAR | Status: DC
  Administered 2015-06-10: 30 ug/min via INTRAVENOUS
  Administered 2015-06-11: 50 ug/min via INTRAVENOUS
  Filled 2015-06-10 (×2): qty 4

## 2015-06-10 MED ORDER — VANCOMYCIN HCL 10 G IV SOLR
1500.0000 mg | INTRAVENOUS | Status: AC
  Administered 2015-06-10: 1500 mg via INTRAVENOUS
  Filled 2015-06-10: qty 1500

## 2015-06-10 MED ORDER — SODIUM CHLORIDE 0.9 % IV SOLN
INTRAVENOUS | Status: DC
  Administered 2015-06-10 (×2): via INTRAVENOUS
  Filled 2015-06-10: qty 1000

## 2015-06-10 MED ORDER — SIMVASTATIN 20 MG PO TABS
20.0000 mg | ORAL_TABLET | Freq: Every evening | ORAL | Status: DC
  Administered 2015-06-11 – 2015-06-16 (×6): 20 mg via ORAL
  Filled 2015-06-10: qty 1
  Filled 2015-06-10: qty 2
  Filled 2015-06-10 (×3): qty 1
  Filled 2015-06-10: qty 2

## 2015-06-10 MED ORDER — VANCOMYCIN HCL IN DEXTROSE 750-5 MG/150ML-% IV SOLN
750.0000 mg | INTRAVENOUS | Status: DC
  Administered 2015-06-11: 750 mg via INTRAVENOUS
  Filled 2015-06-10 (×2): qty 150

## 2015-06-10 MED ORDER — FENTANYL CITRATE (PF) 100 MCG/2ML IJ SOLN
25.0000 ug | INTRAMUSCULAR | Status: DC | PRN
  Filled 2015-06-10: qty 1

## 2015-06-10 MED ORDER — SODIUM CHLORIDE 0.9 % IV BOLUS (SEPSIS)
1000.0000 mL | Freq: Once | INTRAVENOUS | Status: AC
  Administered 2015-06-10: 1000 mL via INTRAVENOUS

## 2015-06-10 MED ORDER — POTASSIUM CHLORIDE 10 MEQ/100ML IV SOLN
10.0000 meq | INTRAVENOUS | Status: AC
  Administered 2015-06-10 (×3): 10 meq via INTRAVENOUS
  Filled 2015-06-10 (×3): qty 100

## 2015-06-10 MED ORDER — LACTATED RINGERS IV SOLN
INTRAVENOUS | Status: DC
  Filled 2015-06-10: qty 1000

## 2015-06-10 MED ORDER — ONDANSETRON HCL 4 MG/2ML IJ SOLN
4.0000 mg | Freq: Four times a day (QID) | INTRAMUSCULAR | Status: DC | PRN
  Administered 2015-06-12: 4 mg via INTRAVENOUS
  Filled 2015-06-10: qty 2

## 2015-06-10 MED ORDER — SODIUM CHLORIDE 0.9 % IR SOLN
Status: DC | PRN
  Administered 2015-06-10: 3000 mL via INTRAVESICAL

## 2015-06-10 MED ORDER — SODIUM CHLORIDE 0.9 % IV BOLUS (SEPSIS)
500.0000 mL | Freq: Once | INTRAVENOUS | Status: AC
  Administered 2015-06-10: 500 mL via INTRAVENOUS

## 2015-06-10 MED ORDER — LACTATED RINGERS IV SOLN: INTRAVENOUS | Status: DC | PRN

## 2015-06-10 MED ORDER — LIDOCAINE HCL (CARDIAC) 20 MG/ML IV SOLN
INTRAVENOUS | Status: DC | PRN
  Administered 2015-06-10: 60 mg via INTRAVENOUS

## 2015-06-10 MED ORDER — SODIUM CHLORIDE 0.9 % IV SOLN
INTRAVENOUS | Status: DC
  Administered 2015-06-10: 20:00:00 via INTRAVENOUS

## 2015-06-10 MED ORDER — RISAQUAD PO CAPS
2.0000 | ORAL_CAPSULE | Freq: Every day | ORAL | Status: DC
  Administered 2015-06-11 – 2015-06-17 (×7): 2 via ORAL
  Filled 2015-06-10 (×8): qty 2

## 2015-06-10 MED ORDER — PROPOFOL 10 MG/ML IV BOLUS
INTRAVENOUS | Status: DC | PRN
Start: 2015-06-10 — End: 2015-06-10
  Administered 2015-06-10: 120 mg via INTRAVENOUS

## 2015-06-10 MED ORDER — BENZTROPINE MESYLATE 0.5 MG PO TABS
0.5000 mg | ORAL_TABLET | Freq: Every morning | ORAL | Status: DC
  Administered 2015-06-11 – 2015-06-17 (×7): 0.5 mg via ORAL
  Filled 2015-06-10 (×7): qty 1

## 2015-06-10 MED ORDER — ONDANSETRON HCL 4 MG/2ML IJ SOLN
INTRAMUSCULAR | Status: DC | PRN
Start: 2015-06-10 — End: 2015-06-10
  Administered 2015-06-10: 4 mg via INTRAVENOUS

## 2015-06-10 MED ORDER — DEXAMETHASONE SODIUM PHOSPHATE 4 MG/ML IJ SOLN
INTRAMUSCULAR | Status: DC | PRN
  Administered 2015-06-10: 10 mg via INTRAVENOUS

## 2015-06-10 MED ORDER — ASPIRIN EC 325 MG PO TBEC: 325.0000 mg | DELAYED_RELEASE_TABLET | Freq: Every day | ORAL | Status: DC

## 2015-06-10 MED ORDER — SODIUM CHLORIDE 0.9 % IV SOLN: 250.0000 mL | INTRAVENOUS | Status: DC | PRN

## 2015-06-10 MED ORDER — ASPIRIN 300 MG RE SUPP
300.0000 mg | RECTAL | Status: AC
  Administered 2015-06-10: 300 mg via RECTAL
  Filled 2015-06-10: qty 1

## 2015-06-10 MED ORDER — SODIUM CHLORIDE 0.9 % IV SOLN
10.0000 mg | INTRAVENOUS | Status: DC | PRN
  Administered 2015-06-10: 50 ug/min via INTRAVENOUS

## 2015-06-10 MED ORDER — ASPIRIN 81 MG PO CHEW: 324.0000 mg | CHEWABLE_TABLET | ORAL | Status: AC

## 2015-06-10 MED ORDER — MIDAZOLAM HCL 5 MG/5ML IJ SOLN
INTRAMUSCULAR | Status: DC | PRN
  Administered 2015-06-10: 2 mg via INTRAVENOUS

## 2015-06-10 MED ORDER — INSULIN ASPART 100 UNIT/ML ~~LOC~~ SOLN
0.0000 [IU] | SUBCUTANEOUS | Status: DC
  Administered 2015-06-10: 2 [IU] via SUBCUTANEOUS
  Administered 2015-06-11: 1 [IU] via SUBCUTANEOUS

## 2015-06-10 MED ORDER — STERILE WATER FOR IRRIGATION IR SOLN
Status: DC | PRN
  Administered 2015-06-10: 500 mL

## 2015-06-10 MED ORDER — PANTOPRAZOLE SODIUM 40 MG PO TBEC: 40.0000 mg | DELAYED_RELEASE_TABLET | Freq: Every day | ORAL | Status: DC

## 2015-06-10 MED ORDER — CLOZAPINE 100 MG PO TABS
800.0000 mg | ORAL_TABLET | Freq: Every day | ORAL | Status: DC
  Administered 2015-06-11 – 2015-06-16 (×6): 800 mg via ORAL
  Filled 2015-06-10 (×10): qty 8

## 2015-06-10 MED ORDER — PHENYLEPHRINE HCL 10 MG/ML IJ SOLN
30.0000 ug/min | INTRAVENOUS | Status: DC
  Administered 2015-06-10: 50 ug/min via INTRAVENOUS
  Filled 2015-06-10: qty 1

## 2015-06-10 MED ORDER — PIPERACILLIN-TAZOBACTAM 3.375 G IVPB 30 MIN
3.3750 g | INTRAVENOUS | Status: AC
  Administered 2015-06-10: 3.375 g via INTRAVENOUS
  Filled 2015-06-10 (×2): qty 50

## 2015-06-10 MED ORDER — HEPARIN SODIUM (PORCINE) 5000 UNIT/ML IJ SOLN
5000.0000 [IU] | Freq: Three times a day (TID) | INTRAMUSCULAR | Status: DC
  Administered 2015-06-10 – 2015-06-17 (×21): 5000 [IU] via SUBCUTANEOUS
  Filled 2015-06-10 (×21): qty 1

## 2015-06-10 MED ORDER — KETOROLAC TROMETHAMINE 30 MG/ML IJ SOLN
INTRAMUSCULAR | Status: DC | PRN
  Administered 2015-06-10: 30 mg via INTRAVENOUS

## 2015-06-10 MED ORDER — LACTATED RINGERS IV BOLUS (SEPSIS)
2000.0000 mL | Freq: Once | INTRAVENOUS | Status: AC
  Administered 2015-06-10: 2000 mL via INTRAVENOUS

## 2015-06-10 SURGICAL SUPPLY — 28 items
BAG DRN ANRFLXCHMBR STRAP LEK (BAG)
BAG URINE DRAINAGE (UROLOGICAL SUPPLIES) ×3 IMPLANT
BAG URINE LEG 19OZ MD ST LTX (BAG) IMPLANT
BAG URINE LEG 500ML (DRAIN) IMPLANT
BAG URO CATCHER STRL LF (DRAPE) ×4 IMPLANT
CATH FOLEY 2WAY SLVR  5CC 20FR (CATHETERS) ×2
CATH FOLEY 2WAY SLVR  5CC 22FR (CATHETERS)
CATH FOLEY 2WAY SLVR 30CC 20FR (CATHETERS) IMPLANT
CATH FOLEY 2WAY SLVR 5CC 20FR (CATHETERS) ×1 IMPLANT
CATH FOLEY 2WAY SLVR 5CC 22FR (CATHETERS) IMPLANT
CATH FOLEY 3WAY 30CC 22F (CATHETERS) ×3 IMPLANT
CATH INTERMIT  6FR 70CM (CATHETERS) ×3 IMPLANT
CLOTH BEACON ORANGE TIMEOUT ST (SAFETY) ×4 IMPLANT
ELECT REM PT RETURN 9FT ADLT (ELECTROSURGICAL) ×4
ELECTRODE REM PT RTRN 9FT ADLT (ELECTROSURGICAL) ×2 IMPLANT
EVACUATOR MICROVAS BLADDER (UROLOGICAL SUPPLIES) IMPLANT
GLOVE BIO SURGEON STRL SZ8 (GLOVE) ×4 IMPLANT
GOWN STRL REUS W/ TWL LRG LVL3 (GOWN DISPOSABLE) ×2 IMPLANT
GOWN STRL REUS W/TWL LRG LVL3 (GOWN DISPOSABLE) ×4
KIT ROOM TURNOVER WOR (KITS) ×4 IMPLANT
MANIFOLD NEPTUNE II (INSTRUMENTS) IMPLANT
PACK CYSTO (CUSTOM PROCEDURE TRAY) ×4 IMPLANT
PLUG CATH AND CAP STER (CATHETERS) ×3 IMPLANT
SET ASPIRATION TUBING (TUBING) IMPLANT
SYRINGE 10CC LL (SYRINGE) ×3 IMPLANT
SYRINGE IRR TOOMEY STRL 70CC (SYRINGE) IMPLANT
TUBE CONNECTING 12'X1/4 (SUCTIONS)
TUBE CONNECTING 12X1/4 (SUCTIONS) IMPLANT

## 2015-06-10 NOTE — Progress Notes (Signed)
ANTIBIOTIC CONSULT NOTE - INITIAL  Pharmacy Consult for Vancomycin / Zosyn Indication: Sepsis  No Known Allergies  Patient Measurements: Height: 5\' 11"  (180.3 cm) Weight: 153 lb 3.5 oz (69.5 kg) IBW/kg (Calculated) : 75.3 Adjusted Body Weight:   Vital Signs: Temp: 98.2 F (36.8 C) (11/11 1145) Temp Source: Oral (11/11 0929) BP: 91/56 mmHg (11/11 1430) Pulse Rate: 75 (11/11 1430) Intake/Output from previous day:   Intake/Output from this shift: Total I/O In: 1000 [I.V.:1000] Out: 100 [Urine:100]  Labs:  Recent Labs  06/10/15 1004 06/10/15 1320  WBC  --  19.3*  HGB 12.2* 9.7*  PLT  --  606*  CREATININE 3.20* 3.13*   Estimated Creatinine Clearance: 25.9 mL/min (by C-G formula based on Cr of 3.13). No results for input(s): VANCOTROUGH, VANCOPEAK, VANCORANDOM, GENTTROUGH, GENTPEAK, GENTRANDOM, TOBRATROUGH, TOBRAPEAK, TOBRARND, AMIKACINPEAK, AMIKACINTROU, AMIKACIN in the last 72 hours.   Microbiology: No results found for this or any previous visit (from the past 720 hour(s)).  Medical History: Past Medical History  Diagnosis Date  . Paranoid schizophrenia ([place])   . Anxiety   . Depression   . GERD (gastroesophageal reflux disease)     under control  . Foley catheter in place   . History of Clostridium difficile colitis     03-31-2015--  resolved  . Urinary retention   . CKD (chronic kidney disease) stage 3, GFR 30-59 ml/min   . PIN (prostatic intraepithelial neoplasia)     HIGH GRADE  . History of sepsis     secondary to UTI and ARF  x2 admission 03-31-2015 &  04-14-2015  . Hyperlipidemia    Assessment: 65 yoM with PMHx CKD-III, paranoid schizophrenia, anxiety, depression, urinary retention with chronic foley, s/p TURB 8/'16 and recent hospitalizations for UTI, ARF, and Cdiff presents 11/11 for TURP.  Procedure aborted due to likely infection and postop, pt became hypotensive and difficult to arouse in PACU.  Pharmacy consulted to start Vancomycin and Zosyn  for possible intra-abdominal / urinary infection.  Pre-op dose of zosyn given at 11:00.    Anti-infectives 11/11 >> Vancomycin  >> 11/11 >> Zosyn  >>    Vitals/Labs WBC: 19.3K Tm24h: 98.2 Renal: Scr 3.13 (baseline 1.6), CrCl 26 ml/min CG/N  Cultures 11/11 urine: collected 11/11 MRSA PCR: collected  Goal of Therapy:  Vancomycin trough level 15-20 mcg/ml  Eradication of infection  Plan:  Zosyn 3.375g IV q8h (infuse over 4 hours) Vancomycin 1500mg  IV x 1, then 750mg  IV q24h F/u renal function, cultures, clinical course  Ralene Bathe, PharmD, BCPS 06/10/2015, 3:08 PM  Pager: [phone]

## 2015-06-10 NOTE — Transfer of Care (Signed)
Immediate Anesthesia Transfer of Care Note  Patient: Edwin [name]  Procedure(s) Performed: Procedure(s): CYSTOSCOPY  Patient Location: PACU  Anesthesia Type:General  Level of Consciousness: sedated and patient cooperative  Airway & Oxygen Therapy: Patient Spontanous Breathing and Patient connected to nasal cannula oxygen  Post-op Assessment: Report given to RN and Post -op Vital signs reviewed and stable  Post vital signs: Reviewed and stable  Last Vitals:  Filed Vitals:   06/10/15 1145  BP: 100/63  Pulse:   Temp:   Resp:     Complications: No apparent anesthesia complications

## 2015-06-10 NOTE — Addendum Note (Signed)
Addendum  created 06/10/15 1352 by Nolon Nations, MD   Modules edited: Notes Section   Notes Section:  File: [id number]

## 2015-06-10 NOTE — Addendum Note (Signed)
Addendum  created 06/10/15 1347 by Nolon Nations, MD   Modules edited: Notes Section   Notes Section:  Pend: [id number]

## 2015-06-10 NOTE — Consult Note (Signed)
PULMONARY / CRITICAL CARE MEDICINE   Name: Edwin [name] MRN: [id number] DOB: 08-17-1955    ADMISSION DATE:  06/10/2015 CONSULTATION DATE:  06/10/2015  REFERRING MD :  Noah Delaine with Urology  CHIEF COMPLAINT:  Septic Shock  INITIAL PRESENTATION: 59 year old male came to [facility] on 06/10/2015 for elective TURP. Found to have sepsis of urinary origin. Admitted to the ICU and pulmonary and critical care was consulted for further management.  STUDIES:  Port CXR 11/11 - No focal opacity on my review.  SIGNIFICANT EVENTS: 06/10/2015 cystoscopy - finding significant pus and bladder, Foley changed  HISTORY OF PRESENT ILLNESS:  This is a 59 year old male who came to the [facility] on 06/10/2015 for an elective TURP. Several months ago he had a Foley catheter placed which is been in place since then. His baseline creatinine was 1.6. However, after presentation to the hospital he was noted to be mildly hypotensive and on cystoscopy for his procedure he was found to have significant pus throughout his bladder. The transurethral prostatectomy was aborted and the Foley catheter was changed. The patient was admitted to the ICU for further management because of hypotension secondary to a urinary tract infection. Pulmonary and critical care medicine was consulted due to ongoing hypotension and acute kidney injury. Patient has altered mentation and is excessively somnolent. Discussed case with his brother via phone reports he does not routinely complain of anything.  PAST MEDICAL HISTORY :  Past Medical History  Diagnosis Date  . Paranoid schizophrenia ([place])   . Anxiety   . Depression   . GERD (gastroesophageal reflux disease)     under control  . Foley catheter in place   . History of Clostridium difficile colitis     03-31-2015--  resolved  . Urinary retention   . CKD (chronic kidney disease) stage 3, GFR 30-59 ml/min   . PIN (prostatic intraepithelial neoplasia)     HIGH  GRADE  . History of sepsis     secondary to UTI and ARF  x2 admission 03-31-2015 &  04-14-2015  . Hyperlipidemia     PAST SURGICAL HISTORY: Past Surgical History  Procedure Laterality Date  . Cystoscopy N/A 03/08/2015    Procedure: CYSTOSCOPY;  Surgeon: Lowella Bandy, MD;  Location: [facility];  Service: Urology;  Laterality: N/A;  . Transurethral resection of bladder tumor N/A 03/08/2015    Procedure: TRANSURETHRAL RESECTION OF BLADDER TUMOR (TURBT);  Surgeon: Lowella Bandy, MD;  Location: [facility];  Service: Urology;  Laterality: N/A;  TUR BIOPSY BLADDER  . Prostate biopsy N/A 03/08/2015    Procedure: BIOPSY TRANSRECTAL ULTRASONIC PROSTATE (TUBP);  Surgeon: Lowella Bandy, MD;  Location: [facility];  Service: Urology;  Laterality: N/A;  . Cervical disc surgery  1990's    Prior to Admission medications   Medication Sig Start Date End Date Taking? Authorizing Provider  acetaminophen (TYLENOL) 500 MG tablet Take 1,000 mg by mouth every 4 (four) hours as needed for moderate pain or headache.   Yes Historical Provider, MD  aspirin EC 325 MG tablet Take 325 mg by mouth every 6 (six) hours as needed.   Yes Historical Provider, MD  benztropine (COGENTIN) 0.5 MG tablet Take 0.5 mg by mouth every morning. 12/13/14  Yes Historical Provider, MD  cloZAPine (CLOZARIL) 100 MG tablet Take 800 mg by mouth at bedtime.   Yes Historical Provider, MD  simvastatin (ZOCOR) 20 MG tablet Take 20 mg by mouth daily. 10/10/14  Yes Historical Provider, MD   No Known  Allergies  FAMILY HISTORY:  has no family status information on file.  SOCIAL HISTORY:  reports that he has been smoking Cigarettes.  He has a 40 pack-year smoking history. He has never used smokeless tobacco. He reports that he does not drink alcohol or use illicit drugs.  REVIEW OF SYSTEMS:  Unable to obtain secondary to altered mentation.  SUBJECTIVE:   VITAL SIGNS: Temp:  [94.3 F (34.6 C)-98.2 F (36.8 C)] 94.3 F (34.6 C) (11/11 1654) Pulse Rate:  [57-117] 62  (11/11 1700) Resp:  [10-24] 10 (11/11 1700) BP: (80-108)/(55-70) 80/55 mmHg (11/11 1700) SpO2:  [99 %-100 %] 100 % (11/11 1700) Weight:  [69.5 kg (153 lb 3.5 oz)-69.627 kg (153 lb 8 oz)] 69.5 kg (153 lb 3.5 oz) (11/11 1440) HEMODYNAMICS:   VENTILATOR SETTINGS:   INTAKE / OUTPUT:  Intake/Output Summary (Last 24 hours) at 06/10/15 1729 Last data filed at 06/10/15 1700  Gross per 24 hour  Intake 2781.25 ml  Output    100 ml  Net 2681.25 ml    PHYSICAL EXAMINATION: General:  No acute distress. Laying in bed eyes closed sleepy until awoken.  Integument:  Warm & dry. No rash on exposed skin. No bruising. Lymphatics:  No appreciated cervical or supraclavicular lymphadenoapthy. HEENT:  Tacky mucus membranes. No oral ulcers. No scleral injection or icterus.  Cardiovascular:  Regular rate. No edema. No appreciable JVD.  Pulmonary:  Good aeration & clear to auscultation bilaterally. Symmetric chest wall rise. No accessory muscle use on room air. Abdomen: Soft. Normal bowel sounds. Nondistended. Grossly nontender. Genitourinary: Foley catheter in place. Significant erosion that appears chronic to the dorsal glans of the penis. Musculoskeletal:  Normal bulk and tone. Hand grip strength 5/5 bilaterally. No joint deformity or effusion appreciated. Neurological:  CN 2-12 grossly in tact. No meningismus. Moving all 4 extremities equally.  Psychiatric:  Patient very somnolent. He believed he was in [facility]. Would not stay awake to answer further questioning.  LABS:  CBC  Recent Labs Lab 06/10/15 1004 06/10/15 1320  WBC  --  19.3*  HGB 12.2* 9.7*  HCT 36.0* 29.5*  PLT  --  606*   Coag's No results for input(s): APTT, INR in the last 168 hours. BMET  Recent Labs Lab 06/10/15 1004 06/10/15 1320  NA 134* 134*  K 3.1* 3.2*  CL 100* 103  CO2  --  20*  BUN 26* 29*  CREATININE 3.20* 3.13*  GLUCOSE 152* 129*   Electrolytes  Recent Labs Lab 06/10/15 1320  CALCIUM  8.1*   Sepsis Markers  Recent Labs Lab 06/10/15 1615  LATICACIDVEN 0.5   ABG No results for input(s): PHART, PCO2ART, PO2ART in the last 168 hours. Liver Enzymes No results for input(s): AST, ALT, ALKPHOS, BILITOT, ALBUMIN in the last 168 hours. Cardiac Enzymes No results for input(s): TROPONINI, PROBNP in the last 168 hours. Glucose No results for input(s): GLUCAP in the last 168 hours.  Imaging No results found.   ASSESSMENT / PLAN:  PULMONARY  A:  No acute issues.  P:   Monitor for hypoxia with continuous pulse ox  CARDIOVASCULAR A:  Septic shock - IVC <1cm & very dynamic on bedside ultrasound.  P:  Continue fluid resuscitation Neo-Synephrine to maintain map greater than 65 if necessary Trending lactic acid Monitoring on telemetry Checking cardiac biomarkers Checking EKG & transthoracic echocardiogram Checking plasma cortisol  RENAL A:   Acute on chronic renal failure Hypokalemia - given IV potassium chloride for replacement.  P:   Neurology following Continue fluid resuscitation  Monitor urine output  Bilateral renal ultrasound  Monitor renal function with daily BUN/creatinine  Monitor daily electrolytes  GASTROINTESTINAL A:   Gastroesophageal reflux disease History of C. Difficile  P:  Nothing by mouth until mental status improves Started on probiotic  HEMATOLOGIC A:   Anemia - Appears chronic. No signs of active bleeding. Leukocytosis - Secondary to sepsis.  P:  Monitor for bleeding Type and screen  Transfuse PRBC if hemoglobin less than 7 g/dL SCDs Heparin subcutaneous every 8 hours  INFECTIOUS A:   Septic shock - secondary to UTI. Urinary tract infection   P:   Procalcitonin algorithm  BCx2 11/11 > UC 11/11 >  Vanc 11/11 >> Zosyn 11/11 >>  ENDOCRINE A:   Hyperglycemia   P:  Accu-Cheks every 4 hours  Low-dose sliding scale insulin algorithm  NEUROLOGIC A:   Toxic metabolic encephalopathy Baseline   Schizophrenia & Depression  P:   Minimize sedating meds Frequent orientation Lights on during day, off at night Holding home medications for now pending improvement in mental status    FAMILY  - Updates: Brother contacted via phone this evening to update.  - Inter-disciplinary family meet or Palliative Care meeting due by:  11/18  TODAY'S SUMMARY:  59 year old male with chronic Foley catheter & prior urine cultures with multidrug-resistant organism. Currently patient is in septic shock. High potential for further clinical decompensation with critical status. This was communicated to the patient's brother. Remains full code at this time. Continuing aggressive fluid resuscitation with excellent peripheral IV access.  I have spent a total of 39 minutes of critical care time today caring for the patient, updating patient's brother via phone, and reviewing the patient's electronic medical record.   Sonia Baller Ashok Cordia, M.D. [facility] Pulmonary & Critical Care Pager:  [phone] After 3pm or if no response, call [phone]  06/10/2015, 5:29 PM

## 2015-06-10 NOTE — Progress Notes (Signed)
Dr. Ashok Cordia at bedside. Patient difficult to arouse- RASS-3. OK to hold PO meds and diet throughout the night or until patient becomes more alert and can safely swallow. Luther Parody, RN

## 2015-06-10 NOTE — Anesthesia Postprocedure Evaluation (Signed)
  Anesthesia Post-op Note  Patient: Edwin [name]  Procedure(s) Performed: Procedure(s): CYSTOSCOPY  Patient Location: PACU  Anesthesia Type: General  Level of Consciousness: awake and alert   Airway and Oxygen Therapy: Patient Spontanous Breathing  Post-op Pain: mild  Post-op Assessment: Post-op Vital signs reviewed, Patient's Cardiovascular Status Stable, Respiratory Function Stable, Patent Airway and No signs of Nausea or vomiting  Last Vitals:  Filed Vitals:   06/10/15 1145  BP: 100/63  Pulse: 85  Temp: 36.8 C  Resp: 19    Post-op Vital Signs: stable   Complications: No apparent anesthesia complications

## 2015-06-10 NOTE — Anesthesia Preprocedure Evaluation (Addendum)
Anesthesia Evaluation  Patient identified by MRN, date of birth, ID band Patient awake    Reviewed: Allergy & Precautions, H&P , NPO status , Patient's Chart, lab work & pertinent test results  Airway Mallampati: II  TM Distance: >3 FB Neck ROM: Limited    Dental  (+) Dental Advisory Given, Caps, Poor Dentition,  All upper front are capped. Lower teeth rotten:   Pulmonary neg pulmonary ROS, Current Smoker,  04-14-15 Chest x-ray FINDINGS: There is no edema or consolidation. Heart size and pulmonary vascularity are normal. No adenopathy. There is degenerative change in the thoracic spine   Pulmonary exam normal breath sounds clear to auscultation       Cardiovascular Exercise Tolerance: Good negative cardio ROS Normal cardiovascular exam Rhythm:regular Rate:Normal  14-Apr-2015  Sinus tachycardia rate-105   Neuro/Psych PSYCHIATRIC DISORDERS Anxiety Depression Schizophrenia S/p ACDF negative neurological ROS  negative psych ROS   GI/Hepatic negative GI ROS, Neg liver ROS, GERD  Medicated,(+) Cirrhosis       ,   Endo/Other  negative endocrine ROS  Renal/GU Renal InsufficiencyRenal diseaseStage 3 kidney disease Creatinine 3.2 on 06-10-15   negative genitourinary   Musculoskeletal   Abdominal   Peds  Hematology negative hematology ROS (+)   Anesthesia Other Findings 40 lb weight loss in last 4 months  Reproductive/Obstetrics negative OB ROS                         Anesthesia Physical Anesthesia Plan  ASA: III  Anesthesia Plan: General   Post-op Pain Management:    Induction: Intravenous  Airway Management Planned: LMA  Additional Equipment:   Intra-op Plan:   Post-operative Plan:   Informed Consent: I have reviewed the patients History and Physical, chart, labs and discussed the procedure including the risks, benefits and alternatives for the proposed anesthesia with the patient  or authorized representative who has indicated his/her understanding and acceptance.   Dental Advisory Given  Plan Discussed with: CRNA and Surgeon  Anesthesia Plan Comments:         Anesthesia Quick Evaluation                                  Anesthesia Evaluation  Patient identified by MRN, date of birth, ID band Patient awake    Reviewed: Allergy & Precautions, NPO status , Patient's Chart, lab work & pertinent test results  Airway Mallampati: II  TM Distance: >3 FB Neck ROM: Full    Dental   Pulmonary Current Smoker,  breath sounds clear to auscultation        Cardiovascular negative cardio ROS  Rhythm:Regular Rate:Normal     Neuro/Psych Anxiety Depression Schizophrenia negative neurological ROS     GI/Hepatic Neg liver ROS, GERD-  ,  Endo/Other  negative endocrine ROS  Renal/GU CRFRenal disease     Musculoskeletal   Abdominal   Peds  Hematology negative hematology ROS (+)   Anesthesia Other Findings   Reproductive/Obstetrics                            Anesthesia Physical Anesthesia Plan  ASA: II  Anesthesia Plan: General   Post-op Pain Management:    Induction: Intravenous  Airway Management Planned: LMA  Additional Equipment:   Intra-op Plan:   Post-operative Plan: Extubation in OR  Informed Consent: I have reviewed the patients History and  Physical, chart, labs and discussed the procedure including the risks, benefits and alternatives for the proposed anesthesia with the patient or authorized representative who has indicated his/her understanding and acceptance.   Dental advisory given  Plan Discussed with: CRNA  Anesthesia Plan Comments:         Anesthesia Quick Evaluation

## 2015-06-10 NOTE — H&P (Signed)
Urology Admission H&P  Chief Complaint: suprapubic pain  History of Present Illness: Edwin [name] is a 59yo here today for TURP. He has a hx of ARF and had a foley placed several months ago. Creatinine came down to 1.6 prior to discharge in 03/2015. Creatinine today is 3.2. The patient has not been drinking and eating well for the past month. Pt is a poor historian  Past Medical History  Diagnosis Date  . Paranoid schizophrenia ([place])   . Anxiety   . Depression   . GERD (gastroesophageal reflux disease)     under control  . Foley catheter in place   . History of Clostridium difficile colitis     03-31-2015--  resolved  . Urinary retention   . CKD (chronic kidney disease) stage 3, GFR 30-59 ml/min   . PIN (prostatic intraepithelial neoplasia)     HIGH GRADE  . History of sepsis     secondary to UTI and ARF  x2 admission 03-31-2015 &  04-14-2015  . Hyperlipidemia    Past Surgical History  Procedure Laterality Date  . Cystoscopy N/A 03/08/2015    Procedure: CYSTOSCOPY;  Surgeon: Lowella Bandy, MD;  Location: [facility];  Service: Urology;  Laterality: N/A;  . Transurethral resection of bladder tumor N/A 03/08/2015    Procedure: TRANSURETHRAL RESECTION OF BLADDER TUMOR (TURBT);  Surgeon: Lowella Bandy, MD;  Location: [facility];  Service: Urology;  Laterality: N/A;  TUR BIOPSY BLADDER  . Prostate biopsy N/A 03/08/2015    Procedure: BIOPSY TRANSRECTAL ULTRASONIC PROSTATE (TUBP);  Surgeon: Lowella Bandy, MD;  Location: [facility];  Service: Urology;  Laterality: N/A;  . Cervical disc surgery  1990's    Home Medications:  Prescriptions prior to admission  Medication Sig Dispense Refill Last Dose  . acetaminophen (TYLENOL) 500 MG tablet Take 1,000 mg by mouth every 4 (four) hours as needed for moderate pain or headache.   06/09/2015 at Unknown time  . aspirin EC 325 MG tablet Take 325 mg by mouth every 6 (six) hours as needed.   Past Week at Unknown time  . benztropine (COGENTIN) 0.5 MG tablet Take 0.5 mg by mouth  every morning.  4 06/09/2015 at Unknown time  . cloZAPine (CLOZARIL) 100 MG tablet Take 800 mg by mouth at bedtime.   06/09/2015 at Unknown time  . simvastatin (ZOCOR) 20 MG tablet Take 20 mg by mouth daily.  3 06/09/2015 at Unknown time   Allergies: No Known Allergies  History reviewed. No pertinent family history. Social History:  reports that he has been smoking Cigarettes.  He has a 40 pack-year smoking history. He has never used smokeless tobacco. He reports that he does not drink alcohol or use illicit drugs.  Review of Systems  All other systems reviewed and are negative.   Physical Exam:  Vital signs in last 24 hours: Temp:  [97.3 F (36.3 C)] 97.3 F (36.3 C) (11/11 0929) Pulse Rate:  [117] 117 (11/11 0929) Resp:  [14] 14 (11/11 0929) BP: (97)/(60) 97/60 mmHg (11/11 0929) SpO2:  [100 %] 100 % (11/11 0929) Weight:  [69.627 kg (153 lb 8 oz)] 69.627 kg (153 lb 8 oz) (11/11 [id number]) Physical Exam  Constitutional: He is oriented to person, place, and time. He appears well-developed.  HENT:  Head: Normocephalic and atraumatic.  Eyes: EOM are normal. Pupils are equal, round, and reactive to light.  Neck: Normal range of motion. No thyromegaly present.  Cardiovascular: Normal rate and regular rhythm.   Respiratory: Effort normal. No  respiratory distress.  GI: Soft. He exhibits no distension.  Musculoskeletal: Normal range of motion.  Neurological: He is alert and oriented to person, place, and time.  Skin: Skin is warm and dry.  Psychiatric: He has a normal mood and affect. His behavior is normal. Judgment and thought content normal.    Laboratory Data:  Results for orders placed or performed during the hospital encounter of 06/10/15 (from the past 24 hour(s))  I-STAT, chem 8     Status: Abnormal   Collection Time: 06/10/15 10:04 AM  Result Value Ref Range   Sodium 134 (L) 135 - 145 mmol/L   Potassium 3.1 (L) 3.5 - 5.1 mmol/L   Chloride 100 (L) 101 - 111 mmol/L   BUN 26  (H) 6 - 20 mg/dL   Creatinine, Ser 3.20 (H) 0.61 - 1.24 mg/dL   Glucose, Bld 152 (H) 65 - 99 mg/dL   Calcium, Ion 1.16 1.12 - 1.23 mmol/L   TCO2 19 0 - 100 mmol/L   Hemoglobin 12.2 (L) 13.0 - 17.0 g/dL   HCT 36.0 (L) 39.0 - 52.0 %   No results found for this or any previous visit (from the past 240 hour(s)). Creatinine:  Recent Labs  06/10/15 1004  CREATININE 3.20*   Baseline Creatinine: 1.6  Impression/Assessment:  58yo with BPH LUTS, retention,. ARF  Plan:  The risks/benefits/alternatives to cysto, TURP, bilateral retrograde, possible bilateral stent placement was explained to the patient and brother and they understand and wish to proceed with surgery  MCKENZIE, PATRICK L 06/10/2015, 10:40 AM

## 2015-06-10 NOTE — Anesthesia Postprocedure Evaluation (Signed)
Anesthesia Post Note  Patient: Edwin [name]  Procedure(s) Performed: Procedure(s): CYSTOSCOPY  Anesthesia type: General  Patient location: PACU  Post pain: Pain level controlled  Post assessment: Post-op Vital signs reviewed  Last Vitals: BP 87/60 mmHg  Pulse 80  Temp(Src) 36.8 C (Oral)  Resp 22  Ht 5\' 11"  (1.803 m)  Wt 153 lb 8 oz (69.627 kg)  BMI 21.42 kg/m2  SpO2 100%  Post vital signs: Reviewed, marginal. Hypotensive on and off in PACU.  Level of consciousness: sedated, very difficult to arouse. Oriented to person and place with much prompting and stimulus, but quickly drifts back to sleep  Complications: No apparent anesthesia complications

## 2015-06-10 NOTE — Anesthesia Procedure Notes (Signed)
Procedure Name: LMA Insertion Date/Time: 06/10/2015 11:00 AM Performed by: Wanita Chamberlain Pre-anesthesia Checklist: Patient identified, Timeout performed, Emergency Drugs available, Suction available and Patient being monitored Patient Re-evaluated:Patient Re-evaluated prior to inductionOxygen Delivery Method: Circle system utilized Preoxygenation: Pre-oxygenation with 100% oxygen Intubation Type: IV induction Ventilation: Mask ventilation without difficulty LMA Size: 4.0 Number of attempts: 1 Placement Confirmation: positive ETCO2,  CO2 detector and breath sounds checked- equal and bilateral Tube secured with: Tape Dental Injury: Teeth and Oropharynx as per pre-operative assessment

## 2015-06-11 ENCOUNTER — Inpatient Hospital Stay (HOSPITAL_COMMUNITY): Payer: Medicare Other

## 2015-06-11 DIAGNOSIS — R579 Shock, unspecified: Secondary | ICD-10-CM

## 2015-06-11 HISTORY — PX: TRANSTHORACIC ECHOCARDIOGRAM: SHX275

## 2015-06-11 LAB — RENAL FUNCTION PANEL
ALBUMIN: 2 g/dL — AB (ref 3.5–5.0)
ANION GAP: 8 (ref 5–15)
BUN: 28 mg/dL — ABNORMAL HIGH (ref 6–20)
CO2: 20 mmol/L — ABNORMAL LOW (ref 22–32)
Calcium: 8.1 mg/dL — ABNORMAL LOW (ref 8.9–10.3)
Chloride: 115 mmol/L — ABNORMAL HIGH (ref 101–111)
Creatinine, Ser: 2.88 mg/dL — ABNORMAL HIGH (ref 0.61–1.24)
GFR calc Af Amer: 27 mL/min — ABNORMAL LOW (ref >60)
GFR, EST NON AFRICAN AMERICAN: 23 mL/min — AB (ref >60)
Glucose, Bld: 132 mg/dL — ABNORMAL HIGH (ref 65–99)
PHOSPHORUS: 4.5 mg/dL (ref 2.5–4.6)
POTASSIUM: 4 mmol/L (ref 3.5–5.1)
Sodium: 143 mmol/L (ref 135–145)

## 2015-06-11 LAB — CBC WITH DIFFERENTIAL/PLATELET
BASOS ABS: 0 10*3/uL (ref 0.0–0.1)
BASOS PCT: 0 %
EOS PCT: 0 %
Eosinophils Absolute: 0 10*3/uL (ref 0.0–0.7)
HCT: 27.7 % — ABNORMAL LOW (ref 39.0–52.0)
Hemoglobin: 9.1 g/dL — ABNORMAL LOW (ref 13.0–17.0)
LYMPHS PCT: 4 %
Lymphs Abs: 0.9 10*3/uL (ref 0.7–4.0)
MCH: 25.8 pg — ABNORMAL LOW (ref 26.0–34.0)
MCHC: 32.9 g/dL (ref 30.0–36.0)
MCV: 78.5 fL (ref 78.0–100.0)
Monocytes Absolute: 0.5 10*3/uL (ref 0.1–1.0)
Monocytes Relative: 2 %
NEUTROS ABS: 18.5 10*3/uL — AB (ref 1.7–7.7)
Neutrophils Relative %: 94 %
PLATELETS: 568 10*3/uL — AB (ref 150–400)
RBC: 3.53 MIL/uL — AB (ref 4.22–5.81)
RDW: 17.5 % — ABNORMAL HIGH (ref 11.5–15.5)
WBC: 19.9 10*3/uL — AB (ref 4.0–10.5)

## 2015-06-11 LAB — GLUCOSE, CAPILLARY
GLUCOSE-CAPILLARY: 129 mg/dL — AB (ref 65–99)
GLUCOSE-CAPILLARY: 115 mg/dL — AB (ref 65–99)
Glucose-Capillary: 119 mg/dL — ABNORMAL HIGH (ref 65–99)
Glucose-Capillary: 127 mg/dL — ABNORMAL HIGH (ref 65–99)
Glucose-Capillary: 179 mg/dL — ABNORMAL HIGH (ref 65–99)

## 2015-06-11 LAB — MAGNESIUM: Magnesium: 1.9 mg/dL (ref 1.7–2.4)

## 2015-06-11 LAB — TROPONIN I
Troponin I: <0.03 ng/mL (ref <0.031)
Troponin I: <0.03 ng/mL (ref <0.031)

## 2015-06-11 LAB — PROCALCITONIN: Procalcitonin: 0.54 ng/mL

## 2015-06-11 LAB — ABO/RH: ABO/RH(D): A POS

## 2015-06-11 LAB — LACTIC ACID, PLASMA: LACTIC ACID, VENOUS: 0.6 mmol/L (ref 0.5–2.0)

## 2015-06-11 MED ORDER — SODIUM CHLORIDE 0.9 % IV BOLUS (SEPSIS)
1000.0000 mL | Freq: Once | INTRAVENOUS | Status: AC
  Administered 2015-06-11: 1000 mL via INTRAVENOUS

## 2015-06-11 MED ORDER — NICOTINE 14 MG/24HR TD PT24
14.0000 mg | MEDICATED_PATCH | Freq: Every day | TRANSDERMAL | Status: DC
  Administered 2015-06-11 – 2015-06-17 (×7): 14 mg via TRANSDERMAL
  Filled 2015-06-11 (×7): qty 1

## 2015-06-11 MED ORDER — SODIUM CHLORIDE 0.9 % IV SOLN
INTRAVENOUS | Status: DC
  Administered 2015-06-11: via INTRAVENOUS
  Administered 2015-06-11: 75 mL/h via INTRAVENOUS

## 2015-06-11 NOTE — Progress Notes (Signed)
Patient has drank 2000 ml in fluids today.  Notified Dr. Elsworth Soho that patient may drink water excessively.
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
## 2015-06-11 NOTE — Progress Notes (Addendum)
PULMONARY / CRITICAL CARE MEDICINE   Name: Edwin [name] MRN: [id number] DOB: 1955/12/25    ADMISSION DATE:  06/10/2015 CONSULTATION DATE:  06/10/2015  REFERRING MD :  Noah Delaine with Urology  CHIEF COMPLAINT:  Septic Shock  SUBJECTIVE:  Feels hungry.  Denies chest/abd pain.  VITAL SIGNS: BP 98/61 mmHg  Pulse 71  Temp(Src) 97.2 F (36.2 C) (Rectal)  Resp 15  Ht 5\' 11"  (1.803 m)  Wt 154 lb 12.2 oz (70.2 kg)  BMI 21.59 kg/m2  SpO2 98%  INTAKE / OUTPUT: I/O last 3 completed shifts: In: 6053.5 [I.V.:3853.5; Other:1000; IV Piggyback:1200] Out: 3050 [Urine:3050]  PHYSICAL EXAMINATION: General: pleasant HEENT: no sinus tenderness, pupils reactive Cardiac: regular, no murmur Chest: no wheeze Abd: soft, non tender Ext: no edema Neuro: alert, normal strength Skin: no rashes  LABS:  CBC  Recent Labs Lab 06/10/15 1320 06/10/15 2047 06/11/15 0236  WBC 19.3*  --  19.9*  HGB 9.7* 8.3* 9.1*  HCT 29.5* 26.2* 27.7*  PLT 606*  --  568*   BMET  Recent Labs Lab 06/10/15 1004 06/10/15 1320 06/11/15 0236  NA 134* 134* 143  K 3.1* 3.2* 4.0  CL 100* 103 115*  CO2  --  20* 20*  BUN 26* 29* 28*  CREATININE 3.20* 3.13* 2.88*  GLUCOSE 152* 129* 132*   Electrolytes  Recent Labs Lab 06/10/15 1320 06/10/15 2047 06/11/15 0236  CALCIUM 8.1*  --  8.1*  MG  --  1.8 1.9  PHOS  --  4.6 4.5   Sepsis Markers  Recent Labs Lab 06/10/15 1615 06/10/15 1910 06/10/15 2047 06/11/15 0236  LATICACIDVEN 0.5 0.8  --  0.6  PROCALCITON  --   --  0.64 0.54   Liver Enzymes  Recent Labs Lab 06/10/15 2047 06/11/15 0236  AST 16  --   ALT 17  --   ALKPHOS 83  --   BILITOT 0.3  --   ALBUMIN 1.9* 2.0*   Cardiac Enzymes  Recent Labs Lab 06/10/15 2047 06/11/15 0204 06/11/15 0748  TROPONINI <0.03 <0.03 <0.03   Glucose  Recent Labs Lab 06/10/15 2016 06/11/15 0039 06/11/15 0441 06/11/15 0832  GLUCAP 155* 119* 129* 115*    Imaging US Renal  06/10/2015   CLINICAL DATA:  Acute on chronic renal failure. Urinary retention. Initial encounter. EXAM: RENAL / URINARY TRACT ULTRASOUND COMPLETE COMPARISON:  Renal ultrasound performed 03/31/2015, and CT of the abdomen and pelvis performed 02/18/2015 FINDINGS: Right Kidney: Length: 14.0 cm. 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 Parenchymal echogenicity is grossly unremarkable. Left Kidney: Length: 13.2 cm. 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 Parenchymal echogenicity is grossly unremarkable. Bladder: Irregularly thickened bladder wall is concerning for neoplasm, as noted on prior CT. Would correlate as to whether this has been diagnosed. The prostate remains enlarged. A Foley catheter is noted in expected position. IMPRESSION: 1. Severe bilateral hydroureteronephrosis again noted, similar in appearance to the prior CT. 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 2. Irregularly thickened bladder wall remains concerning for neoplasm, as noted on prior CT. Would correlate as to whether malignancy has been diagnosed since July. 3. Enlarged prostate again noted. Electronically Signed   By: Jacqulynn Cadet  Chang M.D.   On: 06/10/2015 21:20   Dg Chest Port 1 View  06/10/2015  CLINICAL DATA:  Pt uncommunicative, rales, sob, weakness EXAM: PORTABLE CHEST 1 VIEW COMPARISON:  04/14/2015 FINDINGS: Shallow lung inflation. Heart size is normal. No focal consolidation or pleural effusion. No pulmonary edema. IMPRESSION: No active disease. Electronically Signed   By: Nolon Nations M.D.   On: 06/10/2015 19:00   CULTURES: Blood 11/11 >> Urine 11/11 >>   STUDIES:  11/11 Renal  u/s >> b/l hydronephrosis 11/12 Echo >>  SIGNIFICANT EVENTS: 06/10/2015 cystoscopy - finding significant pus and bladder, Foley changed  DISCUSSION: 59 yo male with chronic urine retention and b/l hydronephrosis 2nd to BPH.  He had TURP 11/11 and developed acute encephalopathy with septic shock post-procedure, likely from urinary source.  ASSESSMENT / PLAN:  Septic shock likely from urinary source. Plan: - give 1 liter NS bolus x 1 on 11/12 - add NS at 75 ml/hr on 11/12 - wean off phenylephrine to keep MAP > 65 - defer CVL placement for now - day 2 of vancomycin, zosyn - f/u procalcitonin, Echo - cortisol 10.1 from 11/11 >> if unable to wean off pressors, then add solu cortef  Acute encephalopathy 2nd to sepsis >> much improved. Hx of Schizophrenia, depression. Plan: - clozaril, cogentin  AKI. Hx of CKD stage 3 >> baseline creatinine 1.6 from September 2016. Hypokalemia. Plan: - continue IV fluid - f/u BMET - replace electrolytes as needed  BPH with urinary retention and b/l hydronephrosis s/p TURP 11/11. Plan: - post op care per urology  Tobacco abuse. Plan: - nicotine patch  Elevated blood sugar >> no hx of DM. Plan: - monitor blood sugar on BMET  Anemia of chronic disease. Plan: - f/u CBC  Protein calorie malnutrition. Plan: - regular diet - probiotic  Hx of HLD. Plan: - zocor  SQ heparin for DVT prophylaxis Full code  CC time 36 minutes  Chesley Mires, MD [place] 06/11/2015, 9:48 AM Pager:  [phone] After 3pm call: [phone]

## 2015-06-11 NOTE — Progress Notes (Signed)
  Echocardiogram 2D Echocardiogram has been performed.  Aggie Cosier 06/11/2015, 9:11 AM

## 2015-06-12 LAB — RENAL FUNCTION PANEL
ALBUMIN: 2 g/dL — AB (ref 3.5–5.0)
Anion gap: 7 (ref 5–15)
BUN: 28 mg/dL — AB (ref 6–20)
CALCIUM: 7.7 mg/dL — AB (ref 8.9–10.3)
CO2: 20 mmol/L — ABNORMAL LOW (ref 22–32)
Chloride: 114 mmol/L — ABNORMAL HIGH (ref 101–111)
Creatinine, Ser: 2.59 mg/dL — ABNORMAL HIGH (ref 0.61–1.24)
GFR calc Af Amer: 30 mL/min — ABNORMAL LOW (ref >60)
GFR calc non Af Amer: 26 mL/min — ABNORMAL LOW (ref >60)
GLUCOSE: 108 mg/dL — AB (ref 65–99)
PHOSPHORUS: 2.5 mg/dL (ref 2.5–4.6)
POTASSIUM: 3.1 mmol/L — AB (ref 3.5–5.1)
SODIUM: 141 mmol/L (ref 135–145)

## 2015-06-12 LAB — CBC WITH DIFFERENTIAL/PLATELET
BASOS PCT: 0 %
Basophils Absolute: 0 10*3/uL (ref 0.0–0.1)
Eosinophils Absolute: 0 10*3/uL (ref 0.0–0.7)
Eosinophils Relative: 0 %
HEMATOCRIT: 27.7 % — AB (ref 39.0–52.0)
Hemoglobin: 8.9 g/dL — ABNORMAL LOW (ref 13.0–17.0)
Lymphocytes Relative: 8 %
Lymphs Abs: 1.9 10*3/uL (ref 0.7–4.0)
MCH: 25.7 pg — AB (ref 26.0–34.0)
MCHC: 32.1 g/dL (ref 30.0–36.0)
MCV: 80.1 fL (ref 78.0–100.0)
MONO ABS: 1.3 10*3/uL — AB (ref 0.1–1.0)
MONOS PCT: 6 %
NEUTROS ABS: 19.5 10*3/uL — AB (ref 1.7–7.7)
Neutrophils Relative %: 86 %
Platelets: 629 10*3/uL — ABNORMAL HIGH (ref 150–400)
RBC: 3.46 MIL/uL — ABNORMAL LOW (ref 4.22–5.81)
RDW: 18 % — AB (ref 11.5–15.5)
WBC: 22.7 10*3/uL — ABNORMAL HIGH (ref 4.0–10.5)

## 2015-06-12 LAB — PROCALCITONIN: Procalcitonin: 0.26 ng/mL

## 2015-06-12 LAB — MAGNESIUM: Magnesium: 1.6 mg/dL — ABNORMAL LOW (ref 1.7–2.4)

## 2015-06-12 MED ORDER — CIPROFLOXACIN HCL 500 MG PO TABS
500.0000 mg | ORAL_TABLET | Freq: Every day | ORAL | Status: DC
  Administered 2015-06-12 – 2015-06-15 (×4): 500 mg via ORAL
  Filled 2015-06-12 (×4): qty 1

## 2015-06-12 MED ORDER — KCL IN DEXTROSE-NACL 20-5-0.9 MEQ/L-%-% IV SOLN
INTRAVENOUS | Status: DC
Start: 2015-06-12 — End: 2015-06-17
  Administered 2015-06-12: 75 mL/h via INTRAVENOUS
  Administered 2015-06-13 – 2015-06-17 (×7): via INTRAVENOUS
  Filled 2015-06-12 (×10): qty 1000

## 2015-06-12 MED ORDER — ENSURE ENLIVE PO LIQD
237.0000 mL | Freq: Two times a day (BID) | ORAL | Status: DC
  Administered 2015-06-13 – 2015-06-16 (×7): 237 mL via ORAL

## 2015-06-12 NOTE — Progress Notes (Signed)
PULMONARY / CRITICAL CARE MEDICINE   Name: ANTOINE MACALUSO MRN: [id number] DOB: 08/25/1955    ADMISSION DATE:  06/10/2015 CONSULTATION DATE:  06/10/2015  REFERRING MD :  Noah Delaine with Urology  CHIEF COMPLAINT:  Septic Shock  SUBJECTIVE:  Feels better.  More alert.  No chest pain.  Breathing okay.  VITAL SIGNS: BP 120/71 mmHg  Pulse 104  Temp(Src) 98 F (36.7 C) (Oral)  Resp 29  Ht 5\' 11"  (1.803 m)  Wt 157 lb 10.1 oz (71.5 kg)  BMI 21.99 kg/m2  SpO2 94%  INTAKE / OUTPUT: I/O last 3 completed shifts: In: 8332.9 [P.O.:4040; I.V.:3842.9; IV Piggyback:450] Out: [id number] [Urine:6375]  PHYSICAL EXAMINATION: General: pleasant HEENT: no sinus tenderness, pupils reactive Cardiac: regular, no murmur Chest: no wheeze Abd: soft, non tender Ext: no edema Neuro: alert, normal strength Skin: no rashes  LABS: CMP Latest Ref Rng 06/12/2015 06/11/2015 06/10/2015  Glucose 65 - 99 mg/dL 108(H) 132(H) -  BUN 6 - 20 mg/dL 28(H) 28(H) -  Creatinine 0.61 - 1.24 mg/dL 2.59(H) 2.88(H) -  Sodium 135 - 145 mmol/L 141 143 -  Potassium 3.5 - 5.1 mmol/L 3.1(L) 4.0 -  Chloride 101 - 111 mmol/L 114(H) 115(H) -  CO2 22 - 32 mmol/L 20(L) 20(L) -  Calcium 8.9 - 10.3 mg/dL 7.7(L) 8.1(L) -  Total Protein 6.5 - 8.1 g/dL - - 5.5(L)  Total Bilirubin 0.3 - 1.2 mg/dL - - 0.3  Alkaline Phos 38 - 126 U/L - - 83  AST 15 - 41 U/L - - 16  ALT 17 - 63 U/L - - 17    CBC Latest Ref Rng 06/12/2015 06/11/2015 06/10/2015  WBC 4.0 - 10.5 K/uL 22.7(H) 19.9(H) -  Hemoglobin 13.0 - 17.0 g/dL 8.9(L) 9.1(L) 8.3(L)  Hematocrit 39.0 - 52.0 % 27.7(L) 27.7(L) 26.2(L)  Platelets 150 - 400 K/uL 629(H) 568(H) -   IMAGING: US Renal  06/10/2015  CLINICAL DATA:  Acute on chronic renal failure. Urinary retention. Initial encounter. EXAM: RENAL / URINARY TRACT ULTRASOUND COMPLETE COMPARISON:  Renal ultrasound performed 03/31/2015, and CT of the abdomen and pelvis performed 02/18/2015 FINDINGS: Right Kidney: Length: 14.0 cm.  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 Parenchymal echogenicity is grossly unremarkable. Left Kidney: Length: 13.2 cm. 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 Parenchymal echogenicity is grossly unremarkable. Bladder: Irregularly thickened bladder wall is concerning for neoplasm, as noted on prior CT. Would correlate as to whether this has been diagnosed. The prostate remains enlarged. A Foley catheter is noted in expected position. IMPRESSION: 1. Severe bilateral hydroureteronephrosis again noted, similar in appearance to the prior CT. 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 2. Irregularly thickened bladder wall remains concerning for neoplasm, as noted on prior CT. Would correlate as to whether malignancy has been diagnosed since July. 3. Enlarged prostate again noted. Electronically Signed   By: Garald Balding M.D.   On: 06/10/2015 21:20   Dg Chest Port 1 View  06/10/2015  CLINICAL DATA:  Pt uncommunicative, rales, sob, weakness EXAM: PORTABLE CHEST 1 VIEW COMPARISON:  04/14/2015 FINDINGS: Shallow lung inflation. Heart size is normal. No focal consolidation or pleural effusion. No pulmonary edema. IMPRESSION: No active disease. Electronically Signed  By: Nolon Nations M.D.   On: 06/10/2015 19:00    CULTURES: Blood 11/11 >> Urine 11/11 >> GNR >>   STUDIES:  11/11 Renal u/s >> b/l hydronephrosis 11/12 Echo >> EF [id number], grade 1 diastolic dysfx  SIGNIFICANT EVENTS: 11/11 cystoscopy - finding significant pus and bladder, Foley changed 11/13 off pressors  DISCUSSION: 59 yo male with chronic urine retention and b/l hydronephrosis 2nd to  BPH.  He had TURP 11/11 and developed acute encephalopathy with septic shock post-procedure, likely from urinary source.  ASSESSMENT / PLAN:  Septic shock likely from urinary source with GNR in urine cx Plan: - d/c phenylephrine from [facility] - day 3 zosyn - d/c vancomycin  Acute encephalopathy 2nd to sepsis >> resolved. Hx of Schizophrenia, depression. Plan: - clozaril, cogentin  AKI. Hx of CKD stage 3 >> baseline creatinine 1.6 from September 2016. Hypokalemia. Polydipsia. Plan: - f/u BMET - replace electrolytes as needed  BPH with urinary retention and b/l hydronephrosis s/p TURP 11/11. Plan: - post op care per urology  Tobacco abuse. Plan: - nicotine patch  Elevated blood sugar >> no hx of DM. Plan: - monitor blood sugar on BMET  Anemia of chronic disease. Plan: - f/u CBC  Protein calorie malnutrition. Plan: - regular diet - probiotic  Hx of HLD. Plan: - zocor  SQ heparin for DVT prophylaxis Full code  D/w Dr. Alinda Money.  Okay to transfer out of ICU.    PCCM will sign off.  Please call if additional help is needed.  Chesley Mires, MD [facility] 06/12/2015, 8:16 AM Pager:  [phone] After 3pm call: [phone]

## 2015-06-12 NOTE — Progress Notes (Signed)
Received pt from ICU, report given per Levada Dy RN, oriented to unit, call light placed with in reach

## 2015-06-12 NOTE — Progress Notes (Signed)
Patient is unable to order his own meals.  Patient has a good appetite and has eaten well the last two days.  Patient is able to move and turn, but he does not make small movements in bed, unless prompted by RN.

## 2015-06-12 NOTE — Progress Notes (Signed)
Patient ID: Edwin [name], male   DOB: 08/12/1955, 59 y.o.   MRN: [id number]  2 Days Post-Op Subjective: Pt improved.  Now hemodynamically stable and off all pressors and additional fluid hydration.  He has been afebrile.  Receiving Vancomycin and Zosyn. Pt noted to have drank > 2 L of water overnight per nursing.  Objective: Vital signs in last 24 hours: Temp:  [97.3 F (36.3 C)-98 F (36.7 C)] 98 F (36.7 C) (11/13 0400) Pulse Rate:  [55-104] 104 (11/13 0600) Resp:  [12-29] 29 (11/13 0600) BP: (88-146)/(53-91) 120/71 mmHg (11/13 0600) SpO2:  [94 %-100 %] 94 % (11/13 0600) Weight:  [71.5 kg (157 lb 10.1 oz)] 71.5 kg (157 lb 10.1 oz) (11/13 0500)  Intake/Output from previous day: 11/12 0701 - 11/13 0700 In: 5833.1 [P.O.:4040; I.V.:1543.1; IV Piggyback:250] Out: [id number] [Urine:4175] Intake/Output this shift:    Physical Exam:  General: Alert and oriented Abdomen: Soft, ND, NT, No CVAT Ext: NT, No erythema GU: Catheter draining well with clear urine.  Lab Results:  Recent Labs  06/10/15 2047 06/11/15 0236 06/12/15 0410  HGB 8.3* 9.1* 8.9*  HCT 26.2* 27.7* 27.7*   CBC Latest Ref Rng 06/12/2015 06/11/2015 06/10/2015  WBC 4.0 - 10.5 K/uL 22.7(H) 19.9(H) -  Hemoglobin 13.0 - 17.0 g/dL 8.9(L) 9.1(L) 8.3(L)  Hematocrit 39.0 - 52.0 % 27.7(L) 27.7(L) 26.2(L)  Platelets 150 - 400 K/uL 629(H) 568(H) -      BMET  Recent Labs  06/11/15 0236 06/12/15 0410  NA 143 141  K 4.0 3.1*  CL 115* 114*  CO2 20* 20*  GLUCOSE 132* 108*  BUN 28* 28*  CREATININE 2.88* 2.59*  CALCIUM 8.1* 7.7*    Urine culture: GNRs with sensitivities pending. Blood cultures: Pending.  Studies/Results:  Assessment/Plan: 1) Sepsis due to UTI with bladder outlet obstruction: Pt felt to be stable to transfer to floor today per CCM.  WBC slightly increased but no other obvious source for infection.  Will stop Vancomycin considering preliminary culture results and will follow WBC.  Continue Foley  catheter drainage.  2) Hydronephrosis/CKD: Renal function improving but he has baseline CKD.  Will need repeat upper tract evaluation particularly if he does not continue his clinical improvement to ensure resolution of hydronephrosis.  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 Alyson Ingles.   4) Hypokalemia: Will replace K+ and follow BMP.  Will address possible polydipsia and monitor sodium.  5) Disposition: Social work consult ordered.  Pt does not appear able to care for himself independently at home and has minimal assistance from family.    LOS: 2 days   Rosielee Corporan,LES 06/12/2015, 8:17 AM

## 2015-06-13 ENCOUNTER — Encounter (HOSPITAL_BASED_OUTPATIENT_CLINIC_OR_DEPARTMENT_OTHER): Payer: Self-pay | Admitting: Urology

## 2015-06-13 DIAGNOSIS — E440 Moderate protein-calorie malnutrition: Secondary | ICD-10-CM | POA: Insufficient documentation

## 2015-06-13 LAB — BASIC METABOLIC PANEL
Anion gap: 7 (ref 5–15)
BUN: 24 mg/dL — AB (ref 6–20)
CHLORIDE: 112 mmol/L — AB (ref 101–111)
CO2: 25 mmol/L (ref 22–32)
CREATININE: 2.43 mg/dL — AB (ref 0.61–1.24)
Calcium: 7.7 mg/dL — ABNORMAL LOW (ref 8.9–10.3)
GFR calc Af Amer: 33 mL/min — ABNORMAL LOW (ref >60)
GFR calc non Af Amer: 28 mL/min — ABNORMAL LOW (ref >60)
Glucose, Bld: 100 mg/dL — ABNORMAL HIGH (ref 65–99)
POTASSIUM: 3.3 mmol/L — AB (ref 3.5–5.1)
Sodium: 144 mmol/L (ref 135–145)

## 2015-06-13 LAB — CBC
HEMATOCRIT: 30 % — AB (ref 39.0–52.0)
Hemoglobin: 9.3 g/dL — ABNORMAL LOW (ref 13.0–17.0)
MCH: 24.8 pg — AB (ref 26.0–34.0)
MCHC: 31 g/dL (ref 30.0–36.0)
MCV: 80 fL (ref 78.0–100.0)
PLATELETS: 633 10*3/uL — AB (ref 150–400)
RBC: 3.75 MIL/uL — ABNORMAL LOW (ref 4.22–5.81)
RDW: 18 % — AB (ref 11.5–15.5)
WBC: 18.1 10*3/uL — AB (ref 4.0–10.5)

## 2015-06-13 LAB — URINE CULTURE

## 2015-06-13 NOTE — Progress Notes (Signed)
CSW received referral for placement.  CSW awaiting PT evaluation in order to determine appropriate placement needs.   CSW to assess once PT evaluates pt.   Alison Murray, MSW, [facility] [phone]

## 2015-06-13 NOTE — Care Management Note (Signed)
Case Management Note  Patient Details  Name: Edwin [name] MRN: [id number] Date of Birth: 25-Apr-1956  Subjective/Objective:58 y/o m admitted w/UTI,urien retention,s/p cystoscopy.From home.PT-cons-await recc.                    Action/Plan:d/c plan home.   Expected Discharge Date:                  Expected Discharge Plan:  Home/Self Care  In-House Referral:     Discharge planning Services  CM Consult  Post Acute Care Choice:    Choice offered to:     DME Arranged:    DME Agency:     HH Arranged:    HH Agency:     Status of Service:  In process, will continue to follow  Medicare Important Message Given:  Yes Date Medicare IM Given:    Medicare IM give by:    Date Additional Medicare IM Given:    Additional Medicare Important Message give by:     If discussed at [place] of Stay Meetings, dates discussed:    Additional Comments:  Dessa Phi, RN 06/13/2015, 9:47 PM

## 2015-06-13 NOTE — Progress Notes (Signed)
Initial Nutrition Assessment  DOCUMENTATION CODES:   Non-severe (moderate) malnutrition in context of chronic illness  INTERVENTION:  - Continue Ensure Enlive BID, each supplement provides 350 kcal and 20 grams of protein - RD will continue to monitor for needs  NUTRITION DIAGNOSIS:   Unintentional weight loss related to acute illness as evidenced by percent weight loss.  GOAL:   Patient will meet greater than or equal to 90% of their needs  MONITOR:   PO intake, Supplement acceptance, Weight trends, Labs, Skin, I & O's  REASON FOR ASSESSMENT:   Malnutrition Screening Tool  ASSESSMENT:   [name] is a 59yo here today for TURP. He has a hx of ARF and had a foley placed several months ago. Creatinine came down to 1.6 prior to discharge in 03/2015. Creatinine today is 3.2. The patient has not been drinking and eating well for the past month. Pt is a poor historian  Pt seen for MST. BMI indicates normal weight status. Per chart review, pt ate 100% breakfast and 50% lunch yesterday (11/13) and 75% breakfast this AM.  Pt was covered in sheet over his head, sleeping soundly at time of RD visit. He did not arouse to name call x4 and no family/visitors in the room. RN states she did not see breakfast this AM. Empty Ensure Enlive bottle was noted on bedside table.   Per chart review, pt has lost 20 lbs (11% body weight) in the past 3 months. Unable to perform physical assessment at this time. Pt meets criteria for at least non-severe malnutrition but may meet criteria for severe malnutrition once assessment able to be done.   Pt likes meeting needs since admission. S/P aborted TURP 06/10/15. Medications reviewed. Labs reviewed; CBGs: 115-149 mg/dL, K: 3.3 mmol/L, Cl: 112 mmol/L, BUN elevated, Ca: 7.7 mg/dL, Mg: 1.6 mg/dL, GFR: 33.   Diet Order:  Diet regular Room service appropriate?: Yes; Fluid consistency:: Thin  Skin:  Wound (see comment) (Incision to penis)  Last BM:   11/14  Height:   Ht Readings from Last 1 Encounters:  06/10/15 5\' 11"  (1.803 m)    Weight:   Wt Readings from Last 1 Encounters:  06/13/15 156 lb 12 oz (71.1 kg)    Ideal Body Weight:  78.18 kg (kg)  BMI:  Body mass index is 21.87 kg/(m^2).  Estimated Nutritional Needs:   Kcal:  [id number]  Protein:  70-80 grams  Fluid:  2-2.2 L/day  EDUCATION NEEDS:   No education needs identified at this time     Jarome Matin, RD, LDN Inpatient Clinical Dietitian Pager # [phone] After hours/weekend pager # [phone]

## 2015-06-13 NOTE — Care Management Important Message (Signed)
Important Message  Patient Details IM Letter given to Cookie/Case Manager to present to [place] Message  Patient Details  Name: Edwin [name] MRN: [id number] Date of Birth: May 07, 1956   Medicare Important Message Given:  Yes    Camillo Flaming 06/13/2015, 11:59 AM Name: Edwin [name] MRN: [id number] Date of Birth: 1955-11-03   Medicare Important Message Given:  Yes    Camillo Flaming 06/13/2015, 11:58 AM

## 2015-06-13 NOTE — Progress Notes (Signed)
3 Days Post-Op Subjective: Patient reports no pain. He has been eating regular diet. His WBC count decreased and creatinine improved to 2.4.   Objective: Vital signs in last 24 hours: Temp:  [97.5 F (36.4 C)-97.7 F (36.5 C)] 97.6 F (36.4 C) (11/14 1315) Pulse Rate:  [73-105] 105 (11/14 1315) Resp:  [18-20] 19 (11/14 1315) BP: (100-122)/(43-77) 100/67 mmHg (11/14 1315) SpO2:  [98 %-100 %] 100 % (11/14 1315) Weight:  [71.1 kg (156 lb 12 oz)] 71.1 kg (156 lb 12 oz) (11/14 0536)  Intake/Output from previous day: 11/13 0701 - 11/14 0700 In: 3858.8 [P.O.:2000; I.V.:1808.8; IV Piggyback:50] Out: [id number] [Urine:5800] Intake/Output this shift: Total I/O In: -  Out: 400 [Urine:400]  Physical Exam:  General:alert, cooperative and appears older than stated age GI: soft, non tender, normal bowel sounds, no palpable masses, no organomegaly, no inguinal hernia Male genitalia: not done Extremities: extremities normal, atraumatic, no cyanosis or edema  Lab Results:  Recent Labs  06/11/15 0236 06/12/15 0410 06/13/15 0510  HGB 9.1* 8.9* 9.3*  HCT 27.7* 27.7* 30.0*   BMET  Recent Labs  06/12/15 0410 06/13/15 0510  NA 141 144  K 3.1* 3.3*  CL 114* 112*  CO2 20* 25  GLUCOSE 108* 100*  BUN 28* 24*  CREATININE 2.59* 2.43*  CALCIUM 7.7* 7.7*   No results for input(s): LABPT, INR in the last 72 hours. No results for input(s): LABURIN in the last 72 hours. Results for orders placed or performed during the hospital encounter of 06/10/15  Urine culture     Status: None   Collection Time: 06/10/15 11:49 AM  Result Value Ref Range Status   Specimen Description URINE, CATHETERIZED CYSTOSCOPY  Final   Special Requests NONE  Final   Culture   Final    >=100,000 COLONIES/mL SERRATIA MARCESCENS Performed at [facility]    Report Status 06/13/2015 FINAL  Final   Organism ID, Bacteria SERRATIA MARCESCENS  Final      Susceptibility   Serratia marcescens - MIC*    CEFAZOLIN  >=64 RESISTANT Resistant     CEFTRIAXONE <=1 SENSITIVE Sensitive     CIPROFLOXACIN <=0.25 SENSITIVE Sensitive     GENTAMICIN <=1 SENSITIVE Sensitive     NITROFURANTOIN 128 RESISTANT Resistant     TRIMETH/SULFA <=20 SENSITIVE Sensitive     * >=100,000 COLONIES/mL SERRATIA MARCESCENS  MRSA PCR Screening     Status: None   Collection Time: 06/10/15  2:48 PM  Result Value Ref Range Status   MRSA by PCR NEGATIVE NEGATIVE Final    Comment:        The GeneXpert MRSA Assay (FDA approved for NASAL specimens only), is one component of a comprehensive MRSA colonization surveillance program. It is not intended to diagnose MRSA infection nor to guide or monitor treatment for MRSA infections.   Culture, blood (routine x 2)     Status: None (Preliminary result)   Collection Time: 06/10/15  7:00 PM  Result Value Ref Range Status   Specimen Description BLOOD LEFT ARM  Final   Special Requests IN PEDIATRIC BOTTLE  [place]  Final   Culture   Final    NO GROWTH 3 DAYS Performed at [facility]    Report Status PENDING  Incomplete  Culture, blood (routine x 2)     Status: None (Preliminary result)   Collection Time: 06/10/15  7:10 PM  Result Value Ref Range Status   Specimen Description BLOOD LEFT HAND  Final   Special Requests  BOTTLES DRAWN AEROBIC AND ANAEROBIC  [place]  Final   Culture   Final    NO GROWTH 3 DAYS Performed at [facility]    Report Status PENDING  Incomplete    Studies/Results: No results found.  Assessment/Plan: 59yo with BPH with LUTS, urinary retention, ARF, UTI, protein calory malnutrition  1. Continue foley catheter to straight drain 2. Continue gentle hydration 3. Continue recs per dietary 4. Continue cipro for UTI 5. Await PT recs     LOS: 3 days   Taijuan Serviss L 06/13/2015, 8:32 PM

## 2015-06-13 NOTE — Op Note (Signed)
Preoperative diagnosis: BPH  Postoperative diagnosis: BPH, pyocystis  Procedure: 1 cystoscopy 2. Bladder irrigation 3. Foley placement  Attending: Nicolette Bang  Anesthesia: General  Estimated blood loss: Minimal  Drains: 22 French foley  Specimens: 1. Urine for culture  Antibiotics: Zosyn  Findings: Trilobar prostate enlargement. Ureteral orifices in normal anatomic location. 500cc of purulent urine in the bladder  Indications: Patient is a 59 year old male with a history of BPH and urinary retention.  After discussing treatment options, they decided proceed with transurethral resection of the prostate.  Procedure her in detail: The patient was brought to the operating room and a brief timeout was done to ensure correct patient, correct procedure, correct site.  General anesthesia was administered patient was placed in dorsal lithotomy position.  Their genitalia was then prepped and draped in usual sterile fashion.  A rigid 74 French cystoscope was passed in the urethra and the bladder.  Bladder was inspected and we noted no masses or lesions.  the ureteral orifices were in the normal orthotopic locations. There was over 500cc of purulent urine in the bladder. The bladder was then irrigated with normal saline until the bladder was clear. the bladder was then drained, a 22 French foley was placed and this concluded the procedure which was well tolerated by patient.  Complications: None  Condition: Stable, extubated, transferred to PACU  Plan: Patient is admitted to the ICU for concern for sepsis.

## 2015-06-14 NOTE — Progress Notes (Signed)
4 Days Post-Op  Subjective:  1 - Urinary Retention / Enlarged Prostate - manages with chronic foley at baseline, was planned for TURP 11/11, but procedure abandoned after gross pyocystis discovered, continues with foley.  2 - Urosepsis, Pyocystis - fever, tachycardia and gross pyocystis after cysto / bladder irrigation / foley change 06/10/15. BCX 11/11 negative. UCX 11/11 Serratia sens Cipro which he is now on.  3 - Acute on Chronic Renal Failure - baselin Cr 1.5-2 range. Acute rise to 3 on admission associated with pyocystis and likely dehydration, now improvigng with improved catheter drainage and hydration.  4 - Disposition / Living Arrangement - pt schizophrenic, lives with family at baseline but now appears unable to meet his level of need. SW, CM, PT consults pending final disposition.   Today "Edwin [name] " is w/o complaints or acute events. Remains afebrile with kidney function trending better.   Objective: Vital signs in last 24 hours: Temp:  [97.6 F (36.4 C)-98.6 F (37 C)] 98.6 F (37 C) (11/15 0453) Pulse Rate:  [78-105] 88 (11/15 0453) Resp:  [16-19] 16 (11/15 0453) BP: (100-120)/(67-81) 120/81 mmHg (11/15 0453) SpO2:  [99 %-100 %] 99 % (11/15 0453) Weight:  [69.9 kg (154 lb 1.6 oz)] 69.9 kg (154 lb 1.6 oz) (11/15 0543) Last BM Date: 06/13/15  Intake/Output from previous day: 11/14 0701 - 11/15 0700 In: 1434 [P.O.:1434] Out: 3900 [Urine:3900] Intake/Output this shift:    General appearance: alert and AO x 1, non-combative but slow mentation. Stigmata of neglect. Eyes: negative Nose: Nares normal. Septum midline. Mucosa normal. No drainage or sinus tenderness. Throat: lips, mucosa, and tongue normal; teeth and gums normal Neck: supple, symmetrical, trachea midline Back: symmetric, no curvature. ROM normal. No CVA tenderness. Resp: non-labored on room air Cardio: Nl rate GI: soft, non-tender; bowel sounds normal; no masses,  no organomegaly Male genitalia: normal,  fole c/d/y with clear urine. Distal hypospadias noted.  Extremities: extremities normal, atraumatic, no cyanosis or edema Pulses: 2+ and symmetric Skin: Skin color, texture, turgor normal. No rashes or lesions Lymph nodes: Cervical, supraclavicular, and axillary nodes normal.  Lab Results:   Recent Labs  06/12/15 0410 06/13/15 0510  WBC 22.7* 18.1*  HGB 8.9* 9.3*  HCT 27.7* 30.0*  PLT 629* 633*   BMET  Recent Labs  06/12/15 0410 06/13/15 0510  NA 141 144  K 3.1* 3.3*  CL 114* 112*  CO2 20* 25  GLUCOSE 108* 100*  BUN 28* 24*  CREATININE 2.59* 2.43*  CALCIUM 7.7* 7.7*   PT/INR No results for input(s): LABPROT, INR in the last 72 hours. ABG No results for input(s): PHART, HCO3 in the last 72 hours.  Invalid input(s): PCO2, PO2  Studies/Results: No results found.  Anti-infectives: Anti-infectives    Start     Dose/Rate Route Frequency Ordered Stop   06/12/15 1600  ciprofloxacin (CIPRO) tablet 500 mg     500 mg Oral Daily 06/12/15 1401     06/11/15 1600  vancomycin (VANCOCIN) IVPB 750 mg/150 ml premix  Status:  Discontinued     750 mg 150 mL/hr over 60 Minutes Intravenous Every 24 hours 06/10/15 1507 06/12/15 0816   06/10/15 2000  piperacillin-tazobactam (ZOSYN) IVPB 3.375 g  Status:  Discontinued     3.375 g 12.5 mL/hr over 240 Minutes Intravenous 3 times per day 06/10/15 1507 06/12/15 1542   06/10/15 1515  vancomycin (VANCOCIN) 1,500 mg in sodium chloride 0.9 % 500 mL IVPB     1,500 mg 250 mL/hr over 120  Minutes Intravenous NOW 06/10/15 1507 06/10/15 1736   06/10/15 0923  piperacillin-tazobactam (ZOSYN) IVPB 3.375 g     3.375 g 100 mL/hr over 30 Minutes Intravenous 30 min pre-op 06/10/15 [id number] 06/10/15 1119      Assessment/Plan:  1 - Urinary Retention / Enlarged Prostate - continue current foley for now. Will consider re-attempt TURP when completely clears all infectious parameters.   2 - Urosepsis, Pyocystis - improving on CX-specific therapy, continue  IV ABX.   3 - Acute on Chronic Renal Failure - likely pre/post renal from dehydration + obstructed cathter from pyocystis. Now improving. Check BMp again tomorrow.   4 - Disposition / Living Arrangement - i do not feel pt safe to return home. Will likely need some sort of assisted living / group home / or similar.  5 - Remain in house, check BMP, CBC tomorrow AM.   Tresa Moore, Tenelle Andreason 06/14/2015

## 2015-06-14 NOTE — Evaluation (Signed)
Physical Therapy Evaluation Patient Details Name: YISHAI FAVRE MRN: [id number] DOB: 02/09/1956 Today's Date: 06/14/2015   History of Present Illness  59 yo male admitted with sepsis, UTI. s/p cystoscopy, bladder irrigation, foley replacement, aborted TURP 06/10/15. Hx of paranoid schizophrenia, ARF, catheter, prostate cancer.  Clinical Impression  On eval, pt was Min guard assist for mobility-walked ~540 feet around unit. Mildly unsteady but no LOB. Pt tolerated distance well.     Follow Up Recommendations Home health PT;Supervision/Assistance - 24 hour vs SNF (depending on progress and assist available at home)    Equipment Recommendations  None recommended by PT    Recommendations for Other Services       Precautions / Restrictions Precautions Precautions: Fall Restrictions Weight Bearing Restrictions: No      Mobility  Bed Mobility Overal bed mobility: Modified Independent                Transfers Overall transfer level: Needs assistance   Transfers: Sit to/from Stand Sit to Stand: Supervision         General transfer comment: for safety, catheter   Ambulation/Gait Ambulation/Gait assistance: Min guard Ambulation Distance (Feet): 540 Feet Assistive device: None Gait Pattern/deviations: Step-through pattern     General Gait Details: close guard for safety. mildly unsteady but no LOB. Pt tolerated distance well.   Stairs            Wheelchair Mobility    Modified Rankin (Stroke Patients Only)       Balance Overall balance assessment: Needs assistance         Standing balance support: During functional activity Standing balance-Leahy Scale: Good                               Pertinent Vitals/Pain Pain Assessment: No/denies pain    Home Living Family/patient expects to be discharged to:: Unsure Living Arrangements: Alone   Type of Home: House Home Access: Stairs to enter   Technical brewer of Steps:  3-4 Home Layout: One level        Prior Function Level of Independence: Needs assistance   Gait / Transfers Assistance Needed: pt stated he doesn't move very well           Hand Dominance        Extremity/Trunk Assessment   Upper Extremity Assessment: Generalized weakness           Lower Extremity Assessment: Generalized weakness      Cervical / Trunk Assessment: Normal  Communication   Communication:  (very low speaking voice volume)  Cognition Arousal/Alertness: Awake/alert Behavior During Therapy: WFL for tasks assessed/performed Overall Cognitive Status: Within Functional Limits for tasks assessed                      General Comments      Exercises        Assessment/Plan    PT Assessment Patient needs continued PT services  PT Diagnosis Difficulty walking   PT Problem List Decreased mobility  PT Treatment Interventions Functional mobility training;Therapeutic activities;Patient/family education;Gait training;Therapeutic exercise   PT Goals (Current goals can be found in the Care Plan section) Acute Rehab PT Goals Patient Stated Goal: none stated PT Goal Formulation: With patient Time For Goal Achievement: 06/28/15 Potential to Achieve Goals: Good    Frequency Min 3X/week   Barriers to discharge        Co-evaluation  End of Session Equipment Utilized During Treatment: Gait belt Activity Tolerance: Patient tolerated treatment well Patient left: in bed;with call bell/phone within reach;with bed alarm set           Time: 0941-1001 PT Time Calculation (min) (ACUTE ONLY): 20 min   Charges:   PT Evaluation $Initial PT Evaluation Tier I: 1 Procedure     PT G Codes:        Weston Anna, MPT Pager: [phone]

## 2015-06-15 LAB — CBC
HCT: 30.8 % — ABNORMAL LOW (ref 39.0–52.0)
Hemoglobin: 9.4 g/dL — ABNORMAL LOW (ref 13.0–17.0)
MCH: 24.5 pg — ABNORMAL LOW (ref 26.0–34.0)
MCHC: 30.5 g/dL (ref 30.0–36.0)
MCV: 80.4 fL (ref 78.0–100.0)
PLATELETS: 607 10*3/uL — AB (ref 150–400)
RBC: 3.83 MIL/uL — AB (ref 4.22–5.81)
RDW: 18.2 % — AB (ref 11.5–15.5)
WBC: 14.1 10*3/uL — AB (ref 4.0–10.5)

## 2015-06-15 LAB — BASIC METABOLIC PANEL
Anion gap: 6 (ref 5–15)
BUN: 17 mg/dL (ref 6–20)
CALCIUM: 8.2 mg/dL — AB (ref 8.9–10.3)
CO2: 23 mmol/L (ref 22–32)
CREATININE: 1.99 mg/dL — AB (ref 0.61–1.24)
Chloride: 113 mmol/L — ABNORMAL HIGH (ref 101–111)
GFR, EST AFRICAN AMERICAN: 41 mL/min — AB (ref >60)
GFR, EST NON AFRICAN AMERICAN: 36 mL/min — AB (ref >60)
Glucose, Bld: 100 mg/dL — ABNORMAL HIGH (ref 65–99)
Potassium: 3.6 mmol/L (ref 3.5–5.1)
SODIUM: 142 mmol/L (ref 135–145)

## 2015-06-15 LAB — CULTURE, BLOOD (ROUTINE X 2)
CULTURE: NO GROWTH
CULTURE: NO GROWTH

## 2015-06-15 NOTE — Progress Notes (Signed)
5 Days Post-Op Subjective: Patient reports no pain. He has been eating regular diet. His WBC count decreased to 14 and creatinine improved to 2.   Objective: Vital signs in last 24 hours: Temp:  [97.2 F (36.2 C)-97.8 F (36.6 C)] 97.8 F (36.6 C) (11/16 1400) Pulse Rate:  [88-101] 101 (11/16 1400) Resp:  [18] 18 (11/16 1400) BP: (100-110)/(65-73) 100/65 mmHg (11/16 1400) SpO2:  [98 %-99 %] 99 % (11/16 1400) Weight:  [70.3 kg (154 lb 15.7 oz)] 70.3 kg (154 lb 15.7 oz) (11/16 0600)  Intake/Output from previous day: 11/15 0701 - 11/16 0700 In: 1617 [P.O.:1617] Out: 2825 [Urine:2825] Intake/Output this shift:    Physical Exam:  General:alert, cooperative and appears older than stated age GI: soft, non tender, normal bowel sounds, no palpable masses, no organomegaly, no inguinal hernia Male genitalia: not done Extremities: extremities normal, atraumatic, no cyanosis or edema  Lab Results:  Recent Labs  06/13/15 0510 06/15/15 0459  HGB 9.3* 9.4*  HCT 30.0* 30.8*   BMET  Recent Labs  06/13/15 0510 06/15/15 0459  NA 144 142  K 3.3* 3.6  CL 112* 113*  CO2 25 23  GLUCOSE 100* 100*  BUN 24* 17  CREATININE 2.43* 1.99*  CALCIUM 7.7* 8.2*   No results for input(s): LABPT, INR in the last 72 hours. No results for input(s): LABURIN in the last 72 hours. Results for orders placed or performed during the hospital encounter of 06/10/15  Urine culture     Status: None   Collection Time: 06/10/15 11:49 AM  Result Value Ref Range Status   Specimen Description URINE, CATHETERIZED CYSTOSCOPY  Final   Special Requests NONE  Final   Culture   Final    >=100,000 COLONIES/mL SERRATIA MARCESCENS Performed at [facility]    Report Status 06/13/2015 FINAL  Final   Organism ID, Bacteria SERRATIA MARCESCENS  Final      Susceptibility   Serratia marcescens - MIC*    CEFAZOLIN >=64 RESISTANT Resistant     CEFTRIAXONE <=1 SENSITIVE Sensitive     CIPROFLOXACIN <=0.25  SENSITIVE Sensitive     GENTAMICIN <=1 SENSITIVE Sensitive     NITROFURANTOIN 128 RESISTANT Resistant     TRIMETH/SULFA <=20 SENSITIVE Sensitive     * >=100,000 COLONIES/mL SERRATIA MARCESCENS  MRSA PCR Screening     Status: None   Collection Time: 06/10/15  2:48 PM  Result Value Ref Range Status   MRSA by PCR NEGATIVE NEGATIVE Final    Comment:        The GeneXpert MRSA Assay (FDA approved for NASAL specimens only), is one component of a comprehensive MRSA colonization surveillance program. It is not intended to diagnose MRSA infection nor to guide or monitor treatment for MRSA infections.   Culture, blood (routine x 2)     Status: None   Collection Time: 06/10/15  7:00 PM  Result Value Ref Range Status   Specimen Description BLOOD LEFT ARM  Final   Special Requests IN PEDIATRIC BOTTLE  [place]  Final   Culture   Final    NO GROWTH 5 DAYS Performed at [facility]    Report Status 06/15/2015 FINAL  Final  Culture, blood (routine x 2)     Status: None   Collection Time: 06/10/15  7:10 PM  Result Value Ref Range Status   Specimen Description BLOOD LEFT HAND  Final   Special Requests BOTTLES DRAWN AEROBIC AND ANAEROBIC  [facility]  Final   Culture  Final    NO GROWTH 5 DAYS Performed at [facility]    Report Status 06/15/2015 FINAL  Final    Studies/Results: No results found.  Assessment/Plan: 59yo with BPH with LUTS, urinary retention, ARF, UTI, protein calory malnutrition  1. Continue foley catheter to straight drain 2. Continue gentle hydration 3.  Continue cipro for UTI 4.  Awaiting placement in SNF    LOS: 5 days   Nayib Remer L 06/15/2015, 8:36 PM

## 2015-06-15 NOTE — Clinical Social Work Note (Signed)
Clinical Social Work Assessment  Patient Details  Name: Edwin [name] MRN: [id number] Date of Birth: 03/03/1956  Date of referral:  06/15/15               Reason for consult:  Discharge Planning                Permission sought to share information with:  Family Supports Permission granted to share information::     Name::     Devyne Wadding  Agency::     Relationship::  brother  Contact Information:  [phone]  Housing/Transportation Living arrangements for the past 2 months:  Single Family Home Source of Information:  Other (Comment Required) (brother) Patient Interpreter Needed:  None Criminal Activity/Legal Involvement Pertinent to Current Situation/Hospitalization:  No - Comment as needed Significant Relationships:  Siblings Lives with:  Self Do you feel safe going back to the place where you live?  Yes Need for family participation in patient care:  Yes (Comment)  Care giving concerns:  Pt admitted from home alone. Pt brother is support and checks on pt. PT recommending HH PT 24 hr/care vs. New SNF. PT note reflect that pt walking min guard almost 600 feet. [employer] will not cover SNF with pt ambulating that distance and pt does not have other needs that would qualify for SNF.    Social Worker assessment / plan:  CSW visited pt room, but pt unable to adequately participate in assessment.  CSW spoke with pt brother, [name] via telephone. Pt brother reports that pt lives alone, but pt brother checks on pt often. CSW discussed with pt brother that pt is ambulating almost 600 feet and [employer] will cover SNF with pt ambulating that distance. CSW inquired with pt brother if pt had Medicaid and pt brother states that pt does not have Medicaid. CSW discussed with pt brother that CSW could look into private pay options for SNF or ALF, but pt brother states that pt does not have the financial means to pay privately. CSW encouraged pt brother to apply  for Medicaid for pt as it appears that pt may need placement in ALF and Medicaid assist in paying for that placement. CSW informed pt brother on where to go to apply for Medicaid. Pt brother is agreeable to home health services and states that home health was coming to pt home, but stopped coming two weeks ago. Pt brother reports that pt often waits until he is feeling very ill until telling pt brother that he is sick. CSW encouraged pt brother to check on pt often and stated that CSW will ask RNCM about full home health services and include a home health social worker to assist with resources at home.   CSW informed RNCM that pt insurance will not cover SNF and pt brother states that pt is unable to pay privately for facility placement. Notified RNCM that CSW recommending [place] [facility] to be ordered in order to continue to assist with resources in the home.  No further social work needs identified at this time.  CSW signing off.   Employment status:  Disabled (Comment on whether or not currently receiving Disability) Insurance information:  Programmer, applications PT Recommendations:  [place], Home with [place], 24 Hour Supervision Information / Referral to community resources:  Other (Comment Required) (Providing pt brother with information around applying for Medicaid, referred for [facility] for [facility] services. Would recommend [place] [facility])  Patient/Family's Response to care:  Pt alert  and oriented x 3, but delayed response and difficult time participating in assessment. Pt brother supports as much as pt brother able, but pt does not live with pt brother. Pt brother recognizes that it will be important to apply for Medicaid in order to get additional options in the future.   Patient/Family's Understanding of and Emotional Response to Diagnosis, Current Treatment, and Prognosis:  Pt brother displayed understanding surrounding pt diagnosis. Pt brother discussed that it is difficult at this time as pt lives  alone, but pt does not have financial means to pay privately at a facility at this time. Pt brother plans to apply for Medicaid. Plan will be for pt to return home with home health services including [place] [facility] to continue to assist with resources.  Emotional Assessment Appearance:  Appears stated age Attitude/Demeanor/Rapport:  Other (delayed response) Affect (typically observed):  Other (delayed response difficult to assess) Orientation:  Oriented to Self, Oriented to Place, Oriented to  Time Alcohol / Substance use:  Not Applicable Psych involvement (Current and /or in the community):  No (Comment)  Discharge Needs  Concerns to be addressed:  Discharge Planning Concerns Readmission within the last 30 days:  No Current discharge risk:  Lives alone Barriers to Discharge:  No Barriers Identified   [place], [place], LCSW 06/15/2015, 11:54 AM  [phone]

## 2015-06-15 NOTE — Plan of Care (Signed)
Problem: Safety: Goal: Ability to remain free from injury will improve Outcome: Not Progressing Pt continues to try to get oob alone despite repeated directions to call for help before getting up. Bed alarm on.  Lind Guest, RN

## 2015-06-15 NOTE — Evaluation (Signed)
Occupational Therapy Evaluation Patient Details Name: Edwin [name] MRN: [id number] DOB: 1956-06-01 Today's Date: 06/15/2015    History of Present Illness 59 yo male admitted with sepsis, UTI. s/p cystoscopy, bladder irrigation, foley replacement, aborted TURP 06/10/15. Hx of paranoid schizophrenia, ARF, catheter, prostate cancer.   Clinical Impression   Pt admitted with sepsis. Pt currently with functional limitations due to the deficits listed below (see OT Problem List).  Pt will benefit from skilled OT to increase their safety and independence with ADL and functional mobility for ADL to facilitate discharge to venue listed below.      Follow Up Recommendations  Home health OT;Supervision - Intermittent    Equipment Recommendations  None recommended by OT       Precautions / Restrictions Precautions Precautions: Fall Restrictions Weight Bearing Restrictions: No      Mobility Bed Mobility          I        Transfers            overall S with verbal cues for safety          Balance             Standing balance-Leahy Scale: Good                              ADL Overall ADL's : Needs assistance/impaired     Grooming: Wash/dry face;Sitting   Upper Body Bathing: Set up   Lower Body Bathing: Sit to/from stand   Upper Body Dressing : Set up;Sitting   Lower Body Dressing: Supervision/safety   Toilet Transfer: Supervision/safety;Ambulation           Functional mobility during ADLs: Minimal assistance                 Pertinent Vitals/Pain Pain Assessment: No/denies pain     Hand Dominance     Extremity/Trunk Assessment Upper Extremity Assessment Upper Extremity Assessment: Generalized weakness       Cervical / Trunk Assessment Cervical / Trunk Assessment: Normal   Communication Communication Communication: Other (comment) (very low speaking voice volume)   Cognition Arousal/Alertness:  Awake/alert Behavior During Therapy: WFL for tasks assessed/performed Overall Cognitive Status: Within Functional Limits for tasks assessed                     General Comments               Home Living Family/patient expects to be discharged to:: Unsure Living Arrangements: Alone Available Help at Discharge: Family Type of Home: House Home Access: Stairs to enter Technical brewer of Steps: 3-4   Home Layout: One level                          Prior Functioning/Environment Level of Independence: Needs assistance  Gait / Transfers Assistance Needed: pt stated he doesn't move very well          OT Diagnosis: Generalized weakness   OT Problem List: Decreased strength;Decreased activity tolerance   OT Treatment/Interventions: Self-care/ADL training;Patient/family education    OT Goals(Current goals can be found in the care plan section) Acute Rehab OT Goals Patient Stated Goal: get back home OT Goal Formulation: With patient Time For Goal Achievement: 06/29/15 Potential to Achieve Goals: Good  OT Frequency: Min 2X/week              End of Session  Nurse Communication: Mobility status  Activity Tolerance: Patient tolerated treatment well Patient left: with call bell/phone within reach   Time: [id number] OT Time Calculation (min): 17 min Charges:  OT General Charges $OT Visit: 1 Procedure OT Evaluation $Initial OT Evaluation Tier I: 1 Procedure G-Codes:    Betsy Pries Jul 04, 2015, 1:56 PM

## 2015-06-16 ENCOUNTER — Other Ambulatory Visit: Payer: Self-pay | Admitting: Urology

## 2015-06-16 LAB — BASIC METABOLIC PANEL
ANION GAP: 6 (ref 5–15)
BUN: 17 mg/dL (ref 6–20)
CALCIUM: 8.5 mg/dL — AB (ref 8.9–10.3)
CO2: 24 mmol/L (ref 22–32)
CREATININE: 1.87 mg/dL — AB (ref 0.61–1.24)
Chloride: 113 mmol/L — ABNORMAL HIGH (ref 101–111)
GFR calc non Af Amer: 38 mL/min — ABNORMAL LOW (ref >60)
GFR, EST AFRICAN AMERICAN: 44 mL/min — AB (ref >60)
Glucose, Bld: 96 mg/dL (ref 65–99)
Potassium: 3.8 mmol/L (ref 3.5–5.1)
SODIUM: 143 mmol/L (ref 135–145)

## 2015-06-16 LAB — CBC
HCT: 32.2 % — ABNORMAL LOW (ref 39.0–52.0)
HEMOGLOBIN: 10 g/dL — AB (ref 13.0–17.0)
MCH: 25.5 pg — AB (ref 26.0–34.0)
MCHC: 31.1 g/dL (ref 30.0–36.0)
MCV: 82.1 fL (ref 78.0–100.0)
PLATELETS: 596 10*3/uL — AB (ref 150–400)
RBC: 3.92 MIL/uL — AB (ref 4.22–5.81)
RDW: 18.8 % — ABNORMAL HIGH (ref 11.5–15.5)
WBC: 12.7 10*3/uL — AB (ref 4.0–10.5)

## 2015-06-16 MED ORDER — CIPROFLOXACIN HCL 500 MG PO TABS
500.0000 mg | ORAL_TABLET | Freq: Two times a day (BID) | ORAL | Status: DC
  Administered 2015-06-16 – 2015-06-17 (×3): 500 mg via ORAL
  Filled 2015-06-16 (×3): qty 1

## 2015-06-16 MED ORDER — ENSURE ENLIVE PO LIQD: 237.0000 mL | Freq: Three times a day (TID) | ORAL | Status: DC

## 2015-06-16 MED ORDER — ENSURE ENLIVE PO LIQD
237.0000 mL | Freq: Two times a day (BID) | ORAL | Status: DC
  Administered 2015-06-16 – 2015-06-17 (×3): 237 mL via ORAL

## 2015-06-16 NOTE — Progress Notes (Addendum)
Nutrition Follow-up  DOCUMENTATION CODES:   Non-severe (moderate) malnutrition in context of chronic illness  INTERVENTION:  - Continue Ensure Enlive BID - RD will continue to monitor for needs  NUTRITION DIAGNOSIS:   Unintentional weight loss related to acute illness as evidenced by percent weight loss. -ongoing  GOAL:   Patient will meet greater than or equal to 90% of their needs -met on average  MONITOR:   PO intake, Supplement acceptance, Weight trends, Labs, Skin, I & O's  ASSESSMENT:   Edwin [name] is a 59yo here today for TURP. He has a hx of ARF and had a foley placed several months ago. Creatinine came down to 1.6 prior to discharge in 03/2015. Creatinine today is 3.2. The patient has not been drinking and eating well for the past month. Pt is a poor historian  11/17 Pt has been eating 100% of meals on average since last assessment. Per chart review, pt has lost 2 lbs in the past 3 days; noted pt on 1500cc fluid restriction. Pt sleeping again at time of visit and again would not arouse to name call x3  Will continue to monitor for needs. Meeting needs on average at this time. Medications reviewed. Labs reviewed; CBGs: 115-179 mg/dL, Cl: 113 mmol/L, creatinine elevated but trending down, Ca: 8.5 mg/dL, Mg: 1.6 mg/dL, GFR: 44.   11/14 - Per chart review, pt ate 100% breakfast and 50% lunch yesterday (11/13) and 75% breakfast this AM. - Pt was covered in sheet over his head, sleeping soundly at time of RD visit.  - He did not arouse to name call x4 and no family/visitors in the room.  - RN states she did not see breakfast this AM.  - Empty Ensure Enlive bottle was noted on bedside table.  - Per chart review, pt has lost 20 lbs (11% body weight) in the past 3 months.  Diet Order:  Diet regular Room service appropriate?: Yes; Fluid consistency:: Thin  Skin:  Wound (see comment) (Incision to penis)  Last BM:  11/17  Height:   Ht Readings from Last 1 Encounters:   06/10/15 5' 11"  (1.803 m)    Weight:   Wt Readings from Last 1 Encounters:  06/16/15 154 lb 8.7 oz (70.1 kg)    Ideal Body Weight:  78.18 kg (kg)  BMI:  Body mass index is 21.56 kg/(m^2).  Estimated Nutritional Needs:   Kcal:  3338-3291  Protein:  70-80 grams  Fluid:  2-2.2 L/day  EDUCATION NEEDS:   No education needs identified at this time     Jarome Matin, RD, LDN Inpatient Clinical Dietitian Pager # [phone] After hours/weekend pager # [phone]

## 2015-06-16 NOTE — Progress Notes (Signed)
Pharmacy Antibiotic Time-Out Note  Edwin [name] is a 59 y.o. year-old male admitted on 06/10/2015.  The patient is currently on Cipro for Serratia UTI.  Assessment/Plan: After discussion with Dr. Alyson Ingles, the dose of Cipro will be adjusted to 500mg  PO BID based on renal function.   Recent Labs Lab 06/11/15 0236 06/12/15 0410 06/13/15 0510 06/15/15 0459 06/16/15 0505  WBC 19.9* 22.7* 18.1* 14.1* 12.7*    Recent Labs Lab 06/11/15 0236 06/12/15 0410 06/13/15 0510 06/15/15 0459 06/16/15 0505  CREATININE 2.88* 2.59* 2.43* 1.99* 1.87*   Estimated Creatinine Clearance: 43.2 mL/min (by C-G formula based on Cr of 1.87).  Tmax/24h: 97.8, afebrile  Antimicrobial allergies: NKDA  Antimicrobials this admission: 11/11 >> Vancomycin >> 11/13 11/11 >> Zosyn >> 11/13 11/13 >> Cipro >>   Microbiology Results: 11/11 BCx: NGTD 11/11 UCx: > 100k Serratia marcescens (R-Ancef, NTF; S-CTX, FQ, Bactrim) - did not run any 2G cephs to ID Amp-C presence 11/11 MRSA PCR: negative  Thank you for allowing pharmacy to be a part of this patient's care.  Gretta Arab PharmD, BCPS Pager [phone] 06/16/2015 10:04 AM

## 2015-06-16 NOTE — Progress Notes (Signed)
6 Days Post-Op Subjective: Patient reports no pain. He has been eating regular diet. His WBC count decreased to 12.7 and creatinine improved to 1.87.   Objective: Vital signs in last 24 hours: Temp:  [97.2 F (36.2 C)-97.4 F (36.3 C)] 97.3 F (36.3 C) (11/17 1300) Pulse Rate:  [84-88] 84 (11/17 1300) Resp:  [18] 18 (11/17 1300) BP: (100-104)/(60-65) 104/64 mmHg (11/17 1300) SpO2:  [98 %-99 %] 99 % (11/17 1300) Weight:  [70.1 kg (154 lb 8.7 oz)] 70.1 kg (154 lb 8.7 oz) (11/17 0524)  Intake/Output from previous day: 11/16 0701 - 11/17 0700 In: 3127 [P.O.:1377; I.V.:1750] Out: 3350 [Urine:3350] Intake/Output this shift: Total I/O In: -  Out: 1200 [Urine:1200]  Physical Exam:  General:alert, cooperative and appears older than stated age GI: soft, non tender, normal bowel sounds, no palpable masses, no organomegaly, no inguinal hernia Male genitalia: not done Extremities: extremities normal, atraumatic, no cyanosis or edema  Lab Results:  Recent Labs  06/15/15 0459 06/16/15 0505  HGB 9.4* 10.0*  HCT 30.8* 32.2*   BMET  Recent Labs  06/15/15 0459 06/16/15 0505  NA 142 143  K 3.6 3.8  CL 113* 113*  CO2 23 24  GLUCOSE 100* 96  BUN 17 17  CREATININE 1.99* 1.87*  CALCIUM 8.2* 8.5*   No results for input(s): LABPT, INR in the last 72 hours. No results for input(s): LABURIN in the last 72 hours. Results for orders placed or performed during the hospital encounter of 06/10/15  Urine culture     Status: None   Collection Time: 06/10/15 11:49 AM  Result Value Ref Range Status   Specimen Description URINE, CATHETERIZED CYSTOSCOPY  Final   Special Requests NONE  Final   Culture   Final    >=100,000 COLONIES/mL SERRATIA MARCESCENS Performed at [facility]    Report Status 06/13/2015 FINAL  Final   Organism ID, Bacteria SERRATIA MARCESCENS  Final      Susceptibility   Serratia marcescens - MIC*    CEFAZOLIN >=64 RESISTANT Resistant     CEFTRIAXONE <=1  SENSITIVE Sensitive     CIPROFLOXACIN <=0.25 SENSITIVE Sensitive     GENTAMICIN <=1 SENSITIVE Sensitive     NITROFURANTOIN 128 RESISTANT Resistant     TRIMETH/SULFA <=20 SENSITIVE Sensitive     * >=100,000 COLONIES/mL SERRATIA MARCESCENS  MRSA PCR Screening     Status: None   Collection Time: 06/10/15  2:48 PM  Result Value Ref Range Status   MRSA by PCR NEGATIVE NEGATIVE Final    Comment:        The GeneXpert MRSA Assay (FDA approved for NASAL specimens only), is one component of a comprehensive MRSA colonization surveillance program. It is not intended to diagnose MRSA infection nor to guide or monitor treatment for MRSA infections.   Culture, blood (routine x 2)     Status: None   Collection Time: 06/10/15  7:00 PM  Result Value Ref Range Status   Specimen Description BLOOD LEFT ARM  Final   Special Requests IN PEDIATRIC BOTTLE  [place]  Final   Culture   Final    NO GROWTH 5 DAYS Performed at [facility]    Report Status 06/15/2015 FINAL  Final  Culture, blood (routine x 2)     Status: None   Collection Time: 06/10/15  7:10 PM  Result Value Ref Range Status   Specimen Description BLOOD LEFT HAND  Final   Special Requests BOTTLES DRAWN AEROBIC AND ANAEROBIC  [place]  Final   Culture   Final    NO GROWTH 5 DAYS Performed at [facility]    Report Status 06/15/2015 FINAL  Final    Studies/Results: No results found.  Assessment/Plan: 59yo with BPH with LUTS, urinary retention, ARF, UTI, protein calory malnutrition  1. Continue foley catheter to straight drain 2. Continue cipro for UTI 3. Awaiting home health approval    LOS: 6 days   Spiros Greenfeld L 06/16/2015, 4:45 PM

## 2015-06-16 NOTE — Progress Notes (Signed)
Occupational Therapy Treatment Patient Details Name: Edwin [name] MRN: [id number] DOB: 01-Mar-1956 Today's Date: 06/16/2015    History of present illness 59 yo male admitted with sepsis, UTI. s/p cystoscopy, bladder irrigation, foley replacement, aborted TURP 06/10/15. Hx of paranoid schizophrenia, ARF, catheter, prostate cancer.   OT comments  Pt very participative this day in his grooming!  Follow Up Recommendations  Home health OT;Supervision - Intermittent;SNF    Equipment Recommendations  None recommended by OT    Recommendations for Other Services      Precautions / Restrictions Precautions Precautions: Fall Restrictions Weight Bearing Restrictions: No       Mobility Bed Mobility Overal bed mobility: Modified Independent                Transfers Overall transfer level: Needs assistance     Sit to Stand: Supervision         General transfer comment: for safety, catheter         ADL       Grooming: Supervision/safety;Standing;Oral care       Lower Body Bathing: Sit to/from stand;Min guard           Toilet Transfer: Supervision/safety;Ambulation   Toileting- Clothing Manipulation and Hygiene: Supervision/safety;Sit to/from stand       Functional mobility during ADLs: Min guard                  Cognition   Behavior During Therapy: WFL for tasks assessed/performed Overall Cognitive Status: Within Functional Limits for tasks assessed                                    Pertinent Vitals/ Pain       Pain Assessment: No/denies pain         Frequency Min 2X/week     Progress Toward Goals  OT Goals(current goals can now be found in the care plan section)  Progress towards OT goals: Progressing toward goals     Plan Discharge plan remains appropriate       End of Session     Activity Tolerance Patient tolerated treatment well   Patient Left in chair;with call bell/phone within reach;with chair alarm  set   Nurse Communication Mobility status        Time: 1330-1406 OT Time Calculation (min): 36 min  Charges: OT General Charges $OT Visit: 1 Procedure OT Treatments $Self Care/Home Management : 23-37 mins  Edwin [name], Thereasa Parkin 06/16/2015, 2:10 PM

## 2015-06-16 NOTE — Progress Notes (Signed)
Physical Therapy Treatment Patient Details Name: KUSHAL MAGOON MRN: [id number] DOB: July 24, 1956 Today's Date: 06/16/2015    History of Present Illness 59 yo male admitted with sepsis, UTI. s/p cystoscopy, bladder irrigation, foley replacement, aborted TURP 06/10/15. Hx of paranoid schizophrenia, ARF, catheter, prostate cancer.    PT Comments    Progressing with mobility.   Follow Up Recommendations  Home health PT;Supervision - Intermittent     Equipment Recommendations  None recommended by PT    Recommendations for Other Services       Precautions / Restrictions Precautions Precautions: Fall Restrictions Weight Bearing Restrictions: No    Mobility  Bed Mobility Overal bed mobility: Modified Independent                Transfers       Sit to Stand: Supervision         General transfer comment: for safety, catheter   Ambulation/Gait Ambulation/Gait assistance: Supervision Ambulation Distance (Feet): 1000 Feet Assistive device: None Gait Pattern/deviations: Step-through pattern;Drifts right/left     General Gait Details: supervision for safety. 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[employer] (Stroke Patients Only)       Balance                                    Cognition Arousal/Alertness: Awake/alert Behavior During Therapy: WFL for tasks assessed/performed Overall Cognitive Status: Within Functional Limits for tasks assessed                      Exercises      General Comments        Pertinent Vitals/Pain Pain Assessment: No/denies pain    Home Living                      Prior Function            PT Goals (current goals can now be found in the care plan section) Progress towards PT goals: Progressing toward goals    Frequency  Min 3X/week    PT Plan Current plan remains appropriate    Co-evaluation              End of Session Equipment Utilized During Treatment: Gait belt Activity Tolerance: Patient tolerated treatment well Patient left: in bed;with call bell/phone within reach;with bed alarm set     Time: 1005-1018 PT Time Calculation (min) (ACUTE ONLY): 13 min  Charges:  $Gait Training: 8-22 mins                    G Codes:      Weston Anna, MPT Pager: [phone]

## 2015-06-17 LAB — CBC WITH DIFFERENTIAL/PLATELET
BASOS ABS: 0 10*3/uL (ref 0.0–0.1)
Basophils Relative: 0 %
EOS PCT: 3 %
Eosinophils Absolute: 0.3 10*3/uL (ref 0.0–0.7)
HEMATOCRIT: 31.9 % — AB (ref 39.0–52.0)
HEMOGLOBIN: 9.9 g/dL — AB (ref 13.0–17.0)
LYMPHS ABS: 1.4 10*3/uL (ref 0.7–4.0)
LYMPHS PCT: 14 %
MCH: 25.4 pg — AB (ref 26.0–34.0)
MCHC: 31 g/dL (ref 30.0–36.0)
MCV: 81.8 fL (ref 78.0–100.0)
Monocytes Absolute: 0.4 10*3/uL (ref 0.1–1.0)
Monocytes Relative: 4 %
NEUTROS ABS: 8.3 10*3/uL — AB (ref 1.7–7.7)
NEUTROS PCT: 79 %
Platelets: 535 10*3/uL — ABNORMAL HIGH (ref 150–400)
RBC: 3.9 MIL/uL — AB (ref 4.22–5.81)
RDW: 18.9 % — AB (ref 11.5–15.5)
WBC: 10.4 10*3/uL (ref 4.0–10.5)

## 2015-06-17 MED ORDER — CIPROFLOXACIN HCL 500 MG PO TABS
500.0000 mg | ORAL_TABLET | Freq: Two times a day (BID) | ORAL | Status: DC
Start: 2015-06-17 — End: 2016-07-05

## 2015-06-17 NOTE — Progress Notes (Signed)
MD gave clarification prior to D/C to keep patient's foley catheter in place. Patient discharged with foley catheter intact as ordered. No complications.

## 2015-06-17 NOTE — Discharge Instructions (Signed)
Foley Catheter Care, Adult °A Foley catheter is a soft, flexible tube that is placed into the bladder to drain urine. A Foley catheter may be inserted if: °· You leak urine or are not able to control when you urinate (urinary incontinence). °· You are not able to urinate when you need to (urinary retention). °· You had prostate surgery or surgery on the genitals. °· You have certain medical conditions, such as multiple sclerosis, dementia, or a spinal cord injury. °If you are going home with a Foley catheter in place, follow the instructions below. °TAKING CARE OF THE CATHETER °1. Wash your hands with soap and water. °2. Using mild soap and warm water on a clean washcloth: °¨ Clean the area on your body closest to the catheter insertion site using a circular motion, moving away from the catheter. Never wipe toward the catheter because this could sweep bacteria up into the urethra and cause infection. °¨ Remove all traces of soap. Pat the area dry with a clean towel. For males, reposition the foreskin. °3. Attach the catheter to your leg so there is no tension on the catheter. Use adhesive tape or a leg strap. If you are using adhesive tape, remove any sticky residue left behind by the previous tape you used. °4. Keep the drainage bag below the level of the bladder, but keep it off the floor. °5. Check throughout the day to be sure the catheter is working and urine is draining freely. Make sure the tubing does not become kinked. °6. Do not pull on the catheter or try to remove it. Pulling could damage internal tissues. °TAKING CARE OF THE DRAINAGE BAGS °You will be given two drainage bags to take home. One is a large overnight drainage bag, and the other is a smaller leg bag that fits underneath clothing. You may wear the overnight bag at any time, but you should never wear the smaller leg bag at night. Follow the instructions below for how to empty, change, and clean your drainage bags. °Emptying the Drainage  Bag °You must empty your drainage bag when it is  -½ full or at least 2-3 times a day. °1. Wash your hands with soap and water. °2. Keep the drainage bag below your hips, below the level of your bladder. This stops urine from going back into the tubing and into your bladder. °3. Hold the dirty bag over the toilet or a clean container. °4. Open the pour spout at the bottom of the bag and empty the urine into the toilet or container. Do not let the pour spout touch the toilet, container, or any other surface. Doing so can place bacteria on the bag, which can cause an infection. °5. Clean the pour spout with a gauze pad or cotton ball that has rubbing alcohol on it. °6. Close the pour spout. °7. Attach the bag to your leg with adhesive tape or a leg strap. °8. Wash your hands well. °Changing the Drainage Bag °Change your drainage bag once a month or sooner if it starts to smell bad or look dirty. Below are steps to follow when changing the drainage bag. °1. Wash your hands with soap and water. °2. Pinch off the rubber catheter so that urine does not spill out. °3. Disconnect the catheter tube from the drainage tube at the connection valve. Do not let the tubes touch any surface. °4. Clean the end of the catheter tube with an alcohol wipe. Use a different alcohol wipe to clean   the end of the drainage tube. °5. Connect the catheter tube to the drainage tube of the clean drainage bag. °6. Attach the new bag to the leg with adhesive tape or a leg strap. Avoid attaching the new bag too tightly. °7. Wash your hands well. °Cleaning the Drainage Bag °1. Wash your hands with soap and water. °2. Wash the bag in warm, soapy water. °3. Rinse the bag thoroughly with warm water. °4. Fill the bag with a solution of white vinegar and water (1 cup vinegar to 1 qt warm water [.2 L vinegar to 1 L warm water]). Close the bag and soak it for 30 minutes in the solution. °5. Rinse the bag with warm water. °6. Hang the bag to dry with the  pour spout open and hanging downward. °7. Store the clean bag (once it is dry) in a clean plastic bag. °8. Wash your hands well. °PREVENTING INFECTION °· Wash your hands before and after handling your catheter. °· Take showers daily and wash the area where the catheter enters your body. Do not take baths. Replace wet leg straps with dry ones, if this applies. °· Do not use powders, sprays, or lotions on the genital area. Only use creams, lotions, or ointments as directed by your caregiver. °· For females, wipe from front to back after each bowel movement. °· Drink enough fluids to keep your urine clear or pale yellow unless you have a fluid restriction. °· Do not let the drainage bag or tubing touch or lie on the floor. °· Wear cotton underwear to absorb moisture and to keep your skin drier. °SEEK MEDICAL CARE IF:  °· Your urine is cloudy or smells unusually bad. °· Your catheter becomes clogged. °· You are not draining urine into the bag or your bladder feels full. °· Your catheter starts to leak. °SEEK IMMEDIATE MEDICAL CARE IF:  °· You have pain, swelling, redness, or pus where the catheter enters the body. °· You have pain in the abdomen, legs, lower back, or bladder. °· You have a fever. °· You see blood fill the catheter, or your urine is pink or red. °· You have nausea, vomiting, or chills. °· Your catheter gets pulled out. °MAKE SURE YOU:  °· Understand these instructions. °· Will watch your condition. °· Will get help right away if you are not doing well or get worse. °  °This information is not intended to replace advice given to you by your health care provider. Make sure you discuss any questions you have with your health care provider. °  °Document Released: 07/16/2005 Document Revised: 11/30/2013 Document Reviewed: 07/07/2012 °Elsevier Interactive Patient Education ©2016 [employer] ° °

## 2015-06-17 NOTE — Progress Notes (Signed)
Edwin Jacinto S. RN went over all discharge paperwork and medications with patient and brother.  Discharge summary and prescription given to patient/family. All questions answered.  VSS.  Pt wheeled out by NT.

## 2015-06-17 NOTE — Care Management Important Message (Signed)
Important Message  Patient Details  Name: LEIGHTON RODNEY MRN: [id number] Date of Birth: 1955/09/16   Medicare Important Message Given:  Yes    Shelda Altes 06/17/2015, 12:39 [place] Message  Patient Details  Name: ORVAL GATLEY MRN: [id number] Date of Birth: 11-Jan-1956   Medicare Important Message Given:  Yes    Shelda Altes 06/17/2015, 12:38 PM

## 2015-06-17 NOTE — Progress Notes (Addendum)
Spoke with pt concerning HH, pt had no preference. [facility] was selected, referral given to in house rep.  Pt states, "no to SNF." Pt continued to states I am going home.

## 2015-06-21 ENCOUNTER — Encounter (HOSPITAL_BASED_OUTPATIENT_CLINIC_OR_DEPARTMENT_OTHER): Payer: Self-pay | Admitting: *Deleted

## 2015-06-21 NOTE — Progress Notes (Signed)
SPOKE W/ BROTHER, PT HAS PARANOID SCHIZOPHENIA.  NPO AFTER MN.  ARRIVE AT 1115.  CURRENT LAB RESULTS IN CHART AND EPIC.  REVIEWED [facility] GUIDELINES, WILL BRING MEDS.

## 2015-06-24 NOTE — Discharge Summary (Signed)
Physician Discharge Summary  Patient ID: Edwin [name] MRN: [id number] DOB/AGE: 1955/09/15 59 y.o.  Admit date: 06/10/2015 Discharge date: 06/17/2015 Admission Diagnoses: sepsis from a urinary source, protein calorie malnutrition  Discharge Diagnoses:  Active Problems:   UTI (lower urinary tract infection)   Malnutrition of moderate degree   Discharged Condition: good  Hospital Course: The patient was brought to the operating room for a TURP but was noted to have purulent urine and was found to be in renal failure. The bladder was then irrigated and a new foley catheter was placed. The patient tolerated the procedure well and was transferred to the ICU due to the concern for sepsis and acute renal failure. Urine culture grew serratia which was sensitive to cipro. Creatinine normalized by POD#3. On POD#2 with patient was transferred to the floor. Dietary was consulted due to concern for malnutrition. Pt continued to take good PO intake during his hospital stay. On POD#6 the patient was transitioned to a regular diet, IVFs were discontinued, and the patient passed flatus. Prior to discharge the pt was tolerating a regular diet, pain was controlled on PO pain meds, they were ambulating without difficulty, and they had normal bowel function.   Consults: pulmonary/intensive care and dietary  Significant Diagnostic Studies: none  Treatments: IV hydration, antibiotics: Zosyn, therapies: PT, OT and SW and surgery: cystoscopy  Discharge Exam: Blood pressure 114/70, pulse 92, temperature 98.3 F (36.8 C), temperature source Oral, resp. rate 20, height 5\' 11"  (1.803 m), weight 69.4 kg (153 lb), SpO2 98 %. General appearance: alert, cooperative and appears stated age Head: Normocephalic, without obvious abnormality, atraumatic Eyes: conjunctivae/corneas clear. PERRL, EOM's intact. Fundi benign. Neck: no adenopathy, no carotid bruit, no JVD, supple, symmetrical, trachea midline and thyroid not  enlarged, symmetric, no tenderness/mass/nodules Resp: clear to auscultation bilaterally Cardio: regular rate and rhythm, S1, S2 normal, no murmur, click, rub or gallop GI: soft, non-tender; bowel sounds normal; no masses,  no organomegaly Male genitalia: normal Extremities: extremities normal, atraumatic, no cyanosis or edema Neurologic: Alert and oriented X 3, normal strength and tone. Normal symmetric reflexes. Normal coordination and gait  Disposition: 01-Home or Self Care     Medication List    TAKE these medications        acetaminophen 500 MG tablet  Commonly known as:  TYLENOL  Take 1,000 mg by mouth every 4 (four) hours as needed for moderate pain or headache.     aspirin EC 325 MG tablet  Take 325 mg by mouth every 6 (six) hours as needed.     benztropine 0.5 MG tablet  Commonly known as:  COGENTIN  Take 0.5 mg by mouth every morning.     ciprofloxacin 500 MG tablet  Commonly known as:  CIPRO  Take 1 tablet (500 mg total) by mouth 2 (two) times daily.     cloZAPine 100 MG tablet  Commonly known as:  CLOZARIL  Take 800 mg by mouth at bedtime.     simvastatin 20 MG tablet  Commonly known as:  ZOCOR  Take 20 mg by mouth daily.     tamsulosin 0.4 MG Caps capsule  Commonly known as:  FLOMAX  Take 0.4 mg by mouth daily.         SignedCleon Gustin 06/24/2015, 2:27 PM

## 2015-06-27 ENCOUNTER — Ambulatory Visit (HOSPITAL_BASED_OUTPATIENT_CLINIC_OR_DEPARTMENT_OTHER): Payer: Medicare Other | Admitting: Anesthesiology

## 2015-06-27 ENCOUNTER — Observation Stay (HOSPITAL_BASED_OUTPATIENT_CLINIC_OR_DEPARTMENT_OTHER)
Admission: RE | Admit: 2015-06-27 | Discharge: 2015-06-28 | Disposition: A | Discharge status: Home / Self Care | Payer: Medicare Other | Source: Ambulatory Visit | Attending: Urology | Admitting: Urology

## 2015-06-27 ENCOUNTER — Encounter (HOSPITAL_BASED_OUTPATIENT_CLINIC_OR_DEPARTMENT_OTHER): Payer: Self-pay | Admitting: Anesthesiology

## 2015-06-27 ENCOUNTER — Encounter (HOSPITAL_COMMUNITY)
Admission: RE | Disposition: A | Discharge status: Home / Self Care | Payer: Self-pay | Source: Ambulatory Visit | Attending: Urology

## 2015-06-27 DIAGNOSIS — N4231 Prostatic intraepithelial neoplasia: Secondary | ICD-10-CM | POA: Diagnosis not present

## 2015-06-27 DIAGNOSIS — N183 Chronic kidney disease, stage 3 (moderate): Secondary | ICD-10-CM | POA: Insufficient documentation

## 2015-06-27 DIAGNOSIS — F329 Major depressive disorder, single episode, unspecified: Secondary | ICD-10-CM | POA: Diagnosis not present

## 2015-06-27 DIAGNOSIS — E785 Hyperlipidemia, unspecified: Secondary | ICD-10-CM | POA: Diagnosis not present

## 2015-06-27 DIAGNOSIS — G709 Myoneural disorder, unspecified: Secondary | ICD-10-CM | POA: Insufficient documentation

## 2015-06-27 DIAGNOSIS — F419 Anxiety disorder, unspecified: Secondary | ICD-10-CM | POA: Insufficient documentation

## 2015-06-27 DIAGNOSIS — Z7982 Long term (current) use of aspirin: Secondary | ICD-10-CM | POA: Diagnosis not present

## 2015-06-27 DIAGNOSIS — F17210 Nicotine dependence, cigarettes, uncomplicated: Secondary | ICD-10-CM | POA: Insufficient documentation

## 2015-06-27 DIAGNOSIS — N401 Enlarged prostate with lower urinary tract symptoms: Secondary | ICD-10-CM | POA: Diagnosis present

## 2015-06-27 DIAGNOSIS — F200 Paranoid schizophrenia: Secondary | ICD-10-CM | POA: Insufficient documentation

## 2015-06-27 DIAGNOSIS — K219 Gastro-esophageal reflux disease without esophagitis: Secondary | ICD-10-CM | POA: Insufficient documentation

## 2015-06-27 DIAGNOSIS — R339 Retention of urine, unspecified: Secondary | ICD-10-CM | POA: Insufficient documentation

## 2015-06-27 DIAGNOSIS — N400 Enlarged prostate without lower urinary tract symptoms: Secondary | ICD-10-CM | POA: Diagnosis present

## 2015-06-27 HISTORY — PX: TRANSURETHRAL RESECTION OF PROSTATE: SHX73

## 2015-06-27 LAB — CBC
HCT: 34.9 % — ABNORMAL LOW (ref 39.0–52.0)
Hemoglobin: 10.8 g/dL — ABNORMAL LOW (ref 13.0–17.0)
MCH: 25.7 pg — AB (ref 26.0–34.0)
MCHC: 30.9 g/dL (ref 30.0–36.0)
MCV: 83.1 fL (ref 78.0–100.0)
PLATELETS: 377 10*3/uL (ref 150–400)
RBC: 4.2 MIL/uL — ABNORMAL LOW (ref 4.22–5.81)
RDW: 20.6 % — AB (ref 11.5–15.5)
WBC: 12.1 10*3/uL — ABNORMAL HIGH (ref 4.0–10.5)

## 2015-06-27 LAB — BASIC METABOLIC PANEL
Anion gap: 8 (ref 5–15)
BUN: 21 mg/dL — AB (ref 6–20)
CALCIUM: 8.9 mg/dL (ref 8.9–10.3)
CO2: 24 mmol/L (ref 22–32)
CREATININE: 2.12 mg/dL — AB (ref 0.61–1.24)
Chloride: 111 mmol/L (ref 101–111)
GFR calc Af Amer: 38 mL/min — ABNORMAL LOW (ref >60)
GFR, EST NON AFRICAN AMERICAN: 33 mL/min — AB (ref >60)
GLUCOSE: 134 mg/dL — AB (ref 65–99)
POTASSIUM: 4.1 mmol/L (ref 3.5–5.1)
Sodium: 143 mmol/L (ref 135–145)

## 2015-06-27 SURGERY — TRANSURETHRAL RESECTION OF THE PROSTATE WITH GYRUS INSTRUMENTS
Anesthesia: General | Site: Prostate

## 2015-06-27 MED ORDER — FENTANYL CITRATE (PF) 100 MCG/2ML IJ SOLN
INTRAMUSCULAR | Status: DC | PRN
  Administered 2015-06-27: 25 ug via INTRAVENOUS
  Administered 2015-06-27: 50 ug via INTRAVENOUS

## 2015-06-27 MED ORDER — MIDAZOLAM HCL 2 MG/2ML IJ SOLN
INTRAMUSCULAR | Status: AC
  Filled 2015-06-27: qty 2

## 2015-06-27 MED ORDER — OXYCODONE-ACETAMINOPHEN 5-325 MG PO TABS: 1.0000 | ORAL_TABLET | ORAL | Status: DC | PRN

## 2015-06-27 MED ORDER — EPHEDRINE SULFATE 50 MG/ML IJ SOLN
INTRAMUSCULAR | Status: AC
  Filled 2015-06-27: qty 1

## 2015-06-27 MED ORDER — ZOLPIDEM TARTRATE 5 MG PO TABS
5.0000 mg | ORAL_TABLET | Freq: Every evening | ORAL | Status: DC | PRN
  Administered 2015-06-28 (×2): 5 mg via ORAL
  Filled 2015-06-27 (×2): qty 1

## 2015-06-27 MED ORDER — SIMVASTATIN 20 MG PO TABS
20.0000 mg | ORAL_TABLET | Freq: Every day | ORAL | Status: DC
  Administered 2015-06-28: 20 mg via ORAL
  Filled 2015-06-27: qty 1

## 2015-06-27 MED ORDER — PIPERACILLIN-TAZOBACTAM 3.375 G IVPB 30 MIN
3.3750 g | INTRAVENOUS | Status: AC
  Administered 2015-06-27: 3.375 g via INTRAVENOUS
  Filled 2015-06-27 (×2): qty 50

## 2015-06-27 MED ORDER — PHENYLEPHRINE 40 MCG/ML (10ML) SYRINGE FOR IV PUSH (FOR BLOOD PRESSURE SUPPORT)
PREFILLED_SYRINGE | INTRAVENOUS | Status: AC
  Filled 2015-06-27: qty 10

## 2015-06-27 MED ORDER — ENSURE ENLIVE PO LIQD
237.0000 mL | Freq: Two times a day (BID) | ORAL | Status: DC
  Administered 2015-06-27: 237 mL via ORAL

## 2015-06-27 MED ORDER — ACETAMINOPHEN 500 MG PO TABS
ORAL_TABLET | ORAL | Status: AC
  Filled 2015-06-27: qty 2

## 2015-06-27 MED ORDER — SODIUM CHLORIDE 0.9 % IV SOLN
INTRAVENOUS | Status: DC
  Administered 2015-06-27 – 2015-06-28 (×3): via INTRAVENOUS

## 2015-06-27 MED ORDER — PIPERACILLIN-TAZOBACTAM 3.375 G IVPB
3.3750 g | Freq: Three times a day (TID) | INTRAVENOUS | Status: AC
  Administered 2015-06-27 – 2015-06-28 (×3): 3.375 g via INTRAVENOUS
  Filled 2015-06-27 (×3): qty 50

## 2015-06-27 MED ORDER — ACETAMINOPHEN 500 MG PO TABS
1000.0000 mg | ORAL_TABLET | Freq: Four times a day (QID) | ORAL | Status: DC | PRN
  Administered 2015-06-27: 1000 mg via ORAL
  Filled 2015-06-27: qty 2

## 2015-06-27 MED ORDER — ACETAMINOPHEN 325 MG PO TABS: 650.0000 mg | ORAL_TABLET | ORAL | Status: DC | PRN

## 2015-06-27 MED ORDER — LACTATED RINGERS IV SOLN
INTRAVENOUS | Status: DC
  Administered 2015-06-27 (×2): via INTRAVENOUS
  Filled 2015-06-27: qty 1000

## 2015-06-27 MED ORDER — LIDOCAINE HCL (CARDIAC) 20 MG/ML IV SOLN
INTRAVENOUS | Status: AC
Start: 2015-06-27 — End: 2015-06-27
  Filled 2015-06-27: qty 5

## 2015-06-27 MED ORDER — HYDROMORPHONE HCL 1 MG/ML IJ SOLN
0.5000 mg | INTRAMUSCULAR | Status: DC | PRN
  Administered 2015-06-28: 1 mg via INTRAVENOUS
  Filled 2015-06-27: qty 1

## 2015-06-27 MED ORDER — ONDANSETRON HCL 4 MG/2ML IJ SOLN
INTRAMUSCULAR | Status: AC
  Filled 2015-06-27: qty 2

## 2015-06-27 MED ORDER — DEXAMETHASONE SODIUM PHOSPHATE 10 MG/ML IJ SOLN
INTRAMUSCULAR | Status: AC
  Filled 2015-06-27: qty 1

## 2015-06-27 MED ORDER — FENTANYL CITRATE (PF) 100 MCG/2ML IJ SOLN
INTRAMUSCULAR | Status: AC
  Filled 2015-06-27: qty 2

## 2015-06-27 MED ORDER — SODIUM CHLORIDE 0.9 % IV SOLN
20.0000 mg | INTRAVENOUS | Status: DC | PRN
  Administered 2015-06-27: 100 ug/min via INTRAVENOUS

## 2015-06-27 MED ORDER — BENZTROPINE MESYLATE 0.5 MG PO TABS
0.5000 mg | ORAL_TABLET | Freq: Every morning | ORAL | Status: DC
  Administered 2015-06-28: 0.5 mg via ORAL
  Filled 2015-06-27: qty 1

## 2015-06-27 MED ORDER — ONDANSETRON HCL 4 MG/2ML IJ SOLN
INTRAMUSCULAR | Status: DC | PRN
  Administered 2015-06-27: 4 mg via INTRAVENOUS

## 2015-06-27 MED ORDER — DEXAMETHASONE SODIUM PHOSPHATE 4 MG/ML IJ SOLN
INTRAMUSCULAR | Status: DC | PRN
  Administered 2015-06-27: 10 mg via INTRAVENOUS

## 2015-06-27 MED ORDER — FENTANYL CITRATE (PF) 100 MCG/2ML IJ SOLN
25.0000 ug | INTRAMUSCULAR | Status: DC | PRN
  Filled 2015-06-27: qty 1

## 2015-06-27 MED ORDER — PROPOFOL 10 MG/ML IV BOLUS
INTRAVENOUS | Status: AC
  Filled 2015-06-27: qty 20

## 2015-06-27 MED ORDER — EPHEDRINE SULFATE 50 MG/ML IJ SOLN
INTRAMUSCULAR | Status: DC | PRN
  Administered 2015-06-27: 10 mg via INTRAVENOUS
  Administered 2015-06-27: 15 mg via INTRAVENOUS

## 2015-06-27 MED ORDER — PROPOFOL 10 MG/ML IV BOLUS
INTRAVENOUS | Status: DC | PRN
Start: 2015-06-27 — End: 2015-06-27
  Administered 2015-06-27: 150 mg via INTRAVENOUS

## 2015-06-27 MED ORDER — DIPHENHYDRAMINE HCL 12.5 MG/5ML PO ELIX: 12.5000 mg | ORAL_SOLUTION | Freq: Four times a day (QID) | ORAL | Status: DC | PRN

## 2015-06-27 MED ORDER — MIDAZOLAM HCL 5 MG/5ML IJ SOLN
INTRAMUSCULAR | Status: DC | PRN
  Administered 2015-06-27: 2 mg via INTRAVENOUS

## 2015-06-27 MED ORDER — DIPHENHYDRAMINE HCL 50 MG/ML IJ SOLN: 12.5000 mg | Freq: Four times a day (QID) | INTRAMUSCULAR | Status: DC | PRN

## 2015-06-27 MED ORDER — LIDOCAINE HCL (CARDIAC) 20 MG/ML IV SOLN
INTRAVENOUS | Status: DC | PRN
  Administered 2015-06-27: 60 mg via INTRAVENOUS

## 2015-06-27 MED ORDER — PHENYLEPHRINE HCL 10 MG/ML IJ SOLN
INTRAMUSCULAR | Status: DC | PRN
  Administered 2015-06-27 (×2): 80 ug via INTRAVENOUS
  Administered 2015-06-27: 40 ug via INTRAVENOUS
  Administered 2015-06-27 (×2): 80 ug via INTRAVENOUS
  Administered 2015-06-27: 40 ug via INTRAVENOUS

## 2015-06-27 MED ORDER — PROMETHAZINE HCL 25 MG/ML IJ SOLN
6.2500 mg | INTRAMUSCULAR | Status: DC | PRN
  Filled 2015-06-27: qty 1

## 2015-06-27 MED ORDER — CLOZAPINE 100 MG PO TABS
800.0000 mg | ORAL_TABLET | Freq: Every day | ORAL | Status: DC
  Administered 2015-06-27: 800 mg via ORAL
  Filled 2015-06-27: qty 8

## 2015-06-27 MED ORDER — ONDANSETRON HCL 4 MG/2ML IJ SOLN: 4.0000 mg | INTRAMUSCULAR | Status: DC | PRN

## 2015-06-27 MED ORDER — BELLADONNA ALKALOIDS-OPIUM 16.2-60 MG RE SUPP: 1.0000 | Freq: Four times a day (QID) | RECTAL | Status: DC | PRN

## 2015-06-27 MED ORDER — SODIUM CHLORIDE 0.9 % IR SOLN
Status: DC | PRN
  Administered 2015-06-27: 3000 mL via INTRAVESICAL
  Administered 2015-06-27: 6000 mL via INTRAVESICAL
  Administered 2015-06-27 (×3): 3000 mL via INTRAVESICAL

## 2015-06-27 SURGICAL SUPPLY — 17 items
BAG URINE DRAINAGE (UROLOGICAL SUPPLIES) ×2 IMPLANT
BAG URO CATCHER STRL LF (DRAPE) ×3 IMPLANT
CATH FOLEY 2WAY SLVR 30CC 22FR (CATHETERS) ×2 IMPLANT
CLOTH BEACON ORANGE TIMEOUT ST (SAFETY) ×3 IMPLANT
ELECT REM PT RETURN 9FT ADLT (ELECTROSURGICAL) ×3
ELECTRODE REM PT RTRN 9FT ADLT (ELECTROSURGICAL) ×1 IMPLANT
GLOVE BIO SURGEON STRL SZ8 (GLOVE) ×3 IMPLANT
GOWN STRL REUS W/ TWL LRG LVL3 (GOWN DISPOSABLE) ×1 IMPLANT
GOWN STRL REUS W/TWL LRG LVL3 (GOWN DISPOSABLE) ×6
HOLDER FOLEY CATH W/STRAP (MISCELLANEOUS) ×2 IMPLANT
IV NS IRRIG 3000ML ARTHROMATIC (IV SOLUTION) ×12 IMPLANT
KIT ROOM TURNOVER WOR (KITS) ×3 IMPLANT
LOOP CUT BIPOLAR 24F LRG (ELECTROSURGICAL) ×2 IMPLANT
MANIFOLD NEPTUNE II (INSTRUMENTS) ×2 IMPLANT
PACK CYSTO (CUSTOM PROCEDURE TRAY) ×3 IMPLANT
SYR 30ML LL (SYRINGE) ×2 IMPLANT
SYRINGE IRR TOOMEY STRL 70CC (SYRINGE) ×2 IMPLANT

## 2015-06-27 NOTE — Interval H&P Note (Signed)
History and Physical Interval Note:  06/27/2015 12:54 PM  Edwin [name]  has presented today for surgery, with the diagnosis of BENIGH PROSTATIC HYPERPLASIA, RETENTION  The various methods of treatment have been discussed with the patient and family. After consideration of risks, benefits and other options for treatment, the patient has consented to  Procedure(s): TRANSURETHRAL RESECTION OF THE PROSTATE WITH GYRUS INSTRUMENTS (N/A) as a surgical intervention .  The patient's history has been reviewed, patient examined, no change in status, stable for surgery.  I have reviewed the patient's chart and labs.  Questions were answered to the patient's satisfaction.     Jerrianne Hartin L

## 2015-06-27 NOTE — Transfer of Care (Signed)
Last Vitals:  Filed Vitals:   06/27/15 1155  BP: 115/76  Pulse: 93  Temp: 36.4 C  Resp: 20    Immediate Anesthesia Transfer of Care Note  Patient: Edwin [name]  Procedure(s) Performed: Procedure(s) (LRB): TRANSURETHRAL RESECTION OF THE PROSTATE WITH GYRUS INSTRUMENTS (N/A)  Patient Location: PACU  Anesthesia Type: General  Level of Consciousness: drowsy  Airway & Oxygen Therapy: Patient Spontanous Breathing and Patient connected to face mask oxygen  Post-op Assessment: Report given to PACU RN and Post -op Vital signs reviewed and stable  Post vital signs: Reviewed and stable  Complications: No apparent anesthesia complications

## 2015-06-27 NOTE — Brief Op Note (Signed)
06/27/2015  2:10 PM  PATIENT:  Edwin [name]  59 y.o. male  PRE-OPERATIVE DIAGNOSIS:  BENIGN PROSTATIC HYPERPLASIA, RETENTION  POST-OPERATIVE DIAGNOSIS:  BENIGN PROSTATIC HYPERPLASIA, RETENTION  PROCEDURE:  Procedure(s): TRANSURETHRAL RESECTION OF THE PROSTATE WITH GYRUS INSTRUMENTS (N/A)  SURGEON:  Surgeon(s) and Role:    * Cleon Gustin, MD - Primary  PHYSICIAN ASSISTANT:   ASSISTANTS: none   ANESTHESIA:   general  EBL:  Total I/O In: 1000 [I.V.:1000] Out: 300 [Urine:300]  BLOOD ADMINISTERED:none  DRAINS: Urinary Catheter (Foley)   LOCAL MEDICATIONS USED:  NONE  SPECIMEN:  Source of Specimen:  prostate chips  DISPOSITION OF SPECIMEN:  PATHOLOGY  COUNTS:  YES  TOURNIQUET:  * No tourniquets in log *  DICTATION: .Note written in EPIC  PLAN OF CARE: Admit for overnight observation  PATIENT DISPOSITION:  PACU - hemodynamically stable.   Delay start of Pharmacological VTE agent (>24hrs) due to surgical blood loss or risk of bleeding: not applicable

## 2015-06-27 NOTE — Anesthesia Procedure Notes (Signed)
Procedure Name: LMA Insertion Date/Time: 06/27/2015 1:09 PM Performed by: Mechele Claude Pre-anesthesia Checklist: Patient identified, Emergency Drugs available, Suction available and Patient being monitored Patient Re-evaluated:Patient Re-evaluated prior to inductionOxygen Delivery Method: Circle System Utilized Preoxygenation: Pre-oxygenation with 100% oxygen Intubation Type: IV induction Ventilation: Mask ventilation without difficulty LMA: LMA inserted LMA Size: 4.0 Number of attempts: 1 Airway Equipment and Method: bite block Placement Confirmation: positive ETCO2 Tube secured with: Tape Dental Injury: Teeth and Oropharynx as per pre-operative assessment

## 2015-06-27 NOTE — H&P (View-Only) (Signed)
Urology Admission H&P  Chief Complaint: suprapubic pain  History of Present Illness: Mr Packman is a 59yo here today for TURP. He has a hx of ARF and had a foley placed several months ago. Creatinine came down to 1.6 prior to discharge in 03/2015. Creatinine today is 3.2. The patient has not been drinking and eating well for the past month. Pt is a poor historian  Past Medical History  Diagnosis Date  . Paranoid schizophrenia ([place])   . Anxiety   . Depression   . GERD (gastroesophageal reflux disease)     under control  . Foley catheter in place   . History of Clostridium difficile colitis     03-31-2015--  resolved  . Urinary retention   . CKD (chronic kidney disease) stage 3, GFR 30-59 ml/min   . PIN (prostatic intraepithelial neoplasia)     HIGH GRADE  . History of sepsis     secondary to UTI and ARF  x2 admission 03-31-2015 &  04-14-2015  . Hyperlipidemia    Past Surgical History  Procedure Laterality Date  . Cystoscopy N/A 03/08/2015    Procedure: CYSTOSCOPY;  Surgeon: Lowella Bandy, MD;  Location: [facility];  Service: Urology;  Laterality: N/A;  . Transurethral resection of bladder tumor N/A 03/08/2015    Procedure: TRANSURETHRAL RESECTION OF BLADDER TUMOR (TURBT);  Surgeon: Lowella Bandy, MD;  Location: [facility];  Service: Urology;  Laterality: N/A;  TUR BIOPSY BLADDER  . Prostate biopsy N/A 03/08/2015    Procedure: BIOPSY TRANSRECTAL ULTRASONIC PROSTATE (TUBP);  Surgeon: Lowella Bandy, MD;  Location: [facility];  Service: Urology;  Laterality: N/A;  . Cervical disc surgery  1990's    Home Medications:  Prescriptions prior to admission  Medication Sig Dispense Refill Last Dose  . acetaminophen (TYLENOL) 500 MG tablet Take 1,000 mg by mouth every 4 (four) hours as needed for moderate pain or headache.   06/09/2015 at Unknown time  . aspirin EC 325 MG tablet Take 325 mg by mouth every 6 (six) hours as needed.   Past Week at Unknown time  . benztropine (COGENTIN) 0.5 MG tablet Take 0.5 mg by mouth  every morning.  4 06/09/2015 at Unknown time  . cloZAPine (CLOZARIL) 100 MG tablet Take 800 mg by mouth at bedtime.   06/09/2015 at Unknown time  . simvastatin (ZOCOR) 20 MG tablet Take 20 mg by mouth daily.  3 06/09/2015 at Unknown time   Allergies: No Known Allergies  History reviewed. No pertinent family history. Social History:  reports that he has been smoking Cigarettes.  He has a 40 pack-year smoking history. He has never used smokeless tobacco. He reports that he does not drink alcohol or use illicit drugs.  Review of Systems  All other systems reviewed and are negative.   Physical Exam:  Vital signs in last 24 hours: Temp:  [97.3 F (36.3 C)] 97.3 F (36.3 C) (11/11 0929) Pulse Rate:  [117] 117 (11/11 0929) Resp:  [14] 14 (11/11 0929) BP: (97)/(60) 97/60 mmHg (11/11 0929) SpO2:  [100 %] 100 % (11/11 0929) Weight:  [69.627 kg (153 lb 8 oz)] 69.627 kg (153 lb 8 oz) (11/11 [id number]) Physical Exam  Constitutional: He is oriented to person, place, and time. He appears well-developed.  HENT:  Head: Normocephalic and atraumatic.  Eyes: EOM are normal. Pupils are equal, round, and reactive to light.  Neck: Normal range of motion. No thyromegaly present.  Cardiovascular: Normal rate and regular rhythm.   Respiratory: Effort normal. No  respiratory distress.  GI: Soft. He exhibits no distension.  Musculoskeletal: Normal range of motion.  Neurological: He is alert and oriented to person, place, and time.  Skin: Skin is warm and dry.  Psychiatric: He has a normal mood and affect. His behavior is normal. Judgment and thought content normal.    Laboratory Data:  Results for orders placed or performed during the hospital encounter of 06/10/15 (from the past 24 hour(s))  I-STAT, chem 8     Status: Abnormal   Collection Time: 06/10/15 10:04 AM  Result Value Ref Range   Sodium 134 (L) 135 - 145 mmol/L   Potassium 3.1 (L) 3.5 - 5.1 mmol/L   Chloride 100 (L) 101 - 111 mmol/L   BUN 26  (H) 6 - 20 mg/dL   Creatinine, Ser 3.20 (H) 0.61 - 1.24 mg/dL   Glucose, Bld 152 (H) 65 - 99 mg/dL   Calcium, Ion 1.16 1.12 - 1.23 mmol/L   TCO2 19 0 - 100 mmol/L   Hemoglobin 12.2 (L) 13.0 - 17.0 g/dL   HCT 36.0 (L) 39.0 - 52.0 %   No results found for this or any previous visit (from the past 240 hour(s)). Creatinine:  Recent Labs  06/10/15 1004  CREATININE 3.20*   Baseline Creatinine: 1.6  Impression/Assessment:  59yo with BPH LUTS, retention,. ARF  Plan:  The risks/benefits/alternatives to cysto, TURP, bilateral retrograde, possible bilateral stent placement was explained to the patient and brother and they understand and wish to proceed with surgery  Ashaki Frosch L 06/10/2015, 10:40 AM

## 2015-06-27 NOTE — Op Note (Signed)
Preoperative diagnosis: BPH with LUTS, urinary retention  Postoperative diagnosis: same  Procedure: 1 cystoscopy 2. Transurethral resection of the prostate  Attending: Nicolette Bang  Anesthesia: General  Estimated blood loss: Minimal  Drains: 22 French foley  Specimens: 1. Prostate Chips  Antibiotics: Zosyn  Findings: Trilobar prostate enlargement. Ureteral orifices in normal anatomic location.   Indications: Patient is a 59 year old male with a history of BPH and urinary retention.  After discussing treatment options, they decided proceed with transurethral resection of the prostate.  Procedure her in detail: The patient was brought to the operating room and a brief timeout was done to ensure correct patient, correct procedure, correct site.  General anesthesia was administered patient was placed in dorsal lithotomy position.  Their genitalia was then prepped and draped in usual sterile fashion.  A rigid 26 French cystoscope was passed in the urethra and the bladder.  Bladder was inspected and we noted no masses or lesions.  the ureteral orifices were in the normal orthotopic locations. removed the cystoscope and placed a resectoscope into the bladder. We then turned our attention to the prostate resection. Using the bipolar resectoscope we resected the median lobe first from the bladder neck to the verumontanum. We then started at the 12 oclock position on the left lobe and resection to the 6 o'clock position from the bladder neck to the verumontanum. We then did the same resection of the right lobe. Once the resection was complete we then cauterized individual bleeders. We then removed the prostate chips and sent them for pathology.  We then re-inspected the prostatic fossa and found no residual bleeding.  the bladder was then drained, a 22 French foley was placed and this concluded the procedure which was well tolerated by patient.  Complications: None  Condition: Stable, extubated,  transferred to PACU  Plan: Patient is admitted overnight with continuous bladder irrigation. If their urine is clear tomorrow they will be discharged home and followup in 5 days for foley catheter removal and pathology discussion.

## 2015-06-27 NOTE — Anesthesia Preprocedure Evaluation (Signed)
Anesthesia Evaluation  Patient identified by MRN, date of birth, ID band Patient awake    Reviewed: Allergy & Precautions, NPO status , Patient's Chart, lab work & pertinent test results  Airway Mallampati: II  TM Distance: >3 FB Neck ROM: Full    Dental no notable dental hx.    Pulmonary Current Smoker,    Pulmonary exam normal breath sounds clear to auscultation       Cardiovascular negative cardio ROS Normal cardiovascular exam Rhythm:Regular Rate:Normal     Neuro/Psych PSYCHIATRIC DISORDERS Anxiety Depression Schizophrenia  Neuromuscular disease    GI/Hepatic Neg liver ROS, GERD  ,  Endo/Other  negative endocrine ROS  Renal/GU Renal InsufficiencyRenal disease  negative genitourinary   Musculoskeletal negative musculoskeletal ROS (+)   Abdominal   Peds negative pediatric ROS (+)  Hematology negative hematology ROS (+)   Anesthesia Other Findings   Reproductive/Obstetrics negative OB ROS                             Anesthesia Physical Anesthesia Plan  ASA: III  Anesthesia Plan: General   Post-op Pain Management:    Induction: Intravenous  Airway Management Planned:   Additional Equipment:   Intra-op Plan:   Post-operative Plan: Extubation in OR  Informed Consent: I have reviewed the patients History and Physical, chart, labs and discussed the procedure including the risks, benefits and alternatives for the proposed anesthesia with the patient or authorized representative who has indicated his/her understanding and acceptance.   Dental advisory given  Plan Discussed with: CRNA  Anesthesia Plan Comments:         Anesthesia Quick Evaluation

## 2015-06-27 NOTE — Anesthesia Postprocedure Evaluation (Signed)
Anesthesia Post Note  Patient: Edwin [name]  Procedure(s) Performed: Procedure(s) (LRB): TRANSURETHRAL RESECTION OF THE PROSTATE WITH GYRUS INSTRUMENTS (N/A)  Patient location during evaluation: PACU Anesthesia Type: General Level of consciousness: awake and alert Pain management: pain level controlled Vital Signs Assessment: post-procedure vital signs reviewed and stable Respiratory status: spontaneous breathing, nonlabored ventilation, respiratory function stable and patient connected to nasal cannula oxygen Cardiovascular status: blood pressure returned to baseline and stable Postop Assessment: No signs of nausea or vomiting Anesthetic complications: no    Last Vitals:  Filed Vitals:   06/27/15 1155 06/27/15 1423  BP: 115/76 109/71  Pulse: 93   Temp: 36.4 C 36.4 C  Resp: 20 14    Last Pain:  Filed Vitals:   06/27/15 1437  PainSc: Asleep                 Zak Gondek J

## 2015-06-28 ENCOUNTER — Encounter (HOSPITAL_BASED_OUTPATIENT_CLINIC_OR_DEPARTMENT_OTHER): Payer: Self-pay | Admitting: Urology

## 2015-06-28 DIAGNOSIS — N401 Enlarged prostate with lower urinary tract symptoms: Secondary | ICD-10-CM | POA: Diagnosis not present

## 2015-06-28 LAB — BASIC METABOLIC PANEL
Anion gap: 6 (ref 5–15)
BUN: 21 mg/dL — AB (ref 6–20)
CHLORIDE: 110 mmol/L (ref 101–111)
CO2: 25 mmol/L (ref 22–32)
CREATININE: 2.03 mg/dL — AB (ref 0.61–1.24)
Calcium: 8.5 mg/dL — ABNORMAL LOW (ref 8.9–10.3)
GFR calc Af Amer: 40 mL/min — ABNORMAL LOW (ref >60)
GFR calc non Af Amer: 35 mL/min — ABNORMAL LOW (ref >60)
Glucose, Bld: 122 mg/dL — ABNORMAL HIGH (ref 65–99)
Potassium: 4.1 mmol/L (ref 3.5–5.1)
Sodium: 141 mmol/L (ref 135–145)

## 2015-06-28 LAB — CBC WITH DIFFERENTIAL/PLATELET
Basophils Absolute: 0 10*3/uL (ref 0.0–0.1)
Basophils Relative: 0 %
EOS ABS: 0 10*3/uL (ref 0.0–0.7)
EOS PCT: 0 %
HCT: 30.9 % — ABNORMAL LOW (ref 39.0–52.0)
Hemoglobin: 10 g/dL — ABNORMAL LOW (ref 13.0–17.0)
LYMPHS ABS: 0.7 10*3/uL (ref 0.7–4.0)
Lymphocytes Relative: 4 %
MCH: 26.7 pg (ref 26.0–34.0)
MCHC: 32.4 g/dL (ref 30.0–36.0)
MCV: 82.4 fL (ref 78.0–100.0)
MONO ABS: 0.2 10*3/uL (ref 0.1–1.0)
MONOS PCT: 1 %
Neutro Abs: 15.6 10*3/uL — ABNORMAL HIGH (ref 1.7–7.7)
Neutrophils Relative %: 95 %
PLATELETS: 409 10*3/uL — AB (ref 150–400)
RBC: 3.75 MIL/uL — ABNORMAL LOW (ref 4.22–5.81)
RDW: 20.4 % — AB (ref 11.5–15.5)
WBC: 16.5 10*3/uL — ABNORMAL HIGH (ref 4.0–10.5)

## 2015-06-28 MED ORDER — OXYCODONE-ACETAMINOPHEN 5-325 MG PO TABS: 1.0000 | ORAL_TABLET | ORAL | Status: DC | PRN

## 2015-06-28 NOTE — Discharge Instructions (Signed)
Transurethral Resection of the Prostate Transurethral resection of the prostate (TURP) is the removal of part of your prostate to treat noncancerous (benign) prostatic hyperplasia (BPH). BPH typically occurs in men older than 40 years. It is the abnormal growth of cells in your prostate. Specifically, it is an abnormal increase in the number of cells that make up your prostate tissue. This causes an increase in the size of your prostate. Often, in the case of BPH, the prostate becomes so large that it compresses the tube that drains urine out of your body from your bladder (urethra). Eventually, this compression can obstruct the flow of urine from your bladder. This obstruction can cause recurrent bladder infection and difficulties with bladder control and bladder emptying. The goal of TURP is to remove enough prostate tissue to allow for an unobstructed flow of urine, which often resolves the associated conditions. LET YOUR CAREGIVER KNOW ABOUT:  Any allergies you have.  Any medicines you are taking, including herbs, eye drops, over-the-counter medicines, and creams.  Any problems you have had with the use of anesthetics.  Any blood disorders you have, including bleeding problems and clotting problems.  Previous surgeries you have had.  Any prostate infections you have had. RISKS AND COMPLICATIONS Generally, TURP is a safe procedure. However, as with any surgical procedure, complications can occur. Possible complications associated with TURP include:  Difficulty getting an erection.  Scarring, which may cause problems with the flow of your urine.  Injury to your urethra.  Incontinence from injury to the muscle that surrounds your prostate, which controls urine flow.  Infection.  Bleeding.  Injury to your bladder (rare). BEFORE THE PROCEDURE  Your caregiver will tell you when you need to stop eating and drinking. If you take any medicines, your caregiver will tell you which ones you  may keep taking and which ones you will have to stop taking and when.  Just before the procedure you will also receive medicine to make you fall asleep (general anesthetic). This will be given through a tube that is inserted into one of your veins (intravenous [IV] tube). PROCEDURE Your surgeon inserts an instrument that is similar to a telescope with an electric cutting edge (resectoscope) through your urethra to the area of the prostate gland. The cutting edge is used to remove enlarged pieces of your prostate, one piece at a time. At the end of your procedure, a flexible tube (catheter) will be inserted into your urethra to drain your bladder. Special plastic bags filled with solution will be connected to the end of the catheter. The solution will be used to irrigate blood from your bladder while you heal.  AFTER THE PROCEDURE You will be taken to the recovery area. Once you are awake, stable, and taking fluids well, you will be taken to your hospital room. Typically, you will stay in the hospital 1-2 days after this procedure. The catheter usually is removed before discharge from the hospital.   This information is not intended to replace advice given to you by your health care provider. Make sure you discuss any questions you have with your health care provider.   Document Released: 07/16/2005 Document Revised: 08/06/2014 Document Reviewed: 12/17/2011 Elsevier Interactive Patient Education [employer].

## 2015-06-28 NOTE — Progress Notes (Signed)
Plan for pt is to go home today, where he lives alone, with Foley for 5 days until recheck in MD office. Pt was awake most of night pulling at foley and CBI and IV tubings.  Seems incoherent at times. Questioning if pt able to care for self post op.

## 2015-06-28 NOTE — Progress Notes (Signed)
Nutrition Brief Note  Patient identified on the Malnutrition Screening Tool (MST) Report  Wt Readings from Last 15 Encounters:  06/27/15 170 lb (77.111 kg)  06/17/15 153 lb (69.4 kg)  04/16/15 164 lb (74.39 kg)  04/07/15 177 lb 4 oz (80.4 kg)  03/08/15 176 lb (79.833 kg)  03/07/15 176 lb (79.833 kg)  01/19/15 192 lb 3.9 oz (87.2 kg)    Body mass index is 23.72 kg/(m^2). Patient meets criteria for normal weight based on current BMI.   Current diet order is Regular, patient is consuming approximately 100% of meals at this time. Labs and medications reviewed.   Per chart review, pt to d/c today. Weight has fluctuated over the past 5 months per review of weight hx listed above.  No nutrition interventions warranted at this time. If nutrition issues arise, please consult RD.      Jarome Matin, RD, LDN Inpatient Clinical Dietitian Pager # [phone] After hours/weekend pager # [phone]

## 2015-06-28 NOTE — Progress Notes (Signed)
DC instruction reviewed, patients brother was at bedside. Questions concerns denied r/t teaching/ instruction

## 2015-07-08 NOTE — Discharge Summary (Signed)
Physician Discharge Summary  Patient ID: Edwin [name] MRN: [id number] DOB/AGE: 57-Nov-1957 57 y.o.  Admit date: 06/27/2015 Discharge date: 06/28/2015  Admission Diagnoses: BPH  Discharge Diagnoses:  Active Problems:   BPH (benign prostatic hyperplasia)   Discharged Condition: good  Hospital Course: The patient tolerated the procedure well and was transferred to the floor on IV pain meds, IV fluid. On POD#1pt was started on regular diet and they ambulated in the halls. Prior to discharge the pt was tolerating a regular diet, pain was controlled on PO pain meds, they were ambulating without difficulty, and they had normal bowel function.   Consults: None  Significant Diagnostic Studies: none  Treatments: surgery: TURP  Discharge Exam: Blood pressure 104/70, pulse 103, temperature 98.4 F (36.9 C), temperature source Oral, resp. rate 24, height 5\' 11"  (1.803 m), weight 77.111 kg (170 lb), SpO2 95 %. General appearance: alert, cooperative and appears stated age Head: Normocephalic, without obvious abnormality, atraumatic Eyes: conjunctivae/corneas clear. PERRL, EOM's intact. Fundi benign. Neck: no adenopathy, no carotid bruit, no JVD, supple, symmetrical, trachea midline and thyroid not enlarged, symmetric, no tenderness/mass/nodules Resp: clear to auscultation bilaterally GI: soft, non-tender; bowel sounds normal; no masses,  no organomegaly Male genitalia: normal Extremities: extremities normal, atraumatic, no cyanosis or edema  Disposition: 01-Home or Self Care     Medication List    TAKE these medications        acetaminophen 500 MG tablet  Commonly known as:  TYLENOL  Take 1,000 mg by mouth every 4 (four) hours as needed for moderate pain or headache.     benztropine 0.5 MG tablet  Commonly known as:  COGENTIN  Take 0.5 mg by mouth every morning.     ciprofloxacin 500 MG tablet  Commonly known as:  CIPRO  Take 1 tablet (500 mg total) by mouth 2 (two) times  daily.     cloZAPine 100 MG tablet  Commonly known as:  CLOZARIL  Take 800 mg by mouth at bedtime.     oxyCODONE-acetaminophen 5-325 MG tablet  Commonly known as:  PERCOCET/ROXICET  Take 1-2 tablets by mouth every 4 (four) hours as needed for moderate pain.     simvastatin 20 MG tablet  Commonly known as:  ZOCOR  Take 20 mg by mouth daily.     tamsulosin 0.4 MG Caps capsule  Commonly known as:  FLOMAX  Take 0.4 mg by mouth daily.           Follow-up Information    Follow up with MCKENZIE, PATRICK L, MD. Call in 1 week.   Specialty:  Urology   Contact information:   [address] [place] [address] [phone]       Signed: Cleon Gustin 07/08/2015, 5:55 PM

## 2016-06-15 ENCOUNTER — Other Ambulatory Visit: Payer: Self-pay | Admitting: Urology

## 2016-07-05 ENCOUNTER — Encounter (HOSPITAL_BASED_OUTPATIENT_CLINIC_OR_DEPARTMENT_OTHER): Payer: Self-pay | Admitting: *Deleted

## 2016-07-06 ENCOUNTER — Encounter (HOSPITAL_BASED_OUTPATIENT_CLINIC_OR_DEPARTMENT_OTHER): Payer: Self-pay | Admitting: *Deleted

## 2016-07-06 NOTE — Progress Notes (Signed)
SPOKE [place].  PT HAS PARANOID SCHIZOPHENIA.  NPO AFTER MN.  ARRIVE AT 1000.  NEEDS HG. WILL TAKE AM MEDS W/ SIPS OF WATER. BROTHER TO BRING MEDICATION LIST, STATES ONE ADDED NEW MEDICATION THAT HE DID NOT REMEMBER.

## 2016-07-09 ENCOUNTER — Ambulatory Visit (HOSPITAL_BASED_OUTPATIENT_CLINIC_OR_DEPARTMENT_OTHER)
Admission: RE | Admit: 2016-07-09 | Discharge: 2016-07-10 | Disposition: A | Discharge status: Home / Self Care | Payer: Medicare Other | Source: Ambulatory Visit | Attending: Urology | Admitting: Urology

## 2016-07-09 ENCOUNTER — Ambulatory Visit (HOSPITAL_BASED_OUTPATIENT_CLINIC_OR_DEPARTMENT_OTHER): Payer: Medicare Other | Admitting: Anesthesiology

## 2016-07-09 ENCOUNTER — Encounter (HOSPITAL_BASED_OUTPATIENT_CLINIC_OR_DEPARTMENT_OTHER): Payer: Self-pay | Admitting: Certified Registered"

## 2016-07-09 ENCOUNTER — Encounter (HOSPITAL_COMMUNITY)
Admission: RE | Disposition: A | Discharge status: Home / Self Care | Payer: Self-pay | Source: Ambulatory Visit | Attending: Urology

## 2016-07-09 DIAGNOSIS — R338 Other retention of urine: Secondary | ICD-10-CM | POA: Insufficient documentation

## 2016-07-09 DIAGNOSIS — N400 Enlarged prostate without lower urinary tract symptoms: Secondary | ICD-10-CM | POA: Diagnosis present

## 2016-07-09 DIAGNOSIS — Z79899 Other long term (current) drug therapy: Secondary | ICD-10-CM | POA: Diagnosis not present

## 2016-07-09 DIAGNOSIS — F418 Other specified anxiety disorders: Secondary | ICD-10-CM | POA: Diagnosis not present

## 2016-07-09 DIAGNOSIS — K219 Gastro-esophageal reflux disease without esophagitis: Secondary | ICD-10-CM | POA: Insufficient documentation

## 2016-07-09 DIAGNOSIS — Z8619 Personal history of other infectious and parasitic diseases: Secondary | ICD-10-CM | POA: Insufficient documentation

## 2016-07-09 DIAGNOSIS — F329 Major depressive disorder, single episode, unspecified: Secondary | ICD-10-CM | POA: Insufficient documentation

## 2016-07-09 DIAGNOSIS — F17210 Nicotine dependence, cigarettes, uncomplicated: Secondary | ICD-10-CM | POA: Insufficient documentation

## 2016-07-09 DIAGNOSIS — N401 Enlarged prostate with lower urinary tract symptoms: Secondary | ICD-10-CM | POA: Insufficient documentation

## 2016-07-09 DIAGNOSIS — N183 Chronic kidney disease, stage 3 (moderate): Secondary | ICD-10-CM | POA: Insufficient documentation

## 2016-07-09 DIAGNOSIS — F200 Paranoid schizophrenia: Secondary | ICD-10-CM | POA: Diagnosis not present

## 2016-07-09 DIAGNOSIS — E785 Hyperlipidemia, unspecified: Secondary | ICD-10-CM | POA: Diagnosis not present

## 2016-07-09 HISTORY — DX: Unspecified urinary incontinence: R32

## 2016-07-09 HISTORY — PX: TRANSURETHRAL RESECTION OF PROSTATE: SHX73

## 2016-07-09 LAB — BASIC METABOLIC PANEL WITH GFR
Anion gap: 6 (ref 5–15)
BUN: 10 mg/dL (ref 6–20)
CO2: 28 mmol/L (ref 22–32)
Calcium: 8.5 mg/dL — ABNORMAL LOW (ref 8.9–10.3)
Chloride: 105 mmol/L (ref 101–111)
Creatinine, Ser: 1.52 mg/dL — ABNORMAL HIGH (ref 0.61–1.24)
GFR calc Af Amer: 57 mL/min — ABNORMAL LOW (ref >60)
GFR calc non Af Amer: 49 mL/min — ABNORMAL LOW (ref >60)
Glucose, Bld: 107 mg/dL — ABNORMAL HIGH (ref 65–99)
Potassium: 3.7 mmol/L (ref 3.5–5.1)
Sodium: 139 mmol/L (ref 135–145)

## 2016-07-09 LAB — DIFFERENTIAL
BASOS PCT: 0 %
Basophils Absolute: 0 10*3/uL (ref 0.0–0.1)
EOS ABS: 0.1 10*3/uL (ref 0.0–0.7)
EOS PCT: 1 %
LYMPHS ABS: 0.7 10*3/uL (ref 0.7–4.0)
Lymphocytes Relative: 6 %
Monocytes Absolute: 0.2 10*3/uL (ref 0.1–1.0)
Monocytes Relative: 2 %
NEUTROS PCT: 91 %
Neutro Abs: 11.5 10*3/uL — ABNORMAL HIGH (ref 1.7–7.7)

## 2016-07-09 LAB — CBC
HEMATOCRIT: 46.4 % (ref 39.0–52.0)
Hemoglobin: 15.4 g/dL (ref 13.0–17.0)
MCH: 28.1 pg (ref 26.0–34.0)
MCHC: 33.2 g/dL (ref 30.0–36.0)
MCV: 84.7 fL (ref 78.0–100.0)
PLATELETS: 269 10*3/uL (ref 150–400)
RBC: 5.48 MIL/uL (ref 4.22–5.81)
RDW: 15.8 % — ABNORMAL HIGH (ref 11.5–15.5)
WBC: 13 10*3/uL — AB (ref 4.0–10.5)

## 2016-07-09 LAB — POCT HEMOGLOBIN-HEMACUE: HEMOGLOBIN: 18.1 g/dL — AB (ref 13.0–17.0)

## 2016-07-09 SURGERY — TURP (TRANSURETHRAL RESECTION OF PROSTATE)
Anesthesia: General | Site: Prostate

## 2016-07-09 MED ORDER — MIDAZOLAM HCL 2 MG/2ML IJ SOLN
INTRAMUSCULAR | Status: AC
  Filled 2016-07-09: qty 2

## 2016-07-09 MED ORDER — PROPOFOL 10 MG/ML IV BOLUS
INTRAVENOUS | Status: AC
  Filled 2016-07-09: qty 20

## 2016-07-09 MED ORDER — PHENYLEPHRINE 40 MCG/ML (10ML) SYRINGE FOR IV PUSH (FOR BLOOD PRESSURE SUPPORT)
PREFILLED_SYRINGE | INTRAVENOUS | Status: AC
  Filled 2016-07-09: qty 10

## 2016-07-09 MED ORDER — PROPOFOL 10 MG/ML IV BOLUS
INTRAVENOUS | Status: DC | PRN
  Administered 2016-07-09: 200 mg via INTRAVENOUS

## 2016-07-09 MED ORDER — ONDANSETRON HCL 4 MG/2ML IJ SOLN
INTRAMUSCULAR | Status: DC | PRN
  Administered 2016-07-09: 4 mg via INTRAVENOUS

## 2016-07-09 MED ORDER — SODIUM CHLORIDE 0.9 % IV SOLN
INTRAVENOUS | Status: DC
  Administered 2016-07-10: 02:00:00 via INTRAVENOUS

## 2016-07-09 MED ORDER — DIPHENHYDRAMINE HCL 12.5 MG/5ML PO ELIX: 12.5000 mg | ORAL_SOLUTION | Freq: Four times a day (QID) | ORAL | Status: DC | PRN

## 2016-07-09 MED ORDER — MEPERIDINE HCL 25 MG/ML IJ SOLN
6.2500 mg | INTRAMUSCULAR | Status: DC | PRN
  Filled 2016-07-09: qty 1

## 2016-07-09 MED ORDER — OXYCODONE HCL 5 MG PO TABS
5.0000 mg | ORAL_TABLET | Freq: Once | ORAL | Status: DC | PRN
  Filled 2016-07-09: qty 1

## 2016-07-09 MED ORDER — CEFTRIAXONE SODIUM 2 G IJ SOLR
INTRAMUSCULAR | Status: AC
  Filled 2016-07-09: qty 2

## 2016-07-09 MED ORDER — CLOZAPINE 100 MG PO TABS: 800.0000 mg | ORAL_TABLET | Freq: Every day | ORAL | Status: DC

## 2016-07-09 MED ORDER — DEXTROSE 5 % IV SOLN
2.0000 g | INTRAVENOUS | Status: AC
  Administered 2016-07-09: 2 g via INTRAVENOUS
  Filled 2016-07-09: qty 2

## 2016-07-09 MED ORDER — FINASTERIDE 5 MG PO TABS
5.0000 mg | ORAL_TABLET | Freq: Every day | ORAL | Status: DC
  Administered 2016-07-09 – 2016-07-10 (×2): 5 mg via ORAL
  Filled 2016-07-09 (×2): qty 1

## 2016-07-09 MED ORDER — DIPHENHYDRAMINE HCL 50 MG/ML IJ SOLN: 12.5000 mg | Freq: Four times a day (QID) | INTRAMUSCULAR | Status: DC | PRN

## 2016-07-09 MED ORDER — PROMETHAZINE HCL 25 MG/ML IJ SOLN
6.2500 mg | INTRAMUSCULAR | Status: DC | PRN
  Filled 2016-07-09: qty 1

## 2016-07-09 MED ORDER — HYDROMORPHONE HCL 1 MG/ML IJ SOLN
0.2500 mg | INTRAMUSCULAR | Status: DC | PRN
  Administered 2016-07-09: 0.5 mg via INTRAVENOUS
  Filled 2016-07-09: qty 0.5

## 2016-07-09 MED ORDER — SIMVASTATIN 20 MG PO TABS
20.0000 mg | ORAL_TABLET | Freq: Every day | ORAL | Status: DC
  Administered 2016-07-09 – 2016-07-10 (×2): 20 mg via ORAL
  Filled 2016-07-09 (×2): qty 1

## 2016-07-09 MED ORDER — HYDROMORPHONE HCL 1 MG/ML IJ SOLN: 0.5000 mg | INTRAMUSCULAR | Status: DC | PRN

## 2016-07-09 MED ORDER — ONDANSETRON HCL 4 MG/2ML IJ SOLN
INTRAMUSCULAR | Status: AC
  Filled 2016-07-09: qty 2

## 2016-07-09 MED ORDER — FENTANYL CITRATE (PF) 100 MCG/2ML IJ SOLN
INTRAMUSCULAR | Status: DC | PRN
  Administered 2016-07-09 (×2): 50 ug via INTRAVENOUS

## 2016-07-09 MED ORDER — CLOZAPINE 100 MG PO TABS
300.0000 mg | ORAL_TABLET | Freq: Every day | ORAL | Status: DC
  Administered 2016-07-09: 300 mg via ORAL
  Filled 2016-07-09: qty 3

## 2016-07-09 MED ORDER — ONDANSETRON HCL 4 MG/2ML IJ SOLN: 4.0000 mg | INTRAMUSCULAR | Status: DC | PRN

## 2016-07-09 MED ORDER — OXYCODONE-ACETAMINOPHEN 5-325 MG PO TABS: 1.0000 | ORAL_TABLET | ORAL | Status: DC | PRN

## 2016-07-09 MED ORDER — CARIPRAZINE HCL 1.5 MG PO CAPS
3.0000 mg | ORAL_CAPSULE | Freq: Every day | ORAL | Status: DC
  Administered 2016-07-10: 3 mg via ORAL
  Filled 2016-07-09: qty 2

## 2016-07-09 MED ORDER — HYDROMORPHONE HCL 1 MG/ML IJ SOLN
INTRAMUSCULAR | Status: AC
  Filled 2016-07-09: qty 1

## 2016-07-09 MED ORDER — LIDOCAINE 2% (20 MG/ML) 5 ML SYRINGE
INTRAMUSCULAR | Status: AC
  Filled 2016-07-09: qty 5

## 2016-07-09 MED ORDER — DEXTROSE 5 % IV SOLN
INTRAVENOUS | Status: AC
  Filled 2016-07-09: qty 50

## 2016-07-09 MED ORDER — BELLADONNA ALKALOIDS-OPIUM 16.2-60 MG RE SUPP: 1.0000 | Freq: Four times a day (QID) | RECTAL | Status: DC | PRN

## 2016-07-09 MED ORDER — STERILE WATER FOR IRRIGATION IR SOLN
Status: DC | PRN
  Administered 2016-07-09: 500 mL

## 2016-07-09 MED ORDER — LACTATED RINGERS IV SOLN
INTRAVENOUS | Status: DC
  Administered 2016-07-09 (×2): via INTRAVENOUS
  Filled 2016-07-09: qty 1000

## 2016-07-09 MED ORDER — DEXAMETHASONE SODIUM PHOSPHATE 4 MG/ML IJ SOLN
INTRAMUSCULAR | Status: DC | PRN
  Administered 2016-07-09: 10 mg via INTRAVENOUS

## 2016-07-09 MED ORDER — DEXAMETHASONE SODIUM PHOSPHATE 10 MG/ML IJ SOLN
INTRAMUSCULAR | Status: AC
  Filled 2016-07-09: qty 1

## 2016-07-09 MED ORDER — FENTANYL CITRATE (PF) 100 MCG/2ML IJ SOLN
INTRAMUSCULAR | Status: AC
  Filled 2016-07-09: qty 2

## 2016-07-09 MED ORDER — ACETAMINOPHEN 325 MG PO TABS: 650.0000 mg | ORAL_TABLET | ORAL | Status: DC | PRN

## 2016-07-09 MED ORDER — OXYCODONE HCL 5 MG/5ML PO SOLN
5.0000 mg | Freq: Once | ORAL | Status: DC | PRN
  Filled 2016-07-09: qty 5

## 2016-07-09 MED ORDER — BENZTROPINE MESYLATE 0.5 MG PO TABS
0.5000 mg | ORAL_TABLET | Freq: Every morning | ORAL | Status: DC
  Administered 2016-07-10: 0.5 mg via ORAL
  Filled 2016-07-09: qty 1

## 2016-07-09 MED ORDER — LIDOCAINE 2% (20 MG/ML) 5 ML SYRINGE
INTRAMUSCULAR | Status: DC | PRN
  Administered 2016-07-09: 100 mg via INTRAVENOUS

## 2016-07-09 MED ORDER — SODIUM CHLORIDE 0.9 % IR SOLN
Status: DC | PRN
  Administered 2016-07-09 (×2): 3000 mL
  Administered 2016-07-09: 6000 mL
  Administered 2016-07-09 (×3): 3000 mL

## 2016-07-09 MED ORDER — MIDAZOLAM HCL 5 MG/5ML IJ SOLN
INTRAMUSCULAR | Status: DC | PRN
  Administered 2016-07-09: 2 mg via INTRAVENOUS

## 2016-07-09 MED ORDER — ZOLPIDEM TARTRATE 5 MG PO TABS
5.0000 mg | ORAL_TABLET | Freq: Every evening | ORAL | Status: DC | PRN
  Administered 2016-07-10: 5 mg via ORAL
  Filled 2016-07-09: qty 1

## 2016-07-09 MED ORDER — PHENYLEPHRINE 40 MCG/ML (10ML) SYRINGE FOR IV PUSH (FOR BLOOD PRESSURE SUPPORT)
PREFILLED_SYRINGE | INTRAVENOUS | Status: DC | PRN
  Administered 2016-07-09 (×2): 40 ug via INTRAVENOUS
  Administered 2016-07-09: 80 ug via INTRAVENOUS
  Administered 2016-07-09: 120 ug via INTRAVENOUS
  Administered 2016-07-09: 40 ug via INTRAVENOUS
  Administered 2016-07-09: 80 ug via INTRAVENOUS
  Administered 2016-07-09: 40 ug via INTRAVENOUS
  Administered 2016-07-09: 80 ug via INTRAVENOUS
  Administered 2016-07-09: 40 ug via INTRAVENOUS

## 2016-07-09 MED ORDER — DEXTROSE 5 % IV SOLN
2.0000 g | INTRAVENOUS | Status: DC
  Administered 2016-07-10: 2 g via INTRAVENOUS
  Filled 2016-07-09: qty 2

## 2016-07-09 MED ORDER — CARIPRAZINE HCL 3 MG PO CAPS: 3.0000 mg | ORAL_CAPSULE | Freq: Every morning | ORAL | Status: DC

## 2016-07-09 SURGICAL SUPPLY — 32 items
BAG DRAIN URO-CYSTO SKYTR STRL (DRAIN) ×3 IMPLANT
BAG DRN UROCATH (DRAIN) ×1
BAG URINE DRAINAGE (UROLOGICAL SUPPLIES) ×2 IMPLANT
BAG URINE LEG 500ML (DRAIN) IMPLANT
CATH FOLEY 2WAY SLVR  5CC 22FR (CATHETERS)
CATH FOLEY 2WAY SLVR 30CC 20FR (CATHETERS) IMPLANT
CATH FOLEY 2WAY SLVR 5CC 22FR (CATHETERS) IMPLANT
CATH FOLEY 3WAY 30CC 22F (CATHETERS) ×5 IMPLANT
CLOTH BEACON ORANGE TIMEOUT ST (SAFETY) ×3 IMPLANT
ELECT REM PT RETURN 9FT ADLT (ELECTROSURGICAL) ×3
ELECTRODE REM PT RTRN 9FT ADLT (ELECTROSURGICAL) ×1 IMPLANT
EVACUATOR MICROVAS BLADDER (UROLOGICAL SUPPLIES) IMPLANT
GLOVE BIO SURGEON STRL SZ8 (GLOVE) ×3 IMPLANT
GLOVE ECLIPSE 6.0 STRL STRAW (GLOVE) ×2 IMPLANT
GLOVE ECLIPSE 6.5 STRL STRAW (GLOVE) ×4 IMPLANT
GLOVE INDICATOR 6.5 STRL GRN (GLOVE) ×2 IMPLANT
GOWN STRL REUS W/ TWL LRG LVL3 (GOWN DISPOSABLE) ×1 IMPLANT
GOWN STRL REUS W/TWL LRG LVL3 (GOWN DISPOSABLE) ×5 IMPLANT
GOWN STRL REUS W/TWL XL LVL3 (GOWN DISPOSABLE) ×2 IMPLANT
HOLDER FOLEY CATH W/STRAP (MISCELLANEOUS) ×2 IMPLANT
IV NS IRRIG 3000ML ARTHROMATIC (IV SOLUTION) ×14 IMPLANT
KIT ROOM TURNOVER WOR (KITS) ×3 IMPLANT
LOOP CUT BIPOLAR 24F LRG (ELECTROSURGICAL) ×2 IMPLANT
MANIFOLD NEPTUNE II (INSTRUMENTS) ×2 IMPLANT
PACK CYSTO (CUSTOM PROCEDURE TRAY) ×3 IMPLANT
PLUG CATH AND CAP STER (CATHETERS) ×3 IMPLANT
SET ASPIRATION TUBING (TUBING) IMPLANT
SYR 30ML LL (SYRINGE) ×2 IMPLANT
SYRINGE 20CC LL (MISCELLANEOUS) ×3 IMPLANT
SYRINGE IRR TOOMEY STRL 70CC (SYRINGE) IMPLANT
TUBE CONNECTING 12'X1/4 (SUCTIONS)
TUBE CONNECTING 12X1/4 (SUCTIONS) IMPLANT

## 2016-07-09 NOTE — Transfer of Care (Signed)
Immediate Anesthesia Transfer of Care Note  Patient: Edwin [name]  Procedure(s) Performed: Procedure(s): TRANSURETHRAL RESECTION OF THE PROSTATE (TURP) (N/A)  Patient Location: PACU  Anesthesia Type:General  Level of Consciousness: awake and oriented  Airway & Oxygen Therapy: Patient Spontanous Breathing and Patient connected to nasal cannula oxygen  Post-op Assessment: Report given to RN  Post vital signs: Reviewed and stable  Last Vitals:107/71,  85, 17, 99% Vitals:   07/09/16 0955  BP: 136/77  Pulse: (!) 107  Resp: 16  Temp: 36.4 C    Last Pain:  Vitals:   07/09/16 0955  TempSrc: Oral      Patients Stated Pain Goal: 5 ([id number])  Complications: No apparent anesthesia complications

## 2016-07-09 NOTE — H&P (Signed)
Urology Admission H&P  Chief Complaint: urinary retention  History of Present Illness: Edwin [name] is a 60yo with BPH with urinary retention.  Past Medical History:  Diagnosis Date  . Anxiety   . CKD (chronic kidney disease) stage 3, GFR 30-59 ml/min   . Depression   . GERD (gastroesophageal reflux disease)    under control  . History of Clostridium difficile colitis    03-31-2015--  resolved  . History of sepsis    secondary to UTI and ARF  x2 admission 03-31-2015 &  04-14-2015  . Hyperlipidemia   . Paranoid schizophrenia ([place])   . Urinary incontinence    Past Surgical History:  Procedure Laterality Date  . CERVICAL DISC SURGERY  1990's  . CYSTOSCOPY N/A 03/08/2015   Procedure: CYSTOSCOPY;  Surgeon: Lowella Bandy, MD;  Location: [facility];  Service: Urology;  Laterality: N/A;  . CYSTOSCOPY  06/10/2015   Procedure: CYSTOSCOPY;  Surgeon: Cleon Gustin, MD;  Location: [facility];  Service: Urology;;  . PROSTATE BIOPSY N/A 03/08/2015   Procedure: BIOPSY TRANSRECTAL ULTRASONIC PROSTATE (TUBP);  Surgeon: Lowella Bandy, MD;  Location: [facility];  Service: Urology;  Laterality: N/A;  . TRANSTHORACIC ECHOCARDIOGRAM  06-11-2015   grade 1 diastolic dysfunction, ef [id number];  trivial TR  . TRANSURETHRAL RESECTION OF BLADDER TUMOR N/A 03/08/2015   Procedure: TRANSURETHRAL RESECTION OF BLADDER TUMOR (TURBT);  Surgeon: Lowella Bandy, MD;  Location: [facility];  Service: Urology;  Laterality: N/A;  TUR BIOPSY BLADDER  . TRANSURETHRAL RESECTION OF PROSTATE N/A 06/27/2015   Procedure: TRANSURETHRAL RESECTION OF THE PROSTATE WITH GYRUS INSTRUMENTS;  Surgeon: Cleon Gustin, MD;  Location: [facility];  Service: Urology;  Laterality: N/A;    Home Medications:  Prescriptions Prior to Admission  Medication Sig Dispense Refill Last Dose  . acetaminophen (TYLENOL) 500 MG tablet Take 1,000 mg by mouth every 4 (four) hours as needed for moderate pain or headache.   07/08/2016 at Unknown time   . benztropine (COGENTIN) 0.5 MG tablet Take 0.5 mg by mouth every morning.  4 07/08/2016 at Unknown time  . Cariprazine HCl (VRAYLAR) 3 MG CAPS Take 3 mg by mouth.   07/08/2016 at Unknown time  . cloZAPine (CLOZARIL) 100 MG tablet Take 800 mg by mouth at bedtime.   07/08/2016 at Unknown time  . finasteride (PROSCAR) 5 MG tablet Take 5 mg by mouth daily.   07/08/2016 at Unknown time  . simvastatin (ZOCOR) 20 MG tablet Take 20 mg by mouth daily.  3 07/08/2016 at Unknown time  . tamsulosin (FLOMAX) 0.4 MG CAPS capsule Take 0.4 mg by mouth daily.  2 07/08/2016 at Unknown time   Allergies: No Known Allergies  History reviewed. No pertinent family history. Social History:  reports that he has been smoking Cigarettes.  He has a 40.00 pack-year smoking history. He has never used smokeless tobacco. He reports that he does not drink alcohol or use drugs.  Review of Systems  Genitourinary: Positive for frequency and urgency.  All other systems reviewed and are negative.   Physical Exam:  Vital signs in last 24 hours: Temp:  [97.6 F (36.4 C)] 97.6 F (36.4 C) (12/11 0955) Pulse Rate:  [107] 107 (12/11 0955) Resp:  [16] 16 (12/11 0955) BP: (136)/(77) 136/77 (12/11 0955) SpO2:  [99 %] 99 % (12/11 0955) Weight:  [83 kg (183 lb)] 83 kg (183 lb) (12/11 0955) Physical Exam  Constitutional: He is oriented to person, place, and time. He appears well-developed  and well-nourished.  HENT:  Head: Normocephalic and atraumatic.  Eyes: EOM are normal. Pupils are equal, round, and reactive to light.  Neck: Normal range of motion. No thyromegaly present.  Cardiovascular: Normal rate and regular rhythm.   Respiratory: Effort normal. No respiratory distress.  GI: Soft. He exhibits no distension.  Musculoskeletal: Normal range of motion. He exhibits no edema.  Neurological: He is alert and oriented to person, place, and time.  Skin: Skin is warm and dry.  Psychiatric: He has a normal mood and affect. His  behavior is normal. Judgment and thought content normal.    Laboratory Data:  Results for orders placed or performed during the hospital encounter of 07/09/16 (from the past 24 hour(s))  Hemoglobin-hemacue, POC     Status: Abnormal   Collection Time: 07/09/16 10:40 AM  Result Value Ref Range   Hemoglobin 18.1 (H) 13.0 - 17.0 g/dL   No results found for this or any previous visit (from the past 240 hour(s)). Creatinine: No results for input(s): CREATININE in the last 168 hours. Baseline Creatinine: unknwon  Impression/Assessment:  60yo with BPH with urinary retention  Plan:  The risks/benefits/alternatives to TURP was explained to the patient and brother and they understand and wish to proceed with surgery  Nicolette Bang 07/09/2016, 12:23 PM

## 2016-07-09 NOTE — Op Note (Signed)
Preoperative diagnosis: BPH with retention  Postoperative diagnosis: same  Procedure: 1 cystoscopy 2. Transurethral resection of the prostate  Attending: Nicolette Bang  Anesthesia: General  Estimated blood loss: Minimal  Drains: 22 French foley  Specimens: 1. Prostate Chips  Antibiotics: Rocephin  Findings: lateral lube fusion at apex. Ureteral orifices in normal anatomic location.   Indications: Patient is a 60 year old male with a history of BPH and elevated PVR.  After discussing treatment options, they decided proceed with transurethral resection of the prostate.  Procedure her in detail: The patient was brought to the operating room and a brief timeout was done to ensure correct patient, correct procedure, correct site.  General anesthesia was administered patient was placed in dorsal lithotomy position.  Their genitalia was then prepped and draped in usual sterile fashion.  A rigid 62 French cystoscope was passed in the urethra and the bladder.  Bladder was inspected and we noted no masses or lesions.  the ureteral orifices were in the normal orthotopic locations. removed the cystoscope and placed a resectoscope into the bladder. We then turned our attention to the prostate resection. Using the bipolar resectoscope we resected the median lobe first from the bladder neck to the verumontanum. We then started at the 12 oclock position on the left lobe and resection to the 6 o'clock position from the bladder neck to the verumontanum. We then did the same resection of the right lobe. Once the resection was complete we then cauterized individual bleeders. We then removed the prostate chips and sent them for pathology.  We then re-inspected the prostatic fossa and found no residual bleeding.  the bladder was then drained, a 22 French foley was placed and this concluded the procedure which was well tolerated by patient.  Complications: None  Condition: Stable, extubated, transferred to  PACU  Plan: Patient is admitted overnight with continuous bladder irrigation. If their urine is clear tomorrow they will be discharged home and followup in 5 days for foley catheter removal and pathology discussion.

## 2016-07-09 NOTE — Anesthesia Procedure Notes (Signed)
Procedure Name: LMA Insertion Date/Time: 07/09/2016 12:41 PM Performed by: Bethena Roys T Pre-anesthesia Checklist: Patient identified, Emergency Drugs available, Suction available and Patient being monitored Patient Re-evaluated:Patient Re-evaluated prior to inductionOxygen Delivery Method: Circle system utilized Preoxygenation: Pre-oxygenation with 100% oxygen Intubation Type: IV induction Ventilation: Mask ventilation without difficulty LMA: LMA inserted LMA Size: 5.0 Number of attempts: 1 Airway Equipment and Method: Bite block Placement Confirmation: positive ETCO2 Tube secured with: Tape Dental Injury: Teeth and Oropharynx as per pre-operative assessment  Comments: Lower teeth black and broken, upper teeth intact, gums red and swollen.  LMA place with extreme care to avoid teeth, bite block placed on R side of mouth

## 2016-07-09 NOTE — Anesthesia Preprocedure Evaluation (Signed)
Anesthesia Evaluation  Patient identified by MRN, date of birth, ID band Patient awake    Reviewed: Allergy & Precautions, NPO status , Patient's Chart, lab work & pertinent test results  Airway Mallampati: II  TM Distance: >3 FB Neck ROM: Full    Dental no notable dental hx.    Pulmonary Current Smoker,    Pulmonary exam normal breath sounds clear to auscultation       Cardiovascular negative cardio ROS Normal cardiovascular exam Rhythm:Regular Rate:Normal     Neuro/Psych PSYCHIATRIC DISORDERS Anxiety Depression Schizophrenia  Neuromuscular disease    GI/Hepatic Neg liver ROS, GERD  ,  Endo/Other  negative endocrine ROS  Renal/GU Renal InsufficiencyRenal disease     Musculoskeletal negative musculoskeletal ROS (+)   Abdominal   Peds  Hematology negative hematology ROS (+)   Anesthesia Other Findings   Reproductive/Obstetrics negative OB ROS                             Anesthesia Physical  Anesthesia Plan  ASA: III  Anesthesia Plan: General   Post-op Pain Management:    Induction: Intravenous  Airway Management Planned: LMA  Additional Equipment:   Intra-op Plan:   Post-operative Plan: Extubation in OR  Informed Consent: I have reviewed the patients History and Physical, chart, labs and discussed the procedure including the risks, benefits and alternatives for the proposed anesthesia with the patient or authorized representative who has indicated his/her understanding and acceptance.   Dental advisory given  Plan Discussed with: CRNA  Anesthesia Plan Comments:         Anesthesia Quick Evaluation

## 2016-07-09 NOTE — Anesthesia Postprocedure Evaluation (Signed)
Anesthesia Post Note  Patient: Edwin [name]  Procedure(s) Performed: Procedure(s) (LRB): TRANSURETHRAL RESECTION OF THE PROSTATE (TURP) (N/A)  Patient location during evaluation: PACU Anesthesia Type: General Level of consciousness: sedated and patient cooperative Pain management: pain level controlled Vital Signs Assessment: post-procedure vital signs reviewed and stable Respiratory status: spontaneous breathing Cardiovascular status: stable Anesthetic complications: no    Last Vitals:  Vitals:   07/09/16 1530 07/09/16 2135  BP: 123/79 112/67  Pulse: 82 97  Resp: 16 18  Temp: 36.7 C 36.6 C    Last Pain:  Vitals:   07/09/16 2135  TempSrc: Oral  PainSc:                  Nolon Nations

## 2016-07-10 ENCOUNTER — Encounter (HOSPITAL_BASED_OUTPATIENT_CLINIC_OR_DEPARTMENT_OTHER): Payer: Self-pay | Admitting: Urology

## 2016-07-10 DIAGNOSIS — N401 Enlarged prostate with lower urinary tract symptoms: Secondary | ICD-10-CM | POA: Diagnosis not present

## 2016-07-10 LAB — BASIC METABOLIC PANEL
ANION GAP: 7 (ref 5–15)
BUN: 17 mg/dL (ref 6–20)
CALCIUM: 8.2 mg/dL — AB (ref 8.9–10.3)
CO2: 25 mmol/L (ref 22–32)
Chloride: 108 mmol/L (ref 101–111)
Creatinine, Ser: 1.56 mg/dL — ABNORMAL HIGH (ref 0.61–1.24)
GFR, EST AFRICAN AMERICAN: 55 mL/min — AB (ref >60)
GFR, EST NON AFRICAN AMERICAN: 47 mL/min — AB (ref >60)
Glucose, Bld: 108 mg/dL — ABNORMAL HIGH (ref 65–99)
POTASSIUM: 3.9 mmol/L (ref 3.5–5.1)
Sodium: 140 mmol/L (ref 135–145)

## 2016-07-10 LAB — CBC
HCT: 46.2 % (ref 39.0–52.0)
HEMOGLOBIN: 15.2 g/dL (ref 13.0–17.0)
MCH: 27.9 pg (ref 26.0–34.0)
MCHC: 32.9 g/dL (ref 30.0–36.0)
MCV: 84.9 fL (ref 78.0–100.0)
PLATELETS: 271 10*3/uL (ref 150–400)
RBC: 5.44 MIL/uL (ref 4.22–5.81)
RDW: 16.1 % — AB (ref 11.5–15.5)
WBC: 20.4 10*3/uL — ABNORMAL HIGH (ref 4.0–10.5)

## 2016-07-10 MED ORDER — OXYCODONE-ACETAMINOPHEN 5-325 MG PO TABS: 1.0000 | ORAL_TABLET | ORAL | 0 refills | Status: DC | PRN

## 2016-07-10 NOTE — Care Management Note (Signed)
Case Management Note  Patient Details  Name: SOSAIA BEALES MRN: [id number] Date of Birth: 1956/05/06  Subjective/Objective:  60 y/o m admitted w/BPH. From home.                  Action/Plan:d/c home.   Expected Discharge Date:                  Expected Discharge Plan:  Home/Self Care  In-House Referral:     Discharge planning Services  CM Consult  Post Acute Care Choice:    Choice offered to:     DME Arranged:    DME Agency:     HH Arranged:    [place] Agency:     Status of Service:  Completed, signed off  If discussed at [employer] of Stay Meetings, dates discussed:    Additional Comments:  Dessa Phi, RN 07/10/2016, 10:15 AM

## 2016-07-10 NOTE — Progress Notes (Signed)
Completed D/C teaching. Demonstrated foley bag care. Gave prescription and D/C instructions. Answered all questions. Patient will be D/C home with family in stable condition.

## 2016-07-10 NOTE — Discharge Instructions (Signed)
Foley Catheter Care, Adult  A Foley catheter is a soft, flexible tube that is placed into the bladder to drain urine. A Foley catheter may be inserted if:  · You leak urine or are not able to control when you urinate (urinary incontinence).  · You are not able to urinate when you need to (urinary retention).  · You had prostate surgery or surgery on the genitals.  · You have certain medical conditions, such as multiple sclerosis, dementia, or a spinal cord injury.  If you are going home with a Foley catheter in place, follow the instructions below.  TAKING CARE OF THE CATHETER  1. Wash your hands with soap and water.  2. Using mild soap and warm water on a clean washcloth:  ¨ Clean the area on your body closest to the catheter insertion site using a circular motion, moving away from the catheter. Never wipe toward the catheter because this could sweep bacteria up into the urethra and cause infection.  ¨ Remove all traces of soap. Pat the area dry with a clean towel. For males, reposition the foreskin.  3. Attach the catheter to your leg so there is no tension on the catheter. Use adhesive tape or a leg strap. If you are using adhesive tape, remove any sticky residue left behind by the previous tape you used.  4. Keep the drainage bag below the level of the bladder, but keep it off the floor.  5. Check throughout the day to be sure the catheter is working and urine is draining freely. Make sure the tubing does not become kinked.  6. Do not pull on the catheter or try to remove it. Pulling could damage internal tissues.  TAKING CARE OF THE DRAINAGE BAGS  You will be given two drainage bags to take home. One is a large overnight drainage bag, and the other is a smaller leg bag that fits underneath clothing. You may wear the overnight bag at any time, but you should never wear the smaller leg bag at night. Follow the instructions below for how to empty, change, and clean your drainage bags.  Emptying the Drainage Bag   You must empty your drainage bag when it is ?-½ full or at least 2-3 times a day.  1. Wash your hands with soap and water.  2. Keep the drainage bag below your hips, below the level of your bladder. This stops urine from going back into the tubing and into your bladder.  3. Hold the dirty bag over the toilet or a clean container.  4. Open the pour spout at the bottom of the bag and empty the urine into the toilet or container. Do not let the pour spout touch the toilet, container, or any other surface. Doing so can place bacteria on the bag, which can cause an infection.  5. Clean the pour spout with a gauze pad or cotton ball that has rubbing alcohol on it.  6. Close the pour spout.  7. Attach the bag to your leg with adhesive tape or a leg strap.  8. Wash your hands well.  Changing the Drainage Bag  Change your drainage bag once a month or sooner if it starts to smell bad or look dirty. Below are steps to follow when changing the drainage bag.  1. Wash your hands with soap and water.  2. Pinch off the rubber catheter so that urine does not spill out.  3. Disconnect the catheter tube from the drainage tube   at the connection valve. Do not let the tubes touch any surface.  4. Clean the end of the catheter tube with an alcohol wipe. Use a different alcohol wipe to clean the end of the drainage tube.  5. Connect the catheter tube to the drainage tube of the clean drainage bag.  6. Attach the new bag to the leg with adhesive tape or a leg strap. Avoid attaching the new bag too tightly.  7. Wash your hands well.  Cleaning the Drainage Bag  1. Wash your hands with soap and water.  2. Wash the bag in warm, soapy water.  3. Rinse the bag thoroughly with warm water.  4. Fill the bag with a solution of white vinegar and water (1 cup vinegar to 1 qt warm water [.2 L vinegar to 1 L warm water]). Close the bag and soak it for 30 minutes in the solution.  5. Rinse the bag with warm water.   6. Hang the bag to dry with the pour spout open and hanging downward.  7. Store the clean bag (once it is dry) in a clean plastic bag.  8. Wash your hands well.  PREVENTING INFECTION  · Wash your hands before and after handling your catheter.  · Take showers daily and wash the area where the catheter enters your body. Do not take baths. Replace wet leg straps with dry ones, if this applies.  · Do not use powders, sprays, or lotions on the genital area. Only use creams, lotions, or ointments as directed by your caregiver.  · For females, wipe from front to back after each bowel movement.  · Drink enough fluids to keep your urine clear or pale yellow unless you have a fluid restriction.  · Do not let the drainage bag or tubing touch or lie on the floor.  · Wear cotton underwear to absorb moisture and to keep your skin drier.  SEEK MEDICAL CARE IF:   · Your urine is cloudy or smells unusually bad.  · Your catheter becomes clogged.  · You are not draining urine into the bag or your bladder feels full.  · Your catheter starts to leak.  SEEK IMMEDIATE MEDICAL CARE IF:   · You have pain, swelling, redness, or pus where the catheter enters the body.  · You have pain in the abdomen, legs, lower back, or bladder.  · You have a fever.  · You see blood fill the catheter, or your urine is pink or red.  · You have nausea, vomiting, or chills.  · Your catheter gets pulled out.  MAKE SURE YOU:   · Understand these instructions.  · Will watch your condition.  · Will get help right away if you are not doing well or get worse.     This information is not intended to replace advice given to you by your health care provider. Make sure you discuss any questions you have with your health care provider.     Document Released: 07/16/2005 Document Revised: 11/30/2013 Document Reviewed: 07/02/2015  Elsevier Interactive Patient Education ©2017 [employer]

## 2017-01-15 ENCOUNTER — Emergency Department (HOSPITAL_COMMUNITY)
Admission: EM | Admit: 2017-01-15 | Discharge: 2017-01-15 | Disposition: A | Discharge status: Home / Self Care | Payer: Medicare Other | Attending: Emergency Medicine | Admitting: Emergency Medicine

## 2017-01-15 ENCOUNTER — Encounter (HOSPITAL_COMMUNITY): Payer: Self-pay | Admitting: Emergency Medicine

## 2017-01-15 ENCOUNTER — Emergency Department (HOSPITAL_COMMUNITY): Payer: Medicare Other

## 2017-01-15 DIAGNOSIS — Z7982 Long term (current) use of aspirin: Secondary | ICD-10-CM | POA: Diagnosis not present

## 2017-01-15 DIAGNOSIS — N183 Chronic kidney disease, stage 3 (moderate): Secondary | ICD-10-CM | POA: Insufficient documentation

## 2017-01-15 DIAGNOSIS — Z8546 Personal history of malignant neoplasm of prostate: Secondary | ICD-10-CM | POA: Diagnosis not present

## 2017-01-15 DIAGNOSIS — R079 Chest pain, unspecified: Secondary | ICD-10-CM | POA: Insufficient documentation

## 2017-01-15 DIAGNOSIS — F17210 Nicotine dependence, cigarettes, uncomplicated: Secondary | ICD-10-CM | POA: Insufficient documentation

## 2017-01-15 DIAGNOSIS — Z79899 Other long term (current) drug therapy: Secondary | ICD-10-CM | POA: Insufficient documentation

## 2017-01-15 HISTORY — DX: Malignant (primary) neoplasm, unspecified: C80.1

## 2017-01-15 LAB — CBC
HEMATOCRIT: 48 % (ref 39.0–52.0)
Hemoglobin: 16.2 g/dL (ref 13.0–17.0)
MCH: 28.6 pg (ref 26.0–34.0)
MCHC: 33.8 g/dL (ref 30.0–36.0)
MCV: 84.7 fL (ref 78.0–100.0)
Platelets: 261 10*3/uL (ref 150–400)
RBC: 5.67 MIL/uL (ref 4.22–5.81)
RDW: 15.3 % (ref 11.5–15.5)
WBC: 11 10*3/uL — ABNORMAL HIGH (ref 4.0–10.5)

## 2017-01-15 LAB — BASIC METABOLIC PANEL
Anion gap: 8 (ref 5–15)
BUN: 9 mg/dL (ref 6–20)
CHLORIDE: 104 mmol/L (ref 101–111)
CO2: 19 mmol/L — ABNORMAL LOW (ref 22–32)
Calcium: 8.5 mg/dL — ABNORMAL LOW (ref 8.9–10.3)
Creatinine, Ser: 1.68 mg/dL — ABNORMAL HIGH (ref 0.61–1.24)
GFR calc Af Amer: 50 mL/min — ABNORMAL LOW (ref >60)
GFR calc non Af Amer: 43 mL/min — ABNORMAL LOW (ref >60)
GLUCOSE: 122 mg/dL — AB (ref 65–99)
POTASSIUM: 3.3 mmol/L — AB (ref 3.5–5.1)
Sodium: 131 mmol/L — ABNORMAL LOW (ref 135–145)

## 2017-01-15 LAB — I-STAT TROPONIN, ED: Troponin i, poc: 0 ng/mL (ref 0.00–0.08)

## 2017-01-15 MED ORDER — FINASTERIDE 5 MG PO TABS
10.0000 mg | ORAL_TABLET | Freq: Every day | ORAL | Status: DC
  Administered 2017-01-15: 10 mg via ORAL
  Filled 2017-01-15 (×3): qty 2

## 2017-01-15 NOTE — ED Provider Notes (Signed)
[facility] DEPT Provider Note   CSN: [id number] Arrival date & time: 01/15/17  1321     History   Chief Complaint Chief Complaint  Patient presents with  . Chest Pain    HPI Edwin [name] is a 61 y.o. male.  He complains of intermittent left anterior chest pain, present for 1 day.  The pain is worse with deep breathing and movement.  It does not hurt to touch his chest.  He denies fever, chills, nausea, vomiting, weakness or dizziness.  He has cough which is nonproductive.  He has not tried taking any medicine for it yet.  He complains of being out of his finasteride, because he has been using more than the prescribed dose at the suggestion of his urologist.  He tried to fill the prescription today but the pharmacist would not do it.  He denies weight gain, weight loss, anorexia, change in bowel or urinary habits.  There are no other known modifying factors.  HPI  Past Medical History:  Diagnosis Date  . Anxiety   . Cancer [facility])    prostate  . CKD (chronic kidney disease) stage 3, GFR 30-59 ml/min   . Depression   . GERD (gastroesophageal reflux disease)    under control  . History of Clostridium difficile colitis    03-31-2015--  resolved  . History of sepsis    secondary to UTI and ARF  x2 admission 03-31-2015 &  04-14-2015  . Hyperlipidemia   . Paranoid schizophrenia ([place])   . Urinary incontinence     Patient Active Problem List   Diagnosis Date Noted  . BPH (benign prostatic hyperplasia) 06/27/2015  . Malnutrition of moderate degree 06/13/2015  . UTI (lower urinary tract infection) 06/10/2015  . Enteritis due to Clostridium difficile 04/04/2015  . Sepsis ([place]) 03/31/2015  . Acute encephalopathy 03/31/2015  . Sepsis secondary to UTI ([place]) 01/13/2015  . Paranoid schizophrenia, chronic condition ([place]) 01/13/2015  . AKI (acute kidney injury) ([place]) 01/13/2015  . Rhabdomyolysis 01/13/2015  . HLD (hyperlipidemia) 01/13/2015    Past Surgical History:    Procedure Laterality Date  . CERVICAL DISC SURGERY  1990's  . CYSTOSCOPY N/A 03/08/2015   Procedure: CYSTOSCOPY;  Surgeon: Lowella Bandy, MD;  Location: [facility];  Service: Urology;  Laterality: N/A;  . CYSTOSCOPY  06/10/2015   Procedure: CYSTOSCOPY;  Surgeon: Cleon Gustin, MD;  Location: [facility];  Service: Urology;;  . PROSTATE BIOPSY N/A 03/08/2015   Procedure: BIOPSY TRANSRECTAL ULTRASONIC PROSTATE (TUBP);  Surgeon: Lowella Bandy, MD;  Location: [facility];  Service: Urology;  Laterality: N/A;  . TRANSTHORACIC ECHOCARDIOGRAM  06-11-2015   grade 1 diastolic dysfunction, ef 13-08%;  trivial TR  . TRANSURETHRAL RESECTION OF BLADDER TUMOR N/A 03/08/2015   Procedure: TRANSURETHRAL RESECTION OF BLADDER TUMOR (TURBT);  Surgeon: Lowella Bandy, MD;  Location: [facility];  Service: Urology;  Laterality: N/A;  TUR BIOPSY BLADDER  . TRANSURETHRAL RESECTION OF PROSTATE N/A 06/27/2015   Procedure: TRANSURETHRAL RESECTION OF THE PROSTATE WITH GYRUS INSTRUMENTS;  Surgeon: Cleon Gustin, MD;  Location: [facility];  Service: Urology;  Laterality: N/A;  . TRANSURETHRAL RESECTION OF PROSTATE N/A 07/09/2016   Procedure: TRANSURETHRAL RESECTION OF THE PROSTATE (TURP);  Surgeon: Cleon Gustin, MD;  Location: [facility];  Service: Urology;  Laterality: N/A;       Home Medications    Prior to Admission medications   Medication Sig Start Date End Date Taking? Authorizing Provider  acetaminophen (TYLENOL)  500 MG tablet Take 1,000 mg by mouth every 4 (four) hours as needed for moderate pain or headache.   Yes [provider]  aspirin EC 81 MG tablet Take 81 mg by mouth daily.   Yes [provider]  benztropine (COGENTIN) 0.5 MG tablet Take 0.5 mg by mouth every morning. 12/13/14  Yes [provider]  Cariprazine HCl (VRAYLAR) 4.5 MG CAPS Take 4.5 mg by mouth 2 (two) times daily.   Yes [provider]  cloZAPine (CLOZARIL) 100 MG tablet  Take 300 mg by mouth at bedtime.    Yes [provider]  finasteride (PROSCAR) 5 MG tablet Take 10 mg by mouth 2 (two) times daily.    Yes [provider]  simvastatin (ZOCOR) 20 MG tablet Take 20 mg by mouth daily. 10/10/14  Yes [provider]  tamsulosin (FLOMAX) 0.4 MG CAPS capsule Take 0.4 mg by mouth daily. 03/12/15  Yes [provider]  temazepam (RESTORIL) 15 MG capsule Take 15 mg by mouth at bedtime. 06/14/16  Yes [provider]  Cariprazine HCl (VRAYLAR) 3 MG CAPS Take 3 mg by mouth 2 (two) times daily.     [provider]  oxyCODONE-acetaminophen (PERCOCET/ROXICET) 5-325 MG tablet Take 1-2 tablets by mouth every 4 (four) hours as needed for moderate pain. Patient not taking: Reported on 01/15/2017 07/10/16   Cleon Gustin, MD    Family History No family history on file.  Social History Social History  Substance Use Topics  . Smoking status: Current Every Day Smoker    Packs/day: 2.00    Years: 40.00    Types: Cigarettes  . Smokeless tobacco: Never Used  . Alcohol use No     Allergies   Patient has no known allergies.   Review of Systems Review of Systems  All other systems reviewed and are negative.    Physical Exam Updated Vital Signs BP 118/85 (BP Location: Right Arm)   Pulse 75   Temp 98.2 F (36.8 C) (Oral)   Resp 16   Ht 5\' 11"  (1.803 m)   Wt 81.6 kg (180 lb)   SpO2 97%   BMI 25.10 kg/m   Physical Exam  Constitutional: He is oriented to person, place, and time. He appears well-developed.  Disheveled.  Appears older than stated age.  HENT:  Head: Normocephalic and atraumatic.  Right Ear: External ear normal.  Left Ear: External ear normal.  Eyes: Conjunctivae and EOM are normal. Pupils are equal, round, and reactive to light.  Neck: Normal range of motion and phonation normal. Neck supple.  Cardiovascular: Normal rate, regular rhythm and normal heart sounds.   Pulmonary/Chest: Effort  normal and breath sounds normal. He exhibits no tenderness and no bony tenderness.  Abdominal: Soft. He exhibits distension. He exhibits no mass. There is no tenderness. There is no guarding.  Musculoskeletal: Normal range of motion.  Neurological: He is alert and oriented to person, place, and time. No cranial nerve deficit or sensory deficit. He exhibits normal muscle tone. Coordination normal.  Skin: Skin is warm, dry and intact.  Psychiatric: His behavior is normal.  He is somewhat suspicious.  He defers some answers, to his brother who is with him during the evaluation.  Nursing note and vitals reviewed.    ED Treatments / Results  Labs (all labs ordered are listed, but only abnormal results are displayed) Labs Reviewed  BASIC METABOLIC PANEL - Abnormal; Notable for the following:       Result  Value   Sodium 131 (*)    Potassium 3.3 (*)    CO2 19 (*)    Glucose, Bld 122 (*)    Creatinine, Ser 1.68 (*)    Calcium 8.5 (*)    GFR calc non Af Amer 43 (*)    GFR calc Af Amer 50 (*)    All other components within normal limits  CBC - Abnormal; Notable for the following:    WBC 11.0 (*)    All other components within normal limits  I-STAT TROPOININ, ED    EKG  EKG Interpretation  Date/Time:  Tuesday January 15 2017 13:25:35 EDT Ventricular Rate:  107 PR Interval:  186 QRS Duration: 82 QT Interval:  342 QTC Calculation: 456 R Axis:   49 Text Interpretation:  Sinus tachycardia Inferior infarct , age undetermined Abnormal ECG Since last tracing rate faster no other significant change. Reconfirmed by Daleen Bo [phone]) on 01/15/2017 4:12:44 PM       Radiology Dg Chest 2 View  Result Date: 01/15/2017 CLINICAL DATA:  Left-sided chest pain radiating to the left arm and neck. History of prostate malignancy, chronic renal insufficiency, current smoker. EXAM: CHEST  2 VIEW COMPARISON:  Portable chest x-ray of June 10, 2015 and [place] and lateral chest x-ray dated April 14, 2015. FINDINGS: The lungs are adequately inflated. The interstitial markings are coarse. There is no alveolar infiltrate or pleural effusion. The heart and pulmonary vascularity are normal. The mediastinum is normal in width. There is multilevel degenerative disc disease of the thoracic spine. There is no compression fracture. IMPRESSION: Mild chronic bronchitic changes. No pneumonia, CHF, nor other acute cardiopulmonary abnormality. Electronically Signed   By: Keahi  Martinique M.D.   On: 01/15/2017 14:41    Procedures Procedures (including critical care time)  Medications Ordered in ED Medications  finasteride (PROSCAR) tablet 10 mg (10 mg Oral Given 01/15/17 1804)     Initial Impression / Assessment and Plan / ED Course  I have reviewed the triage vital signs and the nursing notes.  Pertinent labs & imaging results that were available during my care of the patient were reviewed by me and considered in my medical decision making (see chart for details).      Patient Vitals for the past 24 hrs:  BP Temp Temp src Pulse Resp SpO2 Height Weight  01/15/17 1804 118/85 - - 75 16 97 % - -  01/15/17 1801 - - - - - 97 % - -  01/15/17 1800 118/85 - - 82 18 - - -  01/15/17 1745 117/80 - - 72 19 98 % - -  01/15/17 1730 115/86 - - 76 17 97 % - -  01/15/17 1708 119/81 - - 78 18 100 % - -  01/15/17 1645 113/88 - - 79 (!) 23 96 % - -  01/15/17 1615 120/82 - - 84 18 96 % - -  01/15/17 1549 118/87 - - 87 19 97 % - -  01/15/17 1329 116/79 98.2 F (36.8 C) Oral (!) 105 20 96 % - -  01/15/17 1327 - - - - - - 5\' 11"  (1.803 m) 81.6 kg (180 lb)    5:14 PM Reevaluation with update and discussion. After initial assessment and treatment, an updated evaluation reveals no change in clinical status.  Findings discussed with patient and his brother, all questions were answered. Chane Magner L    Final Clinical Impressions(s) / ED Diagnoses   Final diagnoses:  Nonspecific chest pain  Nonspecific chest  pain, doubt ACS, PE or pneumonia.  Patient is out of his finasteride, because of an increased dosage, without adequate supply.  He was given a dose here today and instructed to contact his urologist to get a new prescription.  Nursing Notes Reviewed/ Care Coordinated Applicable Imaging Reviewed Interpretation of Laboratory Data incorporated into ED treatment  The patient appears reasonably screened and/or stabilized for discharge and I doubt any other medical condition or other [facility] requiring further screening, evaluation, or treatment in the ED at this time prior to discharge.  Plan: Home Medications-continue usual medications, use Tylenol if needed for pain relief; Home Treatments-rest, fluids; return here if the recommended treatment, does not improve the symptoms; Recommended follow up-PCP, as needed   New Prescriptions New Prescriptions   No medications on file     Daleen Bo, MD 01/15/17 1807

## 2017-01-15 NOTE — Discharge Instructions (Signed)
The testing today is reassuring.  If you need to, you can take Tylenol for chest pain.  Call your urologist in the morning, to get a new prescription for the increased dose of finasteride.  Return here, if needed, for problems.

## 2017-01-15 NOTE — ED Triage Notes (Signed)
Pt arrives from home c/o CP, left sided radiating to LUE and neck, describes pain as "just hurts."  Pt denies SOB, n/v, LOC.  resp e/u.

## 2017-08-10 ENCOUNTER — Inpatient Hospital Stay (HOSPITAL_COMMUNITY)
Admission: EM | Admit: 2017-08-10 | Discharge: 2017-08-14 | DRG: 871 | Disposition: A | Discharge status: Home / Self Care | Payer: Medicare Other | Attending: Family Medicine | Admitting: Family Medicine

## 2017-08-10 ENCOUNTER — Emergency Department (HOSPITAL_COMMUNITY): Payer: Medicare Other

## 2017-08-10 ENCOUNTER — Encounter (HOSPITAL_COMMUNITY): Payer: Self-pay | Admitting: Emergency Medicine

## 2017-08-10 ENCOUNTER — Inpatient Hospital Stay (HOSPITAL_COMMUNITY): Payer: Medicare Other

## 2017-08-10 DIAGNOSIS — E785 Hyperlipidemia, unspecified: Secondary | ICD-10-CM | POA: Diagnosis present

## 2017-08-10 DIAGNOSIS — J9600 Acute respiratory failure, unspecified whether with hypoxia or hypercapnia: Secondary | ICD-10-CM | POA: Diagnosis present

## 2017-08-10 DIAGNOSIS — Z7982 Long term (current) use of aspirin: Secondary | ICD-10-CM

## 2017-08-10 DIAGNOSIS — Z8546 Personal history of malignant neoplasm of prostate: Secondary | ICD-10-CM | POA: Diagnosis not present

## 2017-08-10 DIAGNOSIS — E871 Hypo-osmolality and hyponatremia: Secondary | ICD-10-CM | POA: Diagnosis not present

## 2017-08-10 DIAGNOSIS — N138 Other obstructive and reflux uropathy: Secondary | ICD-10-CM | POA: Diagnosis present

## 2017-08-10 DIAGNOSIS — D72829 Elevated white blood cell count, unspecified: Secondary | ICD-10-CM | POA: Diagnosis not present

## 2017-08-10 DIAGNOSIS — J441 Chronic obstructive pulmonary disease with (acute) exacerbation: Secondary | ICD-10-CM | POA: Diagnosis present

## 2017-08-10 DIAGNOSIS — T380X5A Adverse effect of glucocorticoids and synthetic analogues, initial encounter: Secondary | ICD-10-CM | POA: Diagnosis present

## 2017-08-10 DIAGNOSIS — F17210 Nicotine dependence, cigarettes, uncomplicated: Secondary | ICD-10-CM | POA: Diagnosis present

## 2017-08-10 DIAGNOSIS — N401 Enlarged prostate with lower urinary tract symptoms: Secondary | ICD-10-CM | POA: Diagnosis present

## 2017-08-10 DIAGNOSIS — N183 Chronic kidney disease, stage 3 (moderate): Secondary | ICD-10-CM | POA: Diagnosis present

## 2017-08-10 DIAGNOSIS — A419 Sepsis, unspecified organism: Principal | ICD-10-CM | POA: Diagnosis present

## 2017-08-10 DIAGNOSIS — T68XXXA Hypothermia, initial encounter: Secondary | ICD-10-CM | POA: Diagnosis present

## 2017-08-10 DIAGNOSIS — N1330 Unspecified hydronephrosis: Secondary | ICD-10-CM | POA: Diagnosis present

## 2017-08-10 DIAGNOSIS — J189 Pneumonia, unspecified organism: Secondary | ICD-10-CM | POA: Diagnosis present

## 2017-08-10 DIAGNOSIS — J181 Lobar pneumonia, unspecified organism: Secondary | ICD-10-CM | POA: Diagnosis not present

## 2017-08-10 DIAGNOSIS — Z79899 Other long term (current) drug therapy: Secondary | ICD-10-CM | POA: Diagnosis not present

## 2017-08-10 DIAGNOSIS — R05 Cough: Secondary | ICD-10-CM

## 2017-08-10 DIAGNOSIS — J440 Chronic obstructive pulmonary disease with acute lower respiratory infection: Secondary | ICD-10-CM | POA: Diagnosis present

## 2017-08-10 DIAGNOSIS — E860 Dehydration: Secondary | ICD-10-CM | POA: Diagnosis present

## 2017-08-10 DIAGNOSIS — W19XXXA Unspecified fall, initial encounter: Secondary | ICD-10-CM | POA: Diagnosis present

## 2017-08-10 DIAGNOSIS — F259 Schizoaffective disorder, unspecified: Secondary | ICD-10-CM | POA: Diagnosis present

## 2017-08-10 DIAGNOSIS — K5900 Constipation, unspecified: Secondary | ICD-10-CM | POA: Diagnosis present

## 2017-08-10 DIAGNOSIS — F200 Paranoid schizophrenia: Secondary | ICD-10-CM | POA: Diagnosis present

## 2017-08-10 DIAGNOSIS — R059 Cough, unspecified: Secondary | ICD-10-CM

## 2017-08-10 LAB — COMPREHENSIVE METABOLIC PANEL
ALT: 36 U/L (ref 17–63)
AST: 43 U/L — ABNORMAL HIGH (ref 15–41)
Albumin: 3.5 g/dL (ref 3.5–5.0)
Alkaline Phosphatase: 118 U/L (ref 38–126)
Anion gap: 10 (ref 5–15)
BUN: 24 mg/dL — ABNORMAL HIGH (ref 6–20)
CHLORIDE: 97 mmol/L — AB (ref 101–111)
CO2: 23 mmol/L (ref 22–32)
Calcium: 9 mg/dL (ref 8.9–10.3)
Creatinine, Ser: 1.68 mg/dL — ABNORMAL HIGH (ref 0.61–1.24)
GFR, EST AFRICAN AMERICAN: 50 mL/min — AB (ref >60)
GFR, EST NON AFRICAN AMERICAN: 43 mL/min — AB (ref >60)
Glucose, Bld: 86 mg/dL (ref 65–99)
Potassium: 3.9 mmol/L (ref 3.5–5.1)
Sodium: 130 mmol/L — ABNORMAL LOW (ref 135–145)
TOTAL PROTEIN: 7.1 g/dL (ref 6.5–8.1)
Total Bilirubin: 0.5 mg/dL (ref 0.3–1.2)

## 2017-08-10 LAB — CBC
HCT: 43.8 % (ref 39.0–52.0)
HEMOGLOBIN: 15.4 g/dL (ref 13.0–17.0)
MCH: 28.8 pg (ref 26.0–34.0)
MCHC: 35.2 g/dL (ref 30.0–36.0)
MCV: 81.9 fL (ref 78.0–100.0)
Platelets: 281 10*3/uL (ref 150–400)
RBC: 5.35 MIL/uL (ref 4.22–5.81)
RDW: 13.9 % (ref 11.5–15.5)
WBC: 23.5 10*3/uL — ABNORMAL HIGH (ref 4.0–10.5)

## 2017-08-10 LAB — I-STAT TROPONIN, ED: Troponin i, poc: 0 ng/mL (ref 0.00–0.08)

## 2017-08-10 LAB — URINALYSIS, ROUTINE W REFLEX MICROSCOPIC
Bacteria, UA: NONE SEEN
Bilirubin Urine: NEGATIVE
Glucose, UA: NEGATIVE mg/dL
Ketones, ur: NEGATIVE mg/dL
Nitrite: NEGATIVE
PROTEIN: NEGATIVE mg/dL
SQUAMOUS EPITHELIAL / LPF: NONE SEEN
Specific Gravity, Urine: 1.002 — ABNORMAL LOW (ref 1.005–1.030)
pH: 6 (ref 5.0–8.0)

## 2017-08-10 LAB — TROPONIN I: Troponin I: <0.03 ng/mL (ref <0.03)

## 2017-08-10 LAB — CBG MONITORING, ED: GLUCOSE-CAPILLARY: 90 mg/dL (ref 65–99)

## 2017-08-10 LAB — I-STAT CG4 LACTIC ACID, ED: LACTIC ACID, VENOUS: 0.83 mmol/L (ref 0.5–1.9)

## 2017-08-10 LAB — D-DIMER, QUANTITATIVE: D-Dimer, Quant: 0.55 ug/mL-FEU — ABNORMAL HIGH (ref 0.00–0.50)

## 2017-08-10 LAB — TSH: TSH: 1.054 u[IU]/mL (ref 0.350–4.500)

## 2017-08-10 MED ORDER — DEXTROSE 5 % IV SOLN
500.0000 mg | Freq: Once | INTRAVENOUS | Status: AC
  Administered 2017-08-10: 500 mg via INTRAVENOUS
  Filled 2017-08-10: qty 500

## 2017-08-10 MED ORDER — DEXTROSE 5 % IV SOLN
1.0000 g | Freq: Once | INTRAVENOUS | Status: AC
  Administered 2017-08-10: 1 g via INTRAVENOUS
  Filled 2017-08-10: qty 10

## 2017-08-10 MED ORDER — SODIUM CHLORIDE 0.9 % IV BOLUS (SEPSIS)
1000.0000 mL | Freq: Once | INTRAVENOUS | Status: AC
  Administered 2017-08-10: 1000 mL via INTRAVENOUS

## 2017-08-10 MED ORDER — SODIUM CHLORIDE 0.9 % IV SOLN
INTRAVENOUS | Status: DC
  Administered 2017-08-10 – 2017-08-13 (×5): via INTRAVENOUS

## 2017-08-10 NOTE — ED Notes (Signed)
Patient brother is robbie, can be reached at [phone]

## 2017-08-10 NOTE — ED Notes (Signed)
Pt currently wearing an incontinence brief.  Writer placed condom cath on pt to obtain urine sample.

## 2017-08-10 NOTE — ED Provider Notes (Signed)
[facility] DEPT Provider Note   CSN: [id number] Arrival date & time: 08/10/17  1748     History   Chief Complaint Chief Complaint  Patient presents with  . Cough  . Fall    HPI AARAV BURGETT is a 62 y.o. male.  62 year old male presents with increasing weakness times several days with associated cough and congestion.  Seen in urgent care for similar symptoms 4 days ago and according to family had a negative chest x-ray.  Was diagnosed with stage IV kidney disease according to his brother but no follow-up given for that.  Patient states that his legs become weak after standing for prolonged periods of time and he falls down.  Denies any head injury.  No confusion or vomiting or diarrhea.  No reported fevers.  Denies any new medication usage.  No abdominal or chest discomfort.  Does have a history of UTIs but denies any dysuria or hematuria.      Past Medical History:  Diagnosis Date  . Anxiety   . Cancer [facility])    prostate  . CKD (chronic kidney disease) stage 3, GFR 30-59 ml/min (HCC)   . Depression   . GERD (gastroesophageal reflux disease)    under control  . History of Clostridium difficile colitis    03-31-2015--  resolved  . History of sepsis    secondary to UTI and ARF  x2 admission 03-31-2015 &  04-14-2015  . Hyperlipidemia   . Paranoid schizophrenia ([place])   . Urinary incontinence     Patient Active Problem List   Diagnosis Date Noted  . BPH (benign prostatic hyperplasia) 06/27/2015  . Malnutrition of moderate degree 06/13/2015  . UTI (lower urinary tract infection) 06/10/2015  . Enteritis due to Clostridium difficile 04/04/2015  . Sepsis ([place]) 03/31/2015  . Acute encephalopathy 03/31/2015  . Sepsis secondary to UTI ([place]) 01/13/2015  . Paranoid schizophrenia, chronic condition ([place]) 01/13/2015  . AKI (acute kidney injury) ([place]) 01/13/2015  . Rhabdomyolysis 01/13/2015  . HLD (hyperlipidemia) 01/13/2015    Past Surgical  History:  Procedure Laterality Date  . CERVICAL DISC SURGERY  1990's  . CYSTOSCOPY N/A 03/08/2015   Procedure: CYSTOSCOPY;  Surgeon: Lowella Bandy, MD;  Location: [facility];  Service: Urology;  Laterality: N/A;  . CYSTOSCOPY  06/10/2015   Procedure: CYSTOSCOPY;  Surgeon: Cleon Gustin, MD;  Location: [facility];  Service: Urology;;  . PROSTATE BIOPSY N/A 03/08/2015   Procedure: BIOPSY TRANSRECTAL ULTRASONIC PROSTATE (TUBP);  Surgeon: Lowella Bandy, MD;  Location: [facility];  Service: Urology;  Laterality: N/A;  . TRANSTHORACIC ECHOCARDIOGRAM  06-11-2015   grade 1 diastolic dysfunction, ef 40-98%;  trivial TR  . TRANSURETHRAL RESECTION OF BLADDER TUMOR N/A 03/08/2015   Procedure: TRANSURETHRAL RESECTION OF BLADDER TUMOR (TURBT);  Surgeon: Lowella Bandy, MD;  Location: [facility];  Service: Urology;  Laterality: N/A;  TUR BIOPSY BLADDER  . TRANSURETHRAL RESECTION OF PROSTATE N/A 06/27/2015   Procedure: TRANSURETHRAL RESECTION OF THE PROSTATE WITH GYRUS INSTRUMENTS;  Surgeon: Cleon Gustin, MD;  Location: [facility];  Service: Urology;  Laterality: N/A;  . TRANSURETHRAL RESECTION OF PROSTATE N/A 07/09/2016   Procedure: TRANSURETHRAL RESECTION OF THE PROSTATE (TURP);  Surgeon: Cleon Gustin, MD;  Location: [facility];  Service: Urology;  Laterality: N/A;       Home Medications    Prior to Admission medications   Medication Sig Start Date End Date Taking? Authorizing Provider  acetaminophen (TYLENOL) 500 MG  tablet Take 1,000 mg by mouth every 4 (four) hours as needed for moderate pain or headache.    [provider]  aspirin EC 81 MG tablet Take 81 mg by mouth daily.    [provider]  benztropine (COGENTIN) 0.5 MG tablet Take 0.5 mg by mouth every morning. 12/13/14   [provider]  Cariprazine HCl (VRAYLAR) 3 MG CAPS Take 3 mg by mouth 2 (two) times daily.     [provider]  Cariprazine HCl (VRAYLAR) 4.5 MG CAPS Take  4.5 mg by mouth 2 (two) times daily.    [provider]  cloZAPine (CLOZARIL) 100 MG tablet Take 300 mg by mouth at bedtime.     [provider]  finasteride (PROSCAR) 5 MG tablet Take 10 mg by mouth 2 (two) times daily.     [provider]  oxyCODONE-acetaminophen (PERCOCET/ROXICET) 5-325 MG tablet Take 1-2 tablets by mouth every 4 (four) hours as needed for moderate pain. Patient not taking: Reported on 01/15/2017 07/10/16   Cleon Gustin, MD  simvastatin (ZOCOR) 20 MG tablet Take 20 mg by mouth daily. 10/10/14   [provider]  tamsulosin (FLOMAX) 0.4 MG CAPS capsule Take 0.4 mg by mouth daily. 03/12/15   [provider]  temazepam (RESTORIL) 15 MG capsule Take 15 mg by mouth at bedtime. 06/14/16   [provider]    Family History History reviewed. No pertinent family history.  Social History Social History   Tobacco Use  . Smoking status: Current Every Day Smoker    Packs/day: 2.00    Years: 40.00    Pack years: 80.00    Types: Cigarettes  . Smokeless tobacco: Never Used  Substance Use Topics  . Alcohol use: No  . Drug use: No     Allergies   Patient has no known allergies.   Review of Systems Review of Systems  All other systems reviewed and are negative.    Physical Exam Updated Vital Signs BP 109/81   Pulse 77   Temp (!) 92.7 F (33.7 C) (Rectal)   Resp 16   SpO2 96%   Physical Exam  Constitutional: He is oriented to person, place, and time. He appears well-developed and well-nourished.  Non-toxic appearance. No distress.  HENT:  Head: Normocephalic and atraumatic.  Eyes: Conjunctivae, EOM and lids are normal. Pupils are equal, round, and reactive to light.  Neck: Normal range of motion. Neck supple. No tracheal deviation present. No thyroid mass present.  Cardiovascular: Normal rate, regular rhythm and normal heart sounds. Exam reveals no gallop.  No murmur heard. Pulmonary/Chest: Effort normal  and breath sounds normal. No stridor. No respiratory distress. He has no decreased breath sounds. He has no wheezes. He has no rhonchi. He has no rales.  Abdominal: Soft. Normal appearance and bowel sounds are normal. He exhibits no distension. There is no tenderness. There is no rebound and no CVA tenderness.  Musculoskeletal: Normal range of motion. He exhibits no edema or tenderness.  Neurological: He is alert and oriented to person, place, and time. He has normal strength. No cranial nerve deficit or sensory deficit. GCS eye subscore is 4. GCS verbal subscore is 5. GCS motor subscore is 6.  Skin: Skin is warm and dry. No abrasion and no rash noted.  Psychiatric: His affect is blunt. His speech is delayed. He is slowed.  Nursing note and vitals reviewed.    ED Treatments / Results  Labs (all labs ordered are listed, but  only abnormal results are displayed) Labs Reviewed  CULTURE, BLOOD (ROUTINE X 2)  CULTURE, BLOOD (ROUTINE X 2)  CBC  URINALYSIS, ROUTINE W REFLEX MICROSCOPIC  COMPREHENSIVE METABOLIC PANEL  CBG MONITORING, ED  I-STAT TROPONIN, ED  I-STAT CG4 LACTIC ACID, ED  CBG MONITORING, ED    EKG  EKG Interpretation  Date/Time:  Saturday August 10 2017 18:47:14 EST Ventricular Rate:  75 PR Interval:    QRS Duration: 150 QT Interval:  418 QTC Calculation: 467 R Axis:   1 Text Interpretation:  Sinus rhythm Consider right atrial enlargement Nonspecific intraventricular conduction delay Artifact in lead(s) I II III aVR aVL aVF V6 Confirmed by Lacretia Leigh ([id number]) on 08/10/2017 7:15:50 PM       Radiology Dg Chest 2 View  Result Date: 08/10/2017 CLINICAL DATA:  One week history of cough and dizziness. EXAM: CHEST  2 VIEW COMPARISON:  01/15/2017 FINDINGS: The cardiac silhouette, mediastinal and hilar contours are within normal limits. Low lung volumes with vascular crowding and streaky basilar atelectasis. No definite infiltrates or effusions. No pneumothorax. The bony  thorax is intact. IMPRESSION: Low lung volumes with vascular crowding and streaky basilar atelectasis. No definite infiltrates or effusions. Electronically Signed   By: Marijo Sanes M.D.   On: 08/10/2017 19:14    Procedures Procedures (including critical care time)  Medications Ordered in ED Medications - No data to display   Initial Impression / Assessment and Plan / ED Course  I have reviewed the triage vital signs and the nursing notes.  Pertinent labs & imaging results that were available during my care of the patient were reviewed by me and considered in my medical decision making (see chart for details).     Patient with leukocytosis and clinical symptoms consistent with pneumonia.  Code sepsis initiated.  IV antibiotic started.  Patient also hypothermic and placed on Bair hugger.  Patient reassessed multiple times and is mentating appropriately will be admitted to the hospitalist service.   CRITICAL CARE Performed by: Leota Jacobsen Total critical care time: 50 minutes Critical care time was exclusive of separately billable procedures and treating other patients. Critical care was necessary to treat or prevent imminent or life-threatening deterioration. Critical care was time spent personally by me on the following activities: development of treatment plan with patient and/or surrogate as well as nursing, discussions with consultants, evaluation of patient's response to treatment, examination of patient, obtaining history from patient or surrogate, ordering and performing treatments and interventions, ordering and review of laboratory studies, ordering and review of radiographic studies, pulse oximetry and re-evaluation of patient's condition.   Final Clinical Impressions(s) / ED Diagnoses   Final diagnoses:  None    ED Discharge Orders    None       Lacretia Leigh, MD 08/10/17 2155

## 2017-08-10 NOTE — ED Notes (Signed)
Patient transported to CT 

## 2017-08-10 NOTE — H&P (Addendum)
TRH H&P   Patient Demographics:    Edwin [name], is a 62 y.o. male  MRN: [id number]   DOB - 1955-11-08  Admit Date - 08/10/2017  Outpatient Primary MD for the patient is [facility]  Referring MD/NP/PA: Vivi Martens  Outpatient Specialists:   Patient coming from: home  Chief Complaint  Patient presents with  . Cough  . Fall      HPI:    Edwin [name]  is a 62 y.o. male, w paranoid schizophrenia, hyperlipidemia, ckd stage3, prostate cancer, apparently presents with c/o several days of cough and congestion, , seen in urgent care 4 days ago per family.  Today pt had slight episode of unresponsiveness, and was brought to ED for evaluation  In Ed,  Wbc 23.5, hgb 15.4, Plt 281 Na 130, Bun 24, Creatinine 1.68  Trop <0.03  CXR  IMPRESSION: Low lung volumes with vascular crowding and streaky basilar atelectasis. No definite infiltrates or effusions.  CT brain IMPRESSION: Unremarkable noncontrast CT of the brain.    Review of systems:    In addition to the HPI above,  No Fever-chills, No Headache, No changes with Vision or hearing, No problems swallowing food or Liquids, No Chest pain,  No Abdominal pain, No Nausea or Vommitting, Bowel movements are regular, No Blood in stool or Urine, No dysuria, No new skin rashes or bruises, No new joints pains-aches,  No new weakness, tingling, numbness in any extremity, No recent weight gain or loss, No polyuria, polydypsia or polyphagia, No significant Mental Stressors.  A full 10 point Review of Systems was done, except as stated above, all other Review of Systems were negative.   With Past History of the following :    Past Medical History:  Diagnosis Date  . Anxiety   . Cancer [facility])    prostate  . CKD (chronic kidney disease) stage 3, GFR 30-59 ml/min (HCC)   . Depression   . GERD (gastroesophageal  reflux disease)    under control  . History of Clostridium difficile colitis    03-31-2015--  resolved  . History of sepsis    secondary to UTI and ARF  x2 admission 03-31-2015 &  04-14-2015  . Hyperlipidemia   . Paranoid schizophrenia ([place])   . Urinary incontinence       Past Surgical History:  Procedure Laterality Date  . CERVICAL DISC SURGERY  1990's  . CYSTOSCOPY N/A 03/08/2015   Procedure: CYSTOSCOPY;  Surgeon: Lowella Bandy, MD;  Location: [facility];  Service: Urology;  Laterality: N/A;  . CYSTOSCOPY  06/10/2015   Procedure: CYSTOSCOPY;  Surgeon: Cleon Gustin, MD;  Location: [facility];  Service: Urology;;  . PROSTATE BIOPSY N/A 03/08/2015   Procedure: BIOPSY TRANSRECTAL ULTRASONIC PROSTATE (TUBP);  Surgeon: Lowella Bandy, MD;  Location: [facility];  Service: Urology;  Laterality: N/A;  . TRANSTHORACIC ECHOCARDIOGRAM  06-11-2015  grade 1 diastolic dysfunction, ef 81-19%;  trivial TR  . TRANSURETHRAL RESECTION OF BLADDER TUMOR N/A 03/08/2015   Procedure: TRANSURETHRAL RESECTION OF BLADDER TUMOR (TURBT);  Surgeon: Lowella Bandy, MD;  Location: [facility];  Service: Urology;  Laterality: N/A;  TUR BIOPSY BLADDER  . TRANSURETHRAL RESECTION OF PROSTATE N/A 06/27/2015   Procedure: TRANSURETHRAL RESECTION OF THE PROSTATE WITH GYRUS INSTRUMENTS;  Surgeon: Cleon Gustin, MD;  Location: [facility];  Service: Urology;  Laterality: N/A;  . TRANSURETHRAL RESECTION OF PROSTATE N/A 07/09/2016   Procedure: TRANSURETHRAL RESECTION OF THE PROSTATE (TURP);  Surgeon: Cleon Gustin, MD;  Location: [facility];  Service: Urology;  Laterality: N/A;      Social History:     Social History   Tobacco Use  . Smoking status: Current Every Day Smoker    Packs/day: 2.00    Years: 40.00    Pack years: 80.00    Types: Cigarettes  . Smokeless tobacco: Never Used  Substance Use Topics  . Alcohol use: No     Lives - at home  Mobility - walks by self   Family  History :     Family History  Problem Relation Age of Onset  . Breast cancer Mother       Home Medications:   Prior to Admission medications   Medication Sig Start Date End Date Taking? Authorizing Provider  acetaminophen (TYLENOL) 500 MG tablet Take 1,000 mg by mouth every 4 (four) hours as needed for moderate pain or headache.   Yes [provider]  aspirin EC 81 MG tablet Take 81 mg by mouth daily.   Yes [provider]  benztropine (COGENTIN) 0.5 MG tablet Take 0.5 mg by mouth every morning. 12/13/14  Yes [provider]  bethanechol (URECHOLINE) 10 MG tablet Take 10 mg by mouth 2 (two) times daily. 07/24/17  Yes [provider]  cephALEXin (KEFLEX) 500 MG capsule Take 500 mg by mouth 2 (two) times daily.   Yes [provider]  clozapine (CLOZARIL) 200 MG tablet Take 600 mg by mouth at bedtime. 07/25/17  Yes [provider]  finasteride (PROSCAR) 5 MG tablet Take 10 mg by mouth 2 (two) times daily.    Yes [provider]  simvastatin (ZOCOR) 20 MG tablet Take 20 mg by mouth daily. 10/10/14  Yes [provider]  tamsulosin (FLOMAX) 0.4 MG CAPS capsule Take 0.4 mg by mouth daily. 03/12/15  Yes [provider]  temazepam (RESTORIL) 15 MG capsule Take 15 mg by mouth at bedtime. 06/14/16  Yes [provider]  Vitamin D, Ergocalciferol, (DRISDOL) 50000 units CAPS capsule Take 50,000 Units by mouth once a week. 07/22/17  Yes [provider]  VRAYLAR 4.5 MG CAPS Take 1 capsule by mouth every morning. 07/07/17  Yes [provider]  oxyCODONE-acetaminophen (PERCOCET/ROXICET) 5-325 MG tablet Take 1-2 tablets by mouth every 4 (four) hours as needed for moderate pain. Patient not taking: Reported on 01/15/2017 07/10/16   Cleon Gustin, MD     Allergies:    No Known Allergies   Physical Exam:   Vitals  Blood pressure 106/62, pulse 84, temperature (!) 93.9 F (34.4 C), temperature  source Rectal, resp. rate (!) 22, SpO2 90 %.   1. General  lying in bed in NAD,    2. Normal affect and insight, Not Suicidal or Homicidal, Awake Alert, Oriented X 3.  3. No F.N deficits, ALL C.Nerves Intact, Strength 5/5 all 4 extremities,  Sensation intact all 4 extremities, Plantars down going.  4. Ears and Eyes appear Normal, Conjunctivae clear, PERRLA. Moist Oral Mucosa.  5. Supple Neck, No JVD, No cervical lymphadenopathy appriciated, No Carotid Bruits.  6. Symmetrical Chest wall movement, Poor air movement bilaterally, slight bilateral exp wheezing, no crackles.   7. RRR, No Gallops, Rubs or Murmurs, No Parasternal Heave.  8. Positive Bowel Sounds, Abdomen Soft, No tenderness, No organomegaly appriciated,No rebound -guarding or rigidity.  9.  No Cyanosis, Normal Skin Turgor, No Skin Rash or Bruise.  10. Good muscle tone,  joints appear normal , no effusions, Normal ROM.  11. No Palpable Lymph Nodes in Neck or Axillae    Data Review:    CBC Recent Labs  Lab 08/10/17 1946  WBC 23.5*  HGB 15.4  HCT 43.8  PLT 281  MCV 81.9  MCH 28.8  MCHC 35.2  RDW 13.9   ------------------------------------------------------------------------------------------------------------------  Chemistries  Recent Labs  Lab 08/10/17 1946  NA 130*  K 3.9  CL 97*  CO2 23  GLUCOSE 86  BUN 24*  CREATININE 1.68*  CALCIUM 9.0  AST 43*  ALT 36  ALKPHOS 118  BILITOT 0.5   ------------------------------------------------------------------------------------------------------------------ CrCl cannot be calculated (Unknown ideal weight.). ------------------------------------------------------------------------------------------------------------------ Recent Labs    08/10/17 1946  TSH 1.054    Coagulation profile No results for input(s): INR, PROTIME in the last 168  hours. ------------------------------------------------------------------------------------------------------------------- No results for input(s): DDIMER in the last 72 hours. -------------------------------------------------------------------------------------------------------------------  Cardiac Enzymes No results for input(s): CKMB, TROPONINI, MYOGLOBIN in the last 168 hours.  Invalid input(s): CK ------------------------------------------------------------------------------------------------------------------ No results found for: BNP   ---------------------------------------------------------------------------------------------------------------  Urinalysis    Component Value Date/Time   COLORURINE YELLOW 04/14/2015 1447   APPEARANCEUR CLOUDY (A) 04/14/2015 1447   LABSPEC 1.004 (L) 04/14/2015 1447   PHURINE 6.5 04/14/2015 1447   GLUCOSEU NEGATIVE 04/14/2015 1447   HGBUR LARGE (A) 04/14/2015 1447   BILIRUBINUR NEGATIVE 04/14/2015 1447   KETONESUR NEGATIVE 04/14/2015 1447   PROTEINUR 30 (A) 04/14/2015 1447   UROBILINOGEN 0.2 04/14/2015 1447   NITRITE NEGATIVE 04/14/2015 1447   LEUKOCYTESUR LARGE (A) 04/14/2015 1447    ----------------------------------------------------------------------------------------------------------------   Imaging Results:    Dg Chest 2 View  Result Date: 08/10/2017 CLINICAL DATA:  One week history of cough and dizziness. EXAM: CHEST  2 VIEW COMPARISON:  01/15/2017 FINDINGS: The cardiac silhouette, mediastinal and hilar contours are within normal limits. Low lung volumes with vascular crowding and streaky basilar atelectasis. No definite infiltrates or effusions. No pneumothorax. The bony thorax is intact. IMPRESSION: Low lung volumes with vascular crowding and streaky basilar atelectasis. No definite infiltrates or effusions. Electronically Signed   By: Marijo Sanes M.D.   On: 08/10/2017 19:14      Assessment & Plan:    Principal Problem:    Hypothermia Active Problems:   Leukocytosis   Hyponatremia    Hypothermia  ? sepsis Bear hugger Blood culture x2 Vanco iv, zosyn iv pharmacy to dose  Leukocytosis unclear etiology resp viral panel Check CT chest abd/ pelvis w/o contrast r/o pneumonia or cholecystitis Trop I q6h x3 Check cbc in am  Hyponatremia Check serum osm, cortisol, tsh Check urine sodium, urine osm  Renal insufficiency Hydrate with ns iv Check cmp in am  ABnormal liver function Check acute hepatitis panel Check CT scan abd/ pelvis  Copd exacerbation Solumedrol 80mg  iv q8h Cont iv abx as above Albuterol neb q6h and q6h prn   DVT Prophylaxis    Lovenox - SCDs  AM Labs Ordered, also please review Full Orders  Family Communication: Admission, patients condition and plan of care including tests being ordered have been discussed with the patient  who indicate understanding and agree with the plan and Code Status.  Code Status  FULL CODE  Likely DC to  home  Condition GUARDED    Consults called: none  Admission status: inpatient   Time spent in minutes : 45   Jani Gravel M.D on 08/10/2017 at 10:00 PM  Between 7am to 7pm - Pager - [phone]   After 7pm go to [email or web address] - password [facility]  [facility] - Office  [phone]

## 2017-08-10 NOTE — ED Triage Notes (Signed)
Family reports pt has had a hoarse voice, cough and falls for the past week. Pt adds that he has felt dizzy. No n/v/d. Hx of UTIs; pt feels similar to when this occurs.

## 2017-08-10 NOTE — Progress Notes (Signed)
A consult was received from an ED physician for Ceftriaxone and Azithromycin per pharmacy dosing.  The patient's profile has been reviewed for ht/wt/allergies/indication/available labs.   A one time order has already been placed by MD for Ceftriaxone 1g IV and Azithromycin 500mg  IV.  Further antibiotics/pharmacy consults should be ordered by admitting physician if indicated.                       Thank you, Luiz Ochoa 08/10/2017  9:28 PM

## 2017-08-11 ENCOUNTER — Inpatient Hospital Stay (HOSPITAL_COMMUNITY): Payer: Medicare Other

## 2017-08-11 ENCOUNTER — Encounter (HOSPITAL_COMMUNITY): Payer: Self-pay | Admitting: Radiology

## 2017-08-11 DIAGNOSIS — R059 Cough, unspecified: Secondary | ICD-10-CM | POA: Insufficient documentation

## 2017-08-11 DIAGNOSIS — R05 Cough: Secondary | ICD-10-CM | POA: Insufficient documentation

## 2017-08-11 DIAGNOSIS — N1330 Unspecified hydronephrosis: Secondary | ICD-10-CM

## 2017-08-11 DIAGNOSIS — F259 Schizoaffective disorder, unspecified: Secondary | ICD-10-CM

## 2017-08-11 DIAGNOSIS — R053 Chronic cough: Secondary | ICD-10-CM | POA: Insufficient documentation

## 2017-08-11 DIAGNOSIS — J181 Lobar pneumonia, unspecified organism: Secondary | ICD-10-CM

## 2017-08-11 DIAGNOSIS — D72829 Elevated white blood cell count, unspecified: Secondary | ICD-10-CM

## 2017-08-11 DIAGNOSIS — E871 Hypo-osmolality and hyponatremia: Secondary | ICD-10-CM

## 2017-08-11 LAB — CBC WITH DIFFERENTIAL/PLATELET
Basophils Absolute: 0 10*3/uL (ref 0.0–0.1)
Basophils Relative: 0 %
EOS ABS: 0 10*3/uL (ref 0.0–0.7)
EOS PCT: 0 %
HCT: 39.4 % (ref 39.0–52.0)
Hemoglobin: 13.5 g/dL (ref 13.0–17.0)
LYMPHS ABS: 1.5 10*3/uL (ref 0.7–4.0)
LYMPHS PCT: 10 %
MCH: 28.2 pg (ref 26.0–34.0)
MCHC: 34.3 g/dL (ref 30.0–36.0)
MCV: 82.3 fL (ref 78.0–100.0)
MONO ABS: 0.6 10*3/uL (ref 0.1–1.0)
Monocytes Relative: 4 %
Neutro Abs: 13.3 10*3/uL — ABNORMAL HIGH (ref 1.7–7.7)
Neutrophils Relative %: 86 %
PLATELETS: 270 10*3/uL (ref 150–400)
RBC: 4.79 MIL/uL (ref 4.22–5.81)
RDW: 13.9 % (ref 11.5–15.5)
WBC: 15.4 10*3/uL — AB (ref 4.0–10.5)

## 2017-08-11 LAB — RESPIRATORY PANEL BY PCR
Adenovirus: NOT DETECTED
BORDETELLA PERTUSSIS-RVPCR: NOT DETECTED
CORONAVIRUS 229E-RVPPCR: NOT DETECTED
Chlamydophila pneumoniae: NOT DETECTED
Coronavirus HKU1: NOT DETECTED
Coronavirus NL63: NOT DETECTED
Coronavirus OC43: NOT DETECTED
INFLUENZA A-RVPPCR: NOT DETECTED
INFLUENZA B-RVPPCR: NOT DETECTED
METAPNEUMOVIRUS-RVPPCR: NOT DETECTED
MYCOPLASMA PNEUMONIAE-RVPPCR: NOT DETECTED
PARAINFLUENZA VIRUS 2-RVPPCR: NOT DETECTED
PARAINFLUENZA VIRUS 4-RVPPCR: NOT DETECTED
Parainfluenza Virus 1: NOT DETECTED
Parainfluenza Virus 3: NOT DETECTED
Respiratory Syncytial Virus: NOT DETECTED
Rhinovirus / Enterovirus: NOT DETECTED

## 2017-08-11 LAB — TROPONIN I: Troponin I: <0.03 ng/mL (ref <0.03)

## 2017-08-11 LAB — OSMOLALITY: Osmolality: 285 mOsm/kg (ref 275–295)

## 2017-08-11 LAB — SODIUM, URINE, RANDOM

## 2017-08-11 LAB — CORTISOL: CORTISOL PLASMA: 2.1 ug/dL

## 2017-08-11 LAB — CREATININE, SERUM
CREATININE: 1.8 mg/dL — AB (ref 0.61–1.24)
GFR calc Af Amer: 46 mL/min — ABNORMAL LOW (ref >60)
GFR calc non Af Amer: 40 mL/min — ABNORMAL LOW (ref >60)

## 2017-08-11 LAB — OSMOLALITY, URINE: OSMOLALITY UR: 93 mosm/kg — AB (ref 300–900)

## 2017-08-11 MED ORDER — AZITHROMYCIN 250 MG PO TABS
500.0000 mg | ORAL_TABLET | Freq: Every day | ORAL | Status: AC
  Administered 2017-08-11 – 2017-08-13 (×3): 500 mg via ORAL
  Filled 2017-08-11 (×3): qty 2

## 2017-08-11 MED ORDER — POLYETHYLENE GLYCOL 3350 17 G PO PACK
17.0000 g | PACK | Freq: Every day | ORAL | Status: DC
  Administered 2017-08-11 – 2017-08-14 (×3): 17 g via ORAL
  Filled 2017-08-11 (×3): qty 1

## 2017-08-11 MED ORDER — BENZTROPINE MESYLATE 0.5 MG PO TABS
0.5000 mg | ORAL_TABLET | Freq: Every morning | ORAL | Status: DC
  Administered 2017-08-11 – 2017-08-14 (×4): 0.5 mg via ORAL
  Filled 2017-08-11 (×4): qty 1

## 2017-08-11 MED ORDER — ASPIRIN EC 81 MG PO TBEC
81.0000 mg | DELAYED_RELEASE_TABLET | Freq: Every day | ORAL | Status: DC
  Administered 2017-08-11 – 2017-08-14 (×4): 81 mg via ORAL
  Filled 2017-08-11 (×6): qty 1

## 2017-08-11 MED ORDER — BISACODYL 10 MG RE SUPP: 10.0000 mg | Freq: Every day | RECTAL | Status: DC | PRN

## 2017-08-11 MED ORDER — IOPAMIDOL (ISOVUE-300) INJECTION 61%
INTRAVENOUS | Status: AC
  Filled 2017-08-11: qty 30

## 2017-08-11 MED ORDER — ALBUTEROL SULFATE (2.5 MG/3ML) 0.083% IN NEBU: 2.5000 mg | INHALATION_SOLUTION | Freq: Four times a day (QID) | RESPIRATORY_TRACT | Status: DC | PRN

## 2017-08-11 MED ORDER — SIMVASTATIN 20 MG PO TABS
20.0000 mg | ORAL_TABLET | Freq: Every day | ORAL | Status: DC
  Administered 2017-08-11 – 2017-08-13 (×3): 20 mg via ORAL
  Filled 2017-08-11 (×3): qty 1
  Filled 2017-08-11: qty 2

## 2017-08-11 MED ORDER — FINASTERIDE 5 MG PO TABS
10.0000 mg | ORAL_TABLET | Freq: Two times a day (BID) | ORAL | Status: DC
  Administered 2017-08-11 – 2017-08-14 (×7): 10 mg via ORAL
  Filled 2017-08-11 (×8): qty 2

## 2017-08-11 MED ORDER — VITAMIN D (ERGOCALCIFEROL) 1.25 MG (50000 UNIT) PO CAPS: 50000.0000 [IU] | ORAL_CAPSULE | ORAL | Status: DC

## 2017-08-11 MED ORDER — IOPAMIDOL (ISOVUE-300) INJECTION 61%
15.0000 mL | Freq: Once | INTRAVENOUS | Status: AC | PRN
  Administered 2017-08-11: 15 mL via ORAL

## 2017-08-11 MED ORDER — ALBUTEROL SULFATE (2.5 MG/3ML) 0.083% IN NEBU: 2.5000 mg | INHALATION_SOLUTION | RESPIRATORY_TRACT | Status: DC | PRN

## 2017-08-11 MED ORDER — CARIPRAZINE HCL 1.5 MG PO CAPS
4.5000 mg | ORAL_CAPSULE | Freq: Every day | ORAL | Status: DC
  Administered 2017-08-12 – 2017-08-14 (×3): 4.5 mg via ORAL
  Filled 2017-08-11 (×6): qty 3

## 2017-08-11 MED ORDER — METHYLPREDNISOLONE SODIUM SUCC 125 MG IJ SOLR
80.0000 mg | Freq: Three times a day (TID) | INTRAMUSCULAR | Status: DC
  Administered 2017-08-11 – 2017-08-12 (×5): 80 mg via INTRAVENOUS
  Filled 2017-08-11 (×5): qty 2

## 2017-08-11 MED ORDER — DEXTROSE 5 % IV SOLN
1.0000 g | INTRAVENOUS | Status: DC
  Administered 2017-08-11 – 2017-08-13 (×3): 1 g via INTRAVENOUS
  Filled 2017-08-11 (×3): qty 10

## 2017-08-11 MED ORDER — GUAIFENESIN-DM 100-10 MG/5ML PO SYRP
5.0000 mL | ORAL_SOLUTION | ORAL | Status: DC | PRN
  Administered 2017-08-11 – 2017-08-13 (×2): 5 mL via ORAL
  Filled 2017-08-11 (×2): qty 10

## 2017-08-11 MED ORDER — SODIUM CHLORIDE 0.9 % IV SOLN
1250.0000 mg | INTRAVENOUS | Status: DC
  Filled 2017-08-11: qty 1250

## 2017-08-11 MED ORDER — PIPERACILLIN-TAZOBACTAM 3.375 G IVPB
3.3750 g | INTRAVENOUS | Status: AC
  Administered 2017-08-11: 3.375 g via INTRAVENOUS
  Filled 2017-08-11: qty 50

## 2017-08-11 MED ORDER — ENOXAPARIN SODIUM 40 MG/0.4ML ~~LOC~~ SOLN
40.0000 mg | SUBCUTANEOUS | Status: DC
  Administered 2017-08-11 – 2017-08-13 (×3): 40 mg via SUBCUTANEOUS
  Filled 2017-08-11 (×3): qty 0.4

## 2017-08-11 MED ORDER — CLOZAPINE 100 MG PO TABS
600.0000 mg | ORAL_TABLET | Freq: Every day | ORAL | Status: DC
  Administered 2017-08-11 – 2017-08-13 (×3): 600 mg via ORAL
  Filled 2017-08-11 (×5): qty 6

## 2017-08-11 MED ORDER — VANCOMYCIN HCL 10 G IV SOLR
2000.0000 mg | INTRAVENOUS | Status: AC
  Administered 2017-08-11: 2000 mg via INTRAVENOUS
  Filled 2017-08-11: qty 2000

## 2017-08-11 MED ORDER — CARIPRAZINE HCL 4.5 MG PO CAPS
1.0000 | ORAL_CAPSULE | Freq: Every morning | ORAL | Status: DC
Start: 2017-08-11 — End: 2017-08-11

## 2017-08-11 MED ORDER — BETHANECHOL CHLORIDE 10 MG PO TABS
10.0000 mg | ORAL_TABLET | Freq: Two times a day (BID) | ORAL | Status: DC
  Administered 2017-08-11 – 2017-08-14 (×6): 10 mg via ORAL
  Filled 2017-08-11 (×8): qty 1

## 2017-08-11 MED ORDER — ALBUTEROL SULFATE (2.5 MG/3ML) 0.083% IN NEBU
2.5000 mg | INHALATION_SOLUTION | Freq: Four times a day (QID) | RESPIRATORY_TRACT | Status: DC
  Administered 2017-08-11 (×2): 2.5 mg via RESPIRATORY_TRACT
  Filled 2017-08-11 (×2): qty 3

## 2017-08-11 MED ORDER — IPRATROPIUM-ALBUTEROL 0.5-2.5 (3) MG/3ML IN SOLN
3.0000 mL | Freq: Four times a day (QID) | RESPIRATORY_TRACT | Status: DC
  Administered 2017-08-11 – 2017-08-13 (×10): 3 mL via RESPIRATORY_TRACT
  Filled 2017-08-11 (×10): qty 3

## 2017-08-11 MED ORDER — DOCUSATE SODIUM 100 MG PO CAPS
100.0000 mg | ORAL_CAPSULE | Freq: Two times a day (BID) | ORAL | Status: DC
  Administered 2017-08-11 – 2017-08-14 (×5): 100 mg via ORAL
  Filled 2017-08-11 (×5): qty 1

## 2017-08-11 MED ORDER — TEMAZEPAM 15 MG PO CAPS
15.0000 mg | ORAL_CAPSULE | Freq: Every day | ORAL | Status: DC
  Administered 2017-08-11 – 2017-08-13 (×3): 15 mg via ORAL
  Filled 2017-08-11 (×3): qty 1

## 2017-08-11 MED ORDER — SENNA 8.6 MG PO TABS
2.0000 | ORAL_TABLET | Freq: Every day | ORAL | Status: DC
Start: 2017-08-11 — End: 2017-08-14
  Administered 2017-08-11 – 2017-08-13 (×3): 17.2 mg via ORAL
  Filled 2017-08-11 (×3): qty 2

## 2017-08-11 MED ORDER — PIPERACILLIN-TAZOBACTAM 3.375 G IVPB
3.3750 g | Freq: Three times a day (TID) | INTRAVENOUS | Status: DC
  Administered 2017-08-11 (×2): 3.375 g via INTRAVENOUS
  Filled 2017-08-11 (×2): qty 50

## 2017-08-11 MED ORDER — TAMSULOSIN HCL 0.4 MG PO CAPS
0.4000 mg | ORAL_CAPSULE | Freq: Every day | ORAL | Status: DC
  Administered 2017-08-11 – 2017-08-14 (×5): 0.4 mg via ORAL
  Filled 2017-08-11 (×5): qty 1

## 2017-08-11 NOTE — Progress Notes (Signed)
Patient to transfer to 1418 report given to receiving nurses, all questions answered at this time.  VSS with no s/s of distress noted.  Patient stable at transfer.

## 2017-08-11 NOTE — ED Notes (Signed)
Patient transported to CT 

## 2017-08-11 NOTE — Progress Notes (Signed)
PROGRESS NOTE  Edwin [name]  HEN:[id number] DOB: 1956-06-20 DOA: 08/10/2017 PCP: Beaulah Corin, [facility]  Brief Narrative:   Patient is a 62 year old male with paranoid schizophrenia, hyperlipidemia, chronic kidney disease stage III, prostate cancer who presents with several days of cough and congestion.  He was seen in the urgent care center about 4 days prior to admission and was started on antibiotics, however he continued to have worsening shortness of breath and today he was so weak that he fell down and was unable to get back up.  In the emergency department, his white blood cell count was 23,000 he was hypothermic to the low 90s and chest x-ray demonstrated low lung volumes with vascular crowding basilar atelectasis but no obvious infiltrates or effusions, however, given his symptoms to suspect that he has community-acquired pneumonia.  He underwent CT of the chest, abdomen and pelvis in the ER and was confirmed to have pneumonia.  He was placed in a bear hugger, ceftriaxone and azithromycin and is being admitted for further treatment.  Assessment & Plan:  Sepsis (leukocytosis, hypothermia, tachycardia, tachypnea, hypotension, altered mental status) due to community-acquired pneumonia.  Sepsis physiology resolving.  White blood cell count is trending down, temperature is more stable and he is always very hungry, tachycardia, blood pressure and altered mental status improving.  He is still tachypneic. -Discontinue vancomycin and Zosyn -Start ceftriaxone and azithromycin -Respiratory viral panel negative -  F/u blood cultures  Acute respiratory failure due to acute COPD exacerbation Continue solumedrol 80mg  iv q8h Continue duonebs with albuterol prn  Hyponatremia likely due to dehydration and pneumonia, mild and asymptomatic -IV fluids and repeat BMP in a.m.  Chronic kidney disease stage III, creatinine at baseline of 1.7-1.8  -Continue IV fluids  Chronic bilateral hydronephrosis  with urinary outflow obstruction due to BPH -  Still making some urine  -  Continue flomax, finasteride, and bethanechol  ABnormal liver function, AST was barely above normal limits likely due to dehydration, repeat CMP tomorrow.    Schizoaffective disorder, stable, continue home medications -  cariprazine -  clozapine -  Temazepam -  Benztropine  Constipation noted on CT -  Start miralax, senna, colace and prn bisacodyl  DVT prophylaxis:  lovenox Code Status:  Full code Family Communication:  Patient alone Disposition Plan:  Anticipate discharge home in a few days   Consultants:   none  Procedures:  none  Antimicrobials:  Anti-infectives (From admission, onward)   Start     Dose/Rate Route Frequency Ordered Stop   08/12/17 0200  vancomycin (VANCOCIN) 1,250 mg in sodium chloride 0.9 % 250 mL IVPB     1,250 mg 166.7 mL/hr over 90 Minutes Intravenous Every 24 hours 08/11/17 0137     08/11/17 0800  piperacillin-tazobactam (ZOSYN) IVPB 3.375 g     3.375 g 12.5 mL/hr over 240 Minutes Intravenous Every 8 hours 08/11/17 0135     08/11/17 0200  piperacillin-tazobactam (ZOSYN) IVPB 3.375 g     3.375 g 12.5 mL/hr over 240 Minutes Intravenous STAT 08/11/17 0135 08/11/17 0306   08/11/17 0200  vancomycin (VANCOCIN) 2,000 mg in sodium chloride 0.9 % 500 mL IVPB     2,000 mg 250 mL/hr over 120 Minutes Intravenous STAT 08/11/17 0137 08/11/17 0433   08/10/17 2130  cefTRIAXone (ROCEPHIN) 1 g in dextrose 5 % 50 mL IVPB     1 g 100 mL/hr over 30 Minutes Intravenous  Once 08/10/17 2120 08/10/17 2306   08/10/17 2130  azithromycin (ZITHROMAX) 500  mg in dextrose 5 % 250 mL IVPB     500 mg 250 mL/hr over 60 Minutes Intravenous  Once 08/10/17 2120 08/11/17 0026       Subjective:  Still feeling SOB and wheezing and coughing a lot, but overall feeling more alert than he was this morning.  Currently not having chills or feeling feverish.  Denies nausea, vomiting, and diarrhea.  Feels  constipated.    Objective: Vitals:   08/11/17 1610 08/11/17 1651 08/11/17 1810 08/11/17 1945  BP:   102/64   Pulse: 92  92   Resp: (!) 24 (!) 22 18   Temp:   97.9 F (36.6 C)   TempSrc:   Oral   SpO2: 92% 93% 97% 92%  Weight:      Height:        Intake/Output Summary (Last 24 hours) at 08/11/2017 2022 Last data filed at 08/11/2017 0021 Gross per 24 hour  Intake -  Output 575 ml  Net -575 ml   Filed Weights   08/11/17 0122  Weight: 88 kg (194 lb)    Examination:  General exam:  Adult male, mild respiratory distress with tachypnea and wheezes with prolonged expiratory phase  HEENT:  NCAT, MMM Respiratory system:  Course rales and rhonchi throughout with wheezing and prolonged expiratory phase Cardiovascular system: Regular rate and rhythm, normal S1/S2. No murmurs, rubs, gallops or clicks.  Warm extremities Gastrointestinal system: Normal active bowel sounds, soft, nondistended, nontender. MSK:  Normal tone and bulk, no lower extremity edema Neuro:  Grossly intact    Data Reviewed: I have personally reviewed following labs and imaging studies  CBC: Recent Labs  Lab 08/10/17 1946 08/11/17 0627  WBC 23.5* 15.4*  NEUTROABS  --  13.3*  HGB 15.4 13.5  HCT 43.8 39.4  MCV 81.9 82.3  PLT 281 762   Basic Metabolic Panel: Recent Labs  Lab 08/10/17 1946 08/11/17 0407  NA 130*  --   K 3.9  --   CL 97*  --   CO2 23  --   GLUCOSE 86  --   BUN 24*  --   CREATININE 1.68* 1.80*  CALCIUM 9.0  --    GFR: Estimated Creatinine Clearance: 47.1 mL/min (A) (by C-G formula based on SCr of 1.8 mg/dL (H)). Liver Function Tests: Recent Labs  Lab 08/10/17 1946  AST 43*  ALT 36  ALKPHOS 118  BILITOT 0.5  PROT 7.1  ALBUMIN 3.5   No results for input(s): LIPASE, AMYLASE in the last 168 hours. No results for input(s): AMMONIA in the last 168 hours. Coagulation Profile: No results for input(s): INR, PROTIME in the last 168 hours. Cardiac Enzymes: Recent Labs  Lab  08/10/17 2221 08/11/17 0407 08/11/17 0958  TROPONINI <0.03 <0.03 <0.03   BNP (last 3 results) No results for input(s): PROBNP in the last 8760 hours. HbA1C: No results for input(s): HGBA1C in the last 72 hours. CBG: Recent Labs  Lab 08/10/17 1921  GLUCAP 90   Lipid Profile: No results for input(s): CHOL, HDL, LDLCALC, TRIG, CHOLHDL, LDLDIRECT in the last 72 hours. Thyroid Function Tests: Recent Labs    08/10/17 1946  TSH 1.054   Anemia Panel: No results for input(s): VITAMINB12, FOLATE, FERRITIN, TIBC, IRON, RETICCTPCT in the last 72 hours. Urine analysis:    Component Value Date/Time   COLORURINE STRAW (A) 08/10/2017 2147   APPEARANCEUR CLEAR 08/10/2017 2147   LABSPEC 1.002 (L) 08/10/2017 2147   PHURINE 6.0 08/10/2017 2147   GLUCOSEU NEGATIVE  08/10/2017 2147   HGBUR SMALL (A) 08/10/2017 2147   BILIRUBINUR NEGATIVE 08/10/2017 2147   KETONESUR NEGATIVE 08/10/2017 2147   PROTEINUR NEGATIVE 08/10/2017 2147   UROBILINOGEN 0.2 04/14/2015 1447   NITRITE NEGATIVE 08/10/2017 2147   LEUKOCYTESUR TRACE (A) 08/10/2017 2147   Sepsis Labs: @LABRCNTIP (procalcitonin:4,lacticidven:4)  ) Recent Results (from the past 240 hour(s))  Respiratory Panel by PCR     Status: None   Collection Time: 08/11/17  1:30 AM  Result Value Ref Range Status   Adenovirus NOT DETECTED NOT DETECTED Final   Coronavirus 229E NOT DETECTED NOT DETECTED Final   Coronavirus HKU1 NOT DETECTED NOT DETECTED Final   Coronavirus NL63 NOT DETECTED NOT DETECTED Final   Coronavirus OC43 NOT DETECTED NOT DETECTED Final   Metapneumovirus NOT DETECTED NOT DETECTED Final   Rhinovirus / Enterovirus NOT DETECTED NOT DETECTED Final   Influenza A NOT DETECTED NOT DETECTED Final   Influenza B NOT DETECTED NOT DETECTED Final   Parainfluenza Virus 1 NOT DETECTED NOT DETECTED Final   Parainfluenza Virus 2 NOT DETECTED NOT DETECTED Final   Parainfluenza Virus 3 NOT DETECTED NOT DETECTED Final   Parainfluenza Virus 4 NOT  DETECTED NOT DETECTED Final   Respiratory Syncytial Virus NOT DETECTED NOT DETECTED Final   Bordetella pertussis NOT DETECTED NOT DETECTED Final   Chlamydophila pneumoniae NOT DETECTED NOT DETECTED Final   Mycoplasma pneumoniae NOT DETECTED NOT DETECTED Final    Comment: Performed at [facility], [place]. [address], [place], [place] [address]      Radiology Studies: Ct Abdomen Pelvis Wo Contrast  Result Date: 08/11/2017 CLINICAL DATA:  62 year old male with abnormal liver function tests. EXAM: CT CHEST, ABDOMEN AND PELVIS WITHOUT CONTRAST TECHNIQUE: Multidetector CT imaging of the chest, abdomen and pelvis was performed following the standard protocol without IV contrast. COMPARISON:  Renal ultrasound dated 06/10/2015 and CT of the abdomen pelvis dated 02/18/2015 FINDINGS: Evaluation of this exam is limited in the absence of intravenous contrast. Evaluation is also limited due to respiratory motion artifact. CT CHEST FINDINGS Cardiovascular: There is no cardiomegaly or pericardial effusion. The thoracic aorta and central pulmonary arteries are unremarkable on this noncontrast CT. Mediastinum/Nodes: There is no hilar or mediastinal adenopathy. The esophagus and the thyroid gland are grossly unremarkable. No mediastinal fluid collection. Lungs/Pleura: Patchy area of ground-glass and nodular density in the left lung base most consistent with pneumonia. Aspiration is not excluded. Clinical correlation recommended. Scattered smaller areas of ground-glass densities noted in the upper lobes anteriorly. A trace right pleural effusion may be present. There is no pneumothorax. The central airways are patent. Musculoskeletal: There is degenerative changes of the spine. No acute osseous pathology. CT ABDOMEN PELVIS FINDINGS No intra-abdominal free air. Trace free fluid in the right posterior pelvis. Hepatobiliary: No focal liver abnormality is seen. No gallstones, gallbladder wall thickening, or biliary  dilatation. Pancreas: Unremarkable. No pancreatic ductal dilatation or surrounding inflammatory changes. Spleen: Normal in size without focal abnormality. Adrenals/Urinary Tract: The adrenal glands are unremarkable. Chronic severe bilateral hydronephrosis as well as severely distended bilateral extrarenal pelvis. A punctate focus of parenchymal calcification noted in the upper pole of the left kidney similar to prior CT. The ureters are dilated and tortuous similar to prior CT which may be related to chronic obstruction at the level of the UVJ or reflux. The urinary bladder is distended. There is slight trabeculated appearance of the bladder wall sequela of bladder outlet obstruction. The thickened bladder wall seen on the prior CT has  been resolved. Stomach/Bowel: Large amount of dense stool noted throughout the colon. There is no evidence of bowel obstruction or active inflammation. The appendix is normal a metallic density is noted adjacent to the appendix. Vascular/Lymphatic: Mild aortoiliac atherosclerotic disease. The abdominal aorta and IVC are otherwise grossly unremarkable on this noncontrast CT. No portal venous gas. There is no adenopathy. Reproductive: Postsurgical changes of TURP. Other: Small left anterior abdominal wall lipoma. Musculoskeletal: Degenerative changes of the spine. Bilateral L5 pars defects. No listhesis. No acute osseous pathology. IMPRESSION: 1. Patchy ground-glass density at the left lung base most consistent with pneumonia. Clinical correlation and follow-up recommended. Aspiration is not excluded. Smaller areas of ground-glass density noted in the anterior upper lobes bilaterally. 2. Chronic severe bilateral hydronephroureter similar to the prior CT. Mild trabeculated appearance of the bladder wall likely sequela of chronic bladder outlet obstruction. Interval resolution of the previously seen thickened bladder wall. 3. Postsurgical changes of TURP. 4. Constipation.  No bowel  obstruction.  Normal appendix. Electronically Signed   By: Anner Crete M.D.   On: 08/11/2017 01:52   Dg Chest 2 View  Result Date: 08/10/2017 CLINICAL DATA:  One week history of cough and dizziness. EXAM: CHEST  2 VIEW COMPARISON:  01/15/2017 FINDINGS: The cardiac silhouette, mediastinal and hilar contours are within normal limits. Low lung volumes with vascular crowding and streaky basilar atelectasis. No definite infiltrates or effusions. No pneumothorax. The bony thorax is intact. IMPRESSION: Low lung volumes with vascular crowding and streaky basilar atelectasis. No definite infiltrates or effusions. Electronically Signed   By: Marijo Sanes M.D.   On: 08/10/2017 19:14   Ct Head Wo Contrast  Result Date: 08/10/2017 CLINICAL DATA:  62 year old male with altered mental status. EXAM: CT HEAD WITHOUT CONTRAST TECHNIQUE: Contiguous axial images were obtained from the base of the skull through the vertex without intravenous contrast. COMPARISON:  Head CT dated 03/31/2015 FINDINGS: Evaluation of this exam is limited due to motion artifact. Brain: The ventricles and sulci appropriate size for patient's age. The gray-white matter discrimination is preserved. There is no acute intracranial hemorrhage. No mass effect or midline shift. No extra-axial fluid collection. Vascular: No hyperdense vessel or unexpected calcification. Skull: Normal. Negative for fracture or focal lesion. Sinuses/Orbits: No acute finding. Other: None IMPRESSION: Unremarkable noncontrast CT of the brain. Electronically Signed   By: Anner Crete M.D.   On: 08/10/2017 22:25   Ct Chest Wo Contrast  Result Date: 08/11/2017 CLINICAL DATA:  63 year old male with abnormal liver function tests. EXAM: CT CHEST, ABDOMEN AND PELVIS WITHOUT CONTRAST TECHNIQUE: Multidetector CT imaging of the chest, abdomen and pelvis was performed following the standard protocol without IV contrast. COMPARISON:  Renal ultrasound dated 06/10/2015 and CT of  the abdomen pelvis dated 02/18/2015 FINDINGS: Evaluation of this exam is limited in the absence of intravenous contrast. Evaluation is also limited due to respiratory motion artifact. CT CHEST FINDINGS Cardiovascular: There is no cardiomegaly or pericardial effusion. The thoracic aorta and central pulmonary arteries are unremarkable on this noncontrast CT. Mediastinum/Nodes: There is no hilar or mediastinal adenopathy. The esophagus and the thyroid gland are grossly unremarkable. No mediastinal fluid collection. Lungs/Pleura: Patchy area of ground-glass and nodular density in the left lung base most consistent with pneumonia. Aspiration is not excluded. Clinical correlation recommended. Scattered smaller areas of ground-glass densities noted in the upper lobes anteriorly. A trace right pleural effusion may be present. There is no pneumothorax. The central airways are patent. Musculoskeletal: There is degenerative changes of the  spine. No acute osseous pathology. CT ABDOMEN PELVIS FINDINGS No intra-abdominal free air. Trace free fluid in the right posterior pelvis. Hepatobiliary: No focal liver abnormality is seen. No gallstones, gallbladder wall thickening, or biliary dilatation. Pancreas: Unremarkable. No pancreatic ductal dilatation or surrounding inflammatory changes. Spleen: Normal in size without focal abnormality. Adrenals/Urinary Tract: The adrenal glands are unremarkable. Chronic severe bilateral hydronephrosis as well as severely distended bilateral extrarenal pelvis. A punctate focus of parenchymal calcification noted in the upper pole of the left kidney similar to prior CT. The ureters are dilated and tortuous similar to prior CT which may be related to chronic obstruction at the level of the UVJ or reflux. The urinary bladder is distended. There is slight trabeculated appearance of the bladder wall sequela of bladder outlet obstruction. The thickened bladder wall seen on the prior CT has been resolved.  Stomach/Bowel: Large amount of dense stool noted throughout the colon. There is no evidence of bowel obstruction or active inflammation. The appendix is normal a metallic density is noted adjacent to the appendix. Vascular/Lymphatic: Mild aortoiliac atherosclerotic disease. The abdominal aorta and IVC are otherwise grossly unremarkable on this noncontrast CT. No portal venous gas. There is no adenopathy. Reproductive: Postsurgical changes of TURP. Other: Small left anterior abdominal wall lipoma. Musculoskeletal: Degenerative changes of the spine. Bilateral L5 pars defects. No listhesis. No acute osseous pathology. IMPRESSION: 1. Patchy ground-glass density at the left lung base most consistent with pneumonia. Clinical correlation and follow-up recommended. Aspiration is not excluded. Smaller areas of ground-glass density noted in the anterior upper lobes bilaterally. 2. Chronic severe bilateral hydronephroureter similar to the prior CT. Mild trabeculated appearance of the bladder wall likely sequela of chronic bladder outlet obstruction. Interval resolution of the previously seen thickened bladder wall. 3. Postsurgical changes of TURP. 4. Constipation.  No bowel obstruction.  Normal appendix. Electronically Signed   By: Anner Crete M.D.   On: 08/11/2017 01:52     Scheduled Meds: . aspirin EC  81 mg Oral Daily  . benztropine  0.5 mg Oral q morning - 10a  . bethanechol  10 mg Oral BID  . cariprazine  4.5 mg Oral Daily  . clozapine  600 mg Oral QHS  . finasteride  10 mg Oral BID  . ipratropium-albuterol  3 mL Nebulization Q6H  . methylPREDNISolone (SOLU-MEDROL) injection  80 mg Intravenous Q8H  . simvastatin  20 mg Oral q1800  . tamsulosin  0.4 mg Oral Daily  . temazepam  15 mg Oral QHS  . [START ON 08/16/2017] Vitamin D (Ergocalciferol)  50,000 Units Oral Weekly   Continuous Infusions: . sodium chloride 125 mL/hr at 08/11/17 1829  . piperacillin-tazobactam (ZOSYN)  IV 3.375 g (08/11/17 1636)    . [START ON 08/12/2017] vancomycin       LOS: 1 day    Time spent: 30 min    Janece Canterbury, MD [facility] Pager [phone]  If 7PM-7AM, please contact night-coverage [email or web address] Password [facility] 08/11/2017, 8:22 PM

## 2017-08-11 NOTE — Progress Notes (Signed)
Pharmacy Antibiotic Note  Edwin [name] is a 62 y.o. male admitted on 08/10/2017 with sepsis.  Pharmacy has been consulted for Vancomycin and Zosyn dosing.  Plan: Vancomycin 2g IV x 1, then 1250mg  IV q24h. Plan for Vancomycin peak/trough levels at steady state. Zosyn 3.375g IV x 1 over 30 minutes, then Zosyn 3.375g IV q8h (infuse over 4 hours). Monitor renal function, cultures, clinical course.   Height: 5\' 11"  (180.3 cm) Weight: 194 lb (88 kg) IBW/kg (Calculated) : 75.3  Temp (24hrs), Avg:94.6 F (34.8 C), Min:92.7 F (33.7 C), Max:97.1 F (36.2 C)  Recent Labs  Lab 08/10/17 1946 08/10/17 2001  WBC 23.5*  --   CREATININE 1.68*  --   LATICACIDVEN  --  0.83    Estimated Creatinine Clearance: 50.4 mL/min (A) (by C-G formula based on SCr of 1.68 mg/dL (H)).    No Known Allergies  Antimicrobials this admission: 1/12 Ceftriaxone x 1 1/12 Azithromycin x 1 1/13 Zosyn >> 1/13 Vancomycin >>  Dose adjustments this admission: --  Microbiology results: 1/12 BCx: sent 1/13 Respiratory panel by PCR: sent  Thank you for allowing pharmacy to be a part of this patient's care.   Lindell Spar, PharmD, BCPS Pager: [phone] 08/11/2017 1:42 AM

## 2017-08-11 NOTE — Progress Notes (Signed)
Pharmacy: ceftriaxone and lovenox  Patient is a a 62 y.o M presented to the ED on 08/10/17 with c/o cough and recent fall. Head CT with no acute findings. She was started on vancomycin and zosyn on admission for suspected sepsis and PNA.  To change abx to azithromycin and ceftriaxone on 1/13 for PNA and start lovenox for VTE prophylaxis.  - scr up 1.80 (crcl~47) -  CBC ok  Plan: - azithromycin 500 mg daily per MD - ceftriaxone 1gm IV q24h (pharmacy will sign off for abx since no renal adjustment is needed with ceftriaxone) - lovenox 40 mg SQ q24h ( will follow peripherally and adjust if crcl<30)   Dia Sitter, PharmD, BCPS 08/11/2017 8:47 PM

## 2017-08-11 NOTE — ED Notes (Signed)
ED TO INPATIENT HANDOFF REPORT  Name/Age/Gender Edwin [name] 62 y.o. male  Code Status Code Status History    Date Active Date Inactive Code Status Order ID Comments User Context   07/09/2016 15:39 07/10/2016 17:37 Full Code [id number]  Cleon Gustin, MD Inpatient   06/27/2015 16:21 06/28/2015 15:55 Full Code [id number]  Cleon Gustin, MD Inpatient   06/10/2015 14:37 06/17/2015 20:58 Full Code [id number]  Cleon Gustin, MD Inpatient   04/14/2015 18:59 04/18/2015 17:02 Full Code [id number]  Elmarie Shiley, MD Inpatient   03/31/2015 17:54 04/07/2015 18:55 Full Code [id number]  Elmarie Shiley, MD Inpatient   01/13/2015 20:14 01/20/2015 22:38 Full Code [id number]  Etta Quill, DO Inpatient      Home/SNF/Other Home  Chief Complaint Shortness of Breath Coughing  Fainting  Loss of Voice   Level of Care/Admitting Diagnosis ED Disposition    ED Disposition Condition [facility] Area: [place] [[id number]]  Level of Care: Stepdown [14]  Admit to SDU based on following criteria: Hemodynamic compromise or significant risk of instability:  Patient requiring short term acute titration and management of vasoactive drips, and invasive monitoring (i.e., CVP and Arterial line).  Diagnosis: Hypothermia [536644]  Admitting Physician: Jani Gravel [[id number]]  Attending Physician: Jani Gravel [phone]  Estimated length of stay: past midnight tomorrow  Certification:: I certify this patient will need inpatient services for at least 2 midnights  PT Class (Do Not Modify): Inpatient [101]  PT Acc Code (Do Not Modify): Private [1]       Medical History Past Medical History:  Diagnosis Date  . Anxiety   . Cancer [facility])    prostate  . CKD (chronic kidney disease) stage 3, GFR 30-59 ml/min (HCC)   . Depression   . GERD (gastroesophageal reflux disease)    under control  . History of Clostridium difficile colitis    03-31-2015--  resolved  . History of  sepsis    secondary to UTI and ARF  x2 admission 03-31-2015 &  04-14-2015  . Hyperlipidemia   . Paranoid schizophrenia ([place])   . Urinary incontinence     Allergies No Known Allergies  IV Location/Drains/Wounds Patient Lines/Drains/Airways Status   Active Line/Drains/Airways    Name:   Placement date:   Placement time:   Site:   Days:   Peripheral IV 08/10/17 Left Antecubital   08/10/17    1930    Antecubital   1   Peripheral IV 08/10/17 Right;Anterior Forearm   08/10/17    1930    Forearm   1   External Urinary Catheter   08/10/17    2254    -   1          Labs/Imaging Results for orders placed or performed during the hospital encounter of 08/10/17 (from the past 48 hour(s))  CBG monitoring, ED     Status: None   Collection Time: 08/10/17  7:21 PM  Result Value Ref Range   Glucose-Capillary 90 65 - 99 mg/dL  CBC     Status: Abnormal   Collection Time: 08/10/17  7:46 PM  Result Value Ref Range   WBC 23.5 (H) 4.0 - 10.5 K/uL   RBC 5.35 4.22 - 5.81 MIL/uL   Hemoglobin 15.4 13.0 - 17.0 g/dL   HCT 43.8 39.0 - 52.0 %   MCV 81.9 78.0 - 100.0 fL   MCH 28.8 26.0 - 34.0 pg   MCHC 35.2 30.0 - 36.0  g/dL   RDW 13.9 11.5 - 15.5 %   Platelets 281 150 - 400 K/uL  Comprehensive metabolic panel     Status: Abnormal   Collection Time: 08/10/17  7:46 PM  Result Value Ref Range   Sodium 130 (L) 135 - 145 mmol/L   Potassium 3.9 3.5 - 5.1 mmol/L   Chloride 97 (L) 101 - 111 mmol/L   CO2 23 22 - 32 mmol/L   Glucose, Bld 86 65 - 99 mg/dL   BUN 24 (H) 6 - 20 mg/dL   Creatinine, Ser 1.68 (H) 0.61 - 1.24 mg/dL   Calcium 9.0 8.9 - 10.3 mg/dL   Total Protein 7.1 6.5 - 8.1 g/dL   Albumin 3.5 3.5 - 5.0 g/dL   AST 43 (H) 15 - 41 U/L   ALT 36 17 - 63 U/L   Alkaline Phosphatase 118 38 - 126 U/L   Total Bilirubin 0.5 0.3 - 1.2 mg/dL   GFR calc non Af Amer 43 (L) >60 mL/min   GFR calc Af Amer 50 (L) >60 mL/min    Comment: (NOTE) The eGFR has been calculated using the CKD EPI equation. This  calculation has not been validated in all clinical situations. eGFR's persistently <60 mL/min signify possible Chronic Kidney Disease.    Anion gap 10 5 - 15  TSH     Status: None   Collection Time: 08/10/17  7:46 PM  Result Value Ref Range   TSH 1.054 0.350 - 4.500 uIU/mL    Comment: Performed by a 3rd Generation assay with a functional sensitivity of <=0.01 uIU/mL.  I-stat troponin, ED     Status: None   Collection Time: 08/10/17  7:59 PM  Result Value Ref Range   Troponin i, poc 0.00 0.00 - 0.08 ng/mL   Comment 3            Comment: Due to the release kinetics of cTnI, a negative result within the first hours of the onset of symptoms does not rule out myocardial infarction with certainty. If myocardial infarction is still suspected, repeat the test at appropriate intervals.   I-Stat CG4 Lactic Acid, ED  (not at  [facility])     Status: None   Collection Time: 08/10/17  8:01 PM  Result Value Ref Range   Lactic Acid, Venous 0.83 0.5 - 1.9 mmol/L  Urinalysis, Routine w reflex microscopic     Status: Abnormal   Collection Time: 08/10/17  9:47 PM  Result Value Ref Range   Color, Urine STRAW (A) YELLOW   APPearance CLEAR CLEAR   Specific Gravity, Urine 1.002 (L) 1.005 - 1.030   pH 6.0 5.0 - 8.0   Glucose, UA NEGATIVE NEGATIVE mg/dL   Hgb urine dipstick SMALL (A) NEGATIVE   Bilirubin Urine NEGATIVE NEGATIVE   Ketones, ur NEGATIVE NEGATIVE mg/dL   Protein, ur NEGATIVE NEGATIVE mg/dL   Nitrite NEGATIVE NEGATIVE   Leukocytes, UA TRACE (A) NEGATIVE   RBC / HPF 0-5 0 - 5 RBC/hpf   WBC, UA 0-5 0 - 5 WBC/hpf   Bacteria, UA NONE SEEN NONE SEEN   Squamous Epithelial / LPF NONE SEEN NONE SEEN  Troponin I (q 6hr x 3)     Status: None   Collection Time: 08/10/17 10:21 PM  Result Value Ref Range   Troponin I <0.03 <0.03 ng/mL  D-dimer, quantitative (not at [facility])     Status: Abnormal   Collection Time: 08/10/17 10:21 PM  Result Value Ref Range   D-Dimer, [place] Brown  0.55 (H) 0.00 - 0.50  ug/mL-FEU    Comment: (NOTE) At the manufacturer cut-off of 0.50 ug/mL FEU, this assay has been documented to exclude PE with a sensitivity and negative predictive value of 97 to 99%.  At this time, this assay has not been approved by the FDA to exclude DVT/VTE. Results should be correlated with clinical presentation.   Sodium, urine, random     Status: None   Collection Time: 08/11/17 12:04 AM  Result Value Ref Range   Sodium, Ur <10 mmol/L    Comment: Performed at [facility] [address], [place], [place] [address]  Osmolality, urine     Status: Abnormal   Collection Time: 08/11/17 12:04 AM  Result Value Ref Range   Osmolality, Ur 93 (L) 300 - 900 mOsm/kg    Comment: Performed at [facility] [address], [place], [place] [address]  Respiratory Panel by PCR     Status: None   Collection Time: 08/11/17  1:30 AM  Result Value Ref Range   Adenovirus NOT DETECTED NOT DETECTED   Coronavirus 229E NOT DETECTED NOT DETECTED   Coronavirus HKU1 NOT DETECTED NOT DETECTED   Coronavirus NL63 NOT DETECTED NOT DETECTED   Coronavirus OC43 NOT DETECTED NOT DETECTED   Metapneumovirus NOT DETECTED NOT DETECTED   Rhinovirus / Enterovirus NOT DETECTED NOT DETECTED   Influenza A NOT DETECTED NOT DETECTED   Influenza B NOT DETECTED NOT DETECTED   Parainfluenza Virus 1 NOT DETECTED NOT DETECTED   Parainfluenza Virus 2 NOT DETECTED NOT DETECTED   Parainfluenza Virus 3 NOT DETECTED NOT DETECTED   Parainfluenza Virus 4 NOT DETECTED NOT DETECTED   Respiratory Syncytial Virus NOT DETECTED NOT DETECTED   Bordetella pertussis NOT DETECTED NOT DETECTED   Chlamydophila pneumoniae NOT DETECTED NOT DETECTED   Mycoplasma pneumoniae NOT DETECTED NOT DETECTED    Comment: Performed at [facility], [place] [address], [place], [place] [address]  Troponin I (q 6hr x 3)     Status: None   Collection Time: 08/11/17  4:07 AM  Result Value Ref Range   Troponin I <0.03 <0.03 ng/mL  Cortisol      Status: None   Collection Time: 08/11/17  4:07 AM  Result Value Ref Range   Cortisol, Plasma 2.1 ug/dL    Comment: (NOTE) AM    6.7 - 22.6 ug/dL PM   <10.0       ug/dL Performed at [facility] [address], [place], [place] [address]   Osmolality     Status: None   Collection Time: 08/11/17  4:07 AM  Result Value Ref Range   Osmolality 285 275 - 295 mOsm/kg    Comment: Performed at [facility] [address], [place], [place] [address]  Creatinine, serum     Status: Abnormal   Collection Time: 08/11/17  4:07 AM  Result Value Ref Range   Creatinine, Ser 1.80 (H) 0.61 - 1.24 mg/dL   GFR calc non Af Amer 40 (L) >60 mL/min   GFR calc Af Amer 46 (L) >60 mL/min    Comment: (NOTE) The eGFR has been calculated using the CKD EPI equation. This calculation has not been validated in all clinical situations. eGFR's persistently <60 mL/min signify possible Chronic Kidney Disease.   CBC with Differential/Platelet     Status: Abnormal   Collection Time: 08/11/17  6:27 AM  Result Value Ref Range   WBC 15.4 (H) 4.0 -  10.5 K/uL   RBC 4.79 4.22 - 5.81 MIL/uL   Hemoglobin 13.5 13.0 - 17.0 g/dL   HCT 39.4 39.0 - 52.0 %   MCV 82.3 78.0 - 100.0 fL   MCH 28.2 26.0 - 34.0 pg   MCHC 34.3 30.0 - 36.0 g/dL   RDW 13.9 11.5 - 15.5 %   Platelets 270 150 - 400 K/uL   Neutrophils Relative % 86 %   Neutro Abs 13.3 (H) 1.7 - 7.7 K/uL   Lymphocytes Relative 10 %   Lymphs Abs 1.5 0.7 - 4.0 K/uL   Monocytes Relative 4 %   Monocytes Absolute 0.6 0.1 - 1.0 K/uL   Eosinophils Relative 0 %   Eosinophils Absolute 0.0 0.0 - 0.7 K/uL   Basophils Relative 0 %   Basophils Absolute 0.0 0.0 - 0.1 K/uL  Troponin I (q 6hr x 3)     Status: None   Collection Time: 08/11/17  9:58 AM  Result Value Ref Range   Troponin I <0.03 <0.03 ng/mL   Ct Abdomen Pelvis Wo Contrast  Result Date: 08/11/2017 CLINICAL DATA:  62 year old male with abnormal liver function tests. EXAM: CT CHEST, ABDOMEN AND PELVIS  WITHOUT CONTRAST TECHNIQUE: Multidetector CT imaging of the chest, abdomen and pelvis was performed following the standard protocol without IV contrast. COMPARISON:  Renal ultrasound dated 06/10/2015 and CT of the abdomen pelvis dated 02/18/2015 FINDINGS: Evaluation of this exam is limited in the absence of intravenous contrast. Evaluation is also limited due to respiratory motion artifact. CT CHEST FINDINGS Cardiovascular: There is no cardiomegaly or pericardial effusion. The thoracic aorta and central pulmonary arteries are unremarkable on this noncontrast CT. Mediastinum/Nodes: There is no hilar or mediastinal adenopathy. The esophagus and the thyroid gland are grossly unremarkable. No mediastinal fluid collection. Lungs/Pleura: Patchy area of ground-glass and nodular density in the left lung base most consistent with pneumonia. Aspiration is not excluded. Clinical correlation recommended. Scattered smaller areas of ground-glass densities noted in the upper lobes anteriorly. A trace right pleural effusion may be present. There is no pneumothorax. The central airways are patent. Musculoskeletal: There is degenerative changes of the spine. No acute osseous pathology. CT ABDOMEN PELVIS FINDINGS No intra-abdominal free air. Trace free fluid in the right posterior pelvis. Hepatobiliary: No focal liver abnormality is seen. No gallstones, gallbladder wall thickening, or biliary dilatation. Pancreas: Unremarkable. No pancreatic ductal dilatation or surrounding inflammatory changes. Spleen: Normal in size without focal abnormality. Adrenals/Urinary Tract: The adrenal glands are unremarkable. Chronic severe bilateral hydronephrosis as well as severely distended bilateral extrarenal pelvis. A punctate focus of parenchymal calcification noted in the upper pole of the left kidney similar to prior CT. The ureters are dilated and tortuous similar to prior CT which may be related to chronic obstruction at the level of the UVJ or  reflux. The urinary bladder is distended. There is slight trabeculated appearance of the bladder wall sequela of bladder outlet obstruction. The thickened bladder wall seen on the prior CT has been resolved. Stomach/Bowel: Large amount of dense stool noted throughout the colon. There is no evidence of bowel obstruction or active inflammation. The appendix is normal a metallic density is noted adjacent to the appendix. Vascular/Lymphatic: Mild aortoiliac atherosclerotic disease. The abdominal aorta and IVC are otherwise grossly unremarkable on this noncontrast CT. No portal venous gas. There is no adenopathy. Reproductive: Postsurgical changes of TURP. Other: Small left anterior abdominal wall lipoma. Musculoskeletal: Degenerative changes of the spine. Bilateral L5 pars defects. No listhesis. No acute osseous  pathology. IMPRESSION: 1. Patchy ground-glass density at the left lung base most consistent with pneumonia. Clinical correlation and follow-up recommended. Aspiration is not excluded. Smaller areas of ground-glass density noted in the anterior upper lobes bilaterally. 2. Chronic severe bilateral hydronephroureter similar to the prior CT. Mild trabeculated appearance of the bladder wall likely sequela of chronic bladder outlet obstruction. Interval resolution of the previously seen thickened bladder wall. 3. Postsurgical changes of TURP. 4. Constipation.  No bowel obstruction.  Normal appendix. Electronically Signed   By: Anner Crete M.D.   On: 08/11/2017 01:52   Dg Chest 2 View  Result Date: 08/10/2017 CLINICAL DATA:  One week history of cough and dizziness. EXAM: CHEST  2 VIEW COMPARISON:  01/15/2017 FINDINGS: The cardiac silhouette, mediastinal and hilar contours are within normal limits. Low lung volumes with vascular crowding and streaky basilar atelectasis. No definite infiltrates or effusions. No pneumothorax. The bony thorax is intact. IMPRESSION: Low lung volumes with vascular crowding and  streaky basilar atelectasis. No definite infiltrates or effusions. Electronically Signed   By: Marijo Sanes M.D.   On: 08/10/2017 19:14   Ct Head Wo Contrast  Result Date: 08/10/2017 CLINICAL DATA:  62 year old male with altered mental status. EXAM: CT HEAD WITHOUT CONTRAST TECHNIQUE: Contiguous axial images were obtained from the base of the skull through the vertex without intravenous contrast. COMPARISON:  Head CT dated 03/31/2015 FINDINGS: Evaluation of this exam is limited due to motion artifact. Brain: The ventricles and sulci appropriate size for patient's age. The gray-white matter discrimination is preserved. There is no acute intracranial hemorrhage. No mass effect or midline shift. No extra-axial fluid collection. Vascular: No hyperdense vessel or unexpected calcification. Skull: Normal. Negative for fracture or focal lesion. Sinuses/Orbits: No acute finding. Other: None IMPRESSION: Unremarkable noncontrast CT of the brain. Electronically Signed   By: Anner Crete M.D.   On: 08/10/2017 22:25   Ct Chest Wo Contrast  Result Date: 08/11/2017 CLINICAL DATA:  62 year old male with abnormal liver function tests. EXAM: CT CHEST, ABDOMEN AND PELVIS WITHOUT CONTRAST TECHNIQUE: Multidetector CT imaging of the chest, abdomen and pelvis was performed following the standard protocol without IV contrast. COMPARISON:  Renal ultrasound dated 06/10/2015 and CT of the abdomen pelvis dated 02/18/2015 FINDINGS: Evaluation of this exam is limited in the absence of intravenous contrast. Evaluation is also limited due to respiratory motion artifact. CT CHEST FINDINGS Cardiovascular: There is no cardiomegaly or pericardial effusion. The thoracic aorta and central pulmonary arteries are unremarkable on this noncontrast CT. Mediastinum/Nodes: There is no hilar or mediastinal adenopathy. The esophagus and the thyroid gland are grossly unremarkable. No mediastinal fluid collection. Lungs/Pleura: Patchy area of  ground-glass and nodular density in the left lung base most consistent with pneumonia. Aspiration is not excluded. Clinical correlation recommended. Scattered smaller areas of ground-glass densities noted in the upper lobes anteriorly. A trace right pleural effusion may be present. There is no pneumothorax. The central airways are patent. Musculoskeletal: There is degenerative changes of the spine. No acute osseous pathology. CT ABDOMEN PELVIS FINDINGS No intra-abdominal free air. Trace free fluid in the right posterior pelvis. Hepatobiliary: No focal liver abnormality is seen. No gallstones, gallbladder wall thickening, or biliary dilatation. Pancreas: Unremarkable. No pancreatic ductal dilatation or surrounding inflammatory changes. Spleen: Normal in size without focal abnormality. Adrenals/Urinary Tract: The adrenal glands are unremarkable. Chronic severe bilateral hydronephrosis as well as severely distended bilateral extrarenal pelvis. A punctate focus of parenchymal calcification noted in the upper pole of the left kidney similar  to prior CT. The ureters are dilated and tortuous similar to prior CT which may be related to chronic obstruction at the level of the UVJ or reflux. The urinary bladder is distended. There is slight trabeculated appearance of the bladder wall sequela of bladder outlet obstruction. The thickened bladder wall seen on the prior CT has been resolved. Stomach/Bowel: Large amount of dense stool noted throughout the colon. There is no evidence of bowel obstruction or active inflammation. The appendix is normal a metallic density is noted adjacent to the appendix. Vascular/Lymphatic: Mild aortoiliac atherosclerotic disease. The abdominal aorta and IVC are otherwise grossly unremarkable on this noncontrast CT. No portal venous gas. There is no adenopathy. Reproductive: Postsurgical changes of TURP. Other: Small left anterior abdominal wall lipoma. Musculoskeletal: Degenerative changes of the  spine. Bilateral L5 pars defects. No listhesis. No acute osseous pathology. IMPRESSION: 1. Patchy ground-glass density at the left lung base most consistent with pneumonia. Clinical correlation and follow-up recommended. Aspiration is not excluded. Smaller areas of ground-glass density noted in the anterior upper lobes bilaterally. 2. Chronic severe bilateral hydronephroureter similar to the prior CT. Mild trabeculated appearance of the bladder wall likely sequela of chronic bladder outlet obstruction. Interval resolution of the previously seen thickened bladder wall. 3. Postsurgical changes of TURP. 4. Constipation.  No bowel obstruction.  Normal appendix. Electronically Signed   By: Anner Crete M.D.   On: 08/11/2017 01:52    Pending Labs Unresulted Labs (From admission, onward)   Start     Ordered   08/11/17 0500  Creatinine, serum  Daily,   R     08/11/17 0142   08/10/17 1917  Blood Culture (routine x 2)  BLOOD CULTURE X 2,   STAT     08/10/17 1917      Vitals/Pain Today's Vitals   08/11/17 1015 08/11/17 1100 08/11/17 1324 08/11/17 1325  BP: 110/62 116/71 117/81   Pulse: (!) 114 89 (!) 105   Resp: 18 (!) 25 (!) 27   Temp: 98 F (36.7 C)  (!) 97.1 F (36.2 C)   TempSrc: Oral  Oral   SpO2: 97% 100% 94%   Weight:      Height:      PainSc:    0-No pain    Isolation Precautions Droplet precaution  Medications Medications  0.9 %  sodium chloride infusion ( Intravenous New Bag/Given 08/10/17 2034)  aspirin EC tablet 81 mg (not administered)  benztropine (COGENTIN) tablet 0.5 mg (not administered)  bethanechol (URECHOLINE) tablet 10 mg (10 mg Oral Not Given 08/11/17 0112)  cloZAPine (CLOZARIL) tablet 600 mg (600 mg Oral Not Given 08/11/17 0126)  finasteride (PROSCAR) tablet 10 mg (10 mg Oral Not Given 08/11/17 0112)  simvastatin (ZOCOR) tablet 20 mg (not administered)  tamsulosin (FLOMAX) capsule 0.4 mg (0.4 mg Oral Given 08/11/17 1014)  temazepam (RESTORIL) capsule 15 mg (15 mg  Oral Not Given 08/11/17 0112)  methylPREDNISolone sodium succinate (SOLU-MEDROL) 125 mg/2 mL injection 80 mg (80 mg Intravenous Given 08/11/17 0834)  piperacillin-tazobactam (ZOSYN) IVPB 3.375 g (3.375 g Intravenous New Bag/Given 08/11/17 0834)  vancomycin (VANCOCIN) 1,250 mg in sodium chloride 0.9 % 250 mL IVPB (not administered)  Vitamin D (Ergocalciferol) (DRISDOL) capsule 50,000 Units (not administered)  ipratropium-albuterol (DUONEB) 0.5-2.5 (3) MG/3ML nebulizer solution 3 mL (not administered)  albuterol (PROVENTIL) (2.5 MG/3ML) 0.083% nebulizer solution 2.5 mg (not administered)  cariprazine (VRAYLAR) capsule 4.5 mg (not administered)  sodium chloride 0.9 % bolus 1,000 mL (0 mLs Intravenous Stopped 08/10/17 2146)  cefTRIAXone (ROCEPHIN) 1 g in dextrose 5 % 50 mL IVPB (0 g Intravenous Stopped 08/10/17 2306)  azithromycin (ZITHROMAX) 500 mg in dextrose 5 % 250 mL IVPB (0 mg Intravenous Stopped 08/11/17 0026)  iopamidol (ISOVUE-300) 61 % injection 15 mL ( Oral Canceled Entry 08/11/17 0959)  piperacillin-tazobactam (ZOSYN) IVPB 3.375 g (0 g Intravenous Stopped 08/11/17 0306)  vancomycin (VANCOCIN) 2,000 mg in sodium chloride 0.9 % 500 mL IVPB (0 mg Intravenous Stopped 08/11/17 0433)    Mobility walks

## 2017-08-11 NOTE — ED Notes (Signed)
Patient pulled out his right arm IV. This RN placed patient in new gown and changed pt's blankets and bed pads. This RN placed a new dressing on patient's left arm IV and wrapped it in gauze. Pt made aware of importance of left arm IV. Will make primary RN aware.

## 2017-08-11 NOTE — ED Notes (Signed)
Bed: JG94 Expected date:  Expected time:  Means of arrival:  Comments: Res b

## 2017-08-11 NOTE — ED Notes (Signed)
ED TO INPATIENT HANDOFF REPORT  Name/Age/Gender Edwin [name] 62 y.o. male  Code Status Code Status History    Date Active Date Inactive Code Status Order ID Comments User Context   07/09/2016 15:39 07/10/2016 17:37 Full Code [id number]  Cleon Gustin, MD Inpatient   06/27/2015 16:21 06/28/2015 15:55 Full Code [id number]  Cleon Gustin, MD Inpatient   06/10/2015 14:37 06/17/2015 20:58 Full Code [id number]  Cleon Gustin, MD Inpatient   04/14/2015 18:59 04/18/2015 17:02 Full Code [id number]  Elmarie Shiley, MD Inpatient   03/31/2015 17:54 04/07/2015 18:55 Full Code [id number]  Elmarie Shiley, MD Inpatient   01/13/2015 20:14 01/20/2015 22:38 Full Code [id number]  Etta Quill, DO Inpatient      Home/SNF/Other Home  Chief Complaint Shortness of Breath/ Coughing / Fainting / Loss of Voice   Level of Care/Admitting Diagnosis ED Disposition    ED Disposition Condition [facility] Area: [place] [[id number]]  Level of Care: Stepdown [14]  Admit to SDU based on following criteria: Hemodynamic compromise or significant risk of instability:  Patient requiring short term acute titration and management of vasoactive drips, and invasive monitoring (i.e., CVP and Arterial line).  Diagnosis: Hypothermia [703500]  Admitting Physician: Jani Gravel [[id number]]  Attending Physician: Jani Gravel [phone]  Estimated length of stay: past midnight tomorrow  Certification:: I certify this patient will need inpatient services for at least 2 midnights  PT Class (Do Not Modify): Inpatient [101]  PT Acc Code (Do Not Modify): Private [1]       Medical History Past Medical History:  Diagnosis Date  . Anxiety   . Cancer [facility])    prostate  . CKD (chronic kidney disease) stage 3, GFR 30-59 ml/min (HCC)   . Depression   . GERD (gastroesophageal reflux disease)    under control  . History of Clostridium difficile colitis    03-31-2015--  resolved  . History  of sepsis    secondary to UTI and ARF  x2 admission 03-31-2015 &  04-14-2015  . Hyperlipidemia   . Paranoid schizophrenia ([place])   . Urinary incontinence     Allergies No Known Allergies  IV Location/Drains/Wounds Patient Lines/Drains/Airways Status   Active Line/Drains/Airways    Name:   Placement date:   Placement time:   Site:   Days:   Peripheral IV 08/10/17 Left Antecubital   08/10/17    1930    Antecubital   1   Peripheral IV 08/10/17 Right;Anterior Forearm   08/10/17    1930    Forearm   1   Urethral Catheter McKenzie Latex 22 Fr.   07/09/16    1315    Latex   398   External Urinary Catheter   08/10/17    2254    -   1   Incision (Closed) 07/09/16 Penis Other (Comment)   07/09/16    1301     398          Labs/Imaging Results for orders placed or performed during the hospital encounter of 08/10/17 (from the past 48 hour(s))  CBG monitoring, ED     Status: None   Collection Time: 08/10/17  7:21 PM  Result Value Ref Range   Glucose-Capillary 90 65 - 99 mg/dL  CBC     Status: Abnormal   Collection Time: 08/10/17  7:46 PM  Result Value Ref Range   WBC 23.5 (H) 4.0 - 10.5 K/uL   RBC 5.35 4.22 -  5.81 MIL/uL   Hemoglobin 15.4 13.0 - 17.0 g/dL   HCT 43.8 39.0 - 52.0 %   MCV 81.9 78.0 - 100.0 fL   MCH 28.8 26.0 - 34.0 pg   MCHC 35.2 30.0 - 36.0 g/dL   RDW 13.9 11.5 - 15.5 %   Platelets 281 150 - 400 K/uL  Comprehensive metabolic panel     Status: Abnormal   Collection Time: 08/10/17  7:46 PM  Result Value Ref Range   Sodium 130 (L) 135 - 145 mmol/L   Potassium 3.9 3.5 - 5.1 mmol/L   Chloride 97 (L) 101 - 111 mmol/L   CO2 23 22 - 32 mmol/L   Glucose, Bld 86 65 - 99 mg/dL   BUN 24 (H) 6 - 20 mg/dL   Creatinine, Ser 1.68 (H) 0.61 - 1.24 mg/dL   Calcium 9.0 8.9 - 10.3 mg/dL   Total Protein 7.1 6.5 - 8.1 g/dL   Albumin 3.5 3.5 - 5.0 g/dL   AST 43 (H) 15 - 41 U/L   ALT 36 17 - 63 U/L   Alkaline Phosphatase 118 38 - 126 U/L   Total Bilirubin 0.5 0.3 - 1.2 mg/dL   GFR  calc non Af Amer 43 (L) >60 mL/min   GFR calc Af Amer 50 (L) >60 mL/min    Comment: (NOTE) The eGFR has been calculated using the CKD EPI equation. This calculation has not been validated in all clinical situations. eGFR's persistently <60 mL/min signify possible Chronic Kidney Disease.    Anion gap 10 5 - 15  TSH     Status: None   Collection Time: 08/10/17  7:46 PM  Result Value Ref Range   TSH 1.054 0.350 - 4.500 uIU/mL    Comment: Performed by a 3rd Generation assay with a functional sensitivity of <=0.01 uIU/mL.  I-stat troponin, ED     Status: None   Collection Time: 08/10/17  7:59 PM  Result Value Ref Range   Troponin i, poc 0.00 0.00 - 0.08 ng/mL   Comment 3            Comment: Due to the release kinetics of cTnI, a negative result within the first hours of the onset of symptoms does not rule out myocardial infarction with certainty. If myocardial infarction is still suspected, repeat the test at appropriate intervals.   I-Stat CG4 Lactic Acid, ED  (not at  [facility])     Status: None   Collection Time: 08/10/17  8:01 PM  Result Value Ref Range   Lactic Acid, Venous 0.83 0.5 - 1.9 mmol/L  Urinalysis, Routine w reflex microscopic     Status: Abnormal   Collection Time: 08/10/17  9:47 PM  Result Value Ref Range   Color, Urine STRAW (A) YELLOW   APPearance CLEAR CLEAR   Specific Gravity, Urine 1.002 (L) 1.005 - 1.030   pH 6.0 5.0 - 8.0   Glucose, UA NEGATIVE NEGATIVE mg/dL   Hgb urine dipstick SMALL (A) NEGATIVE   Bilirubin Urine NEGATIVE NEGATIVE   Ketones, ur NEGATIVE NEGATIVE mg/dL   Protein, ur NEGATIVE NEGATIVE mg/dL   Nitrite NEGATIVE NEGATIVE   Leukocytes, UA TRACE (A) NEGATIVE   RBC / HPF 0-5 0 - 5 RBC/hpf   WBC, UA 0-5 0 - 5 WBC/hpf   Bacteria, UA NONE SEEN NONE SEEN   Squamous Epithelial / LPF NONE SEEN NONE SEEN  Troponin I (q 6hr x 3)     Status: None   Collection Time: 08/10/17 10:21  PM  Result Value Ref Range   Troponin I <0.03 <0.03 ng/mL  D-dimer,  quantitative (not at [facility])     Status: Abnormal   Collection Time: 08/10/17 10:21 PM  Result Value Ref Range   D-Dimer, Quant 0.55 (H) 0.00 - 0.50 ug/mL-FEU    Comment: (NOTE) At the manufacturer cut-off of 0.50 ug/mL FEU, this assay has been documented to exclude PE with a sensitivity and negative predictive value of 97 to 99%.  At this time, this assay has not been approved by the FDA to exclude DVT/VTE. Results should be correlated with clinical presentation.   Troponin I (q 6hr x 3)     Status: None   Collection Time: 08/11/17  4:07 AM  Result Value Ref Range   Troponin I <0.03 <0.03 ng/mL  Creatinine, serum     Status: Abnormal   Collection Time: 08/11/17  4:07 AM  Result Value Ref Range   Creatinine, Ser 1.80 (H) 0.61 - 1.24 mg/dL   GFR calc non Af Amer 40 (L) >60 mL/min   GFR calc Af Amer 46 (L) >60 mL/min    Comment: (NOTE) The eGFR has been calculated using the CKD EPI equation. This calculation has not been validated in all clinical situations. eGFR's persistently <60 mL/min signify possible Chronic Kidney Disease.   CBC with Differential/Platelet     Status: Abnormal   Collection Time: 08/11/17  6:27 AM  Result Value Ref Range   WBC 15.4 (H) 4.0 - 10.5 K/uL   RBC 4.79 4.22 - 5.81 MIL/uL   Hemoglobin 13.5 13.0 - 17.0 g/dL   HCT 39.4 39.0 - 52.0 %   MCV 82.3 78.0 - 100.0 fL   MCH 28.2 26.0 - 34.0 pg   MCHC 34.3 30.0 - 36.0 g/dL   RDW 13.9 11.5 - 15.5 %   Platelets 270 150 - 400 K/uL   Neutrophils Relative % 86 %   Neutro Abs 13.3 (H) 1.7 - 7.7 K/uL   Lymphocytes Relative 10 %   Lymphs Abs 1.5 0.7 - 4.0 K/uL   Monocytes Relative 4 %   Monocytes Absolute 0.6 0.1 - 1.0 K/uL   Eosinophils Relative 0 %   Eosinophils Absolute 0.0 0.0 - 0.7 K/uL   Basophils Relative 0 %   Basophils Absolute 0.0 0.0 - 0.1 K/uL   Ct Abdomen Pelvis Wo Contrast  Result Date: 08/11/2017 CLINICAL DATA:  62 year old male with abnormal liver function tests. EXAM: CT CHEST, ABDOMEN AND  PELVIS WITHOUT CONTRAST TECHNIQUE: Multidetector CT imaging of the chest, abdomen and pelvis was performed following the standard protocol without IV contrast. COMPARISON:  Renal ultrasound dated 06/10/2015 and CT of the abdomen pelvis dated 02/18/2015 FINDINGS: Evaluation of this exam is limited in the absence of intravenous contrast. Evaluation is also limited due to respiratory motion artifact. CT CHEST FINDINGS Cardiovascular: There is no cardiomegaly or pericardial effusion. The thoracic aorta and central pulmonary arteries are unremarkable on this noncontrast CT. Mediastinum/Nodes: There is no hilar or mediastinal adenopathy. The esophagus and the thyroid gland are grossly unremarkable. No mediastinal fluid collection. Lungs/Pleura: Patchy area of ground-glass and nodular density in the left lung base most consistent with pneumonia. Aspiration is not excluded. Clinical correlation recommended. Scattered smaller areas of ground-glass densities noted in the upper lobes anteriorly. A trace right pleural effusion may be present. There is no pneumothorax. The central airways are patent. Musculoskeletal: There is degenerative changes of the spine. No acute osseous pathology. CT ABDOMEN PELVIS FINDINGS No intra-abdominal free  air. Trace free fluid in the right posterior pelvis. Hepatobiliary: No focal liver abnormality is seen. No gallstones, gallbladder wall thickening, or biliary dilatation. Pancreas: Unremarkable. No pancreatic ductal dilatation or surrounding inflammatory changes. Spleen: Normal in size without focal abnormality. Adrenals/Urinary Tract: The adrenal glands are unremarkable. Chronic severe bilateral hydronephrosis as well as severely distended bilateral extrarenal pelvis. A punctate focus of parenchymal calcification noted in the upper pole of the left kidney similar to prior CT. The ureters are dilated and tortuous similar to prior CT which may be related to chronic obstruction at the level of the  UVJ or reflux. The urinary bladder is distended. There is slight trabeculated appearance of the bladder wall sequela of bladder outlet obstruction. The thickened bladder wall seen on the prior CT has been resolved. Stomach/Bowel: Large amount of dense stool noted throughout the colon. There is no evidence of bowel obstruction or active inflammation. The appendix is normal a metallic density is noted adjacent to the appendix. Vascular/Lymphatic: Mild aortoiliac atherosclerotic disease. The abdominal aorta and IVC are otherwise grossly unremarkable on this noncontrast CT. No portal venous gas. There is no adenopathy. Reproductive: Postsurgical changes of TURP. Other: Small left anterior abdominal wall lipoma. Musculoskeletal: Degenerative changes of the spine. Bilateral L5 pars defects. No listhesis. No acute osseous pathology. IMPRESSION: 1. Patchy ground-glass density at the left lung base most consistent with pneumonia. Clinical correlation and follow-up recommended. Aspiration is not excluded. Smaller areas of ground-glass density noted in the anterior upper lobes bilaterally. 2. Chronic severe bilateral hydronephroureter similar to the prior CT. Mild trabeculated appearance of the bladder wall likely sequela of chronic bladder outlet obstruction. Interval resolution of the previously seen thickened bladder wall. 3. Postsurgical changes of TURP. 4. Constipation.  No bowel obstruction.  Normal appendix. Electronically Signed   By: Anner Crete M.D.   On: 08/11/2017 01:52   Dg Chest 2 View  Result Date: 08/10/2017 CLINICAL DATA:  One week history of cough and dizziness. EXAM: CHEST  2 VIEW COMPARISON:  01/15/2017 FINDINGS: The cardiac silhouette, mediastinal and hilar contours are within normal limits. Low lung volumes with vascular crowding and streaky basilar atelectasis. No definite infiltrates or effusions. No pneumothorax. The bony thorax is intact. IMPRESSION: Low lung volumes with vascular crowding  and streaky basilar atelectasis. No definite infiltrates or effusions. Electronically Signed   By: Marijo Sanes M.D.   On: 08/10/2017 19:14   Ct Head Wo Contrast  Result Date: 08/10/2017 CLINICAL DATA:  62 year old male with altered mental status. EXAM: CT HEAD WITHOUT CONTRAST TECHNIQUE: Contiguous axial images were obtained from the base of the skull through the vertex without intravenous contrast. COMPARISON:  Head CT dated 03/31/2015 FINDINGS: Evaluation of this exam is limited due to motion artifact. Brain: The ventricles and sulci appropriate size for patient's age. The gray-white matter discrimination is preserved. There is no acute intracranial hemorrhage. No mass effect or midline shift. No extra-axial fluid collection. Vascular: No hyperdense vessel or unexpected calcification. Skull: Normal. Negative for fracture or focal lesion. Sinuses/Orbits: No acute finding. Other: None IMPRESSION: Unremarkable noncontrast CT of the brain. Electronically Signed   By: Anner Crete M.D.   On: 08/10/2017 22:25   Ct Chest Wo Contrast  Result Date: 08/11/2017 CLINICAL DATA:  63 year old male with abnormal liver function tests. EXAM: CT CHEST, ABDOMEN AND PELVIS WITHOUT CONTRAST TECHNIQUE: Multidetector CT imaging of the chest, abdomen and pelvis was performed following the standard protocol without IV contrast. COMPARISON:  Renal ultrasound dated 06/10/2015 and CT  of the abdomen pelvis dated 02/18/2015 FINDINGS: Evaluation of this exam is limited in the absence of intravenous contrast. Evaluation is also limited due to respiratory motion artifact. CT CHEST FINDINGS Cardiovascular: There is no cardiomegaly or pericardial effusion. The thoracic aorta and central pulmonary arteries are unremarkable on this noncontrast CT. Mediastinum/Nodes: There is no hilar or mediastinal adenopathy. The esophagus and the thyroid gland are grossly unremarkable. No mediastinal fluid collection. Lungs/Pleura: Patchy area of  ground-glass and nodular density in the left lung base most consistent with pneumonia. Aspiration is not excluded. Clinical correlation recommended. Scattered smaller areas of ground-glass densities noted in the upper lobes anteriorly. A trace right pleural effusion may be present. There is no pneumothorax. The central airways are patent. Musculoskeletal: There is degenerative changes of the spine. No acute osseous pathology. CT ABDOMEN PELVIS FINDINGS No intra-abdominal free air. Trace free fluid in the right posterior pelvis. Hepatobiliary: No focal liver abnormality is seen. No gallstones, gallbladder wall thickening, or biliary dilatation. Pancreas: Unremarkable. No pancreatic ductal dilatation or surrounding inflammatory changes. Spleen: Normal in size without focal abnormality. Adrenals/Urinary Tract: The adrenal glands are unremarkable. Chronic severe bilateral hydronephrosis as well as severely distended bilateral extrarenal pelvis. A punctate focus of parenchymal calcification noted in the upper pole of the left kidney similar to prior CT. The ureters are dilated and tortuous similar to prior CT which may be related to chronic obstruction at the level of the UVJ or reflux. The urinary bladder is distended. There is slight trabeculated appearance of the bladder wall sequela of bladder outlet obstruction. The thickened bladder wall seen on the prior CT has been resolved. Stomach/Bowel: Large amount of dense stool noted throughout the colon. There is no evidence of bowel obstruction or active inflammation. The appendix is normal a metallic density is noted adjacent to the appendix. Vascular/Lymphatic: Mild aortoiliac atherosclerotic disease. The abdominal aorta and IVC are otherwise grossly unremarkable on this noncontrast CT. No portal venous gas. There is no adenopathy. Reproductive: Postsurgical changes of TURP. Other: Small left anterior abdominal wall lipoma. Musculoskeletal: Degenerative changes of the  spine. Bilateral L5 pars defects. No listhesis. No acute osseous pathology. IMPRESSION: 1. Patchy ground-glass density at the left lung base most consistent with pneumonia. Clinical correlation and follow-up recommended. Aspiration is not excluded. Smaller areas of ground-glass density noted in the anterior upper lobes bilaterally. 2. Chronic severe bilateral hydronephroureter similar to the prior CT. Mild trabeculated appearance of the bladder wall likely sequela of chronic bladder outlet obstruction. Interval resolution of the previously seen thickened bladder wall. 3. Postsurgical changes of TURP. 4. Constipation.  No bowel obstruction.  Normal appendix. Electronically Signed   By: Anner Crete M.D.   On: 08/11/2017 01:52    Pending Labs Unresulted Labs (From admission, onward)   Start     Ordered   08/11/17 0500  Creatinine, serum  Daily,   R     08/11/17 0142   08/11/17 0400  Cortisol  Once,   R     08/11/17 0051   08/11/17 0400  Osmolality  Once,   R     08/11/17 0051   08/11/17 0053  Respiratory Panel by PCR  (Respiratory virus panel)  Once,   R     08/11/17 0052   08/11/17 0004  Sodium, urine, random  Once,   R     08/11/17 0004   08/11/17 0004  Osmolality, urine  Once,   R     08/11/17 0004   08/10/17 2158  Troponin I (q 6hr x 3)  Now then every 6 hours,   R     08/10/17 2158   08/10/17 1917  Blood Culture (routine x 2)  BLOOD CULTURE X 2,   STAT     08/10/17 1917      Vitals/Pain Today's Vitals   08/11/17 0614 08/11/17 0623 08/11/17 0806 08/11/17 0808  BP: 92/68 111/60  104/63  Pulse: 92 96 90 77  Resp: (!) 28 (!) 26 (!) 25 (!) 22  Temp:  (!) 97.5 F (36.4 C)  97.8 F (36.6 C)  TempSrc:  Oral  Oral  SpO2: 97% 95% 95% 95%  Weight:      Height:      PainSc:    0-No pain    Isolation Precautions Droplet precaution  Medications Medications  0.9 %  sodium chloride infusion ( Intravenous New Bag/Given 08/10/17 2034)  aspirin EC tablet 81 mg (not administered)   benztropine (COGENTIN) tablet 0.5 mg (not administered)  bethanechol (URECHOLINE) tablet 10 mg (10 mg Oral Not Given 08/11/17 0112)  cloZAPine (CLOZARIL) tablet 600 mg (600 mg Oral Not Given 08/11/17 0126)  finasteride (PROSCAR) tablet 10 mg (10 mg Oral Not Given 08/11/17 0112)  simvastatin (ZOCOR) tablet 20 mg (not administered)  tamsulosin (FLOMAX) capsule 0.4 mg (not administered)  temazepam (RESTORIL) capsule 15 mg (15 mg Oral Not Given 08/11/17 0112)  Cariprazine HCl CAPS 4.5 mg (not administered)  methylPREDNISolone sodium succinate (SOLU-MEDROL) 125 mg/2 mL injection 80 mg (80 mg Intravenous Given 08/11/17 0834)  iopamidol (ISOVUE-300) 61 % injection (not administered)  piperacillin-tazobactam (ZOSYN) IVPB 3.375 g (3.375 g Intravenous New Bag/Given 08/11/17 0834)  vancomycin (VANCOCIN) 1,250 mg in sodium chloride 0.9 % 250 mL IVPB (not administered)  Vitamin D (Ergocalciferol) (DRISDOL) capsule 50,000 Units (not administered)  ipratropium-albuterol (DUONEB) 0.5-2.5 (3) MG/3ML nebulizer solution 3 mL (not administered)  albuterol (PROVENTIL) (2.5 MG/3ML) 0.083% nebulizer solution 2.5 mg (not administered)  sodium chloride 0.9 % bolus 1,000 mL (0 mLs Intravenous Stopped 08/10/17 2146)  cefTRIAXone (ROCEPHIN) 1 g in dextrose 5 % 50 mL IVPB (0 g Intravenous Stopped 08/10/17 2306)  azithromycin (ZITHROMAX) 500 mg in dextrose 5 % 250 mL IVPB (0 mg Intravenous Stopped 08/11/17 0026)  iopamidol (ISOVUE-300) 61 % injection 15 mL (15 mLs Oral Contrast Given 08/11/17 0106)  piperacillin-tazobactam (ZOSYN) IVPB 3.375 g (0 g Intravenous Stopped 08/11/17 0306)  vancomycin (VANCOCIN) 2,000 mg in sodium chloride 0.9 % 500 mL IVPB (0 mg Intravenous Stopped 08/11/17 0433)    Mobility walks

## 2017-08-12 ENCOUNTER — Other Ambulatory Visit: Payer: Self-pay

## 2017-08-12 LAB — BASIC METABOLIC PANEL
Anion gap: 6 (ref 5–15)
BUN: 26 mg/dL — AB (ref 6–20)
CO2: 24 mmol/L (ref 22–32)
CREATININE: 2.05 mg/dL — AB (ref 0.61–1.24)
Calcium: 8.5 mg/dL — ABNORMAL LOW (ref 8.9–10.3)
Chloride: 110 mmol/L (ref 101–111)
GFR calc Af Amer: 39 mL/min — ABNORMAL LOW (ref >60)
GFR calc non Af Amer: 34 mL/min — ABNORMAL LOW (ref >60)
Glucose, Bld: 127 mg/dL — ABNORMAL HIGH (ref 65–99)
POTASSIUM: 3.9 mmol/L (ref 3.5–5.1)
Sodium: 140 mmol/L (ref 135–145)

## 2017-08-12 LAB — CBC
HEMATOCRIT: 40.8 % (ref 39.0–52.0)
Hemoglobin: 13.7 g/dL (ref 13.0–17.0)
MCH: 28.1 pg (ref 26.0–34.0)
MCHC: 33.6 g/dL (ref 30.0–36.0)
MCV: 83.8 fL (ref 78.0–100.0)
PLATELETS: 278 10*3/uL (ref 150–400)
RBC: 4.87 MIL/uL (ref 4.22–5.81)
RDW: 14.2 % (ref 11.5–15.5)
WBC: 23.3 10*3/uL — AB (ref 4.0–10.5)

## 2017-08-12 MED ORDER — GUAIFENESIN ER 600 MG PO TB12
600.0000 mg | ORAL_TABLET | Freq: Two times a day (BID) | ORAL | Status: DC
  Administered 2017-08-12 – 2017-08-14 (×5): 600 mg via ORAL
  Filled 2017-08-12 (×5): qty 1

## 2017-08-12 MED ORDER — METHYLPREDNISOLONE SODIUM SUCC 125 MG IJ SOLR
60.0000 mg | Freq: Two times a day (BID) | INTRAMUSCULAR | Status: DC
  Administered 2017-08-12 – 2017-08-13 (×2): 60 mg via INTRAVENOUS
  Filled 2017-08-12 (×2): qty 2

## 2017-08-12 NOTE — Progress Notes (Signed)
Order received to bladder scan patient to assess for urinary retention. Bladder scan revealed 650cc of urine in the bladder. MD made aware of findings. Order received to place foley catheter for urinary retention. 16 Fr Coude catheter placed per MD orders and immediately drained 775cc of urine from the bladder.  Edwin [name] [facility]

## 2017-08-12 NOTE — Progress Notes (Signed)
Spoke with patient's brother, Edwin [name]. States the patient lives with his son and his son's girlfriend, but has to provide most care to himself. States that the patient does not have any developmental delays and can understand most of what is told to him. [name] states that the patient has had a foley catheter at home before but that he was in and out of the hospital three times with sepsis d/t UTI with the foley catheter. States that the patient does not do well with hygiene and keeping the catheter cleaned. [name] will be the patient's transport to home.  Othella Boyer [facility] 08/12/2017

## 2017-08-12 NOTE — Progress Notes (Signed)
PROGRESS NOTE  Edwin [name]  BJS:[id number] DOB: 01/09/1956 DOA: 08/10/2017 PCP: Beaulah Corin, [facility]  Brief Narrative:   Patient is a 62 year old male with paranoid schizophrenia, hyperlipidemia, chronic kidney disease stage III, prostate cancer who presents with several days of cough and congestion.  He was seen in the urgent care center about 4 days prior to admission and was started on antibiotics, however he continued to have worsening shortness of breath and today he was so weak that he fell down and was unable to get back up.  In the emergency department, his white blood cell count was 23,000 he was hypothermic to the low 90s and chest x-ray demonstrated low lung volumes with vascular crowding basilar atelectasis but no obvious infiltrates or effusions, however, given his symptoms to suspect that he has community-acquired pneumonia.  He underwent CT of the chest, abdomen and pelvis in the ER and was confirmed to have pneumonia.  He was placed in a bear hugger in the ER.  He was started on steroids and antibiotics and is starting to feel better.  Assessment & Plan:  Sepsis (leukocytosis, hypothermia, tachycardia, tachypnea, hypotension, altered mental status) due to community-acquired pneumonia.  Sepsis physiology resolved.  - Continue ceftriaxone and azithromycin -Respiratory viral panel negative -  F/u blood cultures  Acute respiratory failure due to acute COPD exacerbation Decrease solumedrol to 60mg  IV BID Continue duonebs with albuterol prn  Hyponatremia likely due to dehydration and pneumonia, resolved with IVF.  Chronic kidney disease stage III, creatinine at baseline of 1.7-1.8, creatinine up to 2 despite IVF.  May be having worsening obstruction secondary to duonebs -  PVR:  566mL retained urine -  Coude catheter placed -  continue IV fluids  Chronic bilateral hydronephrosis with urinary outflow obstruction due to BPH -  Coude catheter placed on 1/14 -  Continue  flomax, finasteride, and bethanechol  ABnormal liver function, AST was barely above normal limits likely due to dehydration -  LFTs in AM  Schizoaffective disorder, stable, continue home medications -  cariprazine -  clozapine -  Temazepam -  Benztropine  Constipation noted on CT, resolved  -  Continue miralax, senna, colace and prn bisacodyl  DVT prophylaxis:  lovenox Code Status:  Full code Family Communication:  Patient alone Disposition Plan:  Anticipate discharge home in a few days   Consultants:   none  Procedures:  none  Antimicrobials:  Anti-infectives (From admission, onward)   Start     Dose/Rate Route Frequency Ordered Stop   08/12/17 0200  vancomycin (VANCOCIN) 1,250 mg in sodium chloride 0.9 % 250 mL IVPB  Status:  Discontinued     1,250 mg 166.7 mL/hr over 90 Minutes Intravenous Every 24 hours 08/11/17 0137 08/11/17 2037   08/11/17 2300  cefTRIAXone (ROCEPHIN) 1 g in dextrose 5 % 50 mL IVPB     1 g 100 mL/hr over 30 Minutes Intravenous Every 24 hours 08/11/17 2051     08/11/17 2200  azithromycin (ZITHROMAX) tablet 500 mg     500 mg Oral Daily at bedtime 08/11/17 2037 08/14/17 2159   08/11/17 0800  piperacillin-tazobactam (ZOSYN) IVPB 3.375 g  Status:  Discontinued     3.375 g 12.5 mL/hr over 240 Minutes Intravenous Every 8 hours 08/11/17 0135 08/11/17 2037   08/11/17 0200  piperacillin-tazobactam (ZOSYN) IVPB 3.375 g     3.375 g 12.5 mL/hr over 240 Minutes Intravenous STAT 08/11/17 0135 08/11/17 0306   08/11/17 0200  vancomycin (VANCOCIN) 2,000 mg in sodium  chloride 0.9 % 500 mL IVPB     2,000 mg 250 mL/hr over 120 Minutes Intravenous STAT 08/11/17 0137 08/11/17 0433   08/10/17 2130  cefTRIAXone (ROCEPHIN) 1 g in dextrose 5 % 50 mL IVPB     1 g 100 mL/hr over 30 Minutes Intravenous  Once 08/10/17 2120 08/10/17 2306   08/10/17 2130  azithromycin (ZITHROMAX) 500 mg in dextrose 5 % 250 mL IVPB     500 mg 250 mL/hr over 60 Minutes Intravenous  Once  08/10/17 2120 08/11/17 0026       Subjective:  States his constipation has resolved.  He still feels wheezy and somewhat SOB but overall somewhat better.  EAting well.    Objective: Vitals:   08/12/17 0532 08/12/17 0816 08/12/17 1310 08/12/17 1405  BP: 110/61  105/69   Pulse: 97  83   Resp: 20  18   Temp: 98.6 F (37 C)  98.2 F (36.8 C)   TempSrc: Oral  Oral   SpO2: 97% 95% 97% 97%  Weight:      Height:        Intake/Output Summary (Last 24 hours) at 08/12/2017 1519 Last data filed at 08/12/2017 1258 Gross per 24 hour  Intake 652.92 ml  Output 2695 ml  Net -2042.08 ml   Filed Weights   08/11/17 0122  Weight: 88 kg (194 lb)    Examination:  General exam:  Adult male.  No acute distress.  HEENT:  NCAT, MMM Respiratory system: Scattered rales and wheezing throughout.  Better aeration today compared to yesterday. Cardiovascular system: Regular rate and rhythm, normal S1/S2. No murmurs, rubs, gallops or clicks.  Warm extremities Gastrointestinal system: Normal active bowel sounds, soft, nondistended, nontender. MSK:  Normal tone and bulk, no lower extremity edema Neuro:  Grossly intact  Data Reviewed: I have personally reviewed following labs and imaging studies  CBC: Recent Labs  Lab 08/10/17 1946 08/11/17 0627 08/12/17 0514  WBC 23.5* 15.4* 23.3*  NEUTROABS  --  13.3*  --   HGB 15.4 13.5 13.7  HCT 43.8 39.4 40.8  MCV 81.9 82.3 83.8  PLT 281 270 366   Basic Metabolic Panel: Recent Labs  Lab 08/10/17 1946 08/11/17 0407 08/12/17 0514  NA 130*  --  140  K 3.9  --  3.9  CL 97*  --  110  CO2 23  --  24  GLUCOSE 86  --  127*  BUN 24*  --  26*  CREATININE 1.68* 1.80* 2.05*  CALCIUM 9.0  --  8.5*   GFR: Estimated Creatinine Clearance: 41.3 mL/min (A) (by C-G formula based on SCr of 2.05 mg/dL (H)). Liver Function Tests: Recent Labs  Lab 08/10/17 1946  AST 43*  ALT 36  ALKPHOS 118  BILITOT 0.5  PROT 7.1  ALBUMIN 3.5   No results for input(s):  LIPASE, AMYLASE in the last 168 hours. No results for input(s): AMMONIA in the last 168 hours. Coagulation Profile: No results for input(s): INR, PROTIME in the last 168 hours. Cardiac Enzymes: Recent Labs  Lab 08/10/17 2221 08/11/17 0407 08/11/17 0958  TROPONINI <0.03 <0.03 <0.03   BNP (last 3 results) No results for input(s): PROBNP in the last 8760 hours. HbA1C: No results for input(s): HGBA1C in the last 72 hours. CBG: Recent Labs  Lab 08/10/17 1921  GLUCAP 90   Lipid Profile: No results for input(s): CHOL, HDL, LDLCALC, TRIG, CHOLHDL, LDLDIRECT in the last 72 hours. Thyroid Function Tests: Recent Labs  08/10/17 1946  TSH 1.054   Anemia Panel: No results for input(s): VITAMINB12, FOLATE, FERRITIN, TIBC, IRON, RETICCTPCT in the last 72 hours. Urine analysis:    Component Value Date/Time   COLORURINE STRAW (A) 08/10/2017 2147   APPEARANCEUR CLEAR 08/10/2017 2147   LABSPEC 1.002 (L) 08/10/2017 2147   PHURINE 6.0 08/10/2017 2147   GLUCOSEU NEGATIVE 08/10/2017 2147   HGBUR SMALL (A) 08/10/2017 2147   BILIRUBINUR NEGATIVE 08/10/2017 2147   KETONESUR NEGATIVE 08/10/2017 2147   PROTEINUR NEGATIVE 08/10/2017 2147   UROBILINOGEN 0.2 04/14/2015 1447   NITRITE NEGATIVE 08/10/2017 2147   LEUKOCYTESUR TRACE (A) 08/10/2017 2147   Sepsis Labs: @LABRCNTIP (procalcitonin:4,lacticidven:4)  ) Recent Results (from the past 240 hour(s))  Respiratory Panel by PCR     Status: None   Collection Time: 08/11/17  1:30 AM  Result Value Ref Range Status   Adenovirus NOT DETECTED NOT DETECTED Final   Coronavirus 229E NOT DETECTED NOT DETECTED Final   Coronavirus HKU1 NOT DETECTED NOT DETECTED Final   Coronavirus NL63 NOT DETECTED NOT DETECTED Final   Coronavirus OC43 NOT DETECTED NOT DETECTED Final   Metapneumovirus NOT DETECTED NOT DETECTED Final   Rhinovirus / Enterovirus NOT DETECTED NOT DETECTED Final   Influenza A NOT DETECTED NOT DETECTED Final   Influenza B NOT DETECTED  NOT DETECTED Final   Parainfluenza Virus 1 NOT DETECTED NOT DETECTED Final   Parainfluenza Virus 2 NOT DETECTED NOT DETECTED Final   Parainfluenza Virus 3 NOT DETECTED NOT DETECTED Final   Parainfluenza Virus 4 NOT DETECTED NOT DETECTED Final   Respiratory Syncytial Virus NOT DETECTED NOT DETECTED Final   Bordetella pertussis NOT DETECTED NOT DETECTED Final   Chlamydophila pneumoniae NOT DETECTED NOT DETECTED Final   Mycoplasma pneumoniae NOT DETECTED NOT DETECTED Final    Comment: Performed at [facility], [place] [address] [place], [place] [address]      Radiology Studies: Ct Abdomen Pelvis Wo Contrast  Result Date: 08/11/2017 CLINICAL DATA:  62 year old male with abnormal liver function tests. EXAM: CT CHEST, ABDOMEN AND PELVIS WITHOUT CONTRAST TECHNIQUE: Multidetector CT imaging of the chest, abdomen and pelvis was performed following the standard protocol without IV contrast. COMPARISON:  Renal ultrasound dated 06/10/2015 and CT of the abdomen pelvis dated 02/18/2015 FINDINGS: Evaluation of this exam is limited in the absence of intravenous contrast. Evaluation is also limited due to respiratory motion artifact. CT CHEST FINDINGS Cardiovascular: There is no cardiomegaly or pericardial effusion. The thoracic aorta and central pulmonary arteries are unremarkable on this noncontrast CT. Mediastinum/Nodes: There is no hilar or mediastinal adenopathy. The esophagus and the thyroid gland are grossly unremarkable. No mediastinal fluid collection. Lungs/Pleura: Patchy area of ground-glass and nodular density in the left lung base most consistent with pneumonia. Aspiration is not excluded. Clinical correlation recommended. Scattered smaller areas of ground-glass densities noted in the upper lobes anteriorly. A trace right pleural effusion may be present. There is no pneumothorax. The central airways are patent. Musculoskeletal: There is degenerative changes of the spine. No acute osseous pathology.  CT ABDOMEN PELVIS FINDINGS No intra-abdominal free air. Trace free fluid in the right posterior pelvis. Hepatobiliary: No focal liver abnormality is seen. No gallstones, gallbladder wall thickening, or biliary dilatation. Pancreas: Unremarkable. No pancreatic ductal dilatation or surrounding inflammatory changes. Spleen: Normal in size without focal abnormality. Adrenals/Urinary Tract: The adrenal glands are unremarkable. Chronic severe bilateral hydronephrosis as well as severely distended bilateral extrarenal pelvis. A punctate focus of parenchymal calcification noted in the upper pole  of the left kidney similar to prior CT. The ureters are dilated and tortuous similar to prior CT which may be related to chronic obstruction at the level of the UVJ or reflux. The urinary bladder is distended. There is slight trabeculated appearance of the bladder wall sequela of bladder outlet obstruction. The thickened bladder wall seen on the prior CT has been resolved. Stomach/Bowel: Large amount of dense stool noted throughout the colon. There is no evidence of bowel obstruction or active inflammation. The appendix is normal a metallic density is noted adjacent to the appendix. Vascular/Lymphatic: Mild aortoiliac atherosclerotic disease. The abdominal aorta and IVC are otherwise grossly unremarkable on this noncontrast CT. No portal venous gas. There is no adenopathy. Reproductive: Postsurgical changes of TURP. Other: Small left anterior abdominal wall lipoma. Musculoskeletal: Degenerative changes of the spine. Bilateral L5 pars defects. No listhesis. No acute osseous pathology. IMPRESSION: 1. Patchy ground-glass density at the left lung base most consistent with pneumonia. Clinical correlation and follow-up recommended. Aspiration is not excluded. Smaller areas of ground-glass density noted in the anterior upper lobes bilaterally. 2. Chronic severe bilateral hydronephroureter similar to the prior CT. Mild trabeculated  appearance of the bladder wall likely sequela of chronic bladder outlet obstruction. Interval resolution of the previously seen thickened bladder wall. 3. Postsurgical changes of TURP. 4. Constipation.  No bowel obstruction.  Normal appendix. Electronically Signed   By: Anner Crete M.D.   On: 08/11/2017 01:52   Dg Chest 2 View  Result Date: 08/10/2017 CLINICAL DATA:  One week history of cough and dizziness. EXAM: CHEST  2 VIEW COMPARISON:  01/15/2017 FINDINGS: The cardiac silhouette, mediastinal and hilar contours are within normal limits. Low lung volumes with vascular crowding and streaky basilar atelectasis. No definite infiltrates or effusions. No pneumothorax. The bony thorax is intact. IMPRESSION: Low lung volumes with vascular crowding and streaky basilar atelectasis. No definite infiltrates or effusions. Electronically Signed   By: Marijo Sanes M.D.   On: 08/10/2017 19:14   Ct Head Wo Contrast  Result Date: 08/10/2017 CLINICAL DATA:  62 year old male with altered mental status. EXAM: CT HEAD WITHOUT CONTRAST TECHNIQUE: Contiguous axial images were obtained from the base of the skull through the vertex without intravenous contrast. COMPARISON:  Head CT dated 03/31/2015 FINDINGS: Evaluation of this exam is limited due to motion artifact. Brain: The ventricles and sulci appropriate size for patient's age. The gray-white matter discrimination is preserved. There is no acute intracranial hemorrhage. No mass effect or midline shift. No extra-axial fluid collection. Vascular: No hyperdense vessel or unexpected calcification. Skull: Normal. Negative for fracture or focal lesion. Sinuses/Orbits: No acute finding. Other: None IMPRESSION: Unremarkable noncontrast CT of the brain. Electronically Signed   By: Anner Crete M.D.   On: 08/10/2017 22:25   Ct Chest Wo Contrast  Result Date: 08/11/2017 CLINICAL DATA:  62 year old male with abnormal liver function tests. EXAM: CT CHEST, ABDOMEN AND PELVIS  WITHOUT CONTRAST TECHNIQUE: Multidetector CT imaging of the chest, abdomen and pelvis was performed following the standard protocol without IV contrast. COMPARISON:  Renal ultrasound dated 06/10/2015 and CT of the abdomen pelvis dated 02/18/2015 FINDINGS: Evaluation of this exam is limited in the absence of intravenous contrast. Evaluation is also limited due to respiratory motion artifact. CT CHEST FINDINGS Cardiovascular: There is no cardiomegaly or pericardial effusion. The thoracic aorta and central pulmonary arteries are unremarkable on this noncontrast CT. Mediastinum/Nodes: There is no hilar or mediastinal adenopathy. The esophagus and the thyroid gland are grossly unremarkable. No mediastinal  fluid collection. Lungs/Pleura: Patchy area of ground-glass and nodular density in the left lung base most consistent with pneumonia. Aspiration is not excluded. Clinical correlation recommended. Scattered smaller areas of ground-glass densities noted in the upper lobes anteriorly. A trace right pleural effusion may be present. There is no pneumothorax. The central airways are patent. Musculoskeletal: There is degenerative changes of the spine. No acute osseous pathology. CT ABDOMEN PELVIS FINDINGS No intra-abdominal free air. Trace free fluid in the right posterior pelvis. Hepatobiliary: No focal liver abnormality is seen. No gallstones, gallbladder wall thickening, or biliary dilatation. Pancreas: Unremarkable. No pancreatic ductal dilatation or surrounding inflammatory changes. Spleen: Normal in size without focal abnormality. Adrenals/Urinary Tract: The adrenal glands are unremarkable. Chronic severe bilateral hydronephrosis as well as severely distended bilateral extrarenal pelvis. A punctate focus of parenchymal calcification noted in the upper pole of the left kidney similar to prior CT. The ureters are dilated and tortuous similar to prior CT which may be related to chronic obstruction at the level of the UVJ or  reflux. The urinary bladder is distended. There is slight trabeculated appearance of the bladder wall sequela of bladder outlet obstruction. The thickened bladder wall seen on the prior CT has been resolved. Stomach/Bowel: Large amount of dense stool noted throughout the colon. There is no evidence of bowel obstruction or active inflammation. The appendix is normal a metallic density is noted adjacent to the appendix. Vascular/Lymphatic: Mild aortoiliac atherosclerotic disease. The abdominal aorta and IVC are otherwise grossly unremarkable on this noncontrast CT. No portal venous gas. There is no adenopathy. Reproductive: Postsurgical changes of TURP. Other: Small left anterior abdominal wall lipoma. Musculoskeletal: Degenerative changes of the spine. Bilateral L5 pars defects. No listhesis. No acute osseous pathology. IMPRESSION: 1. Patchy ground-glass density at the left lung base most consistent with pneumonia. Clinical correlation and follow-up recommended. Aspiration is not excluded. Smaller areas of ground-glass density noted in the anterior upper lobes bilaterally. 2. Chronic severe bilateral hydronephroureter similar to the prior CT. Mild trabeculated appearance of the bladder wall likely sequela of chronic bladder outlet obstruction. Interval resolution of the previously seen thickened bladder wall. 3. Postsurgical changes of TURP. 4. Constipation.  No bowel obstruction.  Normal appendix. Electronically Signed   By: Anner Crete M.D.   On: 08/11/2017 01:52     Scheduled Meds: . aspirin EC  81 mg Oral Daily  . azithromycin  500 mg Oral QHS  . benztropine  0.5 mg Oral q morning - 10a  . bethanechol  10 mg Oral BID  . cariprazine  4.5 mg Oral Daily  . clozapine  600 mg Oral QHS  . docusate sodium  100 mg Oral BID  . enoxaparin (LOVENOX) injection  40 mg Subcutaneous Q24H  . finasteride  10 mg Oral BID  . guaiFENesin  600 mg Oral BID  . ipratropium-albuterol  3 mL Nebulization Q6H  .  methylPREDNISolone (SOLU-MEDROL) injection  60 mg Intravenous BID  . polyethylene glycol  17 g Oral Daily  . senna  2 tablet Oral QHS  . simvastatin  20 mg Oral q1800  . tamsulosin  0.4 mg Oral Daily  . temazepam  15 mg Oral QHS  . [START ON 08/16/2017] Vitamin D (Ergocalciferol)  50,000 Units Oral Weekly   Continuous Infusions: . sodium chloride 75 mL/hr at 08/12/17 0556  . cefTRIAXone (ROCEPHIN)  IV Stopped (08/11/17 2356)     LOS: 2 days    Time spent: 30 min    Janece Canterbury, MD [facility] Pager [phone]  If 7PM-7AM, please contact night-coverage [email or web address] Password [facility] 08/12/2017, 3:19 PM

## 2017-08-13 LAB — BASIC METABOLIC PANEL
ANION GAP: 8 (ref 5–15)
BUN: 30 mg/dL — AB (ref 6–20)
CHLORIDE: 109 mmol/L (ref 101–111)
CO2: 25 mmol/L (ref 22–32)
Calcium: 8.5 mg/dL — ABNORMAL LOW (ref 8.9–10.3)
Creatinine, Ser: 1.92 mg/dL — ABNORMAL HIGH (ref 0.61–1.24)
GFR calc Af Amer: 42 mL/min — ABNORMAL LOW (ref >60)
GFR calc non Af Amer: 37 mL/min — ABNORMAL LOW (ref >60)
GLUCOSE: 130 mg/dL — AB (ref 65–99)
POTASSIUM: 4.4 mmol/L (ref 3.5–5.1)
Sodium: 142 mmol/L (ref 135–145)

## 2017-08-13 LAB — CBC
HEMATOCRIT: 39.6 % (ref 39.0–52.0)
HEMOGLOBIN: 13.4 g/dL (ref 13.0–17.0)
MCH: 28.4 pg (ref 26.0–34.0)
MCHC: 33.8 g/dL (ref 30.0–36.0)
MCV: 83.9 fL (ref 78.0–100.0)
Platelets: 234 10*3/uL (ref 150–400)
RBC: 4.72 MIL/uL (ref 4.22–5.81)
RDW: 14.1 % (ref 11.5–15.5)
WBC: 24.6 10*3/uL — AB (ref 4.0–10.5)

## 2017-08-13 MED ORDER — METHYLPREDNISOLONE SODIUM SUCC 125 MG IJ SOLR
60.0000 mg | Freq: Two times a day (BID) | INTRAMUSCULAR | Status: AC
  Administered 2017-08-13: 60 mg via INTRAVENOUS
  Filled 2017-08-13: qty 2

## 2017-08-13 MED ORDER — IPRATROPIUM-ALBUTEROL 0.5-2.5 (3) MG/3ML IN SOLN
3.0000 mL | Freq: Three times a day (TID) | RESPIRATORY_TRACT | Status: DC
  Administered 2017-08-14 (×2): 3 mL via RESPIRATORY_TRACT
  Filled 2017-08-13 (×2): qty 3

## 2017-08-13 MED ORDER — PREDNISONE 20 MG PO TABS
60.0000 mg | ORAL_TABLET | Freq: Every day | ORAL | Status: DC
  Administered 2017-08-14: 60 mg via ORAL
  Filled 2017-08-13: qty 3

## 2017-08-13 NOTE — Progress Notes (Signed)
SATURATION QUALIFICATIONS: (This note is used to comply with regulatory documentation for home oxygen)  Patient Saturations on Room Air at Rest = 93-94% at rest for about 30 minutes   Patient Saturations on Room Air while Ambulating = 92-94%, ambulated 138ft  Patient Saturations on 0 Liters of oxygen while Ambulating = n/a  Please briefly explain why patient needs home oxygen: pt does not meet requirements for oxygen. Patient tolerated ambulation well.   Edwin [name] [facility] 08/13/2017 4:24 PM

## 2017-08-13 NOTE — Care Management Important Message (Signed)
Important Message  Patient Details  Name: Edwin [name] MRN: [id number] Date of Birth: 14-Feb-1956   Medicare Important Message Given:  Yes    Kerin Salen 08/13/2017, 12:08 [place] Message  Patient Details  Name: Edwin [name] MRN: [id number] Date of Birth: 12/13/1955   Medicare Important Message Given:  Yes    Kerin Salen 08/13/2017, 12:08 PM

## 2017-08-13 NOTE — Progress Notes (Signed)
PROGRESS NOTE  Edwin [name]  FYB:[id number] DOB: [birth year] DOA: 08/10/2017 PCP: Beaulah Corin, [facility]  Brief Narrative:   Patient is a 62 year old male with paranoid schizophrenia, hyperlipidemia, chronic kidney disease stage III, prostate cancer who presents with several days of cough and congestion.  He was seen in the urgent care center about 4 days prior to admission and was started on antibiotics, however he continued to have worsening shortness of breath and today he was so weak that he fell down and was unable to get back up.  In the emergency department, his white blood cell count was 23,000 he was hypothermic to the low 90s and chest x-ray demonstrated low lung volumes with vascular crowding basilar atelectasis but no obvious infiltrates or effusions, however, given his symptoms to suspect that he has community-acquired pneumonia.  He underwent CT of the chest, abdomen and pelvis in the ER and was confirmed to have pneumonia.  He was placed in a bear hugger in the ER.  He was started on steroids and antibiotics and is starting to feel better.  Assessment & Plan:  Sepsis (leukocytosis, hypothermia, tachycardia, tachypnea, hypotension, altered mental status) due to community-acquired pneumonia.  Sepsis physiology resolved.  - Continue ceftriaxone and azithromycin day 3 -  Respiratory viral panel negative -  F/u blood cultures  Acute respiratory failure due to acute COPD exacerbation, weaning O2 Continue solumedrol to 60mg  IV BID today and will transition to prednisone tomorrow  Continue duonebs with albuterol prn  Hyponatremia likely due to dehydration and pneumonia, resolved with IVF.  Chronic kidney disease stage III, creatinine at baseline of 1.7-1.8, creatinine up to 2 with difficulty urinating, possibly due to duonebs -  PVR:  586mL retained urine -  Coude catheter placed on 1/14 and autodiuresing  Chronic bilateral hydronephrosis with urinary outflow obstruction due to  BPH -  Coude catheter placed on 1/14 -  Continue flomax, finasteride, and bethanechol  ABnormal liver function, AST was barely above normal limits likely due to dehydration -  LFTs in AM  Schizoaffective disorder, stable, continue home medications -  cariprazine -  clozapine -  Temazepam -  Benztropine  Constipation noted on CT, resolved  -  Continue miralax, senna, colace and prn bisacodyl  Leukocytosis due to steroids, d/c CBC  DVT prophylaxis:  lovenox Code Status:  Full code Family Communication:  Patient alone Disposition Plan:  Anticipate discharge home in 2 days   Consultants:   none  Procedures:  none  Antimicrobials:  Anti-infectives (From admission, onward)   Start     Dose/Rate Route Frequency Ordered Stop   08/12/17 0200  vancomycin (VANCOCIN) 1,250 mg in sodium chloride 0.9 % 250 mL IVPB  Status:  Discontinued     1,250 mg 166.7 mL/hr over 90 Minutes Intravenous Every 24 hours 08/11/17 0137 08/11/17 2037   08/11/17 2300  cefTRIAXone (ROCEPHIN) 1 g in dextrose 5 % 50 mL IVPB     1 g 100 mL/hr over 30 Minutes Intravenous Every 24 hours 08/11/17 2051     08/11/17 2200  azithromycin (ZITHROMAX) tablet 500 mg     500 mg Oral Daily at bedtime 08/11/17 2037 08/14/17 2159   08/11/17 0800  piperacillin-tazobactam (ZOSYN) IVPB 3.375 g  Status:  Discontinued     3.375 g 12.5 mL/hr over 240 Minutes Intravenous Every 8 hours 08/11/17 0135 08/11/17 2037   08/11/17 0200  piperacillin-tazobactam (ZOSYN) IVPB 3.375 g     3.375 g 12.5 mL/hr over 240 Minutes Intravenous STAT  08/11/17 0135 08/11/17 0306   08/11/17 0200  vancomycin (VANCOCIN) 2,000 mg in sodium chloride 0.9 % 500 mL IVPB     2,000 mg 250 mL/hr over 120 Minutes Intravenous STAT 08/11/17 0137 08/11/17 0433   08/10/17 2130  cefTRIAXone (ROCEPHIN) 1 g in dextrose 5 % 50 mL IVPB     1 g 100 mL/hr over 30 Minutes Intravenous  Once 08/10/17 2120 08/10/17 2306   08/10/17 2130  azithromycin (ZITHROMAX) 500 mg in  dextrose 5 % 250 mL IVPB     500 mg 250 mL/hr over 60 Minutes Intravenous  Once 08/10/17 2120 08/11/17 0026       Subjective:  Still coughing, productive of thick phlegm, wheezing and SOB at rest.  Denies abdominal or back pain.    Objective: Vitals:   08/13/17 0611 08/13/17 0830 08/13/17 1304 08/13/17 1342  BP: 107/61  116/87   Pulse: 84  77   Resp: 18  18   Temp: 98.2 F (36.8 C)  98.3 F (36.8 C)   TempSrc: Oral  Oral   SpO2: 99% 98% 96% 95%  Weight: 82.4 kg (181 lb 10.5 oz)     Height:        Intake/Output Summary (Last 24 hours) at 08/13/2017 1518 Last data filed at 08/13/2017 1300 Gross per 24 hour  Intake 1657.5 ml  Output 2300 ml  Net -642.5 ml   Filed Weights   08/11/17 0122 08/13/17 0611  Weight: 88 kg (194 lb) 82.4 kg (181 lb 10.5 oz)    Examination:  General exam:  Adult male.  No acute distress.  HEENT:  NCAT, MMM Respiratory system:  Rales at the bases only today, wheezing is lower pitched, still diminished at the bases, but improved aeration.  + rhonchi Cardiovascular system: Regular rate and rhythm, normal S1/S2. No murmurs, rubs, gallops or clicks.  Warm extremities Gastrointestinal system: Normal active bowel sounds, soft, nondistended, nontender. MSK:  Normal tone and bulk, no lower extremity edema Neuro:  Grossly intact  Data Reviewed: I have personally reviewed following labs and imaging studies  CBC: Recent Labs  Lab 08/10/17 1946 08/11/17 0627 08/12/17 0514 08/13/17 0617  WBC 23.5* 15.4* 23.3* 24.6*  NEUTROABS  --  13.3*  --   --   HGB 15.4 13.5 13.7 13.4  HCT 43.8 39.4 40.8 39.6  MCV 81.9 82.3 83.8 83.9  PLT 281 270 278 119   Basic Metabolic Panel: Recent Labs  Lab 08/10/17 1946 08/11/17 0407 08/12/17 0514 08/13/17 0617  NA 130*  --  140 142  K 3.9  --  3.9 4.4  CL 97*  --  110 109  CO2 23  --  24 25  GLUCOSE 86  --  127* 130*  BUN 24*  --  26* 30*  CREATININE 1.68* 1.80* 2.05* 1.92*  CALCIUM 9.0  --  8.5* 8.5*    GFR: Estimated Creatinine Clearance: 44.1 mL/min (A) (by C-G formula based on SCr of 1.92 mg/dL (H)). Liver Function Tests: Recent Labs  Lab 08/10/17 1946  AST 43*  ALT 36  ALKPHOS 118  BILITOT 0.5  PROT 7.1  ALBUMIN 3.5   No results for input(s): LIPASE, AMYLASE in the last 168 hours. No results for input(s): AMMONIA in the last 168 hours. Coagulation Profile: No results for input(s): INR, PROTIME in the last 168 hours. Cardiac Enzymes: Recent Labs  Lab 08/10/17 2221 08/11/17 0407 08/11/17 0958  TROPONINI <0.03 <0.03 <0.03   BNP (last 3 results) No results for  input(s): PROBNP in the last 8760 hours. HbA1C: No results for input(s): HGBA1C in the last 72 hours. CBG: Recent Labs  Lab 08/10/17 1921  GLUCAP 90   Lipid Profile: No results for input(s): CHOL, HDL, LDLCALC, TRIG, CHOLHDL, LDLDIRECT in the last 72 hours. Thyroid Function Tests: Recent Labs    08/10/17 1946  TSH 1.054   Anemia Panel: No results for input(s): VITAMINB12, FOLATE, FERRITIN, TIBC, IRON, RETICCTPCT in the last 72 hours. Urine analysis:    Component Value Date/Time   COLORURINE STRAW (A) 08/10/2017 2147   APPEARANCEUR CLEAR 08/10/2017 2147   LABSPEC 1.002 (L) 08/10/2017 2147   PHURINE 6.0 08/10/2017 2147   GLUCOSEU NEGATIVE 08/10/2017 2147   HGBUR SMALL (A) 08/10/2017 2147   BILIRUBINUR NEGATIVE 08/10/2017 2147   KETONESUR NEGATIVE 08/10/2017 2147   PROTEINUR NEGATIVE 08/10/2017 2147   UROBILINOGEN 0.2 04/14/2015 1447   NITRITE NEGATIVE 08/10/2017 2147   LEUKOCYTESUR TRACE (A) 08/10/2017 2147   Sepsis Labs: @LABRCNTIP (procalcitonin:4,lacticidven:4)  ) Recent Results (from the past 240 hour(s))  Blood Culture (routine x 2)     Status: None (Preliminary result)   Collection Time: 08/10/17  7:46 PM  Result Value Ref Range Status   Specimen Description BLOOD RIGHT ANTECUBITAL  Final   Special Requests IN PEDIATRIC BOTTLE Blood Culture adequate volume  Final   Culture   Final     NO GROWTH 2 DAYS Performed at [facility], [place] [address], [place], [place] [address]    Report Status PENDING  Incomplete  Blood Culture (routine x 2)     Status: None (Preliminary result)   Collection Time: 08/10/17  7:46 PM  Result Value Ref Range Status   Specimen Description BLOOD LEFT ANTECUBITAL  Final   Special Requests   Final    BOTTLES DRAWN AEROBIC AND ANAEROBIC Blood Culture adequate volume   Culture   Final    NO GROWTH 2 DAYS Performed at [facility], [place] [address] [place],  [address]    Report Status PENDING  Incomplete  Respiratory Panel by PCR     Status: None   Collection Time: 08/11/17  1:30 AM  Result Value Ref Range Status   Adenovirus NOT DETECTED NOT DETECTED Final   Coronavirus 229E NOT DETECTED NOT DETECTED Final   Coronavirus HKU1 NOT DETECTED NOT DETECTED Final   Coronavirus NL63 NOT DETECTED NOT DETECTED Final   Coronavirus OC43 NOT DETECTED NOT DETECTED Final   Metapneumovirus NOT DETECTED NOT DETECTED Final   Rhinovirus / Enterovirus NOT DETECTED NOT DETECTED Final   Influenza A NOT DETECTED NOT DETECTED Final   Influenza B NOT DETECTED NOT DETECTED Final   Parainfluenza Virus 1 NOT DETECTED NOT DETECTED Final   Parainfluenza Virus 2 NOT DETECTED NOT DETECTED Final   Parainfluenza Virus 3 NOT DETECTED NOT DETECTED Final   Parainfluenza Virus 4 NOT DETECTED NOT DETECTED Final   Respiratory Syncytial Virus NOT DETECTED NOT DETECTED Final   Bordetella pertussis NOT DETECTED NOT DETECTED Final   Chlamydophila pneumoniae NOT DETECTED NOT DETECTED Final   Mycoplasma pneumoniae NOT DETECTED NOT DETECTED Final    Comment: Performed at [facility], [place] [address] [place],  [address]      Radiology Studies: No results found.   Scheduled Meds: . aspirin EC  81 mg Oral Daily  . azithromycin  500 mg Oral QHS  . benztropine  0.5 mg Oral q morning - 10a  . bethanechol  10 mg Oral BID  .  cariprazine  4.5 mg Oral  Daily  . clozapine  600 mg Oral QHS  . docusate sodium  100 mg Oral BID  . enoxaparin (LOVENOX) injection  40 mg Subcutaneous Q24H  . finasteride  10 mg Oral BID  . guaiFENesin  600 mg Oral BID  . ipratropium-albuterol  3 mL Nebulization Q6H  . methylPREDNISolone (SOLU-MEDROL) injection  60 mg Intravenous BID  . polyethylene glycol  17 g Oral Daily  . senna  2 tablet Oral QHS  . simvastatin  20 mg Oral q1800  . tamsulosin  0.4 mg Oral Daily  . temazepam  15 mg Oral QHS  . [START ON 08/16/2017] Vitamin D (Ergocalciferol)  50,000 Units Oral Weekly   Continuous Infusions: . sodium chloride 75 mL/hr at 08/13/17 0956  . cefTRIAXone (ROCEPHIN)  IV Stopped (08/12/17 2327)     LOS: 3 days    Time spent: 30 min    Janece Canterbury, MD [facility] Pager [phone]  If 7PM-7AM, please contact night-coverage [email or web address] Password [facility] 08/13/2017, 3:18 PM

## 2017-08-13 NOTE — Progress Notes (Signed)
Noted may need HHRN. [place] agency list given to patient await choice.

## 2017-08-13 NOTE — Progress Notes (Signed)
Patient disconnected  the foley bag from the urinary catheter, 16 Fr Coude,  plug it to the suction tubing and the urine from the foley bag suctioning to the canister. Education provided. Suction tubing and canister changed,  urinary bag reconnected to the urinary catheter.

## 2017-08-13 NOTE — Care Management Note (Signed)
Case Management Note  Patient Details  Name: RAYNER ERMAN MRN: [id number] Date of Birth: 24-Apr-1956  Subjective/Objective:  62 y/o m admitted w/Sepsis. Hx: paranoid schizophrenia, CKD. From home. On 02-will monitor.                  Action/Plan:d/c plan home.   Expected Discharge Date:                  Expected Discharge Plan:  Home/Self Care  In-House Referral:     Discharge planning Services  CM Consult  Post Acute Care Choice:    Choice offered to:     DME Arranged:    DME Agency:     HH Arranged:    HH Agency:     Status of Service:  In process, will continue to follow  If discussed at Long Length of Stay Meetings, dates discussed:    Additional Comments:  Dessa Phi, RN 08/13/2017, 11:08 AM

## 2017-08-13 NOTE — Progress Notes (Signed)
Assumed care of patient at this time. Patient is stable with no complaints at this time. Agree with previously documented assessment. Will continue to monitor patient.   

## 2017-08-13 NOTE — Plan of Care (Signed)
  Clinical Measurements: Respiratory complications will improve 08/13/2017 2313 - Progressing by Ashley Murrain, RN

## 2017-08-14 DIAGNOSIS — T68XXXA Hypothermia, initial encounter: Secondary | ICD-10-CM

## 2017-08-14 LAB — COMPREHENSIVE METABOLIC PANEL
ALBUMIN: 2.8 g/dL — AB (ref 3.5–5.0)
ALK PHOS: 84 U/L (ref 38–126)
ALT: 33 U/L (ref 17–63)
AST: 23 U/L (ref 15–41)
Anion gap: 7 (ref 5–15)
BILIRUBIN TOTAL: 0.7 mg/dL (ref 0.3–1.2)
BUN: 31 mg/dL — AB (ref 6–20)
CALCIUM: 8.6 mg/dL — AB (ref 8.9–10.3)
CO2: 24 mmol/L (ref 22–32)
CREATININE: 1.72 mg/dL — AB (ref 0.61–1.24)
Chloride: 109 mmol/L (ref 101–111)
GFR calc Af Amer: 48 mL/min — ABNORMAL LOW (ref >60)
GFR calc non Af Amer: 42 mL/min — ABNORMAL LOW (ref >60)
GLUCOSE: 132 mg/dL — AB (ref 65–99)
Potassium: 4.1 mmol/L (ref 3.5–5.1)
SODIUM: 140 mmol/L (ref 135–145)
TOTAL PROTEIN: 5.8 g/dL — AB (ref 6.5–8.1)

## 2017-08-14 MED ORDER — AZITHROMYCIN 500 MG PO TABS: 500.0000 mg | ORAL_TABLET | Freq: Once | ORAL | 0 refills | Status: AC

## 2017-08-14 MED ORDER — PREDNISONE 20 MG PO TABS: ORAL_TABLET | ORAL | 0 refills | Status: AC

## 2017-08-14 MED ORDER — ALBUTEROL SULFATE HFA 108 (90 BASE) MCG/ACT IN AERS: 2.0000 | INHALATION_SPRAY | Freq: Four times a day (QID) | RESPIRATORY_TRACT | 1 refills | Status: DC | PRN

## 2017-08-14 MED ORDER — CEFDINIR 300 MG PO CAPS: 300.0000 mg | ORAL_CAPSULE | Freq: Two times a day (BID) | ORAL | 0 refills | Status: AC

## 2017-08-14 MED ORDER — ACETAMINOPHEN 500 MG PO TABS: 1000.0000 mg | ORAL_TABLET | Freq: Four times a day (QID) | ORAL | 0 refills | Status: DC | PRN

## 2017-08-14 NOTE — Progress Notes (Signed)
Patient and family given discharge, follow up, foley care, and medication instructions, verbalized understanding, IV was removed earlier in shift by patient, foley catheter intact, family to transport home

## 2017-08-14 NOTE — Discharge Summary (Signed)
Physician Discharge Summary  ALBARAA SWINGLE DZH:[id number] DOB: Nov 04, 1955 DOA: 08/10/2017  PCP: Beaulah Corin, [facility]  Admit date: 08/10/2017 Discharge date: 08/14/2017  Time spent: over 30 minutes  Recommendations for Outpatient Follow-up:  1. Follow up outpatient CBC/CMP 2. Follow up outpatient resp status, need for controller medication?  PFTs? 3. Consider follow up CXR in 3-4 weeks to ensure resolution of CXR findigns 4. Continue to encourage smoking cessation   5. Follow up with urology for urinary retention 6. Follow up final blood cx   Discharge Diagnoses:  Principal Problem:   Hypothermia Active Problems:   Leukocytosis   Hyponatremia   Discharge Condition: stable  Diet recommendation: heart healthy  Filed Weights   08/11/17 0122 08/13/17 0611  Weight: 88 kg (194 lb) 82.4 kg (181 lb 10.5 oz)    History of present illness:  Patient is Edwin [name] 62 year old male with paranoid schizophrenia, hyperlipidemia, chronic kidney disease stage III, prostate cancer who presents with several days of cough and congestion.  He was seen in the urgent care center about 4 days prior to admission and was started on antibiotics, however he continued to have worsening shortness of breath and today he was so weak that he fell down and was unable to get back up.  In the emergency department, his white blood cell count was 23,000 he was hypothermic to the low 90s and chest x-ray demonstrated low lung volumes with vascular crowding basilar atelectasis but no obvious infiltrates or effusions, however, given his symptoms to suspect that he has community-acquired pneumonia.  He underwent CT of the chest, abdomen and pelvis in the ER and was confirmed to have pneumonia.  He was placed in Shasta Chinn bear hugger in the ER.  He was started on steroids and antibiotics and is starting to feel better.  Hospital Course:  Sepsis (leukocytosis, hypothermia, tachycardia, tachypnea, hypotension, altered mental status) due to  community-acquired pneumonia.  Sepsis physiology resolved.  -  S/p 4 days of ceftriaxone and azithromycin.  Discharged with 1 additional day of azithromycin and cefdinir.  -  Respiratory viral panel negative -  F/u blood cultures, NGTD x 3 days  Acute respiratory failure due to acute COPD exacerbation - currently on room air and ambulated without O2 yesterday - discharged with steroid taper - prescribed albuterol at discharge, pt may need controller medication  Hyponatremia likely due to dehydration and pneumonia, resolved with IVF.  Chronic kidney disease stage III, creatinine at baseline of 1.7-1.8, worsened to 2 while here with some difficulty urinating and urinary retention, but now improved after foley catheter placement -  PVR:  539mL retained urine -  Coude catheter placed on 1/14 and autodiuresing  Chronic bilateral hydronephrosis with urinary outflow obstruction due to BPH -  Coude catheter placed on 1/14 [ ]  outpatient urology follow up scheduled Noted to have chronic severe bilateral hydronephroureter similar to prior CT -  Continue flomax, finasteride, and bethanechol  ABnormal liver function - AST slightly elevated, thought 2/2 dehydration - improved  Schizoaffective disorder, stable, continue home medications Denies SI/HI.  Possible visual hallucination last night, but resolved.  He notes some paranoia, but this is baseline.  Discussed with Dr. Casimiro Needle who notes this seems c/w with his baseline.  -  cariprazine -  clozapine -  Temazepam -  Benztropine  Constipation noted on CT, resolved  -  Continue miralax, senna, colace and prn bisacodyl  Leukocytosis due to steroids, f/u outpatient  Procedures:  none   Consultations:  none  Discharge  Exam: Vitals:   08/14/17 0813 08/14/17 1403  BP:    Pulse:    Resp:    Temp:    SpO2: 94% 92%   SOB better.  Notes bothersome cough. No edema.   Would be ok going home today.   Denies SI/HI.  Thought he  saw someone last night in the corner, but that is gone.  Notes some paranoia, but notes this seems baseline. (discussed with Dr. Casimiro Needle who notes this sounds like baseline for pt) Brother at bedside at discharge and discussed plan with him as well.   General: No acute distress. Cardiovascular: Heart sounds show Hays Dunnigan regular rate, and rhythm. No gallops or rubs. No murmurs. No JVD. Lungs: Clear to auscultation bilaterally with good air movement. No rales, rhonchi or wheezes. Abdomen: Soft, nontender, nondistended with normal active bowel sounds. No masses. No hepatosplenomegaly. Neurological: Alert and oriented 3. Moves all extremities 4 with equal strength. Cranial nerves II through XII grossly intact. Skin: Warm and dry. No rashes or lesions. Extremities: No clubbing or cyanosis. No edema.  Psychiatric: Mood and affect are normal. Insight and judgment are appropriate.   Discharge Instructions   Discharge Instructions    Call MD for:  difficulty breathing, headache or visual disturbances   Complete by:  As directed    Call MD for:  extreme fatigue   Complete by:  As directed    Call MD for:  persistant dizziness or light-headedness   Complete by:  As directed    Call MD for:  persistant nausea and vomiting   Complete by:  As directed    Call MD for:  redness, tenderness, or signs of infection (pain, swelling, redness, odor or green/yellow discharge around incision site)   Complete by:  As directed    Call MD for:  temperature >100.4   Complete by:  As directed    Diet - low sodium heart healthy   Complete by:  As directed    Discharge instructions   Complete by:  As directed    You were seen for pneumonia and Arva Slaugh COPD exacerbation.  I will discharge you with Jakaleb Payer steroid taper and antibiotics for an additional day of therapy (take these starting tonight).  I will also prescribe albuterol for you.  I want you to take this every 6 hours for the next 24-48 hours until your breathing is back  to normal.  Quitting smoking is extremely important.  Please follow up with your PCP within the next week.  I also have an appointment with [facility] for your foley catheter on 1/21 at 9:15 AM.  Return if you have new, worsening, or recurrent symptoms.  Please have your PCP request records from this hospitalization so they know what was done and the next steps.   Increase activity slowly   Complete by:  As directed      Allergies as of 08/14/2017   No Known Allergies     Medication List    STOP taking these medications   cephALEXin 500 MG capsule Commonly known as:  KEFLEX   oxyCODONE-acetaminophen 5-325 MG tablet Commonly known as:  PERCOCET/ROXICET     TAKE these medications   acetaminophen 500 MG tablet Commonly known as:  TYLENOL Take 2 tablets (1,000 mg total) by mouth every 6 (six) hours as needed for moderate pain or headache. What changed:  when to take this   albuterol 108 (90 Base) MCG/ACT inhaler Commonly known as:  PROVENTIL HFA;VENTOLIN HFA Inhale 2 puffs  into the lungs every 6 (six) hours as needed for wheezing or shortness of breath.   aspirin EC 81 MG tablet Take 81 mg by mouth daily.   azithromycin 500 MG tablet Commonly known as:  ZITHROMAX Take 1 tablet (500 mg total) by mouth once for 1 dose.   benztropine 0.5 MG tablet Commonly known as:  COGENTIN Take 0.5 mg by mouth every morning.   bethanechol 10 MG tablet Commonly known as:  URECHOLINE Take 10 mg by mouth 2 (two) times daily.   cefdinir 300 MG capsule Commonly known as:  OMNICEF Take 1 capsule (300 mg total) by mouth 2 (two) times daily for 1 day.   clozapine 200 MG tablet Commonly known as:  CLOZARIL Take 600 mg by mouth at bedtime.   finasteride 5 MG tablet Commonly known as:  PROSCAR Take 10 mg by mouth 2 (two) times daily.   predniSONE 20 MG tablet Commonly known as:  DELTASONE Take 2 tablets (40 mg total) by mouth daily with breakfast for 5 days, THEN 1.5 tablets (30 mg  total) daily with breakfast for 2 days, THEN 1 tablet (20 mg total) daily with breakfast for 2 days, THEN 0.5 tablets (10 mg total) daily with breakfast for 2 days. Start taking on:  08/15/2017   simvastatin 20 MG tablet Commonly known as:  ZOCOR Take 20 mg by mouth daily.   tamsulosin 0.4 MG Caps capsule Commonly known as:  FLOMAX Take 0.4 mg by mouth daily.   temazepam 15 MG capsule Commonly known as:  RESTORIL Take 15 mg by mouth at bedtime.   Vitamin D (Ergocalciferol) 50000 units Caps capsule Commonly known as:  DRISDOL Take 50,000 Units by mouth once Brien Lowe week.   VRAYLAR 4.5 MG Caps Generic drug:  Cariprazine HCl Take 1 capsule by mouth every morning.      No Known Allergies Follow-up Information    McKenzie, Candee Furbish, MD Follow up.   Specialty:  Urology Why:  You have an appointment at 9:15 on January 21st.   Contact information: [place] [address] [phone]        [facility], [facility] Follow up.   Specialty:  Internal Medicine Why:  Call to follow up within the next few days for your pneumonia and COPD Contact information: [place] [id number] [phone]            The results of significant diagnostics from this hospitalization (including imaging, microbiology, ancillary and laboratory) are listed below for reference.    Significant Diagnostic Studies: Ct Abdomen Pelvis Wo Contrast  Result Date: 08/11/2017 CLINICAL DATA:  63 year old male with abnormal liver function tests. EXAM: CT CHEST, ABDOMEN AND PELVIS WITHOUT CONTRAST TECHNIQUE: Multidetector CT imaging of the chest, abdomen and pelvis was performed following the standard protocol without IV contrast. COMPARISON:  Renal ultrasound dated 06/10/2015 and CT of the abdomen pelvis dated 02/18/2015 FINDINGS: Evaluation of this exam is limited in the absence of intravenous contrast. Evaluation is also limited due to respiratory motion artifact. CT CHEST FINDINGS  Cardiovascular: There is no cardiomegaly or pericardial effusion. The thoracic aorta and central pulmonary arteries are unremarkable on this noncontrast CT. Mediastinum/Nodes: There is no hilar or mediastinal adenopathy. The esophagus and the thyroid gland are grossly unremarkable. No mediastinal fluid collection. Lungs/Pleura: Patchy area of ground-glass and nodular density in the left lung base most consistent with pneumonia. Aspiration is not excluded. Clinical correlation recommended. Scattered smaller areas of ground-glass densities noted in the  upper lobes anteriorly. Naveyah Iacovelli trace right pleural effusion may be present. There is no pneumothorax. The central airways are patent. Musculoskeletal: There is degenerative changes of the spine. No acute osseous pathology. CT ABDOMEN PELVIS FINDINGS No intra-abdominal free air. Trace free fluid in the right posterior pelvis. Hepatobiliary: No focal liver abnormality is seen. No gallstones, gallbladder wall thickening, or biliary dilatation. Pancreas: Unremarkable. No pancreatic ductal dilatation or surrounding inflammatory changes. Spleen: Normal in size without focal abnormality. Adrenals/Urinary Tract: The adrenal glands are unremarkable. Chronic severe bilateral hydronephrosis as well as severely distended bilateral extrarenal pelvis. Dyanne Yorks punctate focus of parenchymal calcification noted in the upper pole of the left kidney similar to prior CT. The ureters are dilated and tortuous similar to prior CT which may be related to chronic obstruction at the level of the UVJ or reflux. The urinary bladder is distended. There is slight trabeculated appearance of the bladder wall sequela of bladder outlet obstruction. The thickened bladder wall seen on the prior CT has been resolved. Stomach/Bowel: Large amount of dense stool noted throughout the colon. There is no evidence of bowel obstruction or active inflammation. The appendix is normal Janyce Ellinger metallic density is noted adjacent to the  appendix. Vascular/Lymphatic: Mild aortoiliac atherosclerotic disease. The abdominal aorta and IVC are otherwise grossly unremarkable on this noncontrast CT. No portal venous gas. There is no adenopathy. Reproductive: Postsurgical changes of TURP. Other: Small left anterior abdominal wall lipoma. Musculoskeletal: Degenerative changes of the spine. Bilateral L5 pars defects. No listhesis. No acute osseous pathology. IMPRESSION: 1. Patchy ground-glass density at the left lung base most consistent with pneumonia. Clinical correlation and follow-up recommended. Aspiration is not excluded. Smaller areas of ground-glass density noted in the anterior upper lobes bilaterally. 2. Chronic severe bilateral hydronephroureter similar to the prior CT. Mild trabeculated appearance of the bladder wall likely sequela of chronic bladder outlet obstruction. Interval resolution of the previously seen thickened bladder wall. 3. Postsurgical changes of TURP. 4. Constipation.  No bowel obstruction.  Normal appendix. Electronically Signed   By: Anner Crete M.D.   On: 08/11/2017 01:52   Dg Chest 2 View  Result Date: 08/10/2017 CLINICAL DATA:  One week history of cough and dizziness. EXAM: CHEST  2 VIEW COMPARISON:  01/15/2017 FINDINGS: The cardiac silhouette, mediastinal and hilar contours are within normal limits. Low lung volumes with vascular crowding and streaky basilar atelectasis. No definite infiltrates or effusions. No pneumothorax. The bony thorax is intact. IMPRESSION: Low lung volumes with vascular crowding and streaky basilar atelectasis. No definite infiltrates or effusions. Electronically Signed   By: Marijo Sanes M.D.   On: 08/10/2017 19:14   Ct Head Wo Contrast  Result Date: 08/10/2017 CLINICAL DATA:  62 year old male with altered mental status. EXAM: CT HEAD WITHOUT CONTRAST TECHNIQUE: Contiguous axial images were obtained from the base of the skull through the vertex without intravenous contrast. COMPARISON:   Head CT dated 03/31/2015 FINDINGS: Evaluation of this exam is limited due to motion artifact. Brain: The ventricles and sulci appropriate size for patient's age. The gray-white matter discrimination is preserved. There is no acute intracranial hemorrhage. No mass effect or midline shift. No extra-axial fluid collection. Vascular: No hyperdense vessel or unexpected calcification. Skull: Normal. Negative for fracture or focal lesion. Sinuses/Orbits: No acute finding. Other: None IMPRESSION: Unremarkable noncontrast CT of the brain. Electronically Signed   By: Anner Crete M.D.   On: 08/10/2017 22:25   Ct Chest Wo Contrast  Result Date: 08/11/2017 CLINICAL DATA:  62 year old male with  abnormal liver function tests. EXAM: CT CHEST, ABDOMEN AND PELVIS WITHOUT CONTRAST TECHNIQUE: Multidetector CT imaging of the chest, abdomen and pelvis was performed following the standard protocol without IV contrast. COMPARISON:  Renal ultrasound dated 06/10/2015 and CT of the abdomen pelvis dated 02/18/2015 FINDINGS: Evaluation of this exam is limited in the absence of intravenous contrast. Evaluation is also limited due to respiratory motion artifact. CT CHEST FINDINGS Cardiovascular: There is no cardiomegaly or pericardial effusion. The thoracic aorta and central pulmonary arteries are unremarkable on this noncontrast CT. Mediastinum/Nodes: There is no hilar or mediastinal adenopathy. The esophagus and the thyroid gland are grossly unremarkable. No mediastinal fluid collection. Lungs/Pleura: Patchy area of ground-glass and nodular density in the left lung base most consistent with pneumonia. Aspiration is not excluded. Clinical correlation recommended. Scattered smaller areas of ground-glass densities noted in the upper lobes anteriorly. Pershing Skidmore trace right pleural effusion may be present. There is no pneumothorax. The central airways are patent. Musculoskeletal: There is degenerative changes of the spine. No acute osseous  pathology. CT ABDOMEN PELVIS FINDINGS No intra-abdominal free air. Trace free fluid in the right posterior pelvis. Hepatobiliary: No focal liver abnormality is seen. No gallstones, gallbladder wall thickening, or biliary dilatation. Pancreas: Unremarkable. No pancreatic ductal dilatation or surrounding inflammatory changes. Spleen: Normal in size without focal abnormality. Adrenals/Urinary Tract: The adrenal glands are unremarkable. Chronic severe bilateral hydronephrosis as well as severely distended bilateral extrarenal pelvis. Shazia Mitchener punctate focus of parenchymal calcification noted in the upper pole of the left kidney similar to prior CT. The ureters are dilated and tortuous similar to prior CT which may be related to chronic obstruction at the level of the UVJ or reflux. The urinary bladder is distended. There is slight trabeculated appearance of the bladder wall sequela of bladder outlet obstruction. The thickened bladder wall seen on the prior CT has been resolved. Stomach/Bowel: Large amount of dense stool noted throughout the colon. There is no evidence of bowel obstruction or active inflammation. The appendix is normal Archana Eckman metallic density is noted adjacent to the appendix. Vascular/Lymphatic: Mild aortoiliac atherosclerotic disease. The abdominal aorta and IVC are otherwise grossly unremarkable on this noncontrast CT. No portal venous gas. There is no adenopathy. Reproductive: Postsurgical changes of TURP. Other: Small left anterior abdominal wall lipoma. Musculoskeletal: Degenerative changes of the spine. Bilateral L5 pars defects. No listhesis. No acute osseous pathology. IMPRESSION: 1. Patchy ground-glass density at the left lung base most consistent with pneumonia. Clinical correlation and follow-up recommended. Aspiration is not excluded. Smaller areas of ground-glass density noted in the anterior upper lobes bilaterally. 2. Chronic severe bilateral hydronephroureter similar to the prior CT. Mild  trabeculated appearance of the bladder wall likely sequela of chronic bladder outlet obstruction. Interval resolution of the previously seen thickened bladder wall. 3. Postsurgical changes of TURP. 4. Constipation.  No bowel obstruction.  Normal appendix. Electronically Signed   By: Anner Crete M.D.   On: 08/11/2017 01:52    Microbiology: Recent Results (from the past 240 hour(s))  Blood Culture (routine x 2)     Status: None (Preliminary result)   Collection Time: 08/10/17  7:46 PM  Result Value Ref Range Status   Specimen Description BLOOD RIGHT ANTECUBITAL  Final   Special Requests IN PEDIATRIC BOTTLE Blood Culture adequate volume  Final   Culture   Final    NO GROWTH 3 DAYS Performed at [facility], [address] [place], [place] [address]    Report Status PENDING  Incomplete  Blood Culture (routine x 2)     Status: None (Preliminary result)   Collection Time: 08/10/17  7:46 PM  Result Value Ref Range Status   Specimen Description BLOOD LEFT ANTECUBITAL  Final   Special Requests   Final    BOTTLES DRAWN AEROBIC AND ANAEROBIC Blood Culture adequate volume   Culture   Final    NO GROWTH 3 DAYS Performed at [facility], [address] [place], [place] [address]    Report Status PENDING  Incomplete  Respiratory Panel by PCR     Status: None   Collection Time: 08/11/17  1:30 AM  Result Value Ref Range Status   Adenovirus NOT DETECTED NOT DETECTED Final   Coronavirus 229E NOT DETECTED NOT DETECTED Final   Coronavirus HKU1 NOT DETECTED NOT DETECTED Final   Coronavirus NL63 NOT DETECTED NOT DETECTED Final   Coronavirus OC43 NOT DETECTED NOT DETECTED Final   Metapneumovirus NOT DETECTED NOT DETECTED Final   Rhinovirus / Enterovirus NOT DETECTED NOT DETECTED Final   Influenza Dayvin Aber NOT DETECTED NOT DETECTED Final   Influenza B NOT DETECTED NOT DETECTED Final   Parainfluenza Virus 1 NOT DETECTED NOT DETECTED Final   Parainfluenza Virus 2 NOT DETECTED NOT DETECTED  Final   Parainfluenza Virus 3 NOT DETECTED NOT DETECTED Final   Parainfluenza Virus 4 NOT DETECTED NOT DETECTED Final   Respiratory Syncytial Virus NOT DETECTED NOT DETECTED Final   Bordetella pertussis NOT DETECTED NOT DETECTED Final   Chlamydophila pneumoniae NOT DETECTED NOT DETECTED Final   Mycoplasma pneumoniae NOT DETECTED NOT DETECTED Final    Comment: Performed at [facility], [place] [address], [place], [place] [address]     Labs: Basic Metabolic Panel: Recent Labs  Lab 08/10/17 1946 08/11/17 0407 08/12/17 0514 08/13/17 0617 08/14/17 0504  NA 130*  --  140 142 140  K 3.9  --  3.9 4.4 4.1  CL 97*  --  110 109 109  CO2 23  --  24 25 24   GLUCOSE 86  --  127* 130* 132*  BUN 24*  --  26* 30* 31*  CREATININE 1.68* 1.80* 2.05* 1.92* 1.72*  CALCIUM 9.0  --  8.5* 8.5* 8.6*   Liver Function Tests: Recent Labs  Lab 08/10/17 1946 08/14/17 0504  AST 43* 23  ALT 36 33  ALKPHOS 118 84  BILITOT 0.5 0.7  PROT 7.1 5.8*  ALBUMIN 3.5 2.8*   No results for input(s): LIPASE, AMYLASE in the last 168 hours. No results for input(s): AMMONIA in the last 168 hours. CBC: Recent Labs  Lab 08/10/17 1946 08/11/17 0627 08/12/17 0514 08/13/17 0617  WBC 23.5* 15.4* 23.3* 24.6*  NEUTROABS  --  13.3*  --   --   HGB 15.4 13.5 13.7 13.4  HCT 43.8 39.4 40.8 39.6  MCV 81.9 82.3 83.8 83.9  PLT 281 270 278 234   Cardiac Enzymes: Recent Labs  Lab 08/10/17 2221 08/11/17 0407 08/11/17 0958  TROPONINI <0.03 <0.03 <0.03   BNP: BNP (last 3 results) No results for input(s): BNP in the last 8760 hours.  ProBNP (last 3 results) No results for input(s): PROBNP in the last 8760 hours.  CBG: Recent Labs  Lab 08/10/17 1921  GLUCAP 90       Signed:  Fayrene Helper MD.  [facility] 08/14/2017, 8:26 PM

## 2017-08-14 NOTE — Care Management Note (Signed)
Case Management Note  Patient Details  Name: Edwin [name] MRN: [id number] Date of Birth: 10/11/1955  Subjective/Objective: Patient chose [facility] for [facility] if needed-rep Jermaine aware.                    Action/Plan:d/c home w/HHC.   Expected Discharge Date:                  Expected Discharge Plan:  [place]  In-House Referral:     Discharge planning Services  CM Consult  Post Acute Care Choice:    Choice offered to:  Patient  DME Arranged:    DME Agency:     HH Arranged:    [place] Agency:     Status of Service:  In process, will continue to follow  If discussed at Long Length of Stay Meetings, dates discussed:    Additional Comments:  Dessa Phi, RN 08/14/2017, 10:49 AM

## 2017-08-16 LAB — CULTURE, BLOOD (ROUTINE X 2)
CULTURE: NO GROWTH
CULTURE: NO GROWTH
SPECIAL REQUESTS: ADEQUATE
Special Requests: ADEQUATE

## 2020-05-15 ENCOUNTER — Other Ambulatory Visit: Payer: Self-pay | Admitting: Urology

## 2020-05-17 ENCOUNTER — Other Ambulatory Visit: Payer: Self-pay | Admitting: Urology

## 2020-06-02 ENCOUNTER — Other Ambulatory Visit: Payer: Self-pay | Admitting: Urology

## 2021-05-13 ENCOUNTER — Other Ambulatory Visit: Payer: Self-pay | Admitting: Urology

## 2021-06-20 ENCOUNTER — Other Ambulatory Visit: Payer: Self-pay | Admitting: Urology

## 2021-11-03 ENCOUNTER — Encounter (HOSPITAL_COMMUNITY): Payer: Self-pay | Admitting: Emergency Medicine

## 2021-11-03 ENCOUNTER — Emergency Department (HOSPITAL_COMMUNITY)
Admission: EM | Admit: 2021-11-03 | Discharge: 2021-11-03 | Disposition: A | Discharge status: Home / Self Care | Payer: 59 | Attending: Emergency Medicine | Admitting: Emergency Medicine

## 2021-11-03 ENCOUNTER — Emergency Department (HOSPITAL_COMMUNITY): Payer: 59

## 2021-11-03 ENCOUNTER — Other Ambulatory Visit: Payer: Self-pay

## 2021-11-03 DIAGNOSIS — M7989 Other specified soft tissue disorders: Secondary | ICD-10-CM | POA: Diagnosis present

## 2021-11-03 DIAGNOSIS — R339 Retention of urine, unspecified: Secondary | ICD-10-CM | POA: Diagnosis not present

## 2021-11-03 DIAGNOSIS — R1900 Intra-abdominal and pelvic swelling, mass and lump, unspecified site: Secondary | ICD-10-CM | POA: Diagnosis not present

## 2021-11-03 DIAGNOSIS — R600 Localized edema: Secondary | ICD-10-CM | POA: Diagnosis not present

## 2021-11-03 DIAGNOSIS — Z7982 Long term (current) use of aspirin: Secondary | ICD-10-CM | POA: Insufficient documentation

## 2021-11-03 DIAGNOSIS — R0600 Dyspnea, unspecified: Secondary | ICD-10-CM | POA: Insufficient documentation

## 2021-11-03 DIAGNOSIS — R000 Tachycardia, unspecified: Secondary | ICD-10-CM | POA: Diagnosis not present

## 2021-11-03 DIAGNOSIS — R338 Other retention of urine: Secondary | ICD-10-CM

## 2021-11-03 LAB — CBC WITH DIFFERENTIAL/PLATELET
Abs Immature Granulocytes: 0.12 10*3/uL — ABNORMAL HIGH (ref 0.00–0.07)
Basophils Absolute: 0.1 10*3/uL (ref 0.0–0.1)
Basophils Relative: 1 %
Eosinophils Absolute: 0.1 10*3/uL (ref 0.0–0.5)
Eosinophils Relative: 1 %
HCT: 51.5 % (ref 39.0–52.0)
Hemoglobin: 16.9 g/dL (ref 13.0–17.0)
Immature Granulocytes: 1 %
Lymphocytes Relative: 15 %
Lymphs Abs: 1.7 10*3/uL (ref 0.7–4.0)
MCH: 28.3 pg (ref 26.0–34.0)
MCHC: 32.8 g/dL (ref 30.0–36.0)
MCV: 86.3 fL (ref 80.0–100.0)
Monocytes Absolute: 0.8 10*3/uL (ref 0.1–1.0)
Monocytes Relative: 7 %
Neutro Abs: 8.7 10*3/uL — ABNORMAL HIGH (ref 1.7–7.7)
Neutrophils Relative %: 75 %
Platelets: 253 10*3/uL (ref 150–400)
RBC: 5.97 MIL/uL — ABNORMAL HIGH (ref 4.22–5.81)
RDW: 14.4 % (ref 11.5–15.5)
WBC: 11.5 10*3/uL — ABNORMAL HIGH (ref 4.0–10.5)
nRBC: 0 % (ref 0.0–0.2)

## 2021-11-03 LAB — COMPREHENSIVE METABOLIC PANEL
ALT: 24 U/L (ref 0–44)
AST: 19 U/L (ref 15–41)
Albumin: 4.3 g/dL (ref 3.5–5.0)
Alkaline Phosphatase: 68 U/L (ref 38–126)
Anion gap: 9 (ref 5–15)
BUN: 15 mg/dL (ref 8–23)
CO2: 24 mmol/L (ref 22–32)
Calcium: 9.2 mg/dL (ref 8.9–10.3)
Chloride: 105 mmol/L (ref 98–111)
Creatinine, Ser: 1.62 mg/dL — ABNORMAL HIGH (ref 0.61–1.24)
GFR, Estimated: 47 mL/min — ABNORMAL LOW (ref >60)
Glucose, Bld: 103 mg/dL — ABNORMAL HIGH (ref 70–99)
Potassium: 4 mmol/L (ref 3.5–5.1)
Sodium: 138 mmol/L (ref 135–145)
Total Bilirubin: 0.6 mg/dL (ref 0.3–1.2)
Total Protein: 7.4 g/dL (ref 6.5–8.1)

## 2021-11-03 LAB — TROPONIN I (HIGH SENSITIVITY): Troponin I (High Sensitivity): 8 ng/L (ref <18)

## 2021-11-03 LAB — BRAIN NATRIURETIC PEPTIDE: B Natriuretic Peptide: 25.5 pg/mL (ref 0.0–100.0)

## 2021-11-03 NOTE — ED Provider Notes (Signed)
?[facility] DEPT ?Provider Note ? ? ?CSN: [id number] ?Arrival date & time: 11/03/21  0913 ? ?  ? ?History ? ?Chief Complaint  ?Patient presents with  ? Leg Swelling  ? ? ?Edwin [name] is a 66 y.o. male. ? ?HPI ?66 year old male presents with bilateral leg swelling. Patient is overall a poor historian. He's not sure how long it's been going on.  He had a health exam in his house yesterday and the nurse told him that his legs were swelling he should get evaluated.  He reports some dyspnea when asked and states that started this morning.  No chest pain.  He states he has chronic abdominal swelling that is unchanged and there is no pain.  No color change to his legs.  He denies any cardiac history. ? ?Home Medications ?Prior to Admission medications   ?Medication Sig Start Date End Date Taking? Authorizing Provider  ?acetaminophen (TYLENOL) 500 MG tablet Take 2 tablets (1,000 mg total) by mouth every 6 (six) hours as needed for moderate pain or headache. 08/14/17   Elodia Florence., MD  ?albuterol (PROVENTIL HFA;VENTOLIN HFA) 108 (90 Base) MCG/ACT inhaler Inhale 2 puffs into the lungs every 6 (six) hours as needed for wheezing or shortness of breath. 08/14/17   Elodia Florence., MD  ?aspirin EC 81 MG tablet Take 81 mg by mouth daily.    [provider]  ?benztropine (COGENTIN) 0.5 MG tablet Take 0.5 mg by mouth every morning. 12/13/14   [provider]  ?bethanechol (URECHOLINE) 10 MG tablet Take 10 mg by mouth 2 (two) times daily. 07/24/17   [provider]  ?bethanechol (URECHOLINE) 5 MG tablet TAKE 1 TABLET BY MOUTH TWICE A DAY 07/04/21   McKenzie, Candee Furbish, MD  ?clozapine (CLOZARIL) 200 MG tablet Take 600 mg by mouth at bedtime. 07/25/17   [provider]  ?finasteride (PROSCAR) 5 MG tablet TAKE 1 TABLET BY MOUTH EVERY DAY 05/17/20   McKenzie, Candee Furbish, MD  ?simvastatin (ZOCOR) 20 MG tablet Take 20 mg by mouth daily. 10/10/14   [provider]  ?tamsulosin (FLOMAX) 0.4 MG CAPS capsule TAKE 1 CAPSULE BY MOUTH TWICE A DAY 05/15/21   McKenzie, Candee Furbish, MD  ?temazepam (RESTORIL) 15 MG capsule Take 15 mg by mouth at bedtime. 06/14/16   [provider]  ?Vitamin D, Ergocalciferol, (DRISDOL) 50000 units CAPS capsule Take 50,000 Units by mouth once a week. 07/22/17   [provider]  ?VRAYLAR 4.5 MG CAPS Take 1 capsule by mouth every morning. 07/07/17   [provider]  ?   ? ?Allergies    ?Patient has no known allergies.   ? ?Review of Systems   ?Review of Systems  ?Respiratory:  Positive for shortness of breath.   ?Cardiovascular:  Positive for leg swelling. Negative for chest pain.  ?Gastrointestinal:  Negative for abdominal pain.  ?Skin:  Negative for color change.  ? ?Physical Exam ?Updated Vital Signs ?BP 131/87   Pulse 90   Temp 98.5 ?F (36.9 ?C)   Resp 20   Ht '5\' 11"'$  (1.803 m)   Wt 85 kg   SpO2 90%   BMI 26.14 kg/m?  ?Physical Exam ?Vitals and nursing note reviewed.  ?Constitutional:   ?   Appearance: He is well-developed. He is not ill-appearing or diaphoretic.  ?HENT:  ?   Head: Normocephalic and atraumatic.  ?Cardiovascular:  ?   Rate and Rhythm: Regular rhythm. Tachycardia present.  ?  Pulses:     ?     Dorsalis pedis pulses are 2+ on the right side and 2+ on the left side.  ?   Heart sounds: Normal heart sounds.  ?   Comments: HR~100 ?Pulmonary:  ?   Effort: Pulmonary effort is normal.  ?   Breath sounds: Normal breath sounds.  ?Abdominal:  ?   Palpations: Abdomen is soft.  ?   Tenderness: There is no abdominal tenderness.  ?Musculoskeletal:  ?   Right lower leg: Edema present.  ?   Left lower leg: Edema present.  ?   Comments: Pitting edema to BLE from feet to proximal lower legs.  There is some mild erythema consistent with stasis dermatitis ?  ?Skin: ?   General: Skin is warm and dry.  ?Neurological:  ?   Mental Status: He is alert.  ? ? ?ED Results / Procedures / Treatments   ?Labs ?(all labs  ordered are listed, but only abnormal results are displayed) ?Labs Reviewed  ?CBC WITH DIFFERENTIAL/PLATELET - Abnormal; Notable for the following components:  ?    Result Value  ? WBC 11.5 (*)   ? RBC 5.97 (*)   ? Neutro Abs 8.7 (*)   ? Abs Immature Granulocytes 0.12 (*)   ? All other components within normal limits  ?COMPREHENSIVE METABOLIC PANEL - Abnormal; Notable for the following components:  ? Glucose, Bld 103 (*)   ? Creatinine, Ser 1.62 (*)   ? GFR, Estimated 47 (*)   ? All other components within normal limits  ?BRAIN NATRIURETIC PEPTIDE  ?TROPONIN I (HIGH SENSITIVITY)  ? ? ?EKG ?EKG Interpretation ? ?Date/Time:  Friday November 03 2021 10:50:07 EDT ?Ventricular Rate:  92 ?PR Interval:  171 ?QRS Duration: 88 ?QT Interval:  361 ?QTC Calculation: 447 ?R Axis:   12 ?Text Interpretation: Sinus rhythm nonspecific changes similar to 2018 Confirmed by Sherwood Gambler [phone]) on 11/03/2021 10:52:22 AM ? ?Radiology ?DG Chest 2 View ? ?Result Date: 11/03/2021 ?CLINICAL DATA:  leg swelling, dyspnea EXAM: CHEST - 2 VIEW COMPARISON:  08/10/2017 chest radiograph. FINDINGS: Surgical hardware from ACDF overlies the lower cervical spine. Stable cardiomediastinal silhouette with normal heart size. No pneumothorax. No pleural effusion. Low lung volumes with mild streaky bibasilar lung opacities. IMPRESSION: Low lung volumes with mild streaky bibasilar lung opacities, favor atelectasis. Electronically Signed   By: Ilona Sorrel M.D.   On: 11/03/2021 11:16   ? ?Procedures ?Procedures  ? ? ?Medications Ordered in ED ?Medications - No data to display ? ?ED Course/ Medical Decision Making/ A&P ?  ?                        ?Medical Decision Making ?Amount and/or Complexity of Data Reviewed ?Labs: ordered. ?Radiology: ordered. ? ? ?My suspicion is patient has had leg swelling for a while but has not noticed or gotten it evaluated until someone pointed out yesterday during his visit.  He has a little bit of redness bilaterally that is more  consistent with stasis dermatitis rather than cellulitis bilaterally.  He reports a little bit of shortness of breath but has no increased work of breathing or rails.  Chest x-ray images viewed/interpreted by myself and there is no pneumonia or edema.  ECG without ischemia.  BNP is normal and creatinine is baseline with his chronic kidney disease.  He also has a history of prior prostate issues and has recurrent urinary retention here requiring a Foley catheter.  He  will need to be discharged with a leg bag.  He reports he is on tamsulosin and we will have him follow-up with his urologist.  Otherwise he can follow-up with PCP and elevate extremities and apply compression stockings. ? ? ? ? ? ? ? ?Final Clinical Impression(s) / ED Diagnoses ?Final diagnoses:  ?Bilateral lower extremity edema  ?Acute urinary retention  ? ? ?Rx / DC Orders ?ED Discharge Orders   ? ? None  ? ?  ? ? ?  ?Sherwood Gambler, MD ?11/03/21 1224 ? ?

## 2021-11-03 NOTE — ED Triage Notes (Signed)
Patient c/o swelling to lower legs and bilateral feet x 3 days. ?

## 2021-11-03 NOTE — Discharge Instructions (Signed)
If you develop new or worsening shortness of breath, chest pain, leg swelling, fever, or any other new/concerning symptoms then return to the ER for evaluation.  Otherwise be sure to elevate your legs and apply compression stockings to help with the leg swelling. ? ?You will need to follow-up with urology to have the Foley catheter removed next week. ?

## 2021-11-03 NOTE — ED Notes (Signed)
Pt c/o of not being able to urinate despite the strong urge to go.  Bladder scan performed noted >9100m in bladder.  Dr. GRegenia Skeetermade aware. ?

## 2021-11-07 ENCOUNTER — Inpatient Hospital Stay (HOSPITAL_COMMUNITY)
Admission: EM | Admit: 2021-11-07 | Discharge: 2021-11-16 | DRG: 698 | Disposition: A | Discharge status: Home Health | Payer: 59 | Attending: Family Medicine | Admitting: Family Medicine

## 2021-11-07 ENCOUNTER — Encounter (HOSPITAL_COMMUNITY): Payer: Self-pay

## 2021-11-07 DIAGNOSIS — G9341 Metabolic encephalopathy: Secondary | ICD-10-CM | POA: Diagnosis present

## 2021-11-07 DIAGNOSIS — T83511A Infection and inflammatory reaction due to indwelling urethral catheter, initial encounter: Principal | ICD-10-CM | POA: Diagnosis present

## 2021-11-07 DIAGNOSIS — F32A Depression, unspecified: Secondary | ICD-10-CM | POA: Diagnosis present

## 2021-11-07 DIAGNOSIS — N400 Enlarged prostate without lower urinary tract symptoms: Secondary | ICD-10-CM | POA: Diagnosis present

## 2021-11-07 DIAGNOSIS — A4159 Other Gram-negative sepsis: Secondary | ICD-10-CM | POA: Diagnosis present

## 2021-11-07 DIAGNOSIS — E876 Hypokalemia: Secondary | ICD-10-CM

## 2021-11-07 DIAGNOSIS — N320 Bladder-neck obstruction: Secondary | ICD-10-CM | POA: Diagnosis present

## 2021-11-07 DIAGNOSIS — Z8619 Personal history of other infectious and parasitic diseases: Secondary | ICD-10-CM

## 2021-11-07 DIAGNOSIS — N136 Pyonephrosis: Secondary | ICD-10-CM | POA: Diagnosis present

## 2021-11-07 DIAGNOSIS — T83021A Displacement of indwelling urethral catheter, initial encounter: Secondary | ICD-10-CM | POA: Diagnosis not present

## 2021-11-07 DIAGNOSIS — E785 Hyperlipidemia, unspecified: Secondary | ICD-10-CM | POA: Diagnosis present

## 2021-11-07 DIAGNOSIS — R140 Abdominal distension (gaseous): Secondary | ICD-10-CM | POA: Diagnosis present

## 2021-11-07 DIAGNOSIS — R739 Hyperglycemia, unspecified: Secondary | ICD-10-CM

## 2021-11-07 DIAGNOSIS — K219 Gastro-esophageal reflux disease without esophagitis: Secondary | ICD-10-CM | POA: Diagnosis present

## 2021-11-07 DIAGNOSIS — A419 Sepsis, unspecified organism: Secondary | ICD-10-CM | POA: Diagnosis not present

## 2021-11-07 DIAGNOSIS — J9601 Acute respiratory failure with hypoxia: Secondary | ICD-10-CM | POA: Diagnosis not present

## 2021-11-07 DIAGNOSIS — Y738 Miscellaneous gastroenterology and urology devices associated with adverse incidents, not elsewhere classified: Secondary | ICD-10-CM | POA: Diagnosis not present

## 2021-11-07 DIAGNOSIS — Z9079 Acquired absence of other genital organ(s): Secondary | ICD-10-CM

## 2021-11-07 DIAGNOSIS — R32 Unspecified urinary incontinence: Secondary | ICD-10-CM | POA: Diagnosis present

## 2021-11-07 DIAGNOSIS — G9340 Encephalopathy, unspecified: Secondary | ICD-10-CM | POA: Diagnosis present

## 2021-11-07 DIAGNOSIS — Z20822 Contact with and (suspected) exposure to covid-19: Secondary | ICD-10-CM | POA: Diagnosis present

## 2021-11-07 DIAGNOSIS — Z803 Family history of malignant neoplasm of breast: Secondary | ICD-10-CM

## 2021-11-07 DIAGNOSIS — F419 Anxiety disorder, unspecified: Secondary | ICD-10-CM | POA: Diagnosis present

## 2021-11-07 DIAGNOSIS — K5900 Constipation, unspecified: Secondary | ICD-10-CM | POA: Diagnosis not present

## 2021-11-07 DIAGNOSIS — Z7982 Long term (current) use of aspirin: Secondary | ICD-10-CM

## 2021-11-07 DIAGNOSIS — B961 Klebsiella pneumoniae [K. pneumoniae] as the cause of diseases classified elsewhere: Secondary | ICD-10-CM | POA: Diagnosis present

## 2021-11-07 DIAGNOSIS — N1831 Chronic kidney disease, stage 3a: Secondary | ICD-10-CM | POA: Diagnosis present

## 2021-11-07 DIAGNOSIS — E875 Hyperkalemia: Secondary | ICD-10-CM | POA: Diagnosis not present

## 2021-11-07 DIAGNOSIS — R6520 Severe sepsis without septic shock: Secondary | ICD-10-CM | POA: Diagnosis present

## 2021-11-07 DIAGNOSIS — Z79899 Other long term (current) drug therapy: Secondary | ICD-10-CM

## 2021-11-07 DIAGNOSIS — I2699 Other pulmonary embolism without acute cor pulmonale: Secondary | ICD-10-CM | POA: Diagnosis present

## 2021-11-07 DIAGNOSIS — N179 Acute kidney failure, unspecified: Secondary | ICD-10-CM | POA: Diagnosis present

## 2021-11-07 DIAGNOSIS — Z8546 Personal history of malignant neoplasm of prostate: Secondary | ICD-10-CM

## 2021-11-07 DIAGNOSIS — F17210 Nicotine dependence, cigarettes, uncomplicated: Secondary | ICD-10-CM | POA: Diagnosis present

## 2021-11-07 DIAGNOSIS — F200 Paranoid schizophrenia: Secondary | ICD-10-CM | POA: Diagnosis present

## 2021-11-07 DIAGNOSIS — J9811 Atelectasis: Secondary | ICD-10-CM | POA: Diagnosis present

## 2021-11-07 LAB — URINALYSIS, ROUTINE W REFLEX MICROSCOPIC
Bilirubin Urine: NEGATIVE
Glucose, UA: 50 mg/dL — AB
Ketones, ur: NEGATIVE mg/dL
Nitrite: NEGATIVE
Protein, ur: 100 mg/dL — AB
RBC / HPF: >50 RBC/hpf — ABNORMAL HIGH (ref 0–5)
Specific Gravity, Urine: 1.005 (ref 1.005–1.030)
pH: 7 (ref 5.0–8.0)

## 2021-11-07 LAB — COMPREHENSIVE METABOLIC PANEL
ALT: 27 U/L (ref 0–44)
AST: 20 U/L (ref 15–41)
Albumin: 4.2 g/dL (ref 3.5–5.0)
Alkaline Phosphatase: 80 U/L (ref 38–126)
Anion gap: 9 (ref 5–15)
BUN: 12 mg/dL (ref 8–23)
CO2: 24 mmol/L (ref 22–32)
Calcium: 9.3 mg/dL (ref 8.9–10.3)
Chloride: 106 mmol/L (ref 98–111)
Creatinine, Ser: 1.54 mg/dL — ABNORMAL HIGH (ref 0.61–1.24)
GFR, Estimated: 50 mL/min — ABNORMAL LOW (ref >60)
Glucose, Bld: 130 mg/dL — ABNORMAL HIGH (ref 70–99)
Potassium: 3.4 mmol/L — ABNORMAL LOW (ref 3.5–5.1)
Sodium: 139 mmol/L (ref 135–145)
Total Bilirubin: 0.5 mg/dL (ref 0.3–1.2)
Total Protein: 7.2 g/dL (ref 6.5–8.1)

## 2021-11-07 LAB — CBC WITH DIFFERENTIAL/PLATELET
Abs Immature Granulocytes: 0.08 10*3/uL — ABNORMAL HIGH (ref 0.00–0.07)
Basophils Absolute: 0.1 10*3/uL (ref 0.0–0.1)
Basophils Relative: 1 %
Eosinophils Absolute: 0.1 10*3/uL (ref 0.0–0.5)
Eosinophils Relative: 1 %
HCT: 51 % (ref 39.0–52.0)
Hemoglobin: 16.7 g/dL (ref 13.0–17.0)
Immature Granulocytes: 1 %
Lymphocytes Relative: 12 %
Lymphs Abs: 1.4 10*3/uL (ref 0.7–4.0)
MCH: 28 pg (ref 26.0–34.0)
MCHC: 32.7 g/dL (ref 30.0–36.0)
MCV: 85.6 fL (ref 80.0–100.0)
Monocytes Absolute: 0.7 10*3/uL (ref 0.1–1.0)
Monocytes Relative: 6 %
Neutro Abs: 9.4 10*3/uL — ABNORMAL HIGH (ref 1.7–7.7)
Neutrophils Relative %: 79 %
Platelets: 245 10*3/uL (ref 150–400)
RBC: 5.96 MIL/uL — ABNORMAL HIGH (ref 4.22–5.81)
RDW: 14.4 % (ref 11.5–15.5)
WBC: 11.8 10*3/uL — ABNORMAL HIGH (ref 4.0–10.5)
nRBC: 0 % (ref 0.0–0.2)

## 2021-11-07 LAB — LIPASE, BLOOD: Lipase: 35 U/L (ref 11–51)

## 2021-11-07 NOTE — ED Triage Notes (Addendum)
Pt reports pulling urinary catheter out by hisself and reports he is voiding blood.  ? ?Recent sepsis in ED.  ? ?Pt brought in by family.  ? ?Pt is poor historian and can not tell this RN why he had a catheter.  ? ?Ambulatory  ?Denies pain at this time.  ?

## 2021-11-07 NOTE — ED Provider Triage Note (Signed)
Emergency Medicine Provider Triage Evaluation Note ? ?Viona Gilmore , a 66 y.o. male  was evaluated in triage.  Pt complains of urinary catheter issues.  Being treated for prostate cancer by Urologist.  Had catheter placed on Friday to help him urinate.  Per pt's niece, pt has poor compliance with recommended treatments.  Removed catheter one hour ago because "it hurt".  Pain described as sharp and stinging.  Recent hx of sepsis due to urinary catheter.  Has not been taking care of catheter per niece.  Pt poor historian. ? ?Review of Systems  ?Positive: As above ?Negative: As above ? ?Physical Exam  ?BP (!) 143/88 (BP Location: Left Arm)   Pulse (!) 116   Temp 98.7 ?F (37.1 ?C) (Oral)   Resp 20   SpO2 90%  ?Gen:   Awake, no distress   ?Resp:  Normal effort, CTAB ?MSK:   Moves extremities without difficulty ?Other:  Mild suprapubic tenderness.  Tachycardic with respiratory rate of 18 on exam ? ?Medical Decision Making  ?Medically screening exam initiated at 2:28 PM.  Appropriate orders placed.  Viona Gilmore was informed that the remainder of the evaluation will be completed by another provider, this initial triage assessment does not replace that evaluation, and the importance of remaining in the ED until their evaluation is complete. ? ?Labs ordered ?  ?Prince Rome, PA-C ?96/29/52 1442 ? ?

## 2021-11-07 NOTE — ED Notes (Signed)
Niece, called and said to call her for any questions and for a ride home. She is an emergency contact listed for patient. ?

## 2021-11-08 ENCOUNTER — Other Ambulatory Visit: Payer: Self-pay

## 2021-11-08 ENCOUNTER — Encounter (HOSPITAL_COMMUNITY): Payer: Self-pay | Admitting: Internal Medicine

## 2021-11-08 ENCOUNTER — Inpatient Hospital Stay (HOSPITAL_COMMUNITY): Payer: 59

## 2021-11-08 ENCOUNTER — Emergency Department (HOSPITAL_COMMUNITY): Payer: 59

## 2021-11-08 DIAGNOSIS — K219 Gastro-esophageal reflux disease without esophagitis: Secondary | ICD-10-CM | POA: Diagnosis present

## 2021-11-08 DIAGNOSIS — R6520 Severe sepsis without septic shock: Secondary | ICD-10-CM | POA: Diagnosis present

## 2021-11-08 DIAGNOSIS — N401 Enlarged prostate with lower urinary tract symptoms: Secondary | ICD-10-CM | POA: Diagnosis not present

## 2021-11-08 DIAGNOSIS — Z8546 Personal history of malignant neoplasm of prostate: Secondary | ICD-10-CM | POA: Diagnosis not present

## 2021-11-08 DIAGNOSIS — T83511A Infection and inflammatory reaction due to indwelling urethral catheter, initial encounter: Secondary | ICD-10-CM | POA: Diagnosis present

## 2021-11-08 DIAGNOSIS — I2699 Other pulmonary embolism without acute cor pulmonale: Secondary | ICD-10-CM | POA: Diagnosis present

## 2021-11-08 DIAGNOSIS — I5021 Acute systolic (congestive) heart failure: Secondary | ICD-10-CM | POA: Diagnosis not present

## 2021-11-08 DIAGNOSIS — R609 Edema, unspecified: Secondary | ICD-10-CM | POA: Diagnosis not present

## 2021-11-08 DIAGNOSIS — E876 Hypokalemia: Secondary | ICD-10-CM

## 2021-11-08 DIAGNOSIS — N179 Acute kidney failure, unspecified: Secondary | ICD-10-CM | POA: Diagnosis present

## 2021-11-08 DIAGNOSIS — K5900 Constipation, unspecified: Secondary | ICD-10-CM | POA: Diagnosis not present

## 2021-11-08 DIAGNOSIS — E875 Hyperkalemia: Secondary | ICD-10-CM | POA: Diagnosis not present

## 2021-11-08 DIAGNOSIS — F17210 Nicotine dependence, cigarettes, uncomplicated: Secondary | ICD-10-CM | POA: Diagnosis present

## 2021-11-08 DIAGNOSIS — Z20822 Contact with and (suspected) exposure to covid-19: Secondary | ICD-10-CM | POA: Diagnosis present

## 2021-11-08 DIAGNOSIS — N320 Bladder-neck obstruction: Secondary | ICD-10-CM | POA: Diagnosis present

## 2021-11-08 DIAGNOSIS — F200 Paranoid schizophrenia: Secondary | ICD-10-CM | POA: Diagnosis present

## 2021-11-08 DIAGNOSIS — N1831 Chronic kidney disease, stage 3a: Secondary | ICD-10-CM | POA: Diagnosis present

## 2021-11-08 DIAGNOSIS — A419 Sepsis, unspecified organism: Secondary | ICD-10-CM | POA: Diagnosis present

## 2021-11-08 DIAGNOSIS — R739 Hyperglycemia, unspecified: Secondary | ICD-10-CM

## 2021-11-08 DIAGNOSIS — A4159 Other Gram-negative sepsis: Secondary | ICD-10-CM | POA: Diagnosis present

## 2021-11-08 DIAGNOSIS — G9340 Encephalopathy, unspecified: Secondary | ICD-10-CM

## 2021-11-08 DIAGNOSIS — N400 Enlarged prostate without lower urinary tract symptoms: Secondary | ICD-10-CM | POA: Diagnosis present

## 2021-11-08 DIAGNOSIS — F32A Depression, unspecified: Secondary | ICD-10-CM | POA: Diagnosis present

## 2021-11-08 DIAGNOSIS — J9811 Atelectasis: Secondary | ICD-10-CM | POA: Diagnosis present

## 2021-11-08 DIAGNOSIS — E785 Hyperlipidemia, unspecified: Secondary | ICD-10-CM | POA: Diagnosis present

## 2021-11-08 DIAGNOSIS — J9601 Acute respiratory failure with hypoxia: Secondary | ICD-10-CM | POA: Diagnosis not present

## 2021-11-08 DIAGNOSIS — T83021A Displacement of indwelling urethral catheter, initial encounter: Secondary | ICD-10-CM | POA: Diagnosis not present

## 2021-11-08 DIAGNOSIS — N136 Pyonephrosis: Secondary | ICD-10-CM | POA: Diagnosis present

## 2021-11-08 DIAGNOSIS — G9341 Metabolic encephalopathy: Secondary | ICD-10-CM | POA: Diagnosis present

## 2021-11-08 DIAGNOSIS — Y738 Miscellaneous gastroenterology and urology devices associated with adverse incidents, not elsewhere classified: Secondary | ICD-10-CM | POA: Diagnosis not present

## 2021-11-08 LAB — BLOOD CULTURE ID PANEL (REFLEXED) - BCID2

## 2021-11-08 LAB — LACTIC ACID, PLASMA
Lactic Acid, Venous: 4.6 mmol/L (ref 0.5–1.9)
Lactic Acid, Venous: 6.9 mmol/L (ref 0.5–1.9)
Lactic Acid, Venous: 4.3 mmol/L (ref 0.5–1.9)
Lactic Acid, Venous: 3.8 mmol/L (ref 0.5–1.9)
Lactic Acid, Venous: 3.8 mmol/L (ref 0.5–1.9)

## 2021-11-08 LAB — COMPREHENSIVE METABOLIC PANEL
ALT: 22 U/L (ref 0–44)
AST: 31 U/L (ref 15–41)
Albumin: 3.1 g/dL — ABNORMAL LOW (ref 3.5–5.0)
Alkaline Phosphatase: 59 U/L (ref 38–126)
Anion gap: 12 (ref 5–15)
BUN: 19 mg/dL (ref 8–23)
CO2: 20 mmol/L — ABNORMAL LOW (ref 22–32)
Calcium: 8.5 mg/dL — ABNORMAL LOW (ref 8.9–10.3)
Chloride: 108 mmol/L (ref 98–111)
Creatinine, Ser: 2.4 mg/dL — ABNORMAL HIGH (ref 0.61–1.24)
GFR, Estimated: 30 mL/min — ABNORMAL LOW (ref >60)
Glucose, Bld: 166 mg/dL — ABNORMAL HIGH (ref 70–99)
Potassium: 4.5 mmol/L (ref 3.5–5.1)
Sodium: 140 mmol/L (ref 135–145)
Total Bilirubin: 0.6 mg/dL (ref 0.3–1.2)
Total Protein: 5.8 g/dL — ABNORMAL LOW (ref 6.5–8.1)

## 2021-11-08 LAB — CBC WITH DIFFERENTIAL/PLATELET
Abs Immature Granulocytes: 0.82 10*3/uL — ABNORMAL HIGH (ref 0.00–0.07)
Basophils Absolute: 0.2 10*3/uL — ABNORMAL HIGH (ref 0.0–0.1)
Basophils Relative: 1 %
Eosinophils Absolute: 0 10*3/uL (ref 0.0–0.5)
Eosinophils Relative: 0 %
HCT: 46.9 % (ref 39.0–52.0)
Hemoglobin: 15.7 g/dL (ref 13.0–17.0)
Immature Granulocytes: 3 %
Lymphocytes Relative: 1 %
Lymphs Abs: 0.3 10*3/uL — ABNORMAL LOW (ref 0.7–4.0)
MCH: 28.6 pg (ref 26.0–34.0)
MCHC: 33.5 g/dL (ref 30.0–36.0)
MCV: 85.6 fL (ref 80.0–100.0)
Monocytes Absolute: 0.4 10*3/uL (ref 0.1–1.0)
Monocytes Relative: 1 %
Neutro Abs: 25 10*3/uL — ABNORMAL HIGH (ref 1.7–7.7)
Neutrophils Relative %: 94 %
Platelets: 141 10*3/uL — ABNORMAL LOW (ref 150–400)
RBC: 5.48 MIL/uL (ref 4.22–5.81)
RDW: 14.7 % (ref 11.5–15.5)
WBC: 26.6 10*3/uL — ABNORMAL HIGH (ref 4.0–10.5)
nRBC: 0 % (ref 0.0–0.2)

## 2021-11-08 LAB — RESP PANEL BY RT-PCR (FLU A&B, COVID) ARPGX2
Influenza A by PCR: NEGATIVE
Influenza B by PCR: NEGATIVE
SARS Coronavirus 2 by RT PCR: NEGATIVE

## 2021-11-08 LAB — MAGNESIUM: Magnesium: 1.6 mg/dL — ABNORMAL LOW (ref 1.7–2.4)

## 2021-11-08 LAB — PROTIME-INR
INR: 1.3 — ABNORMAL HIGH (ref 0.8–1.2)
Prothrombin Time: 15.7 seconds — ABNORMAL HIGH (ref 11.4–15.2)

## 2021-11-08 LAB — APTT: aPTT: 31 seconds (ref 24–36)

## 2021-11-08 LAB — TYPE AND SCREEN
ABO/RH(D): A POS
Antibody Screen: NEGATIVE

## 2021-11-08 LAB — MRSA NEXT GEN BY PCR, NASAL: MRSA by PCR Next Gen: NOT DETECTED

## 2021-11-08 LAB — HEMOGLOBIN AND HEMATOCRIT, BLOOD
HCT: 44.9 % (ref 39.0–52.0)
Hemoglobin: 14.8 g/dL (ref 13.0–17.0)

## 2021-11-08 LAB — HEMOGLOBIN A1C
Hgb A1c MFr Bld: 6.8 % — ABNORMAL HIGH (ref 4.8–5.6)
Mean Plasma Glucose: 148.46 mg/dL

## 2021-11-08 LAB — BRAIN NATRIURETIC PEPTIDE: B Natriuretic Peptide: 396.4 pg/mL — ABNORMAL HIGH (ref 0.0–100.0)

## 2021-11-08 LAB — PROCALCITONIN: Procalcitonin: 136.48 ng/mL

## 2021-11-08 MED ORDER — LACTATED RINGERS IV SOLN: INTRAVENOUS | Status: DC

## 2021-11-08 MED ORDER — LACTATED RINGERS IV BOLUS (SEPSIS)
2000.0000 mL | Freq: Once | INTRAVENOUS | Status: AC
  Administered 2021-11-08: 2000 mL via INTRAVENOUS

## 2021-11-08 MED ORDER — PREDNISONE 20 MG PO TABS
60.0000 mg | ORAL_TABLET | Freq: Once | ORAL | Status: AC
  Administered 2021-11-08: 60 mg via ORAL
  Filled 2021-11-08: qty 3

## 2021-11-08 MED ORDER — TEMAZEPAM 15 MG PO CAPS
15.0000 mg | ORAL_CAPSULE | Freq: Once | ORAL | Status: AC
  Administered 2021-11-08: 15 mg via ORAL
  Filled 2021-11-08: qty 1

## 2021-11-08 MED ORDER — VANCOMYCIN HCL 2000 MG/400ML IV SOLN
2000.0000 mg | Freq: Once | INTRAVENOUS | Status: AC
  Administered 2021-11-08: 2000 mg via INTRAVENOUS
  Filled 2021-11-08: qty 400

## 2021-11-08 MED ORDER — SODIUM CHLORIDE 0.9 % IV SOLN
2.0000 g | INTRAVENOUS | Status: DC
  Administered 2021-11-08 – 2021-11-09 (×2): 2 g via INTRAVENOUS
  Filled 2021-11-08 (×2): qty 20

## 2021-11-08 MED ORDER — IPRATROPIUM-ALBUTEROL 0.5-2.5 (3) MG/3ML IN SOLN
3.0000 mL | Freq: Once | RESPIRATORY_TRACT | Status: AC
  Administered 2021-11-08: 3 mL via RESPIRATORY_TRACT
  Filled 2021-11-08: qty 3

## 2021-11-08 MED ORDER — VANCOMYCIN HCL 1250 MG/250ML IV SOLN
1250.0000 mg | INTRAVENOUS | Status: DC
  Filled 2021-11-08: qty 250

## 2021-11-08 MED ORDER — ONDANSETRON HCL 4 MG/2ML IJ SOLN: 4.0000 mg | Freq: Four times a day (QID) | INTRAMUSCULAR | Status: DC | PRN

## 2021-11-08 MED ORDER — ACETAMINOPHEN 650 MG RE SUPP: 650.0000 mg | Freq: Four times a day (QID) | RECTAL | Status: DC | PRN

## 2021-11-08 MED ORDER — POTASSIUM CHLORIDE CRYS ER 20 MEQ PO TBCR
40.0000 meq | EXTENDED_RELEASE_TABLET | Freq: Once | ORAL | Status: AC
  Administered 2021-11-08: 40 meq via ORAL
  Filled 2021-11-08: qty 2

## 2021-11-08 MED ORDER — ONDANSETRON HCL 4 MG PO TABS: 4.0000 mg | ORAL_TABLET | Freq: Four times a day (QID) | ORAL | Status: DC | PRN

## 2021-11-08 MED ORDER — CHLORHEXIDINE GLUCONATE CLOTH 2 % EX PADS
6.0000 | MEDICATED_PAD | Freq: Every day | CUTANEOUS | Status: DC
  Administered 2021-11-08 – 2021-11-14 (×7): 6 via TOPICAL

## 2021-11-08 MED ORDER — LACTATED RINGERS IV BOLUS
1000.0000 mL | Freq: Once | INTRAVENOUS | Status: AC
  Administered 2021-11-08: 1000 mL via INTRAVENOUS

## 2021-11-08 MED ORDER — ACETAMINOPHEN 325 MG PO TABS
650.0000 mg | ORAL_TABLET | Freq: Four times a day (QID) | ORAL | Status: DC | PRN
  Administered 2021-11-08: 650 mg via ORAL
  Filled 2021-11-08: qty 2

## 2021-11-08 MED ORDER — MAGNESIUM SULFATE 2 GM/50ML IV SOLN
2.0000 g | Freq: Once | INTRAVENOUS | Status: AC
  Administered 2021-11-08: 2 g via INTRAVENOUS
  Filled 2021-11-08: qty 50

## 2021-11-08 NOTE — ED Notes (Signed)
Lactic 4.3, MD Marylyn Ishihara paged.  ?

## 2021-11-08 NOTE — Progress Notes (Signed)
?  Carryover admission to the Day Admitter.  I discussed this case with the EDP, Dr. Roxanne Mins.  Per these discussions: ? ? ?This is a 66 year old male with chronic indwelling Foley catheter who is being admitted for severe sepsis after being brought to [facility] emergency department for evaluation of confusion.  ? ?Patient reportedly confused over the last day, resulting in the patient accidentally pulling out his indwelling Foley catheter while inflated.  Foley catheter was reportedly replaced in the emergency department, with initially red-tinged urine subsequently clearing to a more pink appearance.  ? ?Vital signs notable for initial heart rate in the high 120s, subsequently decreasing into the low 120s following 1 L IVF bolus; initial blood pressure 80/60, improving to 89/62 following 1 L IV fluid bolus.  Mildly tachypneic, with O2 sats in the high 90s on room air. ? ?Urinalysis notable for 11-20 white blood cells and no squamous epithelial cells.  Per my discussions with the EDP, this is the presumed source of the patient's underlying infection at this time.  Chest x-ray reportedly showed no evidence of acute process.  No evidence of rash, or transaminitis.  ? ?CBC notable for white blood cell count of 11,800, initial hemoglobin 16.7 compared to 16.9 on 11/03/2021.  EKG reportedly shows sinus tachycardia without evidence of acute ischemic changes. ? ?Currently receiving 2 additional liters IVF bolus.  Lactic acid every 3 hours x2 ordered, with results currently pending.  Blood cultures x2 and urine culture collected with ensuing initiation of Rocephin.  ? ?I have tentatively accepted the patient for admission to stepdown unit for further evaluation /management of severe sepsis due to presumed urinary tract infection, but have asked EDP to consult pulm critical care as patient's blood pressures, while improving, remain low following initial liter of IV fluid bolus.  ? ?I have placed some additional preliminary  admit orders via the adult multi-morbid admission order set. I have also ordered continuous lactated Ringer's at 150 cc/h to be started following completion of aforementioned 3 L of LR bolus. Continue Rocephin and added IV vancomycin for severe sepsis without overwhelming underlying source of infection.  Add on procalcitonin. ? ?In the setting of patient's self removal of Foley catheter with initial red- tinged urine, I ordered stat repeat CBC in addition to INR, with results currently pending.  I have also ordered type and screen, as well as a repeat H&H scheduled for 9 AM this morning.  Refraining from DVT prophylaxis. Given reported interval improvement in initial red appearance of urine, I have not ordered continuous bladder irrigation at this time.  ? ? ? ?Babs Bertin, DO ?Hospitalist ? ?

## 2021-11-08 NOTE — Progress Notes (Signed)
Pharmacy Antibiotic Note ? ?Edwin [name] is a 66 y.o. male admitted on 11/07/2021 with sepsis.  Patient has indwelling foley catheter that is presumed source of infection.   ?Pharmacy has been consulted for Vancomycin dosing. ?AKI noted.  ? ?Plan: ?Vancomycin 2gm IV x1 now then '1250mg'$  IV q24h ?Check Vancomycin levels at steady state ?Monitor renal function and cx data  ? ? ?Height: '5\' 11"'$  (180.3 cm) ?Weight: 105.7 kg (233 lb) ?IBW/kg (Calculated) : 75.3 ? ?Temp (24hrs), Avg:97.9 ?F (36.6 ?C), Min:97.5 ?F (36.4 ?C), Max:98.7 ?F (37.1 ?C) ? ?Recent Labs  ?Lab 11/03/21 ?1048 11/07/21 ?1450 11/08/21 ?0400  ?WBC 11.5* 11.8* 26.6*  ?CREATININE 1.62* 1.54* 2.40*  ?LATICACIDVEN  --   --  4.6*  ?  ?Estimated Creatinine Clearance: 39 mL/min (A) (by C-G formula based on SCr of 2.4 mg/dL (H)).   ? ?No Known Allergies ? ?Antimicrobials this admission: ?4/12 Rocephin >>  ?4/12 Vancomycin >>  ? ?Dose adjustments this admission: ? ?Microbiology results: ?4/12 BCx:  ?4/12 UCx:   ? ?Thank you for allowing pharmacy to be a part of this patient?s care. ? ?Netta Cedars PharmD ?11/08/2021 4:44 AM ? ?

## 2021-11-08 NOTE — H&P (Addendum)
?History and Physical  ? ? ?Patient: Edwin [name] DOB: 25-Dec-1955 ?DOA: 11/07/2021 ?DOS: the patient was seen and examined on 11/08/2021 ?PCP: [facility]  ?Patient coming from: Home ? ?Chief Complaint: altered mental status ? ?HPI: Edwin [name] is a 66 y.o. male with medical history significant of schizophrenia, HLD, BPH/Prostate CA, CKD3a. Presenting with altered mental status. Limited history from patient and family. He came to the ED 5 days ago for urinary retention. A foley cath was placed and he was sent home with instructions to follow up with urology. Last night, the patient was confused and pulled his foley out. His family became concerned and brought him to the ED for evaluation. The patient is currently confused. When family was asked about any symptoms prior to his presentation, they are unable to report anything other than swollen legs.    ? ?Review of Systems: unable to review all systems due to the inability of the patient to answer questions. ?Past Medical History:  ?Diagnosis Date  ? Anxiety   ? Cancer [facility]   ? prostate  ? CKD (chronic kidney disease) stage 3, GFR 30-59 ml/min (HCC)   ? Depression   ? GERD (gastroesophageal reflux disease)   ? under control  ? History of Clostridium difficile colitis   ? 03-31-2015--  resolved  ? History of sepsis   ? secondary to UTI and ARF  x2 admission 03-31-2015 &  04-14-2015  ? Hyperlipidemia   ? Paranoid schizophrenia ([place])   ? Urinary incontinence   ? ?Past Surgical History:  ?Procedure Laterality Date  ? CERVICAL DISC SURGERY  1990's  ? CYSTOSCOPY N/A 03/08/2015  ? Procedure: CYSTOSCOPY;  Surgeon: Lowella Bandy, MD;  Location: [facility];  Service: Urology;  Laterality: N/A;  ? CYSTOSCOPY  06/10/2015  ? Procedure: CYSTOSCOPY;  Surgeon: Cleon Gustin, MD;  Location: [facility];  Service: Urology;;  ? PROSTATE BIOPSY N/A 03/08/2015  ? Procedure: BIOPSY TRANSRECTAL ULTRASONIC PROSTATE (TUBP);  Surgeon: Lowella Bandy, MD;  Location:  [facility];  Service: Urology;  Laterality: N/A;  ? TRANSTHORACIC ECHOCARDIOGRAM  06-11-2015  ? grade 1 diastolic dysfunction, ef 07-86%;  trivial TR  ? TRANSURETHRAL RESECTION OF BLADDER TUMOR N/A 03/08/2015  ? Procedure: TRANSURETHRAL RESECTION OF BLADDER TUMOR (TURBT);  Surgeon: Lowella Bandy, MD;  Location: [facility];  Service: Urology;  Laterality: N/A;  TUR BIOPSY BLADDER  ? TRANSURETHRAL RESECTION OF PROSTATE N/A 06/27/2015  ? Procedure: TRANSURETHRAL RESECTION OF THE PROSTATE WITH GYRUS INSTRUMENTS;  Surgeon: Cleon Gustin, MD;  Location: [facility];  Service: Urology;  Laterality: N/A;  ? TRANSURETHRAL RESECTION OF PROSTATE N/A 07/09/2016  ? Procedure: TRANSURETHRAL RESECTION OF THE PROSTATE (TURP);  Surgeon: Cleon Gustin, MD;  Location: [facility];  Service: Urology;  Laterality: N/A;  ? ?Social History:  reports that he has been smoking cigarettes. He has a 80.00 pack-year smoking history. He has never used smokeless tobacco. He reports that he does not drink alcohol and does not use drugs. ? ?No Known Allergies ? ?Family History  ?Problem Relation Age of Onset  ? Breast cancer Mother   ? ? ?Prior to Admission medications   ?Medication Sig Start Date End Date Taking? Authorizing Provider  ?acetaminophen (TYLENOL) 500 MG tablet Take 2 tablets (1,000 mg total) by mouth every 6 (six) hours as needed for moderate pain or headache. 08/14/17   Elodia Florence., MD  ?albuterol (PROVENTIL HFA;VENTOLIN HFA) 108 (90 Base) MCG/ACT  inhaler Inhale 2 puffs into the lungs every 6 (six) hours as needed for wheezing or shortness of breath. 08/14/17   Elodia Florence., MD  ?aspirin EC 81 MG tablet Take 81 mg by mouth daily.    [provider]  ?benztropine (COGENTIN) 0.5 MG tablet Take 0.5 mg by mouth every morning. 12/13/14   [provider]  ?bethanechol (URECHOLINE) 10 MG tablet Take 10 mg by mouth 2 (two) times daily. 07/24/17   [provider]   ?bethanechol (URECHOLINE) 5 MG tablet TAKE 1 TABLET BY MOUTH TWICE A DAY 07/04/21   McKenzie, Candee Furbish, MD  ?clozapine (CLOZARIL) 200 MG tablet Take 600 mg by mouth at bedtime. 07/25/17   [provider]  ?finasteride (PROSCAR) 5 MG tablet TAKE 1 TABLET BY MOUTH EVERY DAY 05/17/20   McKenzie, Candee Furbish, MD  ?simvastatin (ZOCOR) 20 MG tablet Take 20 mg by mouth daily. 10/10/14   [provider]  ?tamsulosin (FLOMAX) 0.4 MG CAPS capsule TAKE 1 CAPSULE BY MOUTH TWICE A DAY 05/15/21   McKenzie, Candee Furbish, MD  ?temazepam (RESTORIL) 15 MG capsule Take 15 mg by mouth at bedtime. 06/14/16   [provider]  ?Vitamin D, Ergocalciferol, (DRISDOL) 50000 units CAPS capsule Take 50,000 Units by mouth once a week. 07/22/17   [provider]  ?VRAYLAR 4.5 MG CAPS Take 1 capsule by mouth every morning. 07/07/17   [provider]  ? ? ?Physical Exam: ?Vitals:  ? 11/08/21 0430 11/08/21 0530 11/08/21 0615 11/08/21 0645  ?BP: 104/68 105/73 97/70 100/66  ?Pulse: (!) 123 (!) 118 (!) 123 (!) 118  ?Resp: (!) 39 (!) 32 (!) 24 (!) 42  ?Temp:      ?TempSrc:      ?SpO2: 98% 97% 98% 95%  ?Weight:      ?Height:      ? ?General: 66 y.o. male resting in bed in NAD ?Eyes: PERRL, normal sclera ?ENMT: Nares patent w/o discharge, orophaynx clear, dentition poor, ears w/o discharge/lesions/ulcers ?Neck: Supple, trachea midline ?Cardiovascular: tachy, +S1, S2, no m/g/r, equal pulses throughout ?Respiratory: CTABL, no w/r/r, normal WOB ?GI: BS+, NDNT, no masses noted, no organomegaly noted ?GU: foley cath in place ?MSK: No c/c; BLE edema ?Neuro: A&O x 3, no focal deficits ?Psyc: Flat affect, calm/cooperative, but still confused about his situation ? ?Data Reviewed: ? ?CO2  20 ?Glucose  166 ?SCr  2.40 ?Mg2+  1.6 ?Lactic 4.6 -> 6.9 ?WBC  26.6 ? ?CXR: No active disease.  Low lung volumes with left basilar atelectasis ? ?EKG: sinus tach, no st elevations ? ?Assessment and Plan: ?No notes have been filed under this  hospital service. ?Service: Hospitalist ?Sepsis w/ unknown source ?    - admitted to inpt, SDU ?    - continue vanc, rocephin ?    - CXR is negative ?    - check CT ab/pelvis ?    - follow Bld Cx, Ucx ?    - follow lactic acid ?    - fluids ? ?Acute on chronic metabolic encephalopathy ?    - treat above; follow for return to baseline mentation ? ?Urinary retention ?Hx of BPH ?Hx of prostate CA ?    - foley in place ?    - sediment in the urine ?    - right now, let's continue the foley; check CT ab/pelvis ?    - CT findings noted; spoke with urology, they will review his case, appreciate their assistance ? ?AKI  on CKD3a ?    - likely exacerbated by sepsis ?    - fluids, follow CT ab/pelvis as above ? ?Hypokalemia ?Hypomagnesemia ?    - K+ replaced, replace Mg2+ ? ?Hyperglycemia ?    - no previous history of DM ?    - check A1c ? ?HLD ?    - continue home regimen ? ?BLE edema ?    - not sure of the chronicity here ?    - check BNP, if elevated, check echo ?    - no lasix for now as his renal function and BP aren't where they need to be for that ?    - elevate legs, add TED hose ? ?Schizophrenia ?    - continue home regimen when confirmed ? ? Advance Care Planning:   Code Status: Full Code ? ?Consults: Urology ? ?Family Communication: w/ niece and brother by phone ? ?Severity of Illness: ?The appropriate patient status for this patient is INPATIENT. Inpatient status is judged to be reasonable and necessary in order to provide the required intensity of service to ensure the patient's safety. The patient's presenting symptoms, physical exam findings, and initial radiographic and laboratory data in the context of their chronic comorbidities is felt to place them at high risk for further clinical deterioration. Furthermore, it is not anticipated that the patient will be medically stable for discharge from the hospital within 2 midnights of admission.  ? ?* I certify that at the point of admission it is my clinical  judgment that the patient will require inpatient hospital care spanning beyond 2 midnights from the point of admission due to high intensity of service, high risk for further deterioration and high frequency of surv

## 2021-11-08 NOTE — ED Provider Notes (Signed)
?[facility] DEPT ?Provider Note ? ? ?CSN: [id number] ?Arrival date & time: 11/07/21  1327 ? ?  ? ?History ? ?Chief complaint: Pulled out Foley catheter ? ?Edwin [name] is a 66 y.o. male. ? ?The history is provided by the patient and a relative.  ?He has history of hyperlipidemia, paranoid schizophrenia, chronic kidney disease, prostate cancer and had Foley catheter placed 5 days ago.  Tonight, patient stated that he felt that the catheter was causing pain so he pulled it out without deflating the balloon. ?  ?Home Medications ?Prior to Admission medications   ?Medication Sig Start Date End Date Taking? Authorizing Provider  ?acetaminophen (TYLENOL) 500 MG tablet Take 2 tablets (1,000 mg total) by mouth every 6 (six) hours as needed for moderate pain or headache. 08/14/17   Elodia Florence., MD  ?albuterol (PROVENTIL HFA;VENTOLIN HFA) 108 (90 Base) MCG/ACT inhaler Inhale 2 puffs into the lungs every 6 (six) hours as needed for wheezing or shortness of breath. 08/14/17   Elodia Florence., MD  ?aspirin EC 81 MG tablet Take 81 mg by mouth daily.    [provider]  ?benztropine (COGENTIN) 0.5 MG tablet Take 0.5 mg by mouth every morning. 12/13/14   [provider]  ?bethanechol (URECHOLINE) 10 MG tablet Take 10 mg by mouth 2 (two) times daily. 07/24/17   [provider]  ?bethanechol (URECHOLINE) 5 MG tablet TAKE 1 TABLET BY MOUTH TWICE A DAY 07/04/21   McKenzie, Candee Furbish, MD  ?clozapine (CLOZARIL) 200 MG tablet Take 600 mg by mouth at bedtime. 07/25/17   [provider]  ?finasteride (PROSCAR) 5 MG tablet TAKE 1 TABLET BY MOUTH EVERY DAY 05/17/20   McKenzie, Candee Furbish, MD  ?simvastatin (ZOCOR) 20 MG tablet Take 20 mg by mouth daily. 10/10/14   [provider]  ?tamsulosin (FLOMAX) 0.4 MG CAPS capsule TAKE 1 CAPSULE BY MOUTH TWICE A DAY 05/15/21   McKenzie, Candee Furbish, MD  ?temazepam (RESTORIL) 15 MG capsule Take 15 mg by mouth at  bedtime. 06/14/16   [provider]  ?Vitamin D, Ergocalciferol, (DRISDOL) 50000 units CAPS capsule Take 50,000 Units by mouth once a week. 07/22/17   [provider]  ?VRAYLAR 4.5 MG CAPS Take 1 capsule by mouth every morning. 07/07/17   [provider]  ?   ? ?Allergies    ?Patient has no known allergies.   ? ?Review of Systems   ?Review of Systems  ?All other systems reviewed and are negative. ? ?Physical Exam ?Updated Vital Signs ?BP (!) 157/105   Pulse (!) 122   Temp (!) 97.5 ?F (36.4 ?C) (Oral)   Resp 18   SpO2 97%  ?Physical Exam ?Vitals and nursing note reviewed.  ?66 year old male, appears mildly dyspneic at rest, but is in no acute distress. Vital signs are significant for elevated heart rate, blood pressure. Oxygen saturation is 97%, which is normal. ?Head is normocephalic and atraumatic. PERRLA, EOMI. Oropharynx is clear. ?Neck is nontender and supple without adenopathy or JVD. ?Back is nontender and there is no CVA tenderness. ?Lungs have faint expiratory wheezes without rales or rhonchi. ?Chest is nontender. ?Heart has regular rate and rhythm without murmur. ?Abdomen is soft, flat, nontender. ?Genitalia: Uncircumcised penis, mild hypospadias with blood coming from the urethra. ?Extremities have 2+ edema, full range of motion is present. ?Skin is warm and dry without rash. ?Neurologic: Mental status is normal, cranial nerves are intact, moves all extremities equally. ? ?  ED Results / Procedures / Treatments   ?Labs ?(all labs ordered are listed, but only abnormal results are displayed) ?Labs Reviewed  ?CBC WITH DIFFERENTIAL/PLATELET - Abnormal; Notable for the following components:  ?    Result Value  ? WBC 11.8 (*)   ? RBC 5.96 (*)   ? Neutro Abs 9.4 (*)   ? Abs Immature Granulocytes 0.08 (*)   ? All other components within normal limits  ?COMPREHENSIVE METABOLIC PANEL - Abnormal; Notable for the following components:  ? Potassium 3.4 (*)   ? Glucose, Bld 130 (*)   ?  Creatinine, Ser 1.54 (*)   ? GFR, Estimated 50 (*)   ? All other components within normal limits  ?URINALYSIS, ROUTINE W REFLEX MICROSCOPIC - Abnormal; Notable for the following components:  ? Color, Urine RED (*)   ? APPearance HAZY (*)   ? Glucose, UA 50 (*)   ? Hgb urine dipstick MODERATE (*)   ? Protein, ur 100 (*)   ? Leukocytes,Ua SMALL (*)   ? RBC / HPF >50 (*)   ? Bacteria, UA RARE (*)   ? All other components within normal limits  ?URINE CULTURE  ?RESP PANEL BY RT-PCR (FLU A&B, COVID) [id number]  ?CULTURE, BLOOD (ROUTINE X 2)  ?CULTURE, BLOOD (ROUTINE X 2)  ?LIPASE, BLOOD  ?LACTIC ACID, PLASMA  ?LACTIC ACID, PLASMA  ?CBC WITH DIFFERENTIAL/PLATELET  ?PROTIME-INR  ?APTT  ? ? ?EKG ?EKG Interpretation ? ?Date/Time:  Wednesday November 08 2021 01:01:32 EDT ?Ventricular Rate:  127 ?PR Interval:  163 ?QRS Duration: 81 ?QT Interval:  280 ?QTC Calculation: 407 ?R Axis:   34 ?Text Interpretation: Sinus tachycardia Otherwise within normal limits When compared with ECG of 11/03/2021, HEART RATE has increased Confirmed by Delora Fuel ([id number]) on 11/08/2021 1:13:19 AM ? ?Radiology ?DG Chest Port 1 View ? ?Result Date: 11/08/2021 ?CLINICAL DATA:  Shortness of breath EXAM: PORTABLE CHEST 1 VIEW COMPARISON:  11/03/2021 FINDINGS: Hypoventilatory changes augmenting the cardiomediastinal silhouette and central bronchovascular markings. Subsegmental atelectasis left base. No consolidation, pleural effusion, or pneumothorax IMPRESSION: No active disease.  Low lung volumes with left basilar atelectasis Electronically Signed   By: Donavan Foil M.D.   On: 11/08/2021 01:17   ? ?Procedures ?Procedures  ?Cardiac monitor shows sinus tachycardia, per my interpretation. ? ?Medications Ordered in ED ?Medications  ?potassium chloride SA (KLOR-CON M) CR tablet 40 mEq (has no administration in time range)  ?ipratropium-albuterol (DUONEB) 0.5-2.5 (3) MG/3ML nebulizer solution 3 mL (has no administration in time range)  ?predniSONE (DELTASONE) tablet 60  mg (has no administration in time range)  ? ? ?ED Course/ Medical Decision Making/ A&P ?  ?                        ?Medical Decision Making ?Amount and/or Complexity of Data Reviewed ?Labs: ordered. ?Radiology: ordered. ?ECG/medicine tests: ordered. ? ?Risk ?Prescription drug management. ?Decision regarding hospitalization. ? ? ?Urethral injury from pulling out Foley catheter.  Bronchospasm.  Peripheral edema.  Labs show stable renal insufficiency, mild hypokalemia and he is given a dose of oral potassium.  Urinalysis is significant for greater than 50 RBCs.  We will check chest x-ray to rule out pneumonia, give albuterol with ipratropium via nebulizer for bronchospasm.  He will need to have Foley catheter replaced.  Old records are reviewed confirming placement of Foley catheter on 4/7.  We will check chest x-ray to rule out pneumonia.  Because of tachycardia, will also check ECG. ? ?  ECG shows sinus tachycardia without ST or T changes.  Chest x-ray does not show any evidence of pneumonia or other acute process.  I have independently viewed the image, and agree with the radiologist's interpretation.  Wheezing was improved following nebulizer treatment.  However, patient's blood pressure dropped into the 80s with persistent tachycardia.  He is given a liter of lactated Ringer's, but continues to be hypotensive and tachycardic.  I am concerned that he may be septic from pulling his Foley catheter out even though urine is not grossly infected.  He is started on the sepsis pathway and will continue additional fluids to give him a total of 30 mL/kg of lactated Ringer's.  Case is discussed with Dr. Velia Meyer of [facility], who agrees to admit the patient.  He has requested critical care consult.  Case is discussed with Dr. Ilda Mori of critical care service who agrees to see the patient in consultation. ? ?Sepsis labs have come back showing elevated lactic acid consistent with sepsis.  Also, there has been an acute  kidney injury with creatinine increased to 2.40 compared with 1.54 on arrival.  Repeat potassium is now normal. ? ?CRITICAL CARE ?Performed by: Delora Fuel ?Total critical care time: 105 minutes ?Critical care time was

## 2021-11-08 NOTE — Sepsis Progress Note (Signed)
Sepsis protocol is being followed by [facility]. 

## 2021-11-08 NOTE — ED Notes (Signed)
Lunch tray given. 

## 2021-11-08 NOTE — Consult Note (Signed)
Urology Consult ? ?Consulting MD: Cherylann Ratel, DO ? ?CC: Hydronephrosis, urinary retention ? ?HPI: ?This is a 66year old male seen regularly here in our practice with the following diagnoses: ? ?1.  BPH/bladder outlet obstruction with retention ?2.  History of prostate cancer, prior biopsy in 2016, low risk, on active surveillance ? ?He is admitted with altered mental status.  He initially presented less than a week ago with lower extremity edema.  Bladder scan revealed a residual urine volume of over 900 mL.  Foley catheter was placed.  It was recommended he follow-up with urology.  He was admitted this morning with altered mental status.  He remove the catheter himself yesterday.  He is admitted for further management.  He does have renal insufficiency.  Admission creatinine was 2.4.  Creatinine in our office approximately 2 months ago was 1.1. ? ?Evaluation included CT abdomen and pelvis revealing significant bilateral hydronephrosis.   ? ?Urologic consultation is requested ? ? ?PMH: ?Past Medical History:  ?Diagnosis Date  ? Anxiety   ? Cancer [facility])   ? prostate  ? CKD (chronic kidney disease) stage 3, GFR 30-59 ml/min (HCC)   ? Depression   ? GERD (gastroesophageal reflux disease)   ? under control  ? History of Clostridium difficile colitis   ? 03-31-2015--  resolved  ? History of sepsis   ? secondary to UTI and ARF  x2 admission 03-31-2015 &  04-14-2015  ? Hyperlipidemia   ? Paranoid schizophrenia ([place])   ? Urinary incontinence   ? ? ?PSH: ?Past Surgical History:  ?Procedure Laterality Date  ? CERVICAL DISC SURGERY  1990's  ? CYSTOSCOPY N/A 03/08/2015  ? Procedure: CYSTOSCOPY;  Surgeon: Lowella Bandy, MD;  Location: [facility];  Service: Urology;  Laterality: N/A;  ? CYSTOSCOPY  06/10/2015  ? Procedure: CYSTOSCOPY;  Surgeon: Cleon Gustin, MD;  Location: [facility];  Service: Urology;;  ? PROSTATE BIOPSY N/A 03/08/2015  ? Procedure: BIOPSY TRANSRECTAL ULTRASONIC PROSTATE (TUBP);  Surgeon: Lowella Bandy,  MD;  Location: [facility];  Service: Urology;  Laterality: N/A;  ? TRANSTHORACIC ECHOCARDIOGRAM  06-11-2015  ? grade 1 diastolic dysfunction, ef 71-06%;  trivial TR  ? TRANSURETHRAL RESECTION OF BLADDER TUMOR N/A 03/08/2015  ? Procedure: TRANSURETHRAL RESECTION OF BLADDER TUMOR (TURBT);  Surgeon: Lowella Bandy, MD;  Location: [facility];  Service: Urology;  Laterality: N/A;  TUR BIOPSY BLADDER  ? TRANSURETHRAL RESECTION OF PROSTATE N/A 06/27/2015  ? Procedure: TRANSURETHRAL RESECTION OF THE PROSTATE WITH GYRUS INSTRUMENTS;  Surgeon: Cleon Gustin, MD;  Location: [facility];  Service: Urology;  Laterality: N/A;  ? TRANSURETHRAL RESECTION OF PROSTATE N/A 07/09/2016  ? Procedure: TRANSURETHRAL RESECTION OF THE PROSTATE (TURP);  Surgeon: Cleon Gustin, MD;  Location: [facility];  Service: Urology;  Laterality: N/A;  ? ? ?Allergies: ?No Known Allergies ? ?Medications: ?Medications Prior to Admission  ?Medication Sig Dispense Refill Last Dose  ? aspirin EC 81 MG tablet Take 81 mg by mouth daily.   11/07/2021  ? benztropine (COGENTIN) 0.5 MG tablet Take 0.5 mg by mouth every morning.  4 11/07/2021  ? acetaminophen (TYLENOL) 500 MG tablet Take 2 tablets (1,000 mg total) by mouth every 6 (six) hours as needed for moderate pain or headache. (Patient not taking: Reported on 11/08/2021) 30 tablet 0 Not Taking  ? albuterol (PROVENTIL HFA;VENTOLIN HFA) 108 (90 Base) MCG/ACT inhaler Inhale 2 puffs into the lungs every 6 (six) hours as needed for wheezing or shortness  of breath. (Patient not taking: Reported on 11/08/2021) 1 Inhaler 1 Not Taking  ? bethanechol (URECHOLINE) 10 MG tablet Take 10 mg by mouth 2 (two) times daily.  11   ? bethanechol (URECHOLINE) 5 MG tablet TAKE 1 TABLET BY MOUTH TWICE A DAY (Patient taking differently: Take 5 mg by mouth in the morning and at bedtime.) 60 tablet 11   ? cloZAPine (CLOZARIL) 100 MG tablet Take 400 mg by mouth at bedtime.     ? clozapine (CLOZARIL) 200 MG tablet  Take 600 mg by mouth at bedtime.  5   ? finasteride (PROSCAR) 5 MG tablet TAKE 1 TABLET BY MOUTH EVERY DAY (Patient taking differently: Take 5 mg by mouth daily.) 90 tablet 3   ? simvastatin (ZOCOR) 20 MG tablet Take 20 mg by mouth daily.  3   ? tamsulosin (FLOMAX) 0.4 MG CAPS capsule TAKE 1 CAPSULE BY MOUTH TWICE A DAY (Patient taking differently: Take 0.4 mg by mouth in the morning and at bedtime.) 60 capsule 11   ? temazepam (RESTORIL) 15 MG capsule Take 15 mg by mouth at bedtime.  5   ? Vitamin D, Ergocalciferol, (DRISDOL) 50000 units CAPS capsule Take 50,000 Units by mouth once a week.  5   ? VRAYLAR 4.5 MG CAPS Take 1 capsule by mouth every morning.  5   ? VRAYLAR 6 MG CAPS Take 6 mg by mouth every morning.     ? ? ? ?Social History: ?Social History  ? ?Socioeconomic History  ? Marital status: Single  ?  Spouse name: Not on file  ? Number of children: Not on file  ? Years of education: Not on file  ? Highest education level: Not on file  ?Occupational History  ? Not on file  ?Tobacco Use  ? Smoking status: Every Day  ?  Packs/day: 2.00  ?  Years: 40.00  ?  Pack years: 80.00  ?  Types: Cigarettes  ? Smokeless tobacco: Never  ?Vaping Use  ? Vaping Use: Never used  ?Substance and Sexual Activity  ? Alcohol use: No  ? Drug use: No  ? Sexual activity: Not on file  ?Other Topics Concern  ? Not on file  ?Social History Narrative  ? Not on file  ? ?Social Determinants of Health  ? ?Financial Resource Strain: Not on file  ?Food Insecurity: Not on file  ?Transportation Needs: Not on file  ?Physical Activity: Not on file  ?Stress: Not on file  ?Social Connections: Not on file  ?Intimate Partner Violence: Not on file  ? ? ?Family History: ?Family History  ?Problem Relation Age of Onset  ? Breast cancer Mother   ? ? ?Review of Systems: ?Positive: Slow stream, urinary retention, urinary urgency, urgency incontinence ?Negative: A further 10 point review of systems was negative except what is listed in the HPI. ? ?Physical  Exam: ?'@VITALS2'$ @ ?General: No acute distress.  Awake.  Disheveled in appearance ?Head:  Normocephalic.  Atraumatic. ?ENT:  EOMI.  Mucous membranes moist ?Neck:  Supple.  No lymphadenopathy. ?CV:  Regular rate. ?Pulmonary: Equal effort bilaterally.   ? ? ?Studies: ? ?Recent Labs  ?  11/07/21 ?1450 11/08/21 ?0400 11/08/21 ?0904  ?HGB 16.7 15.7 14.8  ?WBC 11.8* 26.6*  --   ?PLT 245 141*  --   ? ? ?Recent Labs  ?  11/07/21 ?1450 11/08/21 ?0400  ?NA 139 140  ?K 3.4* 4.5  ?CL 106 108  ?CO2 24 20*  ?BUN 12 19  ?CREATININE  1.54* 2.40*  ?CALCIUM 9.3 8.5*  ?GFRNONAA 50* 30*  ?  ? ?Recent Labs  ?  11/08/21 ?0400  ?INR 1.3*  ?APTT 31  ?  ? ?Invalid input(s): ABG ? ? ? ?Assessment: BPH with retention, poorly functioning bladder with resultant bilateral hydronephrosis.  I do not think long-term we are going to have success with him voiding spontaneously.  He has had urodynamics revealing poor bladder contractility.  At this point, he will need proper emptying either by long-term Foley catheter placement or, less likely in this patient with metal illness, CIC ? ?Plan: ?1.  For now, he will need to be on Foley catheterization ? ?2.  He will need to come in our office with his primary urologist, Dr. Milford Cage, to discuss further management of his poorly emptying bladder.  More than likely, he will need Foley catheter placement long-term. ? ?3.  We will do our best to call to schedule his follow-up appointment. ? ?4.  If further urologic assistance needed during his hospitalization, please contact us ? ? ? ?Pager:[phone]  ? ?

## 2021-11-08 NOTE — Progress Notes (Signed)
PHARMACY - PHYSICIAN COMMUNICATION ?CRITICAL VALUE ALERT - BLOOD CULTURE IDENTIFICATION (BCID) ? ?Edwin [name] is an 66 y.o. male who presented to [facility] on 11/07/2021 with a chief complaint of urinary catheter pain.  ? ?Assessment:   ?Patient admitted with sepsis likely due to Indwelling Foley catheter/chronic urinary obstruction. ?4/4 Blood cx + GNR, BCID + Klebsiella pneumoniae, no resistance.  ? ?Name of physician (or Provider) Contacted: Clarene Essex ? ?Current antibiotics: Rocephin + Vancomycin ? ?Changes to prescribed antibiotics recommended:  ?D/C Vancomycin.  Continue Rocephin 2gm IV q24h. ?Recommendations accepted by provider ? ?Results for orders placed or performed during the hospital encounter of 11/07/21  ?Blood Culture ID Panel (Reflexed) (Collected: 11/08/2021  4:00 AM)  ?Result Value Ref Range  ? Enterococcus faecalis NOT DETECTED NOT DETECTED  ? Enterococcus Faecium NOT DETECTED NOT DETECTED  ? Listeria monocytogenes NOT DETECTED NOT DETECTED  ? Staphylococcus species NOT DETECTED NOT DETECTED  ? Staphylococcus aureus (BCID) NOT DETECTED NOT DETECTED  ? Staphylococcus epidermidis NOT DETECTED NOT DETECTED  ? Staphylococcus lugdunensis NOT DETECTED NOT DETECTED  ? Streptococcus species NOT DETECTED NOT DETECTED  ? Streptococcus agalactiae NOT DETECTED NOT DETECTED  ? Streptococcus pneumoniae NOT DETECTED NOT DETECTED  ? Streptococcus pyogenes NOT DETECTED NOT DETECTED  ? A.calcoaceticus-baumannii NOT DETECTED NOT DETECTED  ? Bacteroides fragilis NOT DETECTED NOT DETECTED  ? Enterobacterales DETECTED (A) NOT DETECTED  ? Enterobacter cloacae complex NOT DETECTED NOT DETECTED  ? Escherichia coli NOT DETECTED NOT DETECTED  ? Klebsiella aerogenes NOT DETECTED NOT DETECTED  ? Klebsiella oxytoca NOT DETECTED NOT DETECTED  ? Klebsiella pneumoniae DETECTED (A) NOT DETECTED  ? Proteus species NOT DETECTED NOT DETECTED  ? Salmonella species NOT DETECTED NOT DETECTED  ? Serratia marcescens NOT DETECTED NOT  DETECTED  ? Haemophilus influenzae NOT DETECTED NOT DETECTED  ? Neisseria meningitidis NOT DETECTED NOT DETECTED  ? Pseudomonas aeruginosa NOT DETECTED NOT DETECTED  ? Stenotrophomonas maltophilia NOT DETECTED NOT DETECTED  ? Candida albicans NOT DETECTED NOT DETECTED  ? Candida auris NOT DETECTED NOT DETECTED  ? Candida glabrata NOT DETECTED NOT DETECTED  ? Candida krusei NOT DETECTED NOT DETECTED  ? Candida parapsilosis NOT DETECTED NOT DETECTED  ? Candida tropicalis NOT DETECTED NOT DETECTED  ? Cryptococcus neoformans/gattii NOT DETECTED NOT DETECTED  ? CTX-M ESBL NOT DETECTED NOT DETECTED  ? Carbapenem resistance IMP NOT DETECTED NOT DETECTED  ? Carbapenem resistance KPC NOT DETECTED NOT DETECTED  ? Carbapenem resistance NDM NOT DETECTED NOT DETECTED  ? Carbapenem resist OXA 48 LIKE NOT DETECTED NOT DETECTED  ? Carbapenem resistance VIM NOT DETECTED NOT DETECTED  ? ? ?Netta Cedars PharmD ?11/08/2021  9:54 PM ? ?

## 2021-11-09 ENCOUNTER — Inpatient Hospital Stay (HOSPITAL_COMMUNITY): Payer: 59

## 2021-11-09 DIAGNOSIS — N179 Acute kidney failure, unspecified: Secondary | ICD-10-CM

## 2021-11-09 DIAGNOSIS — E876 Hypokalemia: Secondary | ICD-10-CM

## 2021-11-09 DIAGNOSIS — F200 Paranoid schizophrenia: Secondary | ICD-10-CM

## 2021-11-09 DIAGNOSIS — N1831 Chronic kidney disease, stage 3a: Secondary | ICD-10-CM

## 2021-11-09 DIAGNOSIS — T83021A Displacement of indwelling urethral catheter, initial encounter: Secondary | ICD-10-CM

## 2021-11-09 DIAGNOSIS — Z8546 Personal history of malignant neoplasm of prostate: Secondary | ICD-10-CM | POA: Diagnosis not present

## 2021-11-09 DIAGNOSIS — A419 Sepsis, unspecified organism: Secondary | ICD-10-CM | POA: Diagnosis not present

## 2021-11-09 LAB — COMPREHENSIVE METABOLIC PANEL WITH GFR
ALT: 24 U/L (ref 0–44)
AST: 27 U/L (ref 15–41)
Albumin: 3 g/dL — ABNORMAL LOW (ref 3.5–5.0)
Alkaline Phosphatase: 59 U/L (ref 38–126)
Anion gap: 6 (ref 5–15)
BUN: 27 mg/dL — ABNORMAL HIGH (ref 8–23)
CO2: 25 mmol/L (ref 22–32)
Calcium: 8.4 mg/dL — ABNORMAL LOW (ref 8.9–10.3)
Chloride: 111 mmol/L (ref 98–111)
Creatinine, Ser: 1.86 mg/dL — ABNORMAL HIGH (ref 0.61–1.24)
GFR, Estimated: 40 mL/min — ABNORMAL LOW
Glucose, Bld: 147 mg/dL — ABNORMAL HIGH (ref 70–99)
Potassium: 4.8 mmol/L (ref 3.5–5.1)
Sodium: 142 mmol/L (ref 135–145)
Total Bilirubin: 0.7 mg/dL (ref 0.3–1.2)
Total Protein: 5.8 g/dL — ABNORMAL LOW (ref 6.5–8.1)

## 2021-11-09 LAB — URINE CULTURE: Culture: 50000 — AB

## 2021-11-09 LAB — CBC
HCT: 45.5 % (ref 39.0–52.0)
Hemoglobin: 14.6 g/dL (ref 13.0–17.0)
MCH: 28 pg (ref 26.0–34.0)
MCHC: 32.1 g/dL (ref 30.0–36.0)
MCV: 87.2 fL (ref 80.0–100.0)
Platelets: 124 10*3/uL — ABNORMAL LOW (ref 150–400)
RBC: 5.22 MIL/uL (ref 4.22–5.81)
RDW: 15.3 % (ref 11.5–15.5)
WBC: 32.6 10*3/uL — ABNORMAL HIGH (ref 4.0–10.5)
nRBC: 0 % (ref 0.0–0.2)

## 2021-11-09 LAB — PROTIME-INR
INR: 1.2 (ref 0.8–1.2)
Prothrombin Time: 15.1 seconds (ref 11.4–15.2)

## 2021-11-09 LAB — CORTISOL-AM, BLOOD: Cortisol - AM: 7 ug/dL (ref 6.7–22.6)

## 2021-11-09 LAB — PROCALCITONIN: Procalcitonin: 84.35 ng/mL

## 2021-11-09 MED ORDER — ORAL CARE MOUTH RINSE
15.0000 mL | Freq: Two times a day (BID) | OROMUCOSAL | Status: DC
  Administered 2021-11-09 – 2021-11-16 (×14): 15 mL via OROMUCOSAL

## 2021-11-09 MED ORDER — CLOZAPINE 100 MG PO TABS
400.0000 mg | ORAL_TABLET | Freq: Every day | ORAL | Status: DC
  Administered 2021-11-09 – 2021-11-15 (×7): 400 mg via ORAL
  Filled 2021-11-09 (×8): qty 4

## 2021-11-09 MED ORDER — ASPIRIN EC 81 MG PO TBEC
81.0000 mg | DELAYED_RELEASE_TABLET | Freq: Every day | ORAL | Status: DC
  Administered 2021-11-09 – 2021-11-14 (×6): 81 mg via ORAL
  Filled 2021-11-09 (×6): qty 1

## 2021-11-09 MED ORDER — TAMSULOSIN HCL 0.4 MG PO CAPS
0.4000 mg | ORAL_CAPSULE | Freq: Every day | ORAL | Status: DC
  Administered 2021-11-09 – 2021-11-16 (×8): 0.4 mg via ORAL
  Filled 2021-11-09 (×8): qty 1

## 2021-11-09 MED ORDER — TEMAZEPAM 15 MG PO CAPS
15.0000 mg | ORAL_CAPSULE | Freq: Every day | ORAL | Status: DC
  Administered 2021-11-09 – 2021-11-15 (×7): 15 mg via ORAL
  Filled 2021-11-09 (×7): qty 1

## 2021-11-09 MED ORDER — ALBUTEROL SULFATE HFA 108 (90 BASE) MCG/ACT IN AERS: 2.0000 | INHALATION_SPRAY | Freq: Four times a day (QID) | RESPIRATORY_TRACT | Status: DC | PRN

## 2021-11-09 MED ORDER — FINASTERIDE 5 MG PO TABS
5.0000 mg | ORAL_TABLET | Freq: Every day | ORAL | Status: DC
Start: 2021-11-09 — End: 2021-11-16
  Administered 2021-11-09 – 2021-11-16 (×8): 5 mg via ORAL
  Filled 2021-11-09 (×8): qty 1

## 2021-11-09 MED ORDER — NICOTINE 7 MG/24HR TD PT24
7.0000 mg | MEDICATED_PATCH | Freq: Every day | TRANSDERMAL | Status: AC
  Administered 2021-11-10: 7 mg via TRANSDERMAL
  Filled 2021-11-09: qty 1

## 2021-11-09 MED ORDER — ZINC SULFATE 220 (50 ZN) MG PO CAPS
220.0000 mg | ORAL_CAPSULE | Freq: Every day | ORAL | Status: DC
  Administered 2021-11-09 – 2021-11-16 (×8): 220 mg via ORAL
  Filled 2021-11-09 (×8): qty 1

## 2021-11-09 MED ORDER — VITAMIN E 45 MG (100 UNIT) PO CAPS
400.0000 [IU] | ORAL_CAPSULE | Freq: Every day | ORAL | Status: DC
  Administered 2021-11-09 – 2021-11-16 (×8): 400 [IU] via ORAL
  Filled 2021-11-09 (×8): qty 4

## 2021-11-09 MED ORDER — ACETAMINOPHEN 500 MG PO TABS: 1000.0000 mg | ORAL_TABLET | Freq: Four times a day (QID) | ORAL | Status: DC | PRN

## 2021-11-09 MED ORDER — ADULT MULTIVITAMIN W/MINERALS CH
1.0000 | ORAL_TABLET | Freq: Every day | ORAL | Status: DC
  Administered 2021-11-09 – 2021-11-16 (×8): 1 via ORAL
  Filled 2021-11-09 (×8): qty 1

## 2021-11-09 MED ORDER — BETHANECHOL CHLORIDE 10 MG PO TABS
5.0000 mg | ORAL_TABLET | Freq: Two times a day (BID) | ORAL | Status: DC
  Administered 2021-11-09 – 2021-11-16 (×14): 5 mg via ORAL
  Filled 2021-11-09 (×4): qty 0.5
  Filled 2021-11-09: qty 1
  Filled 2021-11-09 (×10): qty 0.5

## 2021-11-09 MED ORDER — VITAMIN D 25 MCG (1000 UNIT) PO TABS
2000.0000 [IU] | ORAL_TABLET | Freq: Every day | ORAL | Status: DC
  Administered 2021-11-09 – 2021-11-16 (×8): 2000 [IU] via ORAL
  Filled 2021-11-09 (×8): qty 2

## 2021-11-09 MED ORDER — IPRATROPIUM-ALBUTEROL 0.5-2.5 (3) MG/3ML IN SOLN
3.0000 mL | Freq: Three times a day (TID) | RESPIRATORY_TRACT | Status: DC
  Administered 2021-11-10 – 2021-11-16 (×19): 3 mL via RESPIRATORY_TRACT
  Filled 2021-11-09 (×19): qty 3

## 2021-11-09 MED ORDER — CARIPRAZINE HCL 1.5 MG PO CAPS
6.0000 mg | ORAL_CAPSULE | Freq: Every morning | ORAL | Status: DC
  Administered 2021-11-09 – 2021-11-16 (×8): 6 mg via ORAL
  Filled 2021-11-09 (×8): qty 4

## 2021-11-09 MED ORDER — ALBUTEROL SULFATE (2.5 MG/3ML) 0.083% IN NEBU
2.5000 mg | INHALATION_SOLUTION | Freq: Four times a day (QID) | RESPIRATORY_TRACT | Status: DC | PRN
  Administered 2021-11-09: 2.5 mg via RESPIRATORY_TRACT
  Filled 2021-11-09: qty 3

## 2021-11-09 MED ORDER — ZINC GLUCONATE 50 MG PO TABS: 50.0000 mg | ORAL_TABLET | Freq: Every day | ORAL | Status: DC

## 2021-11-09 MED ORDER — ASCORBIC ACID 500 MG PO TABS
1000.0000 mg | ORAL_TABLET | Freq: Every day | ORAL | Status: DC
  Administered 2021-11-09 – 2021-11-16 (×8): 1000 mg via ORAL
  Filled 2021-11-09 (×8): qty 2

## 2021-11-09 MED ORDER — SODIUM CHLORIDE 0.9 % IV SOLN: INTRAVENOUS | Status: DC | PRN

## 2021-11-09 MED ORDER — SODIUM CHLORIDE 0.9 % IV SOLN
2.0000 g | Freq: Two times a day (BID) | INTRAVENOUS | Status: DC
  Administered 2021-11-09 (×2): 2 g via INTRAVENOUS
  Filled 2021-11-09 (×2): qty 12.5

## 2021-11-09 MED ORDER — BENZTROPINE MESYLATE 0.5 MG PO TABS
0.5000 mg | ORAL_TABLET | Freq: Every morning | ORAL | Status: DC
  Administered 2021-11-09 – 2021-11-16 (×8): 0.5 mg via ORAL
  Filled 2021-11-09 (×8): qty 1

## 2021-11-09 NOTE — Progress Notes (Signed)
[facility] ? ?PROGRESS NOTE ? ?Edwin [name] FGH:[id number] DOB: 07/06/1956 DOA: 11/07/2021 ?PCP: [facility] ? ? ?Brief HPI:   ?66 year old male with medical history of schizophrenia, hyperlipidemia, BPH/prostate cancer, CKD stage IIIa presented with altered mental status.  Patient came to ED 5 days ago for urinary retention.  A Foley catheter was placed and he was sent home with instructions to follow-up with urology.  Last night patient was confused and pulled his Foley out.  Family became concerned and brought him to the ED for further evaluation.  He was seen by urology.  He was found to have BPH with retention, poorly functioning bladder with resultant bilateral hydronephrosis.  Foley catheter was reinserted.  Patient started on IV antibiotics. ? ? ? ?Subjective  ? ?Patient seen and examined, no new complaints.  Blood culture is growing Klebsiella pneumoniae and Enterobacterales.  Urine culture growing 50,000 colonies of Klebsiella pneumoniae. ? ? Assessment/Plan:  ? ? ?Sepsis due to UTI ?-Urine culture growing Klebsiella pneumoniae, blood culture growing Klebsiella pneumonia and Enterobacterales ?-We will switch from IV ceftriaxone to IV cefepime till culture is resulted ?-WBC up to 32,000 today ?-Follow culture results ? ?Klebsiella pneumoniae/Enterobacterles bacteremia ?-Patient has bacteremia as above ?-Continue IV cefepime ? ?Urinary retention ?-Patient history of BPH, prostate cancer ?-Foley catheter placed per urology ?-CT abdomen/pelvis showed hydronephrosis ?-Patient to follow-up urology as outpatient ? ?Acute kidney injury on CKD stage IIIa ?-Secondary to hydronephrosis; Foley catheter reinserted ?-Today creatinine has improved to 1.86 ?-Follow BMP in am ? ?Schizophrenia ?-Stable ?-Continue home medications ? ? ? ?Medications ? ?  ? Chlorhexidine Gluconate Cloth  6 each Topical Daily  ? mouth rinse  15 mL Mouth Rinse BID  ? ? ? Data Reviewed:  ? ?CBG: ? ?No results for input(s): GLUCAP  in the last 168 hours. ? ?SpO2: 92 % ?O2 Flow Rate (L/min): 2 L/min  ? ? ?Vitals:  ? 11/09/21 0700 11/09/21 0802 11/09/21 1126 11/09/21 1200  ?BP: 95/66   102/71  ?Pulse: 82   90  ?Resp: (!) 23   (!) 25  ?Temp:  98.2 ?F (36.8 ?C) 98.1 ?F (36.7 ?C)   ?TempSrc:  Oral Oral   ?SpO2: 94%   92%  ?Weight:      ?Height:      ? ? ? ? ?Data Reviewed: ? ?Basic Metabolic Panel: ?Recent Labs  ?Lab 11/03/21 ?1048 11/07/21 ?1450 11/08/21 ?0400 11/09/21 ?0240  ?NA 138 139 140 142  ?K 4.0 3.4* 4.5 4.8  ?CL 105 106 108 111  ?CO2 24 24 20* 25  ?GLUCOSE 103* 130* 166* 147*  ?BUN '15 12 19 '$ 27*  ?CREATININE 1.62* 1.54* 2.40* 1.86*  ?CALCIUM 9.2 9.3 8.5* 8.4*  ?MG  --   --  1.6*  --   ? ? ?CBC: ?Recent Labs  ?Lab 11/03/21 ?1048 11/07/21 ?1450 11/08/21 ?0400 11/08/21 ?0904 11/09/21 ?0240  ?WBC 11.5* 11.8* 26.6*  --  32.6*  ?NEUTROABS 8.7* 9.4* 25.0*  --   --   ?HGB 16.9 16.7 15.7 14.8 14.6  ?HCT 51.5 51.0 46.9 44.9 45.5  ?MCV 86.3 85.6 85.6  --  87.2  ?PLT 253 245 141*  --  124*  ? ? ?LFT ?Recent Labs  ?Lab 11/03/21 ?1048 11/07/21 ?1450 11/08/21 ?0400 11/09/21 ?0240  ?AST '19 20 31 27  '$ ?ALT '24 27 22 24  '$ ?ALKPHOS 68 80 59 59  ?BILITOT 0.6 0.5 0.6 0.7  ?PROT 7.4 7.2 5.8* 5.8*  ?ALBUMIN 4.3 4.2 3.1* 3.0*  ? ?  ?  Antibiotics: ?Anti-infectives (From admission, onward)  ? ? Start     Dose/Rate Route Frequency Ordered Stop  ? 11/09/21 1200  ceFEPIme (MAXIPIME) 2 g in sodium chloride 0.9 % 100 mL IVPB       ? 2 g ?200 mL/hr over 30 Minutes Intravenous Every 12 hours 11/09/21 0901    ? 11/09/21 0600  vancomycin (VANCOREADY) IVPB 1250 mg/250 mL  Status:  Discontinued       ? 1,250 mg ?166.7 mL/hr over 90 Minutes Intravenous Every 24 hours 11/08/21 0456 11/08/21 2200  ? 11/08/21 0415  vancomycin (VANCOREADY) IVPB 2000 mg/400 mL       ? 2,000 mg ?200 mL/hr over 120 Minutes Intravenous  Once 11/08/21 0412 11/08/21 0657  ? 11/08/21 0345  cefTRIAXone (ROCEPHIN) 2 g in sodium chloride 0.9 % 100 mL IVPB  Status:  Discontinued       ? 2 g ?200 mL/hr over 30  Minutes Intravenous Every 24 hours 11/08/21 0344 11/09/21 0837  ? ?  ? ? ? ?DVT prophylaxis: SCDs ? ?Code Status: Full code ? ?Family Communication: No family at bedside ? ? ?CONSULTS urology ? ? ?Objective  ? ? ?Physical Examination: ? ?General-appears in no acute distress ?Heart-S1-S2, regular, no murmur auscultated ?Lungs-clear to auscultation bilaterally, no wheezing or crackles auscultated ?Abdomen-soft, nontender, no organomegaly ?Extremities-no edema in the lower extremities ?Neuro-alert, oriented x3, no focal deficit noted ? ? ?Status is: Inpatient: Sepsis due to UTI ? ? ? ?  ? ? ? ? ? ?[name] ?  ?[facility] ?If 7PM-7AM, please contact night-coverage at [email or web address], ?Office  [phone] ? ? ?11/09/2021, 1:11 PM  LOS: 1 day  ? ? ? ? ? ? ? ? ? ? ?  ?

## 2021-11-09 NOTE — Progress Notes (Signed)
Pharmacy Antibiotic Note ? ?LEMARIO Edwin [name] is a 66 y.o. male admitted on 11/07/2021 with sepsis.  Patient has indwelling foley catheter that is presumed source of infection. Blood cultures returned with 4/4 bottles Klebsiella pneumoniae as well as urine culture with the same. Patient was initially transitioned to ceftriaxone. Pharmacy now consulted for cefepime dosing. ? ?Plan: ?Cefepime 2 g IV q12h ? ? ?Height: '5\' 11"'$  (180.3 cm) ?Weight: 102.9 kg (226 lb 13.7 oz) ?IBW/kg (Calculated) : 75.3 ? ?Temp (24hrs), Avg:98.1 ?F (36.7 ?C), Min:97.8 ?F (36.6 ?C), Max:98.2 ?F (36.8 ?C) ? ?Recent Labs  ?Lab 11/03/21 ?1048 11/07/21 ?1450 11/08/21 ?0400 11/08/21 ?4010 11/08/21 ?0904 11/08/21 ?1420 11/08/21 ?1651 11/09/21 ?0240  ?WBC 11.5* 11.8* 26.6*  --   --   --   --  32.6*  ?CREATININE 1.62* 1.54* 2.40*  --   --   --   --  1.86*  ?LATICACIDVEN  --   --  4.6* 6.9* 4.3* 3.8* 3.8*  --   ? ?  ?Estimated Creatinine Clearance: 49.6 mL/min (A) (by C-G formula based on SCr of 1.86 mg/dL (H)).   ? ?No Known Allergies ? ?Antimicrobials this admission: ?4/13 Cefepime >> ?4/12 Rocephin >> 4/13 ?4/12 Vancomycin >> 4/12 ? ? ?Microbiology results: ?4/12 BCx: 4/4 bottles Klebsiella pneumoniae ?4/12 UCx: 50k Klebsiella pneumoniae ? ?Thank you for allowing pharmacy to be a part of this patientEdwins care. ? ?Tawnya Crook, PharmD, BCPS ?Clinical Pharmacist ?11/09/2021 9:03 AM ? ? ?

## 2021-11-10 ENCOUNTER — Inpatient Hospital Stay (HOSPITAL_COMMUNITY): Payer: 59

## 2021-11-10 DIAGNOSIS — R6520 Severe sepsis without septic shock: Secondary | ICD-10-CM | POA: Diagnosis not present

## 2021-11-10 DIAGNOSIS — G9340 Encephalopathy, unspecified: Secondary | ICD-10-CM | POA: Diagnosis not present

## 2021-11-10 DIAGNOSIS — I5021 Acute systolic (congestive) heart failure: Secondary | ICD-10-CM

## 2021-11-10 DIAGNOSIS — A419 Sepsis, unspecified organism: Secondary | ICD-10-CM | POA: Diagnosis not present

## 2021-11-10 DIAGNOSIS — N179 Acute kidney failure, unspecified: Secondary | ICD-10-CM | POA: Diagnosis not present

## 2021-11-10 LAB — ECHOCARDIOGRAM COMPLETE
Area-P 1/2: 4.61 cm2
Calc EF: 50.6 %
Height: 71 in
S' Lateral: 3.5 cm
Single Plane A2C EF: 44.9 %
Single Plane A4C EF: 56.8 %
Weight: 3629.65 oz

## 2021-11-10 LAB — CBC
HCT: 42.7 % (ref 39.0–52.0)
Hemoglobin: 14.2 g/dL (ref 13.0–17.0)
MCH: 28.6 pg (ref 26.0–34.0)
MCHC: 33.3 g/dL (ref 30.0–36.0)
MCV: 86.1 fL (ref 80.0–100.0)
Platelets: 126 10*3/uL — ABNORMAL LOW (ref 150–400)
RBC: 4.96 MIL/uL (ref 4.22–5.81)
RDW: 15.2 % (ref 11.5–15.5)
WBC: 22.5 10*3/uL — ABNORMAL HIGH (ref 4.0–10.5)
nRBC: 0 % (ref 0.0–0.2)

## 2021-11-10 LAB — COMPREHENSIVE METABOLIC PANEL
ALT: 23 U/L (ref 0–44)
AST: 24 U/L (ref 15–41)
Albumin: 2.9 g/dL — ABNORMAL LOW (ref 3.5–5.0)
Alkaline Phosphatase: 53 U/L (ref 38–126)
Anion gap: 9 (ref 5–15)
BUN: 33 mg/dL — ABNORMAL HIGH (ref 8–23)
CO2: 25 mmol/L (ref 22–32)
Calcium: 8.3 mg/dL — ABNORMAL LOW (ref 8.9–10.3)
Chloride: 110 mmol/L (ref 98–111)
Creatinine, Ser: 1.72 mg/dL — ABNORMAL HIGH (ref 0.61–1.24)
GFR, Estimated: 44 mL/min — ABNORMAL LOW (ref >60)
Glucose, Bld: 121 mg/dL — ABNORMAL HIGH (ref 70–99)
Potassium: 3.7 mmol/L (ref 3.5–5.1)
Sodium: 144 mmol/L (ref 135–145)
Total Bilirubin: 0.3 mg/dL (ref 0.3–1.2)
Total Protein: 5.5 g/dL — ABNORMAL LOW (ref 6.5–8.1)

## 2021-11-10 LAB — CULTURE, BLOOD (ROUTINE X 2)
Special Requests: ADEQUATE
Special Requests: ADEQUATE

## 2021-11-10 MED ORDER — PERFLUTREN LIPID MICROSPHERE
1.0000 mL | INTRAVENOUS | Status: AC | PRN
  Administered 2021-11-10: 2 mL via INTRAVENOUS
  Filled 2021-11-10: qty 10

## 2021-11-10 MED ORDER — CEFAZOLIN SODIUM-DEXTROSE 2-4 GM/100ML-% IV SOLN
2.0000 g | Freq: Three times a day (TID) | INTRAVENOUS | Status: AC
  Administered 2021-11-10 – 2021-11-14 (×14): 2 g via INTRAVENOUS
  Filled 2021-11-10 (×15): qty 100

## 2021-11-10 MED ORDER — HALOPERIDOL LACTATE 5 MG/ML IJ SOLN: 1.0000 mg | Freq: Four times a day (QID) | INTRAMUSCULAR | Status: DC | PRN

## 2021-11-10 MED ORDER — HALOPERIDOL LACTATE 5 MG/ML IJ SOLN
1.0000 mg | Freq: Once | INTRAMUSCULAR | Status: AC
  Administered 2021-11-10: 1 mg via INTRAMUSCULAR
  Filled 2021-11-10: qty 1

## 2021-11-10 MED ORDER — FUROSEMIDE 10 MG/ML IJ SOLN
40.0000 mg | Freq: Once | INTRAMUSCULAR | Status: AC
  Administered 2021-11-10: 40 mg via INTRAVENOUS
  Filled 2021-11-10: qty 4

## 2021-11-10 NOTE — Progress Notes (Signed)
[facility] ? ?PROGRESS NOTE ? ?Edwin [name] [id number]:[id number] DOB: Nov 28, 1955 DOA: 11/07/2021 ?PCP: Pa, [facility] ? ? ?Brief HPI:   ?66 year old male with medical history of schizophrenia, hyperlipidemia, BPH/prostate cancer, CKD stage IIIa presented with altered mental status.  Patient came to ED 5 days ago for urinary retention.  A Foley catheter was placed and he was sent home with instructions to follow-up with urology.  Last night patient was confused and pulled his Foley out.  Family became concerned and brought him to the ED for further evaluation.  He was seen by urology.  He was found to have BPH with retention, poorly functioning bladder with resultant bilateral hydronephrosis.  Foley catheter was reinserted.  Patient started on IV antibiotics. ? ? ? ?Subjective  ? ?Patient seen and examined, oxygen requirement went up last night.  Currently requiring 11 L/min HFNC.  Also confused this morning. ? ? Assessment/Plan:  ? ? ?Sepsis due to UTI ?-Urine culture growing Klebsiella pneumoniae, blood culture growing Klebsiella pneumonia and Enterobacterales ?-Blood culture and urine culture resulted, Klebsiella pneumoniae sensitive to cefazolin ?-WBC is down to 22,000 ?-We will switch cefepime to cefazolin today ? ?Acute hypoxemic respiratory failure ?-Chest x-ray shows mild left basilar atelectasis ?-BNP elevated 396 ?-Patient has been getting IV fluids for acute kidney injury ?-We will discontinue IV fluids and give 1 dose of Lasix 40 mg IV ?-Follow echo results, strict intake and output ? ?Klebsiella pneumoniae/Enterobacterles bacteremia ?-Patient has bacteremia as above ?-Continue IV cefazolin ? ?Urinary retention ?-Patient history of BPH, prostate cancer ?-Foley catheter placed per urology ?-CT abdomen/pelvis showed hydronephrosis ?-Patient to follow-up urology as outpatient ? ?Acute kidney injury on CKD stage IIIa ?-Secondary to hydronephrosis; Foley catheter reinserted ?-Today creatinine has  improved to 1.72 ?-Started on Lasix due to likely fluid overload ? ?Schizophrenia ?-Stable ?-Continue home medications ? ? ? ?Medications ? ?  ? vitamin C  1,000 mg Oral Daily  ? aspirin EC  81 mg Oral Daily  ? benztropine  0.5 mg Oral q morning  ? bethanechol  5 mg Oral BID  ? cariprazine  6 mg Oral q morning  ? Chlorhexidine Gluconate Cloth  6 each Topical Daily  ? cholecalciferol  2,000 Units Oral Daily  ? cloZAPine  400 mg Oral QHS  ? finasteride  5 mg Oral Daily  ? ipratropium-albuterol  3 mL Nebulization TID  ? mouth rinse  15 mL Mouth Rinse BID  ? multivitamin with minerals  1 tablet Oral Daily  ? nicotine  7 mg Transdermal Daily  ? tamsulosin  0.4 mg Oral Daily  ? temazepam  15 mg Oral QHS  ? vitamin E  400 Units Oral Daily  ? zinc sulfate  220 mg Oral Daily  ? ? ? Data Reviewed:  ? ?CBG: ? ?No results for input(s): GLUCAP in the last 168 hours. ? ?SpO2: (!) 88 % ?O2 Flow Rate (L/min): (S) 10 L/min  ? ? ?Vitals:  ? 11/10/21 1000 11/10/21 1100 11/10/21 1110 11/10/21 1200  ?BP: 122/69 124/69  127/74  ?Pulse: 85 86  85  ?Resp: (!) 31 (!) 29  (!) 27  ?Temp:   98.3 ?F (36.8 ?C)   ?TempSrc:   Oral   ?SpO2: 94% 93%  (!) 88%  ?Weight:      ?Height:      ? ? ? ? ?Data Reviewed: ? ?Basic Metabolic Panel: ?Recent Labs  ?Lab 11/07/21 ?1450 11/08/21 ?0400 11/09/21 ?0998 11/10/21 ?3382  ?NA 139 140 142  144  ?K 3.4* 4.5 4.8 3.7  ?CL 106 108 111 110  ?CO2 24 20* 25 25  ?GLUCOSE 130* 166* 147* 121*  ?BUN 12 19 27* 33*  ?CREATININE 1.54* 2.40* 1.86* 1.72*  ?CALCIUM 9.3 8.5* 8.4* 8.3*  ?MG  --  1.6*  --   --   ? ? ?CBC: ?Recent Labs  ?Lab 11/07/21 ?1450 11/08/21 ?0400 11/08/21 ?0904 11/09/21 ?2992 11/10/21 ?4268  ?WBC 11.8* 26.6*  --  32.6* 22.5*  ?NEUTROABS 9.4* 25.0*  --   --   --   ?HGB 16.7 15.7 14.8 14.6 14.2  ?HCT 51.0 46.9 44.9 45.5 42.7  ?MCV 85.6 85.6  --  87.2 86.1  ?PLT 245 141*  --  124* 126*  ? ? ?LFT ?Recent Labs  ?Lab 11/07/21 ?1450 11/08/21 ?0400 11/09/21 ?3419 11/10/21 ?6222  ?AST '20 31 27 24  '$ ?ALT '27 22 24 23   '$ ?ALKPHOS 80 59 59 53  ?BILITOT 0.5 0.6 0.7 0.3  ?PROT 7.2 5.8* 5.8* 5.5*  ?ALBUMIN 4.2 3.1* 3.0* 2.9*  ? ?  ?Antibiotics: ?Anti-infectives (From admission, onward)  ? ? Start     Dose/Rate Route Frequency Ordered Stop  ? 11/10/21 1115  ceFAZolin (ANCEF) IVPB 2g/100 mL premix       ? 2 g ?200 mL/hr over 30 Minutes Intravenous Every 8 hours 11/10/21 1022    ? 11/09/21 1200  ceFEPIme (MAXIPIME) 2 g in sodium chloride 0.9 % 100 mL IVPB  Status:  Discontinued       ? 2 g ?200 mL/hr over 30 Minutes Intravenous Every 12 hours 11/09/21 0901 11/10/21 1022  ? 11/09/21 0600  vancomycin (VANCOREADY) IVPB 1250 mg/250 mL  Status:  Discontinued       ? 1,250 mg ?166.7 mL/hr over 90 Minutes Intravenous Every 24 hours 11/08/21 0456 11/08/21 2200  ? 11/08/21 0415  vancomycin (VANCOREADY) IVPB 2000 mg/400 mL       ? 2,000 mg ?200 mL/hr over 120 Minutes Intravenous  Once 11/08/21 0412 11/08/21 0657  ? 11/08/21 0345  cefTRIAXone (ROCEPHIN) 2 g in sodium chloride 0.9 % 100 mL IVPB  Status:  Discontinued       ? 2 g ?200 mL/hr over 30 Minutes Intravenous Every 24 hours 11/08/21 0344 11/09/21 0837  ? ?  ? ? ? ?DVT prophylaxis: SCDs ? ?Code Status: Full code ? ?Family Communication: No family at bedside ? ? ?CONSULTS urology ? ? ?Objective  ? ? ?Physical Examination: ? ?General-appears confused ?Heart-S1-S2, regular, no murmur auscultated ?Lungs-clear to auscultation bilaterally, no wheezing or crackles auscultated ?Abdomen-soft, nontender, no organomegaly ?Extremities-no edema in the lower extremities ?Neuro-somnolent, but arousable to verbal stimuli, confused ? ? ?Status is: Inpatient: Sepsis due to UTI, acute hypoxemic respiratory failure ? ? ? ?  ? ? ? ? ? ?Edwin [name] ?  ?[facility] ?If 7PM-7AM, please contact night-coverage at [email or web address], ?Office  [phone] ? ? ?11/10/2021, 1:08 PM  LOS: 2 days  ? ? ? ? ? ? ? ? ? ? ?  ?

## 2021-11-10 NOTE — Progress Notes (Addendum)
? ? ?  OVERNIGHT PROGRESS REPORT ? ?Notified by RN for increased respiratory rate and anxiety of patient. ?This appears similar to previous evening. ?Patient is requiring approximately the same amount of oxygen as previous evening. ? ? ?Patient currently on 5 L nasal cannula O2, SPO2 93% +/-. ? ?Portable chest x-ray ordered due to tachypnea ?Partial result: ?  "FINDINGS: ?Cardiac shadow is stable. Lungs are well aerated bilaterally. Mild ?left basilar atelectasis is seen. No bony abnormality is noted. ?  ?IMPRESSION: ?Mild left basilar atelectasis. ?  ?  ?Electronically Signed ?  By: Inez Catalina M.D. ?  On: 11/10/2021 00:27" ? ?Upon speaking with patient he is able to answer orientation questions appropriately and converse, he states his "breathing is okay". ?When asked about anxiety he states "yes I am a little anxious". ?He denies paranoia.  ?He denies Hallucinations.  ? ?Patient endorses that he has had to have Haldol in the past for his anxiety in addition to his current medications. ?He also states that he has had regular "Haldol shots in the past" and that "Haldol helps me feel better" ? ?A lower dose Haldol is ordered currently. ?Tele-sitter is ordered currently as he has been redirectable so far. ? ?He expresses no other difficulties. ?  ? ?Gershon Cull MSNA MSN [id number]-AG ?Acute Care Nurse Practitioner ?[facility] ?[place] ? ? ? ?

## 2021-11-10 NOTE — Progress Notes (Signed)
?  Echocardiogram ?2D Echocardiogram has been performed. ? Edwin [name] ?11/10/2021, 9:48 AM ?

## 2021-11-11 ENCOUNTER — Inpatient Hospital Stay (HOSPITAL_COMMUNITY): Payer: 59

## 2021-11-11 DIAGNOSIS — N179 Acute kidney failure, unspecified: Secondary | ICD-10-CM | POA: Diagnosis not present

## 2021-11-11 DIAGNOSIS — A419 Sepsis, unspecified organism: Secondary | ICD-10-CM | POA: Diagnosis not present

## 2021-11-11 DIAGNOSIS — G9340 Encephalopathy, unspecified: Secondary | ICD-10-CM | POA: Diagnosis not present

## 2021-11-11 DIAGNOSIS — R6520 Severe sepsis without septic shock: Secondary | ICD-10-CM | POA: Diagnosis not present

## 2021-11-11 LAB — COMPREHENSIVE METABOLIC PANEL
ALT: 23 U/L (ref 0–44)
AST: 20 U/L (ref 15–41)
Albumin: 3 g/dL — ABNORMAL LOW (ref 3.5–5.0)
Alkaline Phosphatase: 67 U/L (ref 38–126)
Anion gap: 8 (ref 5–15)
BUN: 32 mg/dL — ABNORMAL HIGH (ref 8–23)
CO2: 28 mmol/L (ref 22–32)
Calcium: 8.8 mg/dL — ABNORMAL LOW (ref 8.9–10.3)
Chloride: 106 mmol/L (ref 98–111)
Creatinine, Ser: 1.7 mg/dL — ABNORMAL HIGH (ref 0.61–1.24)
GFR, Estimated: 45 mL/min — ABNORMAL LOW (ref >60)
Glucose, Bld: 115 mg/dL — ABNORMAL HIGH (ref 70–99)
Potassium: 3.6 mmol/L (ref 3.5–5.1)
Sodium: 142 mmol/L (ref 135–145)
Total Bilirubin: 0.6 mg/dL (ref 0.3–1.2)
Total Protein: 6.2 g/dL — ABNORMAL LOW (ref 6.5–8.1)

## 2021-11-11 LAB — CBC
HCT: 47.1 % (ref 39.0–52.0)
Hemoglobin: 15.3 g/dL (ref 13.0–17.0)
MCH: 28 pg (ref 26.0–34.0)
MCHC: 32.5 g/dL (ref 30.0–36.0)
MCV: 86.1 fL (ref 80.0–100.0)
Platelets: 141 10*3/uL — ABNORMAL LOW (ref 150–400)
RBC: 5.47 MIL/uL (ref 4.22–5.81)
RDW: 14.9 % (ref 11.5–15.5)
WBC: 14.2 10*3/uL — ABNORMAL HIGH (ref 4.0–10.5)
nRBC: 0 % (ref 0.0–0.2)

## 2021-11-11 MED ORDER — TECHNETIUM TO 99M ALBUMIN AGGREGATED
4.4000 | Freq: Once | INTRAVENOUS | Status: AC
  Administered 2021-11-11: 4.4 via INTRAVENOUS

## 2021-11-11 MED ORDER — HEPARIN BOLUS VIA INFUSION
2500.0000 [IU] | Freq: Once | INTRAVENOUS | Status: AC
  Administered 2021-11-12: 2500 [IU] via INTRAVENOUS
  Filled 2021-11-11: qty 2500

## 2021-11-11 MED ORDER — ENOXAPARIN SODIUM 40 MG/0.4ML IJ SOSY
40.0000 mg | PREFILLED_SYRINGE | INTRAMUSCULAR | Status: DC
Start: 2021-11-11 — End: 2021-11-11
  Administered 2021-11-11: 40 mg via SUBCUTANEOUS
  Filled 2021-11-11: qty 0.4

## 2021-11-11 MED ORDER — HEPARIN (PORCINE) 25000 UT/250ML-% IV SOLN
1900.0000 [IU]/h | INTRAVENOUS | Status: DC
  Administered 2021-11-12 (×2): 1600 [IU]/h via INTRAVENOUS
  Administered 2021-11-13: 1900 [IU]/h via INTRAVENOUS
  Filled 2021-11-11 (×3): qty 250

## 2021-11-11 MED ORDER — FUROSEMIDE 10 MG/ML IJ SOLN: 40.0000 mg | Freq: Once | INTRAMUSCULAR | Status: DC

## 2021-11-11 MED ORDER — FUROSEMIDE 10 MG/ML IJ SOLN
20.0000 mg | Freq: Once | INTRAMUSCULAR | Status: AC
Start: 2021-11-11 — End: 2021-11-11
  Administered 2021-11-11: 20 mg via INTRAVENOUS
  Filled 2021-11-11: qty 2

## 2021-11-11 NOTE — Progress Notes (Signed)
[facility] ? ?PROGRESS NOTE ? ?SKYE RODARTE VVO:[id number] DOB: [birth year] DOA: 11/07/2021 ?PCP: [facility], [facility] ? ? ?Brief HPI:   ?66 year old male with medical history of schizophrenia, hyperlipidemia, BPH/prostate cancer, CKD stage IIIa presented with altered mental status.  Patient came to ED 5 days ago for urinary retention.  A Foley catheter was placed and he was sent home with instructions to follow-up with urology.  Last night patient was confused and pulled his Foley out.  Family became concerned and brought him to the ED for further evaluation.  He was seen by urology.  He was found to have BPH with retention, poorly functioning bladder with resultant bilateral hydronephrosis.  Foley catheter was reinserted.  Patient started on IV antibiotics. ? ? ? ?Subjective  ? ?Patient seen and examined, mentally he is alert and oriented x3 today.  Answering questions appropriately.  Still requiring oxygen 8 L/min HFNC ? ? Assessment/Plan:  ? ? ?Sepsis due to UTI ?-Urine culture growing Klebsiella pneumoniae, blood culture growing Klebsiella pneumonia and Enterobacterales ?-Blood culture and urine culture resulted, Klebsiella pneumoniae sensitive to cefazolin ?-WBC is down to 14,000 ?-Cefepime was switched to cefazolin ? ? ?Acute hypoxemic respiratory failure ?-Chest x-ray shows mild left basilar atelectasis ?-BNP elevated 396 ?-Patient has been getting IV fluids for acute kidney injury.?  Fluid overload ?-IV fluids were discontinued and patient was given 1 dose of Lasix 40 mg IV with brisk diuresis ?-Echocardiogram showed EF of 50 to 55% ? ?Klebsiella pneumoniae/Enterobacterles bacteremia ?-Patient has bacteremia as above ?-Continue IV cefazolin ? ?Urinary retention ?-Patient history of BPH, prostate cancer ?-Foley catheter placed per urology ?-CT abdomen/pelvis showed hydronephrosis ?-Patient to follow-up urology as outpatient ? ?Acute kidney injury on CKD stage IIIa ?-Secondary to hydronephrosis; Foley  catheter reinserted ?-Today creatinine has improved to 1.70 ?-Started on Lasix due to likely fluid overload ? ?Schizophrenia ?-Stable ?-Continue home medications ? ? ? ?Medications ? ?  ? vitamin C  1,000 mg Oral Daily  ? aspirin EC  81 mg Oral Daily  ? benztropine  0.5 mg Oral q morning  ? bethanechol  5 mg Oral BID  ? cariprazine  6 mg Oral q morning  ? Chlorhexidine Gluconate Cloth  6 each Topical Daily  ? cholecalciferol  2,000 Units Oral Daily  ? cloZAPine  400 mg Oral QHS  ? finasteride  5 mg Oral Daily  ? ipratropium-albuterol  3 mL Nebulization TID  ? mouth rinse  15 mL Mouth Rinse BID  ? multivitamin with minerals  1 tablet Oral Daily  ? tamsulosin  0.4 mg Oral Daily  ? temazepam  15 mg Oral QHS  ? vitamin E  400 Units Oral Daily  ? zinc sulfate  220 mg Oral Daily  ? ? ? Data Reviewed:  ? ?CBG: ? ?No results for input(s): GLUCAP in the last 168 hours. ? ?SpO2: 93 % ?O2 Flow Rate (L/min): 8 L/min  ? ? ?Vitals:  ? 11/11/21 0700 11/11/21 0739 11/11/21 0800 11/11/21 0900  ?BP: 116/79  115/74 117/70  ?Pulse: 81  84 85  ?Resp: (!) 25  (!) 29 (!) 27  ?Temp:   (!) 97.4 ?F (36.3 ?C)   ?TempSrc:   Oral   ?SpO2: 94% 95% 94% 93%  ?Weight:      ?Height:      ? ? ? ? ?Data Reviewed: ? ?Basic Metabolic Panel: ?Recent Labs  ?Lab 11/07/21 ?1450 11/08/21 ?0400 11/09/21 ?2694 11/10/21 ?8546 11/11/21 ?0243  ?NA 139 140 142  144 142  ?K 3.4* 4.5 4.8 3.7 3.6  ?CL 106 108 111 110 106  ?CO2 24 20* '25 25 28  '$ ?GLUCOSE 130* 166* 147* 121* 115*  ?BUN 12 19 27* 33* 32*  ?CREATININE 1.54* 2.40* 1.86* 1.72* 1.70*  ?CALCIUM 9.3 8.5* 8.4* 8.3* 8.8*  ?MG  --  1.6*  --   --   --   ? ? ?CBC: ?Recent Labs  ?Lab 11/07/21 ?1450 11/08/21 ?0400 11/08/21 ?0904 11/09/21 ?8453 11/10/21 ?6468 11/11/21 ?0243  ?WBC 11.8* 26.6*  --  32.6* 22.5* 14.2*  ?NEUTROABS 9.4* 25.0*  --   --   --   --   ?HGB 16.7 15.7 14.8 14.6 14.2 15.3  ?HCT 51.0 46.9 44.9 45.5 42.7 47.1  ?MCV 85.6 85.6  --  87.2 86.1 86.1  ?PLT 245 141*  --  124* 126* 141*  ? ? ?LFT ?Recent Labs   ?Lab 11/07/21 ?1450 11/08/21 ?0400 11/09/21 ?0321 11/10/21 ?2248 11/11/21 ?0243  ?AST '20 31 27 24 20  '$ ?ALT '27 22 24 23 23  '$ ?ALKPHOS 80 59 59 53 67  ?BILITOT 0.5 0.6 0.7 0.3 0.6  ?PROT 7.2 5.8* 5.8* 5.5* 6.2*  ?ALBUMIN 4.2 3.1* 3.0* 2.9* 3.0*  ? ?  ?Antibiotics: ?Anti-infectives (From admission, onward)  ? ? Start     Dose/Rate Route Frequency Ordered Stop  ? 11/10/21 1115  ceFAZolin (ANCEF) IVPB 2g/100 mL premix       ? 2 g ?200 mL/hr over 30 Minutes Intravenous Every 8 hours 11/10/21 1022    ? 11/09/21 1200  ceFEPIme (MAXIPIME) 2 g in sodium chloride 0.9 % 100 mL IVPB  Status:  Discontinued       ? 2 g ?200 mL/hr over 30 Minutes Intravenous Every 12 hours 11/09/21 0901 11/10/21 1022  ? 11/09/21 0600  vancomycin (VANCOREADY) IVPB 1250 mg/250 mL  Status:  Discontinued       ? 1,250 mg ?166.7 mL/hr over 90 Minutes Intravenous Every 24 hours 11/08/21 0456 11/08/21 2200  ? 11/08/21 0415  vancomycin (VANCOREADY) IVPB 2000 mg/400 mL       ? 2,000 mg ?200 mL/hr over 120 Minutes Intravenous  Once 11/08/21 0412 11/08/21 0657  ? 11/08/21 0345  cefTRIAXone (ROCEPHIN) 2 g in sodium chloride 0.9 % 100 mL IVPB  Status:  Discontinued       ? 2 g ?200 mL/hr over 30 Minutes Intravenous Every 24 hours 11/08/21 0344 11/09/21 0837  ? ?  ? ? ? ?DVT prophylaxis: SCDs ? ?Code Status: Full code ? ?Family Communication: No family at bedside ? ? ?CONSULTS urology ? ? ?Objective  ? ? ?Physical Examination: ? ?General-appears in no acute distress ?Heart-S1-S2, regular, no murmur auscultated ?Lungs-scattered rhonchi bilaterally ?Abdomen-soft, nontender, no organomegaly ?Extremities-no edema in the lower extremities ?Neuro-alert, oriented x3, no focal deficit noted ? ? ?Status is: Inpatient: Sepsis due to UTI, acute hypoxemic respiratory failure ? ? ? ?  ? ? ? ? ? ?[name] ?  ?[facility] ?If 7PM-7AM, please contact night-coverage at [email or web address], ?Office  [phone] ? ? ?11/11/2021, 11:59 AM  LOS: 3 days  ? ? ? ? ? ? ? ? ? ? ?  ?

## 2021-11-11 NOTE — Progress Notes (Signed)
ANTICOAGULATION CONSULT NOTE - Initial Consult ? ?Pharmacy Consult for Heparin ?Indication: pulmonary embolus ? ?No Known Allergies ? ?Patient Measurements: ?Height: '5\' 11"'$  (180.3 cm) ?Weight: 102.9 kg (226 lb 13.7 oz) ?IBW/kg (Calculated) : 75.3 ?Heparin Dosing Weight: 96kg ? ? ?Vital Signs: ?Temp: 97.7 ?F (36.5 ?C) (04/15 2300) ?Temp Source: Axillary (04/15 2300) ?BP: 118/79 (04/15 2000) ?Pulse Rate: 85 (04/15 2200) ? ?Labs: ?Recent Labs  ?  11/09/21 ?3846 11/10/21 ?6599 11/11/21 ?0243  ?HGB 14.6 14.2 15.3  ?HCT 45.5 42.7 47.1  ?PLT 124* 126* 141*  ?LABPROT 15.1  --   --   ?INR 1.2  --   --   ?CREATININE 1.86* 1.72* 1.70*  ? ? ?Estimated Creatinine Clearance: 54.3 mL/min (A) (by C-G formula based on SCr of 1.7 mg/dL (H)). ? ? ?Medical History: ?Past Medical History:  ?Diagnosis Date  ? Anxiety   ? Cancer [facility])   ? prostate  ? CKD (chronic kidney disease) stage 3, GFR 30-59 ml/min (HCC)   ? Depression   ? GERD (gastroesophageal reflux disease)   ? under control  ? History of Clostridium difficile colitis   ? 03-31-2015--  resolved  ? History of sepsis   ? secondary to UTI and ARF  x2 admission 03-31-2015 &  04-14-2015  ? Hyperlipidemia   ? Paranoid schizophrenia ([place])   ? Urinary incontinence   ? ? ?Medications:  ?Infusions:  ? sodium chloride Stopped (11/11/21 1852)  ?  ceFAZolin (ANCEF) IV Stopped (11/11/21 1852)  ? heparin    ? ? ?Assessment: ?66 yo M admitted on 11/07/21 after pulling Foley catheter out at home due to severe pain.  He was septic and continues on antibiotics for Klebsiella pneumoniae bacteremia/UTI. ?He continues to have acute hypoxemic respiratory failure.  ?VQ scan today could not rule out PE, recommended confirmatory chest CT. ?Pharmacy consulted to start IV heparin.  ?Baseline labs: CBC- Hg WNL, pltc slightly low at 141 but improved since admission.  ?Scr elevated at 1.7, INR 1.2, aptt 31- WNL.  ?He has been on SCDs only due to thrombocytopenia at admission but was started on Lovenox '40mg'$  SQ  daily earlier today.  Dose given at 4/15 @ 2107.  ? ?Goal of Therapy:  ?Heparin level 0.3-0.7 units/ml ?Monitor platelets by anticoagulation protocol: Yes ?  ?Plan:  ?Give 2500 units bolus x 1 ?Start heparin infusion at 1600 units/hr ?Check anti-Xa level in 6 hours and daily while on heparin ?Continue to monitor H&H and platelets ? ?Edwin [name] ?11/11/2021,11:51 PM ? ? ?

## 2021-11-11 NOTE — Progress Notes (Signed)
Nuclear medicine on call tech notified of stat scan order. She will contact me for appointment time in the next hour or two if she is able to get medication required from pharmacy.  ?

## 2021-11-12 ENCOUNTER — Inpatient Hospital Stay (HOSPITAL_COMMUNITY): Payer: 59

## 2021-11-12 DIAGNOSIS — Z8546 Personal history of malignant neoplasm of prostate: Secondary | ICD-10-CM | POA: Diagnosis not present

## 2021-11-12 DIAGNOSIS — T83021A Displacement of indwelling urethral catheter, initial encounter: Secondary | ICD-10-CM

## 2021-11-12 DIAGNOSIS — R6520 Severe sepsis without septic shock: Secondary | ICD-10-CM | POA: Diagnosis not present

## 2021-11-12 DIAGNOSIS — I2699 Other pulmonary embolism without acute cor pulmonale: Secondary | ICD-10-CM

## 2021-11-12 DIAGNOSIS — N179 Acute kidney failure, unspecified: Secondary | ICD-10-CM | POA: Diagnosis not present

## 2021-11-12 DIAGNOSIS — A419 Sepsis, unspecified organism: Secondary | ICD-10-CM | POA: Diagnosis not present

## 2021-11-12 DIAGNOSIS — R609 Edema, unspecified: Secondary | ICD-10-CM

## 2021-11-12 LAB — HEPARIN LEVEL (UNFRACTIONATED)
Heparin Unfractionated: 0.45 IU/mL (ref 0.30–0.70)
Heparin Unfractionated: 0.26 IU/mL — ABNORMAL LOW (ref 0.30–0.70)
Heparin Unfractionated: 0.28 [IU]/mL — ABNORMAL LOW (ref 0.30–0.70)

## 2021-11-12 LAB — CBC
HCT: 48.9 % (ref 39.0–52.0)
Hemoglobin: 16.1 g/dL (ref 13.0–17.0)
MCH: 28.2 pg (ref 26.0–34.0)
MCHC: 32.9 g/dL (ref 30.0–36.0)
MCV: 85.6 fL (ref 80.0–100.0)
Platelets: 144 10*3/uL — ABNORMAL LOW (ref 150–400)
RBC: 5.71 MIL/uL (ref 4.22–5.81)
RDW: 14.6 % (ref 11.5–15.5)
WBC: 13.8 10*3/uL — ABNORMAL HIGH (ref 4.0–10.5)
nRBC: 0 % (ref 0.0–0.2)

## 2021-11-12 LAB — BASIC METABOLIC PANEL
Anion gap: 8 (ref 5–15)
BUN: 31 mg/dL — ABNORMAL HIGH (ref 8–23)
CO2: 26 mmol/L (ref 22–32)
Calcium: 8.7 mg/dL — ABNORMAL LOW (ref 8.9–10.3)
Chloride: 107 mmol/L (ref 98–111)
Creatinine, Ser: 1.59 mg/dL — ABNORMAL HIGH (ref 0.61–1.24)
GFR, Estimated: 48 mL/min — ABNORMAL LOW (ref >60)
Glucose, Bld: 108 mg/dL — ABNORMAL HIGH (ref 70–99)
Potassium: 3.7 mmol/L (ref 3.5–5.1)
Sodium: 141 mmol/L (ref 135–145)

## 2021-11-12 MED ORDER — BISACODYL 10 MG RE SUPP
10.0000 mg | Freq: Once | RECTAL | Status: AC
  Administered 2021-11-12: 10 mg via RECTAL
  Filled 2021-11-12: qty 1

## 2021-11-12 MED ORDER — POLYETHYLENE GLYCOL 3350 17 G PO PACK
17.0000 g | PACK | Freq: Two times a day (BID) | ORAL | Status: DC
  Administered 2021-11-12 – 2021-11-16 (×9): 17 g via ORAL
  Filled 2021-11-12 (×9): qty 1

## 2021-11-12 NOTE — Progress Notes (Addendum)
ANTICOAGULATION CONSULT NOTE - Follow Up Consult ? ?Pharmacy Consult for heparin  ?Indication:  suspected pulmonary embolus ? ?No Known Allergies ? ?Patient Measurements: ?Height: '5\' 11"'$  (180.3 cm) ?Weight: 95.1 kg (209 lb 10.5 oz) ?IBW/kg (Calculated) : 75.3 ?Heparin Dosing Weight: 95kg ? ?Vital Signs: ?Temp: 97.7 ?F (36.5 ?C) (04/16 1100) ?Temp Source: Oral (04/16 1100) ?BP: 109/68 (04/16 1500) ?Pulse Rate: 92 (04/16 1500) ? ?Labs: ?Recent Labs  ?  11/10/21 ?6333 11/11/21 ?0243 11/12/21 ?0228 11/12/21 ?0831 11/12/21 ?1503  ?HGB 14.2 15.3 16.1  --   --   ?HCT 42.7 47.1 48.9  --   --   ?PLT 126* 141* 144*  --   --   ?HEPARINUNFRC  --   --   --  0.45 0.26*  ?CREATININE 1.72* 1.70* 1.59*  --   --   ? ? ? ?Estimated Creatinine Clearance: 56 mL/min (A) (by C-G formula based on SCr of 1.59 mg/dL (H)). ? ? 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  He's currently being treated for klebsiella bacteremia/UTI.  SCDs utilized on admission due to thrombocytopenia. V/Q scan on 11/11/21 showed focal perfusion defect and "sequelae associated with pulmonary embolism cannot be excluded." Heparin drip started on 11/11/21 for suspected VTE. ? ?Today, 11/12/2021: ?- Confirmatory heparin level slightly low at 0.26 ?- Hgb ok, plts low at 144K but trending up ?- No bleeding documented, no issues with heparin infusing per RN ?- LE doppler ordered for 4/16 negative for DVT ? ?Goal of Therapy:  ?Heparin level 0.3-0.7 units/ml ?Monitor platelets by anticoagulation protocol: Yes ?  ?Plan:  ?- Increase heparin drip to 1750 units/hr ?- Check heparin level in 6 hours ?- Monitor for s/sx bleeding ?- Per TR[facility], plan to check CTA once renal function improved ? ?Dimple Nanas, PharmD ?11/12/2021 4:14 PM ? ? ?

## 2021-11-12 NOTE — Progress Notes (Signed)
[facility] ? ?PROGRESS NOTE ? ?JAISEAN MONTEFORTE ZOX:[id number] DOB: [birth year] DOA: 11/07/2021 ?PCP: Pa, [facility] ? ? ?Brief HPI:   ?66 year old male with medical history of schizophrenia, hyperlipidemia, BPH/prostate cancer, CKD stage IIIa presented with altered mental status.  Patient came to ED 5 days ago for urinary retention.  A Foley catheter was placed and he was sent home with instructions to follow-up with urology.  Last night patient was confused and pulled his Foley out.  Family became concerned and brought him to the ED for further evaluation.  He was seen by urology.  He was found to have BPH with retention, poorly functioning bladder with resultant bilateral hydronephrosis.  Foley catheter was reinserted.  Patient started on IV antibiotics. ? ? ? ?Subjective  ? ?Patient seen and examined, VQ scan obtained yesterday showed focal perfusion defect involving the superior segment of the left lower lobe.  Pulmonary embolism could not be ruled out. ? ? Assessment/Plan:  ? ? ?Sepsis due to UTI ?-Urine culture growing Klebsiella pneumoniae, blood culture growing Klebsiella pneumonia and Enterobacterales ?-Blood culture and urine culture resulted, Klebsiella pneumoniae sensitive to cefazolin ?-WBC is down to 14,000 ?-Cefepime was switched to cefazolin ? ? ?Acute hypoxemic respiratory failure ?-Likely from pulmonary embolism ?-Started on IV heparin ?-Chest x-ray shows mild left basilar atelectasis ?-BNP elevated 396 ?-Patient has been getting IV fluids for acute kidney injury.?  Fluid overload ?-IV fluids were discontinued and patient was given 1 dose of Lasix 40 mg IV with brisk diuresis ?-Was given additional Lasix 20 mg IV yesterday with good diuresis ?-Net -5.1 L ?-Echocardiogram showed EF of 50 to 55% with no right heart strain ?-Patient continues to require high flow nasal cannula, will consult pulmonology for further assistance ? ?Pulmonary embolism ?-Seen on VQ scan as above ?-Started on IV  heparin ?-Bilateral venous duplex of lower extremities negative for DVT ?-Echocardiogram showed EF of 50 to 55% with no right heart strain ? ?Klebsiella pneumoniae/Enterobacterles bacteremia ?-Patient has bacteremia as above ?-Continue IV cefazolin ? ?Urinary retention ?-Patient history of BPH, prostate cancer ?-Foley catheter placed per urology ?-CT abdomen/pelvis showed hydronephrosis ?-Patient to follow-up urology as outpatient ? ?Acute kidney injury on CKD stage IIIa ?-Secondary to hydronephrosis; Foley catheter reinserted ?-Today creatinine has improved to 1.59 ?-Started on Lasix due to likely fluid overload ? ?Schizophrenia ?-Stable ?-Continue home medications ? ? ? ?Medications ? ?  ? vitamin C  1,000 mg Oral Daily  ? aspirin EC  81 mg Oral Daily  ? benztropine  0.5 mg Oral q morning  ? bethanechol  5 mg Oral BID  ? bisacodyl  10 mg Rectal Once  ? cariprazine  6 mg Oral q morning  ? Chlorhexidine Gluconate Cloth  6 each Topical Daily  ? cholecalciferol  2,000 Units Oral Daily  ? cloZAPine  400 mg Oral QHS  ? finasteride  5 mg Oral Daily  ? ipratropium-albuterol  3 mL Nebulization TID  ? mouth rinse  15 mL Mouth Rinse BID  ? multivitamin with minerals  1 tablet Oral Daily  ? polyethylene glycol  17 g Oral BID  ? tamsulosin  0.4 mg Oral Daily  ? temazepam  15 mg Oral QHS  ? vitamin E  400 Units Oral Daily  ? zinc sulfate  220 mg Oral Daily  ? ? ? Data Reviewed:  ? ?CBG: ? ?No results for input(s): GLUCAP in the last 168 hours. ? ?SpO2: 92 % ?O2 Flow Rate (L/min): 4 L/min  ? ? ?  Vitals:  ? 11/12/21 0900 11/12/21 0930 11/12/21 1000 11/12/21 1100  ?BP:      ?Pulse: 82 83 82   ?Resp: (!) 27 (!) 26 18   ?Temp:    97.7 ?F (36.5 ?C)  ?TempSrc:    Oral  ?SpO2: 94% 91% 92%   ?Weight:      ?Height:      ? ? ? ? ?Data Reviewed: ? ?Basic Metabolic Panel: ?Recent Labs  ?Lab 11/08/21 ?0400 11/09/21 ?2197 11/10/21 ?5883 11/11/21 ?0243 11/12/21 ?0228  ?NA 140 142 144 142 141  ?K 4.5 4.8 3.7 3.6 3.7  ?CL 108 111 110 106 107   ?CO2 20* '25 25 28 26  '$ ?GLUCOSE 166* 147* 121* 115* 108*  ?BUN 19 27* 33* 32* 31*  ?CREATININE 2.40* 1.86* 1.72* 1.70* 1.59*  ?CALCIUM 8.5* 8.4* 8.3* 8.8* 8.7*  ?MG 1.6*  --   --   --   --   ? ? ?CBC: ?Recent Labs  ?Lab 11/07/21 ?1450 11/08/21 ?0400 11/08/21 ?0904 11/09/21 ?2549 11/10/21 ?8264 11/11/21 ?0243 11/12/21 ?0228  ?WBC 11.8* 26.6*  --  32.6* 22.5* 14.2* 13.8*  ?NEUTROABS 9.4* 25.0*  --   --   --   --   --   ?HGB 16.7 15.7 14.8 14.6 14.2 15.3 16.1  ?HCT 51.0 46.9 44.9 45.5 42.7 47.1 48.9  ?MCV 85.6 85.6  --  87.2 86.1 86.1 85.6  ?PLT 245 141*  --  124* 126* 141* 144*  ? ? ?LFT ?Recent Labs  ?Lab 11/07/21 ?1450 11/08/21 ?0400 11/09/21 ?1583 11/10/21 ?0940 11/11/21 ?0243  ?AST '20 31 27 24 20  '$ ?ALT '27 22 24 23 23  '$ ?ALKPHOS 80 59 59 53 67  ?BILITOT 0.5 0.6 0.7 0.3 0.6  ?PROT 7.2 5.8* 5.8* 5.5* 6.2*  ?ALBUMIN 4.2 3.1* 3.0* 2.9* 3.0*  ? ?  ?Antibiotics: ?Anti-infectives (From admission, onward)  ? ? Start     Dose/Rate Route Frequency Ordered Stop  ? 11/10/21 1115  ceFAZolin (ANCEF) IVPB 2g/100 mL premix       ? 2 g ?200 mL/hr over 30 Minutes Intravenous Every 8 hours 11/10/21 1022    ? 11/09/21 1200  ceFEPIme (MAXIPIME) 2 g in sodium chloride 0.9 % 100 mL IVPB  Status:  Discontinued       ? 2 g ?200 mL/hr over 30 Minutes Intravenous Every 12 hours 11/09/21 0901 11/10/21 1022  ? 11/09/21 0600  vancomycin (VANCOREADY) IVPB 1250 mg/250 mL  Status:  Discontinued       ? 1,250 mg ?166.7 mL/hr over 90 Minutes Intravenous Every 24 hours 11/08/21 0456 11/08/21 2200  ? 11/08/21 0415  vancomycin (VANCOREADY) IVPB 2000 mg/400 mL       ? 2,000 mg ?200 mL/hr over 120 Minutes Intravenous  Once 11/08/21 0412 11/08/21 0657  ? 11/08/21 0345  cefTRIAXone (ROCEPHIN) 2 g in sodium chloride 0.9 % 100 mL IVPB  Status:  Discontinued       ? 2 g ?200 mL/hr over 30 Minutes Intravenous Every 24 hours 11/08/21 0344 11/09/21 0837  ? ?  ? ? ? ?DVT prophylaxis: SCDs ? ?Code Status: Full code ? ?Family Communication: No family at  bedside ? ? ?CONSULTS urology ? ? ?Objective  ? ? ?Physical Examination: ? ?General-appears in no acute distress ?Heart-S1-S2, regular, no murmur auscultated ?Lungs-scattered rhonchi bilaterally ?Abdomen-soft, nontender, no organomegaly ?Extremities-no edema in the lower extremities ?Neuro-alert, oriented x3, no focal deficit noted ? ?Status is: Inpatient: Sepsis due to UTI, acute hypoxemic respiratory  failure ? ? ? ?  ? ? ?[name] ?  ?[facility] ?If 7PM-7AM, please contact night-coverage at [email or web address], ?Office  [phone] ? ? ?11/12/2021, 11:30 AM  LOS: 4 days  ? ? ? ? ? ? ? ? ? ? ?  ?

## 2021-11-12 NOTE — Progress Notes (Signed)
Bilateral lower extremity venous duplex has been completed. ?Preliminary results can be found in CV Proc through chart review.  ? ?11/12/21 11:09 AM ?Carlos Levering RVT   ?

## 2021-11-12 NOTE — Consult Note (Signed)
? ?NAME:  Edwin [name], MRN:  [id number], DOB:  1956/04/15, LOS: 4 ?ADMISSION DATE:  11/07/2021, CONSULTATION DATE:  11/12/21 ?REFERRING MD:  Darrick Meigs, CHIEF COMPLAINT:  dyspnea  ? ?History of Present Illness:  ?66yM with history of schizophrenia, BPH/prostate cancer, CKD3 who was admitted 4/12/ 23 for acute urinary retention, sepsis due to complicated UTI and bacteremia completing course of cefazolin. He developed increased oxygen requirement on 11/09/21 reaching peak of 8L O2 per [place] on 4/15. He has had some improvement with aggressive diuresis. He has also been found to have possible segmental PE on VQ scan 11/11/21 and he was started on heparin gtt. We are consulted for persistent acute hypoxic respiratory failure. ? ?Pertinent  Medical History  ?Schizophrenia ?CKD3 ?BPH/prostate cancer ? ?Significant Hospital Events: ?Including procedures, antibiotic start and stop dates in addition to other pertinent events   ?4/12 admitted, foley placed, ABX started ?4/13 increased O2 requirement started diuresis, narrowed to cefazolin ?4/15 found to have possible segmental PE LLL ? ?Interim History / Subjective:  ?N/a ? ?Objective   ?Blood pressure 108/66, pulse 82, temperature 97.7 ?F (36.5 ?C), temperature source Oral, resp. rate 18, height '5\' 11"'$  (1.803 m), weight 95.1 kg, SpO2 92 %. ?   ?   ? ?Intake/Output Summary (Last 24 hours) at 11/12/2021 1235 ?Last data filed at 11/12/2021 1117 ?Gross per 24 hour  ?Intake 1485.83 ml  ?Output 2891 ml  ?Net -1405.17 ml  ? ?Filed Weights  ? 11/08/21 1654 11/09/21 0500 11/12/21 0500  ?Weight: 102.5 kg 102.9 kg 95.1 kg  ? ? ?Examination: ?General appearance: 66 y.o., male, NAD, conversant  ?Eyes: anicteric sclerae; PERRL, tracking appropriately ?HENT: NCAT; MMM ?Neck: Trachea midline; no lymphadenopathy, no JVD ?Lungs: diminished bl taking tiny breaths due to abdominal discomfort ?CV: RRR, no murmur  ?Abdomen: Soft, non-tender; very distended but not super taut ?Extremities: No peripheral  edema, warm ?Skin: Normal turgor and texture; no rash ?Psych: Appropriate affect ?Neuro: Alert and oriented to person and place, no focal deficit  ? ?Bedside US without ascites, just a lot of stool ? ? ?CT A/P lung bases 4/12 with maybe just a bit of mucus plugging LLL ? ?NM VQ 4/15 with LLL segmental perfusion defect ? ?TTE 11/10/21 basically normal ? ?US DVT bilateral today without DVT prelim ? ?Resolved Hospital Problem list   ? ? ?Assessment & Plan:  ? ?# Acute hypoxic respiratory failure ?May be multifactorial in setting of resolving volume overload, possible newly discovered segmental PE. A major issue over the last couple of days however is his atelectasis with abdominal distension contributing to VQ mismatch/shunt.  ?- sit upright and get him in the chair, PT/mobilize, aggressive IS  ?- aggressive bowel regimen ?- continue diuresis for net negative fluid balance ?- if renal function recovers during hospitalization would obtain CTA Chest ?- walking oximetry prior to discharge  ?- I will set up pulm clinic follow up for him since he was a long term smoker I think it would be good to see him in clinic even if he's off of oxygen by discharge ? ?We will sign off but happy to be reinvolved if he gets any worse ? ?Best Practice (right click and "Reselect all SmartList Selections" daily)  ? ?Per primary ? ?Labs   ?CBC: ?Recent Labs  ?Lab 11/07/21 ?1450 11/08/21 ?0400 11/08/21 ?0904 11/09/21 ?0737 11/10/21 ?1062 11/11/21 ?0243 11/12/21 ?0228  ?WBC 11.8* 26.6*  --  32.6* 22.5* 14.2* 13.8*  ?NEUTROABS 9.4* 25.0*  --   --   --   --   --   ?  HGB 16.7 15.7 14.8 14.6 14.2 15.3 16.1  ?HCT 51.0 46.9 44.9 45.5 42.7 47.1 48.9  ?MCV 85.6 85.6  --  87.2 86.1 86.1 85.6  ?PLT 245 141*  --  124* 126* 141* 144*  ? ? ?Basic Metabolic Panel: ?Recent Labs  ?Lab 11/08/21 ?0400 11/09/21 ?6010 11/10/21 ?9323 11/11/21 ?0243 11/12/21 ?0228  ?NA 140 142 144 142 141  ?K 4.5 4.8 3.7 3.6 3.7  ?CL 108 111 110 106 107  ?CO2 20* '25 25 28 26   '$ ?GLUCOSE 166* 147* 121* 115* 108*  ?BUN 19 27* 33* 32* 31*  ?CREATININE 2.40* 1.86* 1.72* 1.70* 1.59*  ?CALCIUM 8.5* 8.4* 8.3* 8.8* 8.7*  ?MG 1.6*  --   --   --   --   ? ?GFR: ?Estimated Creatinine Clearance: 56 mL/min (A) (by C-G formula based on SCr of 1.59 mg/dL (H)). ?Recent Labs  ?Lab 11/08/21 ?0400 11/08/21 ?5573 11/08/21 ?0904 11/08/21 ?1420 11/08/21 ?1651 11/09/21 ?2202 11/10/21 ?5427 11/11/21 ?0243 11/12/21 ?0228  ?PROCALCITON 136.48  --   --   --   --  84.35  --   --   --   ?WBC 26.6*  --   --   --   --  32.6* 22.5* 14.2* 13.8*  ?LATICACIDVEN 4.6* 6.9* 4.3* 3.8* 3.8*  --   --   --   --   ? ? ?Liver Function Tests: ?Recent Labs  ?Lab 11/07/21 ?1450 11/08/21 ?0400 11/09/21 ?0623 11/10/21 ?7628 11/11/21 ?0243  ?AST '20 31 27 24 20  '$ ?ALT '27 22 24 23 23  '$ ?ALKPHOS 80 59 59 53 67  ?BILITOT 0.5 0.6 0.7 0.3 0.6  ?PROT 7.2 5.8* 5.8* 5.5* 6.2*  ?ALBUMIN 4.2 3.1* 3.0* 2.9* 3.0*  ? ?Recent Labs  ?Lab 11/07/21 ?1450  ?LIPASE 35  ? ?No results for input(s): AMMONIA in the last 168 hours. ? ?ABG ?   ?Component Value Date/Time  ? PHART 7.374 03/31/2015 1610  ? PCO2ART 26.6 (L) 03/31/2015 1610  ? PO2ART 72.3 (L) 03/31/2015 1610  ? HCO3 15.1 (L) 03/31/2015 1610  ? TCO2 19 06/10/2015 1004  ? ACIDBASEDEF 8.7 (H) 03/31/2015 1610  ? O2SAT 92.7 03/31/2015 1610  ?  ? ?Coagulation Profile: ?Recent Labs  ?Lab 11/08/21 ?0400 11/09/21 ?0240  ?INR 1.3* 1.2  ? ? ?Cardiac Enzymes: ?No results for input(s): CKTOTAL, CKMB, CKMBINDEX, TROPONINI in the last 168 hours. ? ?HbA1C: ?Hgb A1c MFr Bld  ?Date/Time Value Ref Range Status  ?11/08/2021 09:04 AM 6.8 (H) 4.8 - 5.6 % Final  ?  Comment:  ?  (NOTE) ?Pre diabetes:          5.7%-6.4% ? ?Diabetes:              >6.4% ? ?Glycemic control for   <7.0% ?adults with diabetes ?  ? ? ?CBG: ?No results for input(s): GLUCAP in the last 168 hours. ? ?Review of Systems:   ?12 point review of systems is negative except as in HPI ? ?Past Medical History:  ?He,  has a past medical history of Anxiety, Cancer  ([place]), CKD (chronic kidney disease) stage 3, GFR 30-59 ml/min (HCC), Depression, GERD (gastroesophageal reflux disease), History of Clostridium difficile colitis, History of sepsis, Hyperlipidemia, Paranoid schizophrenia ([place]), and Urinary incontinence.  ? ?Surgical History:  ? ?Past Surgical History:  ?Procedure Laterality Date  ? CERVICAL DISC SURGERY  1990's  ? CYSTOSCOPY N/A 03/08/2015  ? Procedure: CYSTOSCOPY;  Surgeon: Lowella Bandy, MD;  Location: [facility];  Service: Urology;  Laterality: N/A;  ? CYSTOSCOPY  06/10/2015  ? Procedure: CYSTOSCOPY;  Surgeon: Cleon Gustin, MD;  Location: [facility];  Service: Urology;;  ? PROSTATE BIOPSY N/A 03/08/2015  ? Procedure: BIOPSY TRANSRECTAL ULTRASONIC PROSTATE (TUBP);  Surgeon: Lowella Bandy, MD;  Location: [facility];  Service: Urology;  Laterality: N/A;  ? TRANSTHORACIC ECHOCARDIOGRAM  06-11-2015  ? grade 1 diastolic dysfunction, ef 02-77%;  trivial TR  ? TRANSURETHRAL RESECTION OF BLADDER TUMOR N/A 03/08/2015  ? Procedure: TRANSURETHRAL RESECTION OF BLADDER TUMOR (TURBT);  Surgeon: Lowella Bandy, MD;  Location: [facility];  Service: Urology;  Laterality: N/A;  TUR BIOPSY BLADDER  ? TRANSURETHRAL RESECTION OF PROSTATE N/A 06/27/2015  ? Procedure: TRANSURETHRAL RESECTION OF THE PROSTATE WITH GYRUS INSTRUMENTS;  Surgeon: Cleon Gustin, MD;  Location[facility];  Service: Urology;  Laterality: N/A;  ? TRANSURETHRAL RESECTION OF PROSTATE N/A 07/09/2016  ? Procedure: TRANSURETHRAL RESECTION OF THE PROSTATE (TURP);  Surgeon: Cleon Gustin, MD;  Location: [facility];  Service: Urology;  Laterality: N/A;  ?  ? ?Social History:  ? reports that he has been smoking cigarettes. He has a 80.00 pack-year smoking history. He has never used smokeless tobacco. He reports that he does not drink alcohol and does not use drugs.  ? ?Family History:  ?His family history includes Breast cancer in his mother.  ? ?Allergies ?No Known Allergies  ? ?Home  Medications  ?Prior to Admission medications   ?Medication Sig Start Date End Date Taking? Authorizing Provider  ?Ascorbic Acid (VITAMIN C) 1000 MG tablet Take 1,000 mg by mouth daily.   Yes [provider]

## 2021-11-12 NOTE — Progress Notes (Signed)
ANTICOAGULATION CONSULT NOTE - Follow Up Consult ? ?Pharmacy Consult for heparin  ?Indication:  suspected pulmonary embolus ? ?No Known Allergies ? ?Patient Measurements: ?Height: '5\' 11"'$  (180.3 cm) ?Weight: 95.1 kg (209 lb 10.5 oz) ?IBW/kg (Calculated) : 75.3 ?Heparin Dosing Weight: 95kg ? ?Vital Signs: ?Temp: 97.7 ?F (36.5 ?C) (04/16 1900) ?Temp Source: Axillary (04/16 1900) ?BP: 112/71 (04/16 1900) ?Pulse Rate: 83 (04/16 1930) ? ?Labs: ?Recent Labs  ?  11/10/21 ?7564 11/11/21 ?0243 11/12/21 ?0228 11/12/21 ?0831 11/12/21 ?1503 11/12/21 ?2310  ?HGB 14.2 15.3 16.1  --   --   --   ?HCT 42.7 47.1 48.9  --   --   --   ?PLT 126* 141* 144*  --   --   --   ?HEPARINUNFRC  --   --   --  0.45 0.26* 0.28*  ?CREATININE 1.72* 1.70* 1.59*  --   --   --   ? ? ? ?Estimated Creatinine Clearance: 56 mL/min (A) (by C-G formula based on SCr of 1.59 mg/dL (H)). ? ? 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  He's currently being treated for klebsiella bacteremia/UTI.  SCDs utilized on admission due to thrombocytopenia. V/Q scan on 11/11/21 showed focal perfusion defect and "sequelae associated with pulmonary embolism cannot be excluded." Heparin drip started on 11/11/21 for suspected VTE. ? ?Today, 11/12/2021: ?- Heparin level 0.28- slightly low on IV heparin 1750 units/hr ?- AM CBC: Hgb ok, plts low at 144K but trending up ?- No bleeding documented, no issues with heparin infusing per RN ?- LE doppler ordered for 4/16 negative for DVT ?- Scr 1.59- elevated but improving ? ?Goal of Therapy:  ?Heparin level 0.3-0.7 units/ml ?Monitor platelets by anticoagulation protocol: Yes ?  ?Plan:  ?- Increase heparin drip to 1900 units/hr ?- Check heparin level in 6 hours ?- Monitor for s/sx bleeding ?- Per TRH, plan to check CTA once renal function improved ? ?Netta Cedars, PharmD ?11/12/2021 11:45 PM ? ? ?

## 2021-11-12 NOTE — Progress Notes (Signed)
ANTICOAGULATION CONSULT NOTE - Follow Up Consult ? ?Pharmacy Consult for heparin  ?Indication:  suspected pulmonary embolus ? ?No Known Allergies ? ?Patient Measurements: ?Height: '5\' 11"'$  (180.3 cm) ?Weight: 95.1 kg (209 lb 10.5 oz) ?IBW/kg (Calculated) : 75.3 ?Heparin Dosing Weight: 95kg ? ?Vital Signs: ?Temp: 98 ?F (36.7 ?C) (04/16 9147) ?Temp Source: Oral (04/16 8295) ?BP: 103/72 (04/16 0042) ?Pulse Rate: 83 (04/16 0600) ? ?Labs: ?Recent Labs  ?  11/10/21 ?6213 11/11/21 ?0243 11/12/21 ?0228  ?HGB 14.2 15.3 16.1  ?HCT 42.7 47.1 48.9  ?PLT 126* 141* 144*  ?CREATININE 1.72* 1.70* 1.59*  ? ? ?Estimated Creatinine Clearance: 56 mL/min (A) (by C-G formula based on SCr of 1.59 mg/dL (H)). ? ? 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  He's currently being treated for klebsiella bacteremia/UTI.  SCDs utilized on admission due to thrombocytopenia. V/Q scan on 11/11/21 showed focal perfusion defect and "sequelae associated with pulmonary embolism cannot be excluded." Heparin drip started on 11/11/21 for suspected VTE. ? ?Today, 11/12/2021: ?- first heparin level is therapeutic at 0.45 ?- hgb ok, plts low at 144K but trending up ?- no bleeding documented ?- LE doppler ordered for 4/16 ? ? ?Goal of Therapy:  ?Heparin level 0.3-0.7 units/ml ?Monitor platelets by anticoagulation protocol: Yes ?  ?Plan:  ?- continue heparin drip at 1600 units/hr ?- recheck another heparin level at 3p to ensure level is still therapeutic before changing to daily monitoring ?- monitor for s/sx bleeding ? ?Dia Sitter P ?11/12/2021,7:30 AM ? ? ?

## 2021-11-13 ENCOUNTER — Encounter (HOSPITAL_COMMUNITY): Payer: Self-pay | Admitting: Internal Medicine

## 2021-11-13 DIAGNOSIS — T83021A Displacement of indwelling urethral catheter, initial encounter: Secondary | ICD-10-CM | POA: Diagnosis not present

## 2021-11-13 DIAGNOSIS — A419 Sepsis, unspecified organism: Secondary | ICD-10-CM | POA: Diagnosis not present

## 2021-11-13 DIAGNOSIS — N179 Acute kidney failure, unspecified: Secondary | ICD-10-CM | POA: Diagnosis not present

## 2021-11-13 DIAGNOSIS — Z8546 Personal history of malignant neoplasm of prostate: Secondary | ICD-10-CM | POA: Diagnosis not present

## 2021-11-13 LAB — CBC
HCT: 48.6 % (ref 39.0–52.0)
Hemoglobin: 16.1 g/dL (ref 13.0–17.0)
MCH: 28.6 pg (ref 26.0–34.0)
MCHC: 33.1 g/dL (ref 30.0–36.0)
MCV: 86.3 fL (ref 80.0–100.0)
Platelets: 162 10*3/uL (ref 150–400)
RBC: 5.63 MIL/uL (ref 4.22–5.81)
RDW: 14.5 % (ref 11.5–15.5)
WBC: 13.3 10*3/uL — ABNORMAL HIGH (ref 4.0–10.5)
nRBC: 0 % (ref 0.0–0.2)

## 2021-11-13 LAB — BASIC METABOLIC PANEL
Anion gap: 8 (ref 5–15)
BUN: 29 mg/dL — ABNORMAL HIGH (ref 8–23)
CO2: 24 mmol/L (ref 22–32)
Calcium: 8.7 mg/dL — ABNORMAL LOW (ref 8.9–10.3)
Chloride: 107 mmol/L (ref 98–111)
Creatinine, Ser: 1.53 mg/dL — ABNORMAL HIGH (ref 0.61–1.24)
GFR, Estimated: 51 mL/min — ABNORMAL LOW (ref >60)
Glucose, Bld: 110 mg/dL — ABNORMAL HIGH (ref 70–99)
Potassium: 4.1 mmol/L (ref 3.5–5.1)
Sodium: 139 mmol/L (ref 135–145)

## 2021-11-13 LAB — HEPARIN LEVEL (UNFRACTIONATED): Heparin Unfractionated: 0.35 IU/mL (ref 0.30–0.70)

## 2021-11-13 MED ORDER — APIXABAN 5 MG PO TABS
10.0000 mg | ORAL_TABLET | Freq: Two times a day (BID) | ORAL | Status: DC
  Administered 2021-11-13 – 2021-11-15 (×6): 10 mg via ORAL
  Filled 2021-11-13 (×6): qty 2

## 2021-11-13 MED ORDER — APIXABAN 5 MG PO TABS: 5.0000 mg | ORAL_TABLET | Freq: Two times a day (BID) | ORAL | Status: DC

## 2021-11-13 NOTE — Progress Notes (Addendum)
ANTICOAGULATION CONSULT NOTE - Follow Up Consult ? ?Pharmacy Consult for heparin  ?Indication:  suspected pulmonary embolus ? ?No Known Allergies ? ?Patient Measurements: ?Height: '5\' 11"'$  (180.3 cm) ?Weight: 93.2 kg (205 lb 7.5 oz) ?IBW/kg (Calculated) : 75.3 ?Heparin Dosing Weight: 95kg ? ?Vital Signs: ?Temp: 98.3 ?F (36.8 ?C) (04/17 0700) ?Temp Source: Oral (04/17 0700) ?BP: 111/63 (04/17 0800) ?Pulse Rate: 80 (04/17 0800) ? ?Labs: ?Recent Labs  ?  11/11/21 ?0243 11/12/21 ?0228 11/12/21 ?0831 11/12/21 ?1503 11/12/21 ?2310 11/13/21 ?0236 11/13/21 ?1749 11/13/21 ?4496  ?HGB 15.3 16.1  --   --   --   --  16.1  --   ?HCT 47.1 48.9  --   --   --   --  48.6  --   ?PLT 141* 144*  --   --   --   --  162  --   ?HEPARINUNFRC  --   --    < > 0.26* 0.28*  --   --  0.35  ?CREATININE 1.70* 1.59*  --   --   --  1.53*  --   --   ? < > = values in this interval not displayed.  ? ? ? ?Estimated Creatinine Clearance: 57.7 mL/min (A) (by C-G formula based on SCr of 1.53 mg/dL (H)). ? ? 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  He's currently being treated for klebsiella bacteremia/UTI.  SCDs utilized on admission due to thrombocytopenia. V/Q scan on 11/11/21 showed focal perfusion defect and "sequelae associated with pulmonary embolism cannot be excluded." Heparin drip started on 11/11/21 for suspected VTE. ? ?Today, 11/13/2021: ?- Heparin level 0.35 - therapeutic after heparin rate increased to 1900 units/hr ?- AM CBC: Hgb ok/stable, plts up to 162 ?- No bleeding documented, no issues with heparin infusing per RN ?- LE doppler ordered for 4/16 negative for DVT ?- Scr 1.53- elevated but improving ? ?Goal of Therapy:  ?Heparin level 0.3-0.7 units/ml ?Monitor platelets by anticoagulation protocol: Yes ?  ?Plan:  ?- continue heparin drip @ 1900 units/hr ?-  Check confirmatory heparin level in 6 hours ?- Monitor for s/sx bleeding ?- Per TRH, plan to check CTA once renal function improved ? ? ?Eudelia Bunch, Pharm.D ?11/13/2021 11:39 AM ? ?Addendum: transition to apixaban ?DC heparin drip ?Apixaban 10 mg po bid x 7 days followed by apixaban 5 mg po bid ?Will provide education & 30 day free card prior to discharge ? ?Eudelia Bunch, Pharm.D ?11/13/2021 1:14 PM ? ?

## 2021-11-13 NOTE — Progress Notes (Signed)
[facility] ? ?PROGRESS NOTE ? ?Edwin [name] [id number]:[id number] DOB: 08-01-1955 DOA: 11/07/2021 ?PCP: [facility], [facility] ? ? ?Brief HPI:   ?66 year old male with medical history of schizophrenia, hyperlipidemia, BPH/prostate cancer, CKD stage IIIa presented with altered mental status.  Patient came to ED 5 days ago for urinary retention.  A Foley catheter was placed and he was sent home with instructions to follow-up with urology.  Last night patient was confused and pulled his Foley out.  Family became concerned and brought him to the ED for further evaluation.  He was seen by urology.  He was found to have BPH with retention, poorly functioning bladder with resultant bilateral hydronephrosis.  Foley catheter was reinserted.  Patient started on IV antibiotics. ? ?VQ scan obtained yesterday showed focal perfusion defect involving the superior segment of the left lower lobe.  Pulmonary embolism could not be ruled out.  Venous duplex of lower extremities was negative for DVT.  Patient started on IV heparin.  Pulmonology was consulted for persistent acute hypoxemic respiratory failure. ? ?Subjective  ? ?Patient seen and examined, denies any complaints.  Moved his bowels.  Oxygen has been weaned off to 2 L/min. ? ? Assessment/Plan:  ? ? ?Sepsis due to UTI/Klebsiella pneumonia bacteremia ?-Urine culture growing Klebsiella pneumoniae, blood culture growing Klebsiella pneumonia and Enterobacterales ?-Blood culture and urine culture resulted, Klebsiella pneumoniae sensitive to cefazolin ?-WBC is down to 13,000 ?-Cefepime was switched to cefazolin ? ? ?Acute hypoxemic respiratory failure ?-Likely from pulmonary embolism/pulmonary edema ?-Started on IV heparin ?-Chest x-ray shows mild left basilar atelectasis ?-BNP elevated 396 ?-Patient has been getting IV fluids for acute kidney injury.?  Fluid overload ?-IV fluids were discontinued and patient was given 1 dose of Lasix 40 mg IV with brisk diuresis ?-Was given additional  Lasix 20 mg IV yesterday with good diuresis ?-Net - 8.3 L since ?-Echocardiogram showed EF of 50 to 55% with no right heart strain ?-Pulmonology was consulted, it was felt that his abdominal distention was contributing to VQ mismatch/shunt ?-Recommended getting in chair/PT mobilize, aggressive incentive spirometry ?-Aggressive bowel regimen ?-If renal function recovers consider obtaining CTA chest ?-Walking oximetry prior to discharge ?-Follow-up pulmonology at discharge ? ?Pulmonary embolism ?-Seen on VQ scan as above ?-Started on IV heparin; will switch to Eliquis ?-Bilateral venous duplex of lower extremities negative for DVT ?-Echocardiogram showed EF of 50 to 55% with no right heart strain ? ?Klebsiella pneumoniae/Enterobacterles bacteremia ?-Patient has bacteremia as above ?-Continue IV cefazolin ? ?Urinary retention ?-Patient history of BPH, prostate cancer ?-Foley catheter placed per urology ?-CT abdomen/pelvis showed hydronephrosis ?-Patient to follow-up urology as outpatient ? ?Constipation ?-Had BM with MiraLAX 17 g p.o. twice daily ?-Continue MiraLAX ? ?Acute kidney injury on CKD stage IIIa ?-Secondary to hydronephrosis; Foley catheter reinserted ?-Today creatinine has improved to 1.53 ?-Has been getting Lasix intermittently for fluid overload with excellent diuresis ? ?Schizophrenia ?-Stable ?-Continue home medications ? ? ? ?Medications ? ?  ? vitamin C  1,000 mg Oral Daily  ? aspirin EC  81 mg Oral Daily  ? benztropine  0.5 mg Oral q morning  ? bethanechol  5 mg Oral BID  ? cariprazine  6 mg Oral q morning  ? Chlorhexidine Gluconate Cloth  6 each Topical Daily  ? cholecalciferol  2,000 Units Oral Daily  ? cloZAPine  400 mg Oral QHS  ? finasteride  5 mg Oral Daily  ? ipratropium-albuterol  3 mL Nebulization TID  ? mouth rinse  15 mL  Mouth Rinse BID  ? multivitamin with minerals  1 tablet Oral Daily  ? polyethylene glycol  17 g Oral BID  ? tamsulosin  0.4 mg Oral Daily  ? temazepam  15 mg Oral QHS  ?  vitamin E  400 Units Oral Daily  ? zinc sulfate  220 mg Oral Daily  ? ? ? Data Reviewed:  ? ?CBG: ? ?No results for input(s): GLUCAP in the last 168 hours. ? ?SpO2: 95 % ?O2 Flow Rate (L/min): 2 L/min  ? ? ?Vitals:  ? 11/13/21 0700 11/13/21 0800 11/13/21 0858 11/13/21 1100  ?BP:  111/63    ?Pulse:  80    ?Resp:  (!) 24    ?Temp: 98.3 ?F (36.8 ?C)   97.8 ?F (36.6 ?C)  ?TempSrc: Oral   Oral  ?SpO2:  96% 95%   ?Weight:      ?Height:      ? ? ? ? ?Data Reviewed: ? ?Basic Metabolic Panel: ?Recent Labs  ?Lab 11/08/21 ?0400 11/09/21 ?7416 11/10/21 ?3845 11/11/21 ?0243 11/12/21 ?0228 11/13/21 ?0236  ?NA 140 142 144 142 141 139  ?K 4.5 4.8 3.7 3.6 3.7 4.1  ?CL 108 111 110 106 107 107  ?CO2 20* '25 25 28 26 24  '$ ?GLUCOSE 166* 147* 121* 115* 108* 110*  ?BUN 19 27* 33* 32* 31* 29*  ?CREATININE 2.40* 1.86* 1.72* 1.70* 1.59* 1.53*  ?CALCIUM 8.5* 8.4* 8.3* 8.8* 8.7* 8.7*  ?MG 1.6*  --   --   --   --   --   ? ? ?CBC: ?Recent Labs  ?Lab 11/07/21 ?1450 11/08/21 ?0400 11/08/21 ?0904 11/09/21 ?3646 11/10/21 ?8032 11/11/21 ?1224 11/12/21 ?8250 11/13/21 ?0370  ?WBC 11.8* 26.6*  --  32.6* 22.5* 14.2* 13.8* 13.3*  ?NEUTROABS 9.4* 25.0*  --   --   --   --   --   --   ?HGB 16.7 15.7   < > 14.6 14.2 15.3 16.1 16.1  ?HCT 51.0 46.9   < > 45.5 42.7 47.1 48.9 48.6  ?MCV 85.6 85.6  --  87.2 86.1 86.1 85.6 86.3  ?PLT 245 141*  --  124* 126* 141* 144* 162  ? < > = values in this interval not displayed.  ? ? ?LFT ?Recent Labs  ?Lab 11/07/21 ?1450 11/08/21 ?0400 11/09/21 ?4888 11/10/21 ?9169 11/11/21 ?0243  ?AST '20 31 27 24 20  '$ ?ALT '27 22 24 23 23  '$ ?ALKPHOS 80 59 59 53 67  ?BILITOT 0.5 0.6 0.7 0.3 0.6  ?PROT 7.2 5.8* 5.8* 5.5* 6.2*  ?ALBUMIN 4.2 3.1* 3.0* 2.9* 3.0*  ? ?  ?Antibiotics: ?Anti-infectives (From admission, onward)  ? ? Start     Dose/Rate Route Frequency Ordered Stop  ? 11/10/21 1115  ceFAZolin (ANCEF) IVPB 2g/100 mL premix       ? 2 g ?200 mL/hr over 30 Minutes Intravenous Every 8 hours 11/10/21 1022    ? 11/09/21 1200  ceFEPIme (MAXIPIME)  2 g in sodium chloride 0.9 % 100 mL IVPB  Status:  Discontinued       ? 2 g ?200 mL/hr over 30 Minutes Intravenous Every 12 hours 11/09/21 0901 11/10/21 1022  ? 11/09/21 0600  vancomycin (VANCOREADY) IVPB 1250 mg/250 mL  Status:  Discontinued       ? 1,250 mg ?166.7 mL/hr over 90 Minutes Intravenous Every 24 hours 11/08/21 0456 11/08/21 2200  ? 11/08/21 0415  vancomycin (VANCOREADY) IVPB 2000 mg/400 mL       ? 2,000 mg ?  200 mL/hr over 120 Minutes Intravenous  Once 11/08/21 0412 11/08/21 0657  ? 11/08/21 0345  cefTRIAXone (ROCEPHIN) 2 g in sodium chloride 0.9 % 100 mL IVPB  Status:  Discontinued       ? 2 g ?200 mL/hr over 30 Minutes Intravenous Every 24 hours 11/08/21 0344 11/09/21 0837  ? ?  ? ? ? ?DVT prophylaxis: SCDs ? ?Code Status: Full code ? ?Family Communication: No family at bedside ? ? ?CONSULTS urology ? ? ?Objective  ? ? ?Physical Examination: ? ?General-appears in no acute distress ?Heart-S1-S2, regular, no murmur auscultated ?Lungs-clear to auscultation bilaterally, no wheezing or crackles auscultated ?Abdomen-soft, nontender, no organomegaly ?Extremities-no edema in the lower extremities ?Neuro-alert, oriented x3, no focal deficit noted ? ?Status is: Inpatient: Sepsis due to UTI, acute hypoxemic respiratory failure ? ? ? ?  ? ? ?[name] ?  ?[facility] ?If 7PM-7AM, please contact night-coverage at [email or web address], ?Office  [phone] ? ? ?11/13/2021, 12:56 PM  LOS: 5 days  ? ? ? ? ? ? ? ? ? ? ?  ?

## 2021-11-14 DIAGNOSIS — N179 Acute kidney failure, unspecified: Secondary | ICD-10-CM | POA: Diagnosis not present

## 2021-11-14 DIAGNOSIS — G9340 Encephalopathy, unspecified: Secondary | ICD-10-CM | POA: Diagnosis not present

## 2021-11-14 DIAGNOSIS — R6520 Severe sepsis without septic shock: Secondary | ICD-10-CM | POA: Diagnosis not present

## 2021-11-14 DIAGNOSIS — A419 Sepsis, unspecified organism: Secondary | ICD-10-CM | POA: Diagnosis not present

## 2021-11-14 LAB — BASIC METABOLIC PANEL
Anion gap: 9 (ref 5–15)
BUN: 22 mg/dL (ref 8–23)
CO2: 21 mmol/L — ABNORMAL LOW (ref 22–32)
Calcium: 8.5 mg/dL — ABNORMAL LOW (ref 8.9–10.3)
Chloride: 108 mmol/L (ref 98–111)
Creatinine, Ser: 1.44 mg/dL — ABNORMAL HIGH (ref 0.61–1.24)
GFR, Estimated: 55 mL/min — ABNORMAL LOW (ref >60)
Glucose, Bld: 164 mg/dL — ABNORMAL HIGH (ref 70–99)
Potassium: 4.1 mmol/L (ref 3.5–5.1)
Sodium: 138 mmol/L (ref 135–145)

## 2021-11-14 NOTE — Progress Notes (Signed)
[facility] ? ?PROGRESS NOTE ? ?AMAURIE Edwin [name] YWV:[id number] DOB: 1956-06-20 DOA: 11/07/2021 ?PCP: [facility] ? ? ?Brief HPI:   ?66 year old male with medical history of schizophrenia, hyperlipidemia, BPH/prostate cancer, CKD stage IIIa presented with altered mental status.  Patient came to ED 5 days ago for urinary retention.  A Foley catheter was placed and he was sent home with instructions to follow-up with urology.  Last night patient was confused and pulled his Foley out.  Family became concerned and brought him to the ED for further evaluation.  He was seen by urology.  He was found to have BPH with retention, poorly functioning bladder with resultant bilateral hydronephrosis.  Foley catheter was reinserted.  Patient started on IV antibiotics. ? ?VQ scan obtained yesterday showed focal perfusion defect involving the superior segment of the left lower lobe.  Pulmonary embolism could not be ruled out.  Venous duplex of lower extremities was negative for DVT.  Patient started on IV heparin.  Pulmonology was consulted for persistent acute hypoxemic respiratory failure. ? ?Subjective  ? ?Patient seen and examined, feels better this morning.  Denies shortness of breath.  Currently requiring 2 L/min of oxygen via nasal cannula. ? ? Assessment/Plan:  ? ? ?Sepsis due to UTI/Klebsiella pneumonia bacteremia ?-Urine culture growing Klebsiella pneumoniae, blood culture growing Klebsiella pneumonia and Enterobacterales ?-Sepsis physiology has resolved ?-Blood culture and urine culture resulted, Klebsiella pneumoniae sensitive to cefazolin ?-WBC is down to 13,000 ?-Cefepime was switched to cefazolin ?-Has completed 7 days course of IV antibiotics.  Will discontinue antibiotics at this time. ? ? ?Acute hypoxemic respiratory failure ?-Likely from pulmonary embolism/pulmonary edema ?-Started on IV heparin ?-Chest x-ray shows mild left basilar atelectasis ?-BNP elevated 396 ?-Patient has been getting IV fluids for  acute kidney injury.?  Fluid overload ?-IV fluids were discontinued and patient was given 1 dose of Lasix 40 mg IV with brisk diuresis ?-Was given additional Lasix 20 mg IV yesterday with good diuresis ?-Net - 8.3 L since ?-Echocardiogram showed EF of 50 to 55% with no right heart strain ?-Pulmonology was consulted, it was felt that his abdominal distention was contributing to VQ mismatch/shunt ?-Recommended getting in chair/PT mobilize, aggressive incentive spirometry ?-Aggressive bowel regimen ?-If renal function recovers consider obtaining CTA chest ?-Walking oximetry prior to discharge ?-Follow-up pulmonology at discharge ? ?Pulmonary embolism ?-Seen on VQ scan as above ?-Started on IV heparin; heparin has been switched to Eliquis ?-Bilateral venous duplex of lower extremities negative for DVT ?-Echocardiogram showed EF of 50 to 55% with no right heart strain ? ?Klebsiella pneumoniae/Enterobacterles bacteremia ?-Patient has bacteremia as above ?-Completed course with 7 days of IV cefazolin ? ?Urinary retention ?-Patient history of BPH, prostate cancer ?-Foley catheter placed per urology ?-CT abdomen/pelvis showed hydronephrosis ?-Patient to follow-up urology as outpatient ? ?Constipation ?-Had BM with MiraLAX 17 g p.o. twice daily ?-Continue MiraLAX ? ?Acute kidney injury on CKD stage IIIa ?-Secondary to hydronephrosis; Foley catheter reinserted ?-Today creatinine has improved to 1.44 ?-Has been getting Lasix intermittently for fluid overload with excellent diuresis ? ?Schizophrenia ?-Stable ?-Continue home medications ? ? ? ?Medications ? ?  ? apixaban  10 mg Oral BID  ? Followed by  ? [START ON 11/20/2021] apixaban  5 mg Oral BID  ? vitamin C  1,000 mg Oral Daily  ? aspirin EC  81 mg Oral Daily  ? benztropine  0.5 mg Oral q morning  ? bethanechol  5 mg Oral BID  ? cariprazine  6 mg  Oral q morning  ? Chlorhexidine Gluconate Cloth  6 each Topical Daily  ? cholecalciferol  2,000 Units Oral Daily  ? cloZAPine  400  mg Oral QHS  ? finasteride  5 mg Oral Daily  ? ipratropium-albuterol  3 mL Nebulization TID  ? mouth rinse  15 mL Mouth Rinse BID  ? multivitamin with minerals  1 tablet Oral Daily  ? polyethylene glycol  17 g Oral BID  ? tamsulosin  0.4 mg Oral Daily  ? temazepam  15 mg Oral QHS  ? vitamin E  400 Units Oral Daily  ? zinc sulfate  220 mg Oral Daily  ? ? ? Data Reviewed:  ? ?CBG: ? ?No results for input(s): GLUCAP in the last 168 hours. ? ?SpO2: 93 % ?O2 Flow Rate (L/min): 2 L/min  ? ? ?Vitals:  ? 11/14/21 0900 11/14/21 1000 11/14/21 1100 11/14/21 1200  ?BP:  113/72  109/76  ?Pulse: 82 80 83 79  ?Resp: (!) 27 20 (!) 26 (!) 30  ?Temp:    97.9 ?F (36.6 ?C)  ?TempSrc:    Oral  ?SpO2: 92% 92% 90% 93%  ?Weight:      ?Height:      ? ? ? ? ?Data Reviewed: ? ?Basic Metabolic Panel: ?Recent Labs  ?Lab 11/08/21 ?0400 11/09/21 ?2878 11/10/21 ?6767 11/11/21 ?2094 11/12/21 ?0228 11/13/21 ?0236 11/14/21 ?7096  ?NA 140   < > 144 142 141 139 138  ?K 4.5   < > 3.7 3.6 3.7 4.1 4.1  ?CL 108   < > 110 106 107 107 108  ?CO2 20*   < > '25 28 26 24 '$ 21*  ?GLUCOSE 166*   < > 121* 115* 108* 110* 164*  ?BUN 19   < > 33* 32* 31* 29* 22  ?CREATININE 2.40*   < > 1.72* 1.70* 1.59* 1.53* 1.44*  ?CALCIUM 8.5*   < > 8.3* 8.8* 8.7* 8.7* 8.5*  ?MG 1.6*  --   --   --   --   --   --   ? < > = values in this interval not displayed.  ? ? ?CBC: ?Recent Labs  ?Lab 11/07/21 ?1450 11/08/21 ?0400 11/08/21 ?0904 11/09/21 ?2836 11/10/21 ?6294 11/11/21 ?7654 11/12/21 ?6503 11/13/21 ?5465  ?WBC 11.8* 26.6*  --  32.6* 22.5* 14.2* 13.8* 13.3*  ?NEUTROABS 9.4* 25.0*  --   --   --   --   --   --   ?HGB 16.7 15.7   < > 14.6 14.2 15.3 16.1 16.1  ?HCT 51.0 46.9   < > 45.5 42.7 47.1 48.9 48.6  ?MCV 85.6 85.6  --  87.2 86.1 86.1 85.6 86.3  ?PLT 245 141*  --  124* 126* 141* 144* 162  ? < > = values in this interval not displayed.  ? ? ?LFT ?Recent Labs  ?Lab 11/07/21 ?1450 11/08/21 ?0400 11/09/21 ?6812 11/10/21 ?7517 11/11/21 ?0243  ?AST '20 31 27 24 20  '$ ?ALT '27 22 24 23 23   '$ ?ALKPHOS 80 59 59 53 67  ?BILITOT 0.5 0.6 0.7 0.3 0.6  ?PROT 7.2 5.8* 5.8* 5.5* 6.2*  ?ALBUMIN 4.2 3.1* 3.0* 2.9* 3.0*  ? ?  ?Antibiotics: ?Anti-infectives (From admission, onward)  ? ? Start     Dose/Rate Route Frequency Ordered Stop  ? 11/10/21 1115  ceFAZolin (ANCEF) IVPB 2g/100 mL premix       ? 2 g ?200 mL/hr over 30 Minutes Intravenous Every 8 hours 11/10/21 1022 11/14/21 2359  ?  11/09/21 1200  ceFEPIme (MAXIPIME) 2 g in sodium chloride 0.9 % 100 mL IVPB  Status:  Discontinued       ? 2 g ?200 mL/hr over 30 Minutes Intravenous Every 12 hours 11/09/21 0901 11/10/21 1022  ? 11/09/21 0600  vancomycin (VANCOREADY) IVPB 1250 mg/250 mL  Status:  Discontinued       ? 1,250 mg ?166.7 mL/hr over 90 Minutes Intravenous Every 24 hours 11/08/21 0456 11/08/21 2200  ? 11/08/21 0415  vancomycin (VANCOREADY) IVPB 2000 mg/400 mL       ? 2,000 mg ?200 mL/hr over 120 Minutes Intravenous  Once 11/08/21 0412 11/08/21 0657  ? 11/08/21 0345  cefTRIAXone (ROCEPHIN) 2 g in sodium chloride 0.9 % 100 mL IVPB  Status:  Discontinued       ? 2 g ?200 mL/hr over 30 Minutes Intravenous Every 24 hours 11/08/21 0344 11/09/21 0837  ? ?  ? ? ? ?DVT prophylaxis: SCDs ? ?Code Status: Full code ? ?Family Communication: No family at bedside ? ? ?CONSULTS urology ? ? ?Objective  ? ? ?Physical Examination: ? ?General-appears in no acute distress ?Heart-S1-S2, regular, no murmur auscultated ?Lungs-clear to auscultation bilaterally, no wheezing or crackles auscultated ?Abdomen-soft, nontender, no organomegaly ?Extremities-no edema in the lower extremities ?Neuro-alert, oriented x3, no focal deficit noted ? ?Status is: Inpatient: Sepsis due to UTI, acute hypoxemic respiratory failure ? ? ? ?  ? ? ?[name] ?  ?[facility] ?If 7PM-7AM, please contact night-coverage at [email or web address], ?Office  [phone] ? ? ?11/14/2021, 1:01 PM  LOS: 6 days  ? ? ? ? ? ? ? ? ? ? ?  ?

## 2021-11-15 ENCOUNTER — Inpatient Hospital Stay (HOSPITAL_COMMUNITY): Payer: 59

## 2021-11-15 DIAGNOSIS — N401 Enlarged prostate with lower urinary tract symptoms: Secondary | ICD-10-CM | POA: Diagnosis not present

## 2021-11-15 DIAGNOSIS — A419 Sepsis, unspecified organism: Secondary | ICD-10-CM | POA: Diagnosis not present

## 2021-11-15 DIAGNOSIS — N179 Acute kidney failure, unspecified: Secondary | ICD-10-CM | POA: Diagnosis not present

## 2021-11-15 DIAGNOSIS — R338 Other retention of urine: Secondary | ICD-10-CM

## 2021-11-15 DIAGNOSIS — G9340 Encephalopathy, unspecified: Secondary | ICD-10-CM | POA: Diagnosis not present

## 2021-11-15 LAB — BASIC METABOLIC PANEL
Anion gap: 7 (ref 5–15)
BUN: 19 mg/dL (ref 8–23)
CO2: 23 mmol/L (ref 22–32)
Calcium: 8.7 mg/dL — ABNORMAL LOW (ref 8.9–10.3)
Chloride: 107 mmol/L (ref 98–111)
Creatinine, Ser: 1.44 mg/dL — ABNORMAL HIGH (ref 0.61–1.24)
GFR, Estimated: 55 mL/min — ABNORMAL LOW (ref >60)
Glucose, Bld: 145 mg/dL — ABNORMAL HIGH (ref 70–99)
Potassium: 5.2 mmol/L — ABNORMAL HIGH (ref 3.5–5.1)
Sodium: 137 mmol/L (ref 135–145)

## 2021-11-15 MED ORDER — SODIUM CHLORIDE (PF) 0.9 % IJ SOLN
INTRAMUSCULAR | Status: AC
  Filled 2021-11-15: qty 50

## 2021-11-15 MED ORDER — SODIUM ZIRCONIUM CYCLOSILICATE 5 G PO PACK
5.0000 g | PACK | Freq: Once | ORAL | Status: AC
  Administered 2021-11-15: 5 g via ORAL
  Filled 2021-11-15: qty 1

## 2021-11-15 MED ORDER — IOHEXOL 350 MG/ML SOLN
80.0000 mL | Freq: Once | INTRAVENOUS | Status: AC | PRN
  Administered 2021-11-15: 80 mL via INTRAVENOUS

## 2021-11-15 NOTE — Progress Notes (Signed)
[facility] ? ?PROGRESS NOTE ? ?Edwin [name] [id number] DOB: 02/18/1956 DOA: 11/07/2021 ?PCP: [facility] ? ? ?Brief HPI:   ?66 year old male with medical history of schizophrenia, hyperlipidemia, BPH/prostate cancer, CKD stage IIIa presented with altered mental status.  Patient came to ED 5 days ago for urinary retention.  A Foley catheter was placed and he was sent home with instructions to follow-up with urology.  Last night patient was confused and pulled his Foley out.  Family became concerned and brought him to the ED for further evaluation.  He was seen by urology.  He was found to have BPH with retention, poorly functioning bladder with resultant bilateral hydronephrosis.  Foley catheter was reinserted.  Patient started on IV antibiotics. ? ?VQ scan obtained yesterday showed focal perfusion defect involving the superior segment of the left lower lobe.  Pulmonary embolism could not be ruled out.  Venous duplex of lower extremities was negative for DVT.  Patient started on IV heparin.  Pulmonology was consulted for persistent acute hypoxemic respiratory failure. ? ?Subjective  ? ?Patient seen and examined, denies shortness of breath.  Renal function is slowly improving. ? ? Assessment/Plan:  ? ? ?Sepsis due to UTI/Klebsiella pneumonia bacteremia ?-Urine culture growing Klebsiella pneumoniae, blood culture growing Klebsiella pneumonia and Enterobacterales ?-Sepsis physiology has resolved ?-Blood culture and urine culture resulted, Klebsiella pneumoniae sensitive to cefazolin ?-WBC is down to 13,000 ?-Cefepime was switched to cefazolin ?-Has completed 7 days course of IV antibiotics.  Will discontinue antibiotics at this time. ? ? ?Acute hypoxemic respiratory failure ?-Likely from pulmonary embolism/pulmonary edema ?-Started on IV heparin ?-Chest x-ray shows mild left basilar atelectasis ?-BNP elevated 396 ?-Patient has been getting IV fluids for acute kidney injury.?  Fluid overload ?-IV  fluids were discontinued and patient was given 1 dose of Lasix 40 mg IV with brisk diuresis ?-Was given additional Lasix 20 mg IV yesterday with good diuresis ?-Net - 8.3 L since ?-Echocardiogram showed EF of 50 to 55% with no right heart strain ?-Pulmonology was consulted, it was felt that his abdominal distention was contributing to VQ mismatch/shunt ?-Recommended getting in chair/PT mobilize, aggressive incentive spirometry ?-Aggressive bowel regimen ?-Renal function has improved today, creatinine 1.44, called and discussed with radiologist ?-We will order CTA chest to rule out pulmonary embolism, as VQ scan was not very convincing for PE as per radiologist and PCCM ?-Walking oximetry prior to discharge ?-Follow-up pulmonology at discharge ? ?Pulmonary embolism ?-Seen on VQ scan as above ?-Started on IV heparin; heparin has been switched to Eliquis ?-Bilateral venous duplex of lower extremities negative for DVT ?-Echocardiogram showed EF of 50 to 55% with no right heart strain ?-CT chest ordered as above to confirm pulmonary embolism. ? ?Klebsiella pneumoniae/Enterobacterles bacteremia ?-Patient has bacteremia as above ?-Completed course with 7 days of IV cefazolin ? ?Urinary retention ?-Patient history of BPH, prostate cancer ?-Foley catheter placed per urology ?-CT abdomen/pelvis showed hydronephrosis ?-Patient to follow-up urology as outpatient ? ?Constipation ?-Had BM with MiraLAX 17 g p.o. twice daily ?-Continue MiraLAX ? ?Acute kidney injury on CKD stage IIIa ?-Secondary to hydronephrosis; Foley catheter reinserted ?-Today creatinine has improved to 1.44 ?-Has been getting Lasix intermittently for fluid overload with excellent diuresis ?-Net -12.8 L ? ?Schizophrenia ?-Stable ?-Continue home medications ? ?Hyperkalemia ?-Potassium is 5.2 ?-We will give 1 dose of Lokelma 5 g p.o. x1 ?-Follow BMP in am ? ? ? ?Medications ? ?  ? apixaban  10 mg Oral BID  ? Followed by  ? [  START ON 11/20/2021] apixaban  5 mg Oral  BID  ? vitamin C  1,000 mg Oral Daily  ? benztropine  0.5 mg Oral q morning  ? bethanechol  5 mg Oral BID  ? cariprazine  6 mg Oral q morning  ? Chlorhexidine Gluconate Cloth  6 each Topical Daily  ? cholecalciferol  2,000 Units Oral Daily  ? cloZAPine  400 mg Oral QHS  ? finasteride  5 mg Oral Daily  ? ipratropium-albuterol  3 mL Nebulization TID  ? mouth rinse  15 mL Mouth Rinse BID  ? multivitamin with minerals  1 tablet Oral Daily  ? polyethylene glycol  17 g Oral BID  ? sodium zirconium cyclosilicate  5 g Oral Once  ? tamsulosin  0.4 mg Oral Daily  ? temazepam  15 mg Oral QHS  ? vitamin E  400 Units Oral Daily  ? zinc sulfate  220 mg Oral Daily  ? ? ? Data Reviewed:  ? ?CBG: ? ?No results for input(s): GLUCAP in the last 168 hours. ? ?SpO2: 94 % ?O2 Flow Rate (L/min): 2 L/min  ? ? ?Vitals:  ? 11/15/21 0300 11/15/21 0400 11/15/21 0754 11/15/21 0903  ?BP:  124/77  123/75  ?Pulse:  75  83  ?Resp:    (!) 24  ?Temp: 98 ?F (36.7 ?C)  98.4 ?F (36.9 ?C)   ?TempSrc: Oral  Oral   ?SpO2:  94% 95% 94%  ?Weight:      ?Height:      ? ? ? ? ?Data Reviewed: ? ?Basic Metabolic Panel: ?Recent Labs  ?Lab 11/11/21 ?0243 11/12/21 ?0228 11/13/21 ?0236 11/14/21 ?3762 11/15/21 ?8315  ?NA 142 141 139 138 137  ?K 3.6 3.7 4.1 4.1 5.2*  ?CL 106 107 107 108 107  ?CO2 '28 26 24 '$ 21* 23  ?GLUCOSE 115* 108* 110* 164* 145*  ?BUN 32* 31* 29* 22 19  ?CREATININE 1.70* 1.59* 1.53* 1.44* 1.44*  ?CALCIUM 8.8* 8.7* 8.7* 8.5* 8.7*  ? ? ?CBC: ?Recent Labs  ?Lab 11/09/21 ?1761 11/10/21 ?6073 11/11/21 ?7106 11/12/21 ?2694 11/13/21 ?8546  ?WBC 32.6* 22.5* 14.2* 13.8* 13.3*  ?HGB 14.6 14.2 15.3 16.1 16.1  ?HCT 45.5 42.7 47.1 48.9 48.6  ?MCV 87.2 86.1 86.1 85.6 86.3  ?PLT 124* 126* 141* 144* 162  ? ? ?LFT ?Recent Labs  ?Lab 11/09/21 ?2703 11/10/21 ?5009 11/11/21 ?0243  ?AST '27 24 20  '$ ?ALT '24 23 23  '$ ?ALKPHOS 59 53 67  ?BILITOT 0.7 0.3 0.6  ?PROT 5.8* 5.5* 6.2*  ?ALBUMIN 3.0* 2.9* 3.0*  ? ?  ?Antibiotics: ?Anti-infectives (From admission, onward)  ? ? Start      Dose/Rate Route Frequency Ordered Stop  ? 11/10/21 1115  ceFAZolin (ANCEF) IVPB 2g/100 mL premix       ? 2 g ?200 mL/hr over 30 Minutes Intravenous Every 8 hours 11/10/21 1022 11/14/21 1854  ? 11/09/21 1200  ceFEPIme (MAXIPIME) 2 g in sodium chloride 0.9 % 100 mL IVPB  Status:  Discontinued       ? 2 g ?200 mL/hr over 30 Minutes Intravenous Every 12 hours 11/09/21 0901 11/10/21 1022  ? 11/09/21 0600  vancomycin (VANCOREADY) IVPB 1250 mg/250 mL  Status:  Discontinued       ? 1,250 mg ?166.7 mL/hr over 90 Minutes Intravenous Every 24 hours 11/08/21 0456 11/08/21 2200  ? 11/08/21 0415  vancomycin (VANCOREADY) IVPB 2000 mg/400 mL       ? 2,000 mg ?200 mL/hr over 120 Minutes Intravenous  Once 11/08/21 0412 11/08/21 0657  ? 11/08/21 0345  cefTRIAXone (ROCEPHIN) 2 g in sodium chloride 0.9 % 100 mL IVPB  Status:  Discontinued       ? 2 g ?200 mL/hr over 30 Minutes Intravenous Every 24 hours 11/08/21 0344 11/09/21 0837  ? ?  ? ? ? ?DVT prophylaxis: SCDs ? ?Code Status: Full code ? ?Family Communication: No family at bedside ? ? ?CONSULTS urology ? ? ?Objective  ? ? ?Physical Examination: ? ?General-appears in no acute distress ?Heart-S1-S2, regular, no murmur auscultated ?Lungs-clear to auscultation bilaterally, no wheezing or crackles auscultated ?Abdomen-soft, nontender, no organomegaly ?Extremities-no edema in the lower extremities ?Neuro-alert, oriented x3, no focal deficit noted ? ?Status is: Inpatient: Sepsis due to UTI, acute hypoxemic respiratory failure ? ? ? ?  ? ? ?[name] ?  ?[facility] ?If 7PM-7AM, please contact night-coverage at [email or web address], ?Office  [phone] ? ? ?11/15/2021, 10:13 AM  LOS: 7 days  ? ? ? ? ? ? ? ? ? ? ?  ?

## 2021-11-15 NOTE — Evaluation (Signed)
Physical Therapy Evaluation ?Patient Details ?Name: Edwin [name] ?MRN: [id number] ?DOB: April 10, 1956 ?Today's Date: 11/15/2021 ? ?History of Present Illness ? 66 yo male admitted with AMS, respiratory failure, UTI, sepsis. Hx of schizophrenia, prostate ca, CKD,TURP  ?Clinical Impression ? On eval, pt required Min A for mobility. He walked ~70 feet with handrail vs no support. Unsteady. Pt c/o feet feeling numb causing unsteadiness. O2 91% on RA. Will plan to follow and progress activity as tolerated. PT recommendation at this time is for HHPT f/u (depending on continued progress). Recommend daily OOB/ambulation with nursing to increase activity.  ?   ? ?Recommendations for follow up therapy are one component of a multi-disciplinary discharge planning process, led by the attending physician.  Recommendations may be updated based on patient status, additional functional criteria and insurance authorization. ? ?Follow Up Recommendations Home health PT ? ?  ?Assistance Recommended at Discharge Intermittent Supervision/Assistance  ?Patient can return home with the following ?   ? ?  ?Equipment Recommendations  (continuing to assess)  ?Recommendations for Other Services ? OT consult  ?  ?Functional Status Assessment Patient has had a recent decline in their functional status and demonstrates the ability to make significant improvements in function in a reasonable and predictable amount of time.  ? ?  ?Precautions / Restrictions Precautions ?Precautions: Fall ?Restrictions ?Weight Bearing Restrictions: No  ? ?  ? ?Mobility ? Bed Mobility ?Overal bed mobility: Modified Independent ?  ?  ?  ?  ?  ?  ?  ?  ? ?Transfers ?Overall transfer level: Needs assistance ?  ?Transfers: Sit to/from Stand ?Sit to Stand: Supervision ?  ?  ?  ?  ?  ?  ?  ? ?Ambulation/Gait ?Ambulation/Gait assistance: Min guard;Min assist ?Gait Distance (Feet): 75 Feet ?Assistive device: None (vs hallway handrail) ?Gait Pattern/deviations: Step-through  pattern, Decreased stride length ?  ?  ?  ?General Gait Details: Unsteady. Pt c/o feet feeling numb. O2 91% on RA. ? ?Stairs ?  ?  ?  ?  ?  ? ?Wheelchair Mobility ?  ? ?Modified Rankin (Stroke Patients Only) ?  ? ?  ? ?Balance Overall balance assessment: Needs assistance ?  ?  ?  ?  ?Standing balance support: During functional activity, Single extremity supported ?Standing balance-Leahy Scale: Fair ?  ?  ?  ?  ?  ?  ?  ?  ?  ?  ?  ?  ?   ? ? ? ?Pertinent Vitals/Pain Pain Assessment ?Pain Assessment: No/denies pain  ? ? ?Home Living Family/patient expects to be discharged to:: Private residence ?Living Arrangements: Other relatives ?  ?Type of Home: House ?Home Access: Stairs to enter ?  ?Entrance Stairs-Number of Steps: 2 ?  ?  ?Home Equipment: None ?   ?  ?Prior Function Prior Level of Function : Independent/Modified Independent ?  ?  ?  ?  ?  ?  ?  ?  ?  ? ? ?Hand Dominance  ?   ? ?  ?Extremity/Trunk Assessment  ? Upper Extremity Assessment ?Upper Extremity Assessment: Overall WFL for tasks assessed ?  ? ?Lower Extremity Assessment ?Lower Extremity Assessment: Generalized weakness (pt c/o numbness in bil feet) ?  ? ?Cervical / Trunk Assessment ?Cervical / Trunk Assessment: Normal  ?Communication  ? Communication: No difficulties (soft spoken)  ?Cognition Arousal/Alertness: Awake/alert ?Behavior During Therapy: [facility] for tasks assessed/performed ?Overall Cognitive Status: Within Functional Limits for tasks assessed ?  ?  ?  ?  ?  ?  ?  ?  ?  ?  ?  ?  ?  ?  ?  ?  ?  ?  ?  ? ?  ?  General Comments   ? ?  ?Exercises    ? ?Assessment/Plan  ?  ?PT Assessment Patient needs continued PT services  ?PT Problem List Decreased balance;Decreased activity tolerance;Decreased mobility;Decreased strength ? ?   ?  ?PT Treatment Interventions DME instruction;Gait training;Therapeutic activities;Therapeutic exercise;Patient/family education;Balance training;Functional mobility training   ? ?PT Goals (Current goals can be found in the  Care Plan section)  ?Acute Rehab PT Goals ?Patient Stated Goal: none stated ?PT Goal Formulation: With patient ?Time For Goal Achievement: 11/29/21 ?Potential to Achieve Goals: Good ? ?  ?Frequency Min 3X/week ?  ? ? ?Co-evaluation   ?  ?  ?  ?  ? ? ?  ?AM-PAC PT "6 Clicks" Mobility  ?Outcome Measure Help needed turning from your back to your side while in a flat bed without using bedrails?: None ?Help needed moving from lying on your back to sitting on the side of a flat bed without using bedrails?: None ?Help needed moving to and from a bed to a chair (including a wheelchair)?: A Little ?Help needed standing up from a chair using your arms (e.g., wheelchair or bedside chair)?: A Little ?Help needed to walk in hospital room?: A Little ?Help needed climbing 3-5 steps with a railing? : A Little ?6 Click Score: 20 ? ?  ?End of Session Equipment Utilized During Treatment: Gait belt ?Activity Tolerance: Patient tolerated treatment well ?Patient left: in chair;with call bell/phone within reach ?  ?PT Visit Diagnosis: Unsteadiness on feet (R26.81) ?  ? ?Time: 1884-1660 ?PT Time Calculation (min) (ACUTE ONLY): 18 min ? ? ?Charges:   PT Evaluation ?$PT Eval Moderate Complexity: 1 Mod ?  ?  ?   ? ? ? ? ?Krimson Massmann P, PT ?Acute Rehabilitation  ?Office: [phone] ?Pager: [phone] ? ?  ? ?

## 2021-11-16 LAB — BASIC METABOLIC PANEL
Anion gap: 5 (ref 5–15)
BUN: 21 mg/dL (ref 8–23)
CO2: 27 mmol/L (ref 22–32)
Calcium: 9.1 mg/dL (ref 8.9–10.3)
Chloride: 106 mmol/L (ref 98–111)
Creatinine, Ser: 1.4 mg/dL — ABNORMAL HIGH (ref 0.61–1.24)
GFR, Estimated: 56 mL/min — ABNORMAL LOW (ref >60)
Glucose, Bld: 116 mg/dL — ABNORMAL HIGH (ref 70–99)
Potassium: 4.5 mmol/L (ref 3.5–5.1)
Sodium: 138 mmol/L (ref 135–145)

## 2021-11-16 MED ORDER — TAMSULOSIN HCL 0.4 MG PO CAPS: 0.4000 mg | ORAL_CAPSULE | Freq: Every day | ORAL | 2 refills | Status: DC

## 2021-11-16 MED ORDER — ALBUTEROL SULFATE HFA 108 (90 BASE) MCG/ACT IN AERS: 2.0000 | INHALATION_SPRAY | Freq: Four times a day (QID) | RESPIRATORY_TRACT | 1 refills | Status: DC | PRN

## 2021-11-16 MED ORDER — POLYETHYLENE GLYCOL 3350 17 G PO PACK: 17.0000 g | PACK | Freq: Every day | ORAL | 0 refills | Status: DC | PRN

## 2021-11-16 MED ORDER — BETHANECHOL CHLORIDE 10 MG PO TABS: 5.0000 mg | ORAL_TABLET | Freq: Two times a day (BID) | ORAL | Status: DC

## 2021-11-16 MED ORDER — BISACODYL 10 MG RE SUPP
10.0000 mg | Freq: Once | RECTAL | Status: DC
Start: 2021-11-16 — End: 2021-11-16

## 2021-11-16 NOTE — TOC Transition Note (Signed)
Transition of Care (TOC) - CM/SW Discharge Note ? ? ?Patient Details  ?Name: Edwin [name] ?MRN: [id number] ?Date of Birth: 1956/04/26 ? ?Transition of Care [facility]) CM/SW Contact:  ?Dessa Phi, RN ?Phone Number: ?11/16/2021, 1:15 PM ? ? ?Clinical Narrative:  d/c home w/[employer] rep Malachy Mood accepted for HHRN-f/c mgmnt/HHPT-informed [name]  that [facility] services intermittient-teach f/c mgmnt to patient/family;HHPT-intermittent teach exercises. Family has own transport home No further CM needs.  ? ? ? ?Final next level of care: [place] ?Barriers to Discharge: No Barriers Identified ? ? ?Patient Goals and CMS Choice ?  ?  ?  ? ?Discharge Placement ?  ?           ?  ?  ?  ?  ? ?Discharge Plan and Services ?  ?Discharge Planning Services: CM Consult ?Post Acute Care Choice: Home Health          ?  ?  ?  ?  ?  ?HH Arranged: Therapist, sports, PT ?[place] Agency: [facility] ?Date HH Agency Contacted: 11/16/21 ?Time [place]: [phone] ?Representative spoke with at [place]: Malachy Mood ? ?Social Determinants of Health (SDOH) Interventions ?  ? ? ?Readmission Risk Interventions ?   ? View : No data to display.  ?  ?  ?  ? ? ? ? ? ?

## 2021-11-16 NOTE — Progress Notes (Signed)
SATURATION QUALIFICATIONS: (This note is used to comply with regulatory documentation for home oxygen) ? ?Patient Saturations on Room Air at Rest = 96% ? ?Patient Saturations on Room Air while Ambulating = 90% ? ?Patient Saturations on 2 Liters of oxygen while Ambulating = 95% ? ?Please briefly explain why patient needs home oxygen:  ?Acute hypoxemic respiratory failure. Medically appropriate for discharge other than still requiring 2L [place] ?

## 2021-11-16 NOTE — Progress Notes (Signed)
Swapped foley bag to leg bag and educated patient and his brother on how to use the bag and proper cleaning of the area.  ?

## 2021-11-16 NOTE — Discharge Summary (Signed)
?Physician Discharge Summary ?  ?Patient: Edwin [name] MRN: [id number] DOB: 05/26/1956  ?Admit date:     11/07/2021  ?Discharge date: 11/16/21  ?Discharge Physician: [name]  ? ?PCP: [facility]  ? ?Recommendations at discharge:  ? ?Follow-up urology as outpatient ?Follow-up pulmonology as outpatient ?Patient be discharged with Foley catheter ?Follow-up PCP in 2 weeks ? ?Discharge Diagnoses: ?Principal Problem: ?  Severe sepsis ([place]) ?Active Problems: ?  Paranoid schizophrenia, chronic condition ([place]) ?  AKI (acute kidney injury) ([place]) ?  HLD (hyperlipidemia) ?  Acute encephalopathy ?  BPH (benign prostatic hyperplasia) ?  Stage 3a chronic kidney disease (CKD) ([place]) ?  History of prostate cancer ?  Hypokalemia ?  Hypomagnesemia ?  Hyperglycemia ? ?Resolved Problems: ?  * No resolved hospital problems. * ? ?Hospital Course: ? ?66 year old male with medical history of schizophrenia, hyperlipidemia, BPH/prostate cancer, CKD stage IIIa presented with altered mental status.  Patient came to ED 5 days ago for urinary retention.  A Foley catheter was placed and he was sent home with instructions to follow-up with urology.  Last night patient was confused and pulled his Foley out.  Family became concerned and brought him to the ED for further evaluation.  He was seen by urology.  He was found to have BPH with retention, poorly functioning bladder with resultant bilateral hydronephrosis.  Foley catheter was reinserted.  Patient started on IV antibiotics. ?  ?VQ scan obtained yesterday showed focal perfusion defect involving the superior segment of the left lower lobe.  Pulmonary embolism could not be ruled out.  Venous duplex of lower extremities was negative for DVT.  Patient started on IV heparin.  Pulmonology was consulted for persistent acute hypoxemic respiratory failure. ? ?Assessment and Plan: ? ?Sepsis due to UTI/Klebsiella pneumonia bacteremia ?-Urine culture growing Klebsiella pneumoniae, blood culture  growing Klebsiella pneumonia and Enterobacterales ?-Sepsis physiology has resolved ?-Blood culture and urine culture resulted, Klebsiella pneumoniae sensitive to cefazolin ?-WBC is down to 13,000 ?-Cefepime was switched to cefazolin ?-Has completed 7 days course of IV antibiotics.  ?-Antibiotics have been discontinued ?  ?  ?Acute hypoxemic respiratory failure ?-Likely from pulmonary embolism/pulmonary edema ?-Started on IV heparin ?-Chest x-ray shows mild left basilar atelectasis ?-BNP elevated 396 ?-Patient has been getting IV fluids for acute kidney injury.?  Fluid overload ?-IV fluids were discontinued and patient was given 1 dose of Lasix 40 mg IV with brisk diuresis ?-Was given additional Lasix 20 mg IV yesterday with good diuresis ?-Net - 8.3 L since ?-Echocardiogram showed EF of 50 to 55% with no right heart strain ?-Pulmonology was consulted, it was felt that his abdominal distention was contributing to VQ mismatch/shunt ?-Recommended getting in chair/PT mobilize, aggressive incentive spirometry ?-Aggressive bowel regimen ?-Renal function has improved today, creatinine 1.44, called and discussed with radiologist ?-As per radiology, CTA chest can be done with creatinine 1.44. ?-CTA chest was obtained which was negative for pulmonary embolism; will discontinue Eliquis ?-Walking oximetry done today, patient does not require oxygen ?-Follow-up pulmonology at discharge ?  ?Pulmonary embolism ?-Seen on VQ scan as above ?-Started on IV heparin; heparin has been switched to Eliquis ?-Bilateral venous duplex of lower extremities negative for DVT ?-Echocardiogram showed EF of 50 to 55% with no right heart strain ?-CTA chest done yesterday was negative for PE. ?-Eliquis has been discontinued ?  ?Klebsiella pneumoniae/Enterobacterles bacteremia ?-Patient has bacteremia as above ?-Completed course with 7 days of IV cefazolin ?  ?Urinary retention ?-Patient history of BPH,  prostate cancer ?-Foley catheter placed per  urology ?-CT abdomen/pelvis showed hydronephrosis ?-Patient to be discharged with Foley catheter ?-Patient to follow-up urology as outpatient ?  ?Constipation ?-Resolved ?-Continue MiraLAX as needed ?  ?Acute kidney injury on CKD stage IIIa ?-Secondary to hydronephrosis; Foley catheter reinserted ?-Today creatinine has improved to 1.40 ?-Has been getting Lasix intermittently for fluid overload with excellent diuresis ?-Net - 16.5 L ?  ?Schizophrenia ?-Stable ?-Continue home medications ?  ?Hyperkalemia ?-Resolved, today potassium is 4.5 ?  ? ?  ? ? ?Consultants:  ?Procedures performed:  ?Disposition: Home ?Diet recommendation:  ?Discharge Diet Orders (From admission, onward)  ? ?  Start     Ordered  ? 11/16/21 0000  Diet - low sodium heart healthy       ? 11/16/21 1306  ? ?  ?  ? ?  ? ?Regular diet ?DISCHARGE MEDICATION: ?Allergies as of 11/16/2021   ?No Known Allergies ?  ? ?  ?Medication List  ?  ? ?TAKE these medications   ? ?acetaminophen 500 MG tablet ?Commonly known as: TYLENOL ?Take 2 tablets (1,000 mg total) by mouth every 6 (six) hours as needed for moderate pain or headache. ?  ?albuterol 108 (90 Base) MCG/ACT inhaler ?Commonly known as: VENTOLIN HFA ?Inhale 2 puffs into the lungs every 6 (six) hours as needed for wheezing or shortness of breath. ?  ?aspirin EC 81 MG tablet ?Take 81 mg by mouth daily. ?  ?benztropine 0.5 MG tablet ?Commonly known as: COGENTIN ?Take 0.5 mg by mouth every morning. ?  ?bethanechol 5 MG tablet ?Commonly known as: URECHOLINE ?TAKE 1 TABLET BY MOUTH TWICE A DAY ?What changed: when to take this ?  ?cloZAPine 100 MG tablet ?Commonly known as: CLOZARIL ?Take 400 mg by mouth at bedtime. ?  ?finasteride 5 MG tablet ?Commonly known as: PROSCAR ?TAKE 1 TABLET BY MOUTH EVERY DAY ?  ?multivitamin with minerals Tabs tablet ?Take 1 tablet by mouth daily. ?  ?polyethylene glycol 17 g packet ?Commonly known as: MIRALAX / GLYCOLAX ?Take 17 g by mouth daily as needed for moderate  constipation. ?  ?Potassium 99 MG Tabs ?Take 99 mg by mouth daily. ?  ?tamsulosin 0.4 MG Caps capsule ?Commonly known as: FLOMAX ?Take 1 capsule (0.4 mg total) by mouth daily. ?Start taking on: November 17, 2021 ?  ?temazepam 15 MG capsule ?Commonly known as: RESTORIL ?Take 15 mg by mouth at bedtime. ?  ?vitamin C 1000 MG tablet ?Take 1,000 mg by mouth daily. ?  ?Vitamin D 50 MCG (2000 UT) Caps ?Take 2,000 Units by mouth daily. ?  ?vitamin E 180 MG (400 UNITS) capsule ?Take 400 Units by mouth daily. ?  ?Vraylar 6 MG Caps ?Generic drug: Cariprazine HCl ?Take 6 mg by mouth every morning. ?  ?zinc gluconate 50 MG tablet ?Take 50 mg by mouth daily. ?  ? ?  ? ? Follow-up Information   ? ? [facility] Follow up.   ?Why: We will call to set up an appointment for you to see [place] in the office.  Leave your catheter in until then. ?Contact information: ?[address] ?[place] ?[phone] ? ?  ?  ? ? [facility]. Schedule an appointment as soon as possible for a visit.   ?Specialty: Pulmonology ?Contact information: ?[address] ?[place] [address] ?[phone] ? ?  ?  ? ? [facility] Follow up in 2 week(s).   ?  Specialty: Internal Medicine ?Contact information: ?[address] ?[place] [address] ?[phone] ? ? ?  ?  ? ?  ?  ? ?  ? ?Discharge Exam: ?Filed Weights  ? 11/12/21 0500 11/13/21 0331 11/14/21 0600  ?Weight: 95.1 kg 93.2 kg 93.4 kg  ? ?General-appears in no acute distress ?Heart-S1-S2, regular, no murmur auscultated ?Lungs-clear to auscultation bilaterally, no wheezing or crackles auscultated ?Abdomen-soft, nontender, no organomegaly ?Extremities-no edema in the lower extremities ?Neuro-alert, oriented x3, no focal deficit noted ? ?Condition at discharge: good ? ?The results of significant diagnostics from this hospitalization (including imaging, microbiology, ancillary and laboratory) are listed below for reference.   ? ?Imaging Studies: ?CT ABDOMEN PELVIS WO CONTRAST ? ?Result Date: 11/08/2021 ?CLINICAL DATA:  66 year old male with sepsis, urinary retention, and leg swelling. EXAM: CT ABDOMEN AND PELVIS WITHOUT CONTRAST TECHNIQUE:

## 2021-11-16 NOTE — Evaluation (Signed)
Occupational Therapy Evaluation ?Patient Details ?Name: Edwin [name] ?MRN: [id number] ?DOB: 30-Mar-1956 ?Today's Date: 11/16/2021 ? ? ?History of Present Illness 66 yo male admitted with AMS, respiratory failure, UTI, sepsis. Hx of schizophrenia, prostate ca, CKD,TURP  ? ?Clinical Impression ?  ?[name] is a 66 year old man who presents with above medical history. On evaluation he demonstrates good upper body strength but generalized weakness, decreased activity tolerance and impaired balance. He required min guard for ambulation due to mild unsteadiness and ADLs but had no overt loss of balance. He reports numbness in feet. Patient ambulated short distance in hall on RA and o2 sat 90% and rebounded quickly. No dyspnea noted. Recommend at least intermittent assistance and HH services at discharge. He reports he has a niece and a brother to assist him.  ?   ? ?Recommendations for follow up therapy are one component of a multi-disciplinary discharge planning process, led by the attending physician.  Recommendations may be updated based on patient status, additional functional criteria and insurance authorization.  ? ?Follow Up Recommendations ? Home health OT  ?  ?Assistance Recommended at Discharge Intermittent Supervision/Assistance  ?Patient can return home with the following Assistance with cooking/housework;Direct supervision/assist for financial management;Direct supervision/assist for medications management;Help with stairs or ramp for entrance ? ?  ?Functional Status Assessment ? Patient has had a recent decline in their functional status and demonstrates the ability to make significant improvements in function in a reasonable and predictable amount of time.  ?Equipment Recommendations ? Tub/shower seat  ?  ?Recommendations for Other Services   ? ? ?  ?Precautions / Restrictions Precautions ?Precautions: Fall ?Restrictions ?Weight Bearing Restrictions: No  ? ?  ? ?Mobility Bed Mobility ?Overal bed  mobility: Modified Independent ?  ?  ?  ?  ?  ?  ?  ?  ? ?Transfers ?Overall transfer level: Needs assistance ?  ?Transfers: Sit to/from Stand ?Sit to Stand: Min guard ?  ?  ?  ?  ?  ?General transfer comment: Overall min guard for in room and short bout of ambulation. Mildly unsteady. Reports numbness in feet that is new. ?  ? ?  ?Balance Overall balance assessment: Mild deficits observed, not formally tested ?  ?  ?  ?  ?  ?  ?  ?  ?  ?  ?  ?  ?  ?  ?  ?  ?  ?  ?   ? ?ADL either performed or assessed with clinical judgement  ? ?ADL Overall ADL's : Needs assistance/impaired ?Eating/Feeding: Independent ?  ?Grooming: Min guard;Standing ?  ?Upper Body Bathing: Set up;Sitting ?  ?Lower Body Bathing: Min guard;Sit to/from stand ?  ?Upper Body Dressing : Set up;Sitting ?  ?Lower Body Dressing: Min guard;Sit to/from stand ?  ?Toilet Transfer: Min guard;Regular Toilet;Grab bars ?  ?Toileting- Water quality scientist and Hygiene: Min guard;Sit to/from stand ?  ?  ?  ?Functional mobility during ADLs: Min guard ?General ADL Comments: Min guard for ambulation. His gait is slightly unsteady but he had no overt loss of balance. Able to don socks and demonstrated good upper body strength. o2 sat 90% on RA with short ambulation with no complaints of dypsnea. Patietn reports his feet feel numb and that it was a new sensation.  ? ? ? ?Vision Patient Visual Report: No change from baseline ?   ?   ?Perception   ?  ?Praxis   ?  ? ?Pertinent Vitals/Pain Pain Assessment ?Pain  Assessment: No/denies pain  ? ? ? ?Hand Dominance Right ?  ?Extremity/Trunk Assessment Upper Extremity Assessment ?Upper Extremity Assessment: RUE deficits/detail;LUE deficits/detail ?RUE Deficits / Details: WFL ROM, 5/5 strength ?RUE Sensation: WNL ?RUE Coordination: WNL ?LUE Deficits / Details: WFL ROM, shouler hiking noted, 5/5 strength ?LUE Sensation: WNL ?LUE Coordination: WNL ?  ?Lower Extremity Assessment ?Lower Extremity Assessment: Defer to PT evaluation ?   ?Cervical / Trunk Assessment ?Cervical / Trunk Assessment: Normal ?  ?Communication Communication ?Communication: No difficulties (soft spoken) ?  ?Cognition Arousal/Alertness: Awake/alert ?Behavior During Therapy: Flat affect ?Overall Cognitive Status: Within Functional Limits for tasks assessed ?  ?  ?  ?  ?  ?  ?  ?  ?  ?  ?  ?  ?  ?  ?  ?  ?  ?  ?  ?General Comments    ? ?  ?Exercises   ?  ?Shoulder Instructions    ? ? ?Home Living Family/patient expects to be discharged to:: Private residence ?Living Arrangements: Other relatives ?Available Help at Discharge: Family;Available PRN/intermittently ?Type of Home: House ?Home Access: Stairs to enter ?Entrance Stairs-Number of Steps: 2 ?  ?Home Layout: One level ?  ?  ?Bathroom Shower/Tub: Walk-in shower ?  ?Bathroom Toilet: Standard ?  ?  ?Home Equipment: None ?  ?  ?  ? ?  ?Prior Functioning/Environment Prior Level of Function : Independent/Modified Independent ?  ?  ?  ?  ?  ?  ?  ?  ?  ? ?  ?  ?OT Problem List: Impaired balance (sitting and/or standing) ?  ?   ?OT Treatment/Interventions:    ?  ?OT Goals(Current goals can be found in the care plan section) Acute Rehab OT Goals ?OT Goal Formulation: All assessment and education complete, DC therapy  ?OT Frequency:   ?  ? ?Co-evaluation   ?  ?  ?  ?  ? ?  ?AM-PAC OT "6 Clicks" Daily Activity     ?Outcome Measure Help from another person eating meals?: None ?Help from another person taking care of personal grooming?: A Little ?Help from another person toileting, which includes using toliet, bedpan, or urinal?: A Little ?Help from another person bathing (including washing, rinsing, drying)?: A Little ?Help from another person to put on and taking off regular upper body clothing?: A Little ?Help from another person to put on and taking off regular lower body clothing?: A Little ?6 Click Score: 19 ?  ?End of Session Nurse Communication: Mobility status ? ?Activity Tolerance: Patient tolerated treatment well ?Patient  left: in chair;with call bell/phone within reach ? ?OT Visit Diagnosis: Unsteadiness on feet (R26.81)  ?              ?Time: 5409-8119 ?OT Time Calculation (min): 19 min ?Charges:  OT General Charges ?$OT Visit: 1 Visit ?OT Evaluation ?$OT Eval Low Complexity: 1 Low ? ?Eleri Ruben, OTR/L ?Acute Care Rehab Services  ?Office [phone] ?Pager: [phone]  ? ?Edwin [name] ?11/16/2021, 4:18 PM ?

## 2021-11-21 ENCOUNTER — Ambulatory Visit: Payer: Self-pay | Admitting: *Deleted

## 2021-11-21 NOTE — ED Notes (Signed)
Pt removed by registration as requested by CN ?

## 2021-11-21 NOTE — Telephone Encounter (Signed)
?  Chief Complaint: Foley catheter has a hole in the bag.  Urine is leaking out.  Seeking advice. ?Symptoms: Doesn't know how a hole got into the bag.   Catheter was inserted in the ED. ?Frequency: Now ?Pertinent Negatives: Patient denies knowing how the hole got there. ?Disposition: '[x]'$ ED /'[]'$ Urgent Care (no appt availability in office) / '[]'$ Appointment(In office/virtual)/ '[]'$  [place] [facility]/ '[]'$ Home Care/ '[]'$ Refused Recommended Disposition /'[]'$ [place] [facility]/ '[]'$  Follow-up with PCP ?Additional Notes: Pt was agreeable to returning to the ED where it was inserted to have it replaced.    ?

## 2021-11-21 NOTE — Telephone Encounter (Signed)
Reason for Disposition ? [1] Catheter is broken AND [2] is not usable ?   Referred back to the ED where it was inserted since he doesn't have a home health nurse. ? ?Answer Assessment - Initial Assessment Questions ?1. SYMPTOMS: "What symptoms are you concerned about?" ?    Friend calling saying pt's urinary bag has busted.   Pt in background answering questions.    "There's a hole in the bag".    The phone line disconnected.    I called him back.   ?He answered and said his catheter bag has a hole in it.   He does not have a home health nurse that comes out when I asked him.   It was inserted in the ED.  ?I instructed him to return to the ED to have it replaced.   He was agreeable to doing this and thanked me for myhelp. ? ? ?2. ONSET:  "When did the symptoms start?" ?    Today ?3. FEVER: "Is there a fever?" If Yes, ask: "What is the temperature, how was it measured, and when did it start?" ?    Not asked ?4. ABDOMINAL PAIN: "Is there any abdominal pain?" (e.g., Scale 1-10; or mild, moderate, severe) ?    Not asked ?5. URINE COLOR: "What color is the urine?"  "Is there blood present in the urine?" (e.g., clear, yellow, cloudy, tea-colored, blood streaks, bright red) ?    *No Answer* ?6. URINE AMOUNT: "When did you last empty the urine from the collection bag?" "How much urine was in the bag at that time?" How much urine is in the collection bag now?" ?    *No Answer* ?7. INSERTION: "How long have you (they) had the catheter?" ?    *No Answer* ?8. OTHER SYMPTOMS: "Do you (they) have any other symptoms?" (e.g., back pain, bad urine odor,  bladder spasms, leaking of urine, constipation, distended abdomen)  ?    *No Answer* ?9. MEDICINES: "Are you taking any medicines to treat urinary problems?" (e.g., antibiotics for a urinary tract infection, medicines to treat bladder spasms)  ?    *No Answer* ?10. PREGNANCY: "Is there any chance you are pregnant?" "When was your last menstrual period?" ?      *No  Answer* ? ?Protocols used: Urinary Catheter (e.g., Foley) Symptoms and Questions-A-AH ? ?

## 2021-11-28 ENCOUNTER — Emergency Department (HOSPITAL_COMMUNITY)
Admission: EM | Admit: 2021-11-28 | Discharge: 2021-11-28 | Disposition: A | Discharge status: Home / Self Care | Payer: 59 | Attending: Emergency Medicine | Admitting: Emergency Medicine

## 2021-11-28 ENCOUNTER — Other Ambulatory Visit: Payer: Self-pay

## 2021-11-28 ENCOUNTER — Encounter (HOSPITAL_COMMUNITY): Payer: Self-pay

## 2021-11-28 DIAGNOSIS — N189 Chronic kidney disease, unspecified: Secondary | ICD-10-CM | POA: Insufficient documentation

## 2021-11-28 DIAGNOSIS — Z7982 Long term (current) use of aspirin: Secondary | ICD-10-CM | POA: Insufficient documentation

## 2021-11-28 DIAGNOSIS — T839XXD Unspecified complication of genitourinary prosthetic device, implant and graft, subsequent encounter: Secondary | ICD-10-CM

## 2021-11-28 DIAGNOSIS — T83098D Other mechanical complication of other indwelling urethral catheter, subsequent encounter: Secondary | ICD-10-CM | POA: Insufficient documentation

## 2021-11-28 DIAGNOSIS — Z8546 Personal history of malignant neoplasm of prostate: Secondary | ICD-10-CM | POA: Diagnosis not present

## 2021-11-28 NOTE — ED Provider Triage Note (Signed)
Emergency Medicine Provider Triage Evaluation Note ? ?Edwin [name] , a 66 y.o. male  was evaluated in triage.  Pt complains of foley catheter bag leaking since 4:30pm today. Recently had similar leakage on 4/25, as well as pulled his catheter out on 4/12. Foley originally placed on 4/7 for urinary retention. Hx of paranoid schizophrenia, CKD, and prostate cancer ? ?Review of Systems  ?Positive: Leaking foley bag ?Negative: Abdominal pain, fever ? ?Physical Exam  ?BP (!) 144/93 (BP Location: Right Arm)   Pulse (!) 114   Temp 98.1 ?F (36.7 ?C) (Oral)   Resp 16   Ht '5\' 11"'$  (1.803 m)   Wt 100.2 kg   SpO2 95%   BMI 30.82 kg/m?  ?Gen:   Awake, no distress   ?Resp:  Normal effort  ?MSK:   Moves extremities without difficulty  ?Other:  Foley bag leaking in lobby, down hallway, and in triage room. Unsure if bag was cut, leaking from the bottom ? ?Medical Decision Making  ?Medically screening exam initiated at 5:48 PM.  Appropriate orders placed.  Edwin [name] was informed that the remainder of the evaluation will be completed by another provider, this initial triage assessment does not replace that evaluation, and the importance of remaining in the ED until their evaluation is complete. ? ?Leg bag replaced in triage ?  ?Kateri Plummer, PA-C ?11/28/21 1751 ? ?

## 2021-11-28 NOTE — Discharge Instructions (Addendum)
We have replaced your foley catheter bag. ? ?Please follow up with your primary doctor regarding your medicine. ?

## 2021-11-28 NOTE — ED Notes (Signed)
PT given discharge instructions and recommended to follow up with his primary care provider. Pt understood. ?

## 2021-11-28 NOTE — ED Notes (Signed)
Pt leg bag exchanged  ?

## 2021-11-28 NOTE — ED Provider Notes (Signed)
?[facility] DEPT ?Provider Note ? ? ?CSN: [id number] ?Arrival date & time: 11/28/21  1610 ? ?  ? ?History ? ?No chief complaint on file. ? ? ?Edwin [name] is a 66 y.o. male with history of paranoid schizophrenia, HLD, prostate cancer, chronic kidney disease s/p Foley catheter placed in April for urinary incontinence.  He presents today complaining that his Foley catheter leg bag is leaking.  States has been leaking since about 4:30 this afternoon.  He has no other complaints at this time. ? ?HPI ? ?  ? ?Home Medications ?Prior to Admission medications   ?Medication Sig Start Date End Date Taking? Authorizing Provider  ?acetaminophen (TYLENOL) 500 MG tablet Take 2 tablets (1,000 mg total) by mouth every 6 (six) hours as needed for moderate pain or headache. ?Patient not taking: Reported on 11/08/2021 08/14/17   Elodia Florence., MD  ?albuterol (VENTOLIN HFA) 108 (90 Base) MCG/ACT inhaler Inhale 2 puffs into the lungs every 6 (six) hours as needed for wheezing or shortness of breath. 11/16/21   [name], MD  ?Ascorbic Acid (VITAMIN C) 1000 MG tablet Take 1,000 mg by mouth daily.    [provider]  ?aspirin EC 81 MG tablet Take 81 mg by mouth daily.    [provider]  ?benztropine (COGENTIN) 0.5 MG tablet Take 0.5 mg by mouth every morning. 12/13/14   [provider]  ?bethanechol (URECHOLINE) 5 MG tablet TAKE 1 TABLET BY MOUTH TWICE A DAY ?Patient taking differently: Take 5 mg by mouth in the morning and at bedtime. 07/04/21   McKenzie, Candee Furbish, MD  ?Cholecalciferol (VITAMIN D) 50 MCG (2000 UT) CAPS Take 2,000 Units by mouth daily.    [provider]  ?cloZAPine (CLOZARIL) 100 MG tablet Take 400 mg by mouth at bedtime. 11/03/21   [provider]  ?finasteride (PROSCAR) 5 MG tablet TAKE 1 TABLET BY MOUTH EVERY DAY ?Patient taking differently: Take 5 mg by mouth daily. 05/17/20   McKenzie, Candee Furbish, MD  ?Multiple Vitamin (MULTIVITAMIN  WITH MINERALS) TABS tablet Take 1 tablet by mouth daily.    [provider]  ?polyethylene glycol (MIRALAX / GLYCOLAX) 17 g packet Take 17 g by mouth daily as needed for moderate constipation. 11/16/21   [name], MD  ?Potassium 99 MG TABS Take 99 mg by mouth daily.    [provider]  ?tamsulosin (FLOMAX) 0.4 MG CAPS capsule Take 1 capsule (0.4 mg total) by mouth daily. 11/17/21   [name], MD  ?temazepam (RESTORIL) 15 MG capsule Take 15 mg by mouth at bedtime. 06/14/16   [provider]  ?vitamin E 180 MG (400 UNITS) capsule Take 400 Units by mouth daily.    [provider]  ?VRAYLAR 6 MG CAPS Take 6 mg by mouth every morning. 10/24/21   [provider]  ?zinc gluconate 50 MG tablet Take 50 mg by mouth daily.    [provider]  ?   ? ?Allergies    ?Patient has no known allergies.   ? ?Review of Systems   ?Review of Systems  ?Constitutional:  Negative for fever.  ?Gastrointestinal:  Negative for abdominal pain.  ?Genitourinary:  Negative for dysuria, flank pain, frequency and hematuria.  ?     Foley leg bag leaking  ?All other systems reviewed and are negative. ? ?Physical Exam ?Updated Vital Signs ?BP 138/90 (BP Location: Right Arm)   Pulse 97   Temp 98.1 ?F (36.7 ?  C) (Oral)   Resp 19   Ht '5\' 11"'$  (1.803 m)   Wt 100.2 kg   SpO2 99%   BMI 30.82 kg/m?  ?Physical Exam ?Vitals and nursing note reviewed.  ?Constitutional:   ?   Appearance: Normal appearance.  ?HENT:  ?   Head: Normocephalic and atraumatic.  ?Eyes:  ?   Conjunctiva/sclera: Conjunctivae normal.  ?Pulmonary:  ?   Effort: Pulmonary effort is normal. No respiratory distress.  ?Genitourinary: ?   Comments: Foley bag leaking in lobby, down hallway, and in triage room. Unsure if bag was cut, leaking from the bottom ?Skin: ?   General: Skin is warm and dry.  ?Neurological:  ?   Mental Status: He is alert.  ?Psychiatric:     ?   Mood and Affect: Mood normal.     ?   Behavior: Behavior normal.   ? ? ?ED Results / Procedures / Treatments   ?Labs ?(all labs ordered are listed, but only abnormal results are displayed) ?Labs Reviewed - No data to display ? ?EKG ?None ? ?Radiology ?No results found. ? ?Procedures ?Procedures  ? ? ?Medications Ordered in ED ?Medications - No data to display ? ?ED Course/ Medical Decision Making/ A&P ?  ?                        ?Medical Decision Making ?This patient is a 66 year old male who presents to the ED for concern of foley catheter leg bag leaking.  ? ?Past Medical History / Co-morbidities / Social History: ?paranoid schizophrenia, HLD, prostate cancer, chronic kidney disease s/p Foley catheter placed in April for urinary incontinence ? ?Additional history: ?Chart reviewed. Pertinent results include: Recently had similar leakage on 4/25, as well as pulled his catheter out on 4/12. Foley originally placed on 4/7 for urinary retention. ? ?Physical Exam: ?Physical exam performed. The pertinent findings include: Normal vital signs. In no acute distress. Clinically well appearing. Foley bad leaking from the bottom. ? ?Procedures: ?Leg bag replaced. Foley catheter otherwise stable.  ?  ?Disposition: ?After consideration of the diagnostic results and the patients response to treatment, I feel that patient is not requiring admission or inpatient treatment for his symptoms. Leg bag replaced and patient has no other complaints. Discussed reasons to return to the emergency department, and the patient is agreeable to the plan.  ? ? ? ? ? ? ? ?Final Clinical Impression(s) / ED Diagnoses ?Final diagnoses:  ?Problem with Foley catheter, subsequent encounter  ? ? ?Rx / DC Orders ?ED Discharge Orders   ? ? None  ? ?  ? ?Portions of this report may have been transcribed using voice recognition software. Every effort was made to ensure accuracy; however, inadvertent computerized transcription errors may be present. ? ?  ?Kateri Plummer, PA-C ?11/28/21 1858 ? ?  ?Lorelle Gibbs,  DO ?11/29/21 0002 ? ?

## 2021-11-28 NOTE — ED Triage Notes (Signed)
Patient reports that his foley cath bag is leaking since 1630 and states it is leaking from the bottom. ?

## 2021-11-29 ENCOUNTER — Encounter: Payer: Self-pay | Admitting: Pulmonary Disease

## 2021-11-29 ENCOUNTER — Ambulatory Visit (INDEPENDENT_AMBULATORY_CARE_PROVIDER_SITE_OTHER): Payer: 59 | Admitting: Pulmonary Disease

## 2021-11-29 VITALS — BP 132/82 | HR 97 | Temp 98.0°F | Ht 71.0 in | Wt 217.4 lb

## 2021-11-29 DIAGNOSIS — J411 Mucopurulent chronic bronchitis: Secondary | ICD-10-CM

## 2021-11-29 MED ORDER — COMBIVENT RESPIMAT 20-100 MCG/ACT IN AERS: 1.0000 | INHALATION_SPRAY | Freq: Four times a day (QID) | RESPIRATORY_TRACT | 5 refills | Status: DC | PRN

## 2021-11-29 NOTE — Progress Notes (Signed)
? ?[name] ? ?Chief Complaint  ?Patient presents with  ? Consult  ?  Patient is here to talk about blood clot in lung  ? ? ?Past Surgical History:  ?He  has a past surgical history that includes Cystoscopy (N/A, 03/08/2015); Transurethral resection of bladder tumor (N/A, 03/08/2015); Prostate biopsy (N/A, 03/08/2015); Cervical disc surgery (1990's); Cystoscopy (06/10/2015); transthoracic echocardiogram (06-11-2015); Transurethral resection of prostate (N/A, 06/27/2015); and Transurethral resection of prostate (N/A, 07/09/2016). ? ?Past Medical History:  ?Anxiety, Prostate cancer, CKD 3, Depression, GERD, C diff 2016, HLD, Schizophrenia ? ?Constitutional:  ?BP 132/82 (BP Location: Right Arm, Patient Position: Sitting, Cuff Size: Normal)   Pulse 97   Temp 98 ?F (36.7 ?C) (Oral)   Ht '5\' 11"'$  (1.803 m)   Wt 217 lb 6.4 oz (98.6 kg)   SpO2 98%   BMI 30.32 kg/m?  ? ?Brief Summary:  ?Edwin [name] is a 66 y.o. male smoker with chronic bronchitis. ?  ? ? ? ?Subjective:  ? ?He is here with his girlfriend. ? ?He was in hospital recently for sepsis from UTI with Klebsiella bactermia, and hypoxia from pulmonary edema and atelectasis and possible pulmonary embolism.  There was concern he could also have obstructive lung disease and had outpt pulmonary assessment arranged. ? ?He had V/Q scan which was concerning for  a PE.  He had CT angiogram that was negative for PE and Echo showed normal LV and RV function. ? ?He smoked 2 ppd and is down to 1 ppd.  He tried vaping but didn't like this.  He plans to gradually quit. ? ?He has cough with clear to yellow sputum.  His voice is always hoarse.  Doesn't have wheeze, chest tightness, or shortness of breath.  Sleeps okay.  Didn't need supplemental oxygen after hospital d/c. ? ?Physical Exam:  ? ?Appearance - well kempt  ? ?ENMT - no sinus tenderness, no oral exudate, no LAN, Mallampati 3 airway, no stridor, decreased voice quality with raspy  voice ? ?Respiratory - equal breath sounds bilaterally, no wheezing or rales ? ?CV - s1s2 regular rate and rhythm, no murmurs ? ?Ext - no clubbing, no edema ? ?Skin - no rashes ? ?Psych - normal mood and affect ?  ?Pulmonary testing:  ? ? ?Chest Imaging:  ?V/Q scan 11/11/21 >> focal perfusion defect superior segment of LLL ?CT angio chest 11/15/21 >> no PE, small b/l effusions with ATX ? ?Sleep Tests:  ? ? ?Cardiac Tests:  ?Echo 11/10/21 >> EF 50 to 55% ? ?Social History:  ?He  reports that he has been smoking cigarettes. He has a 80.00 pack-year smoking history. He has never used smokeless tobacco. He reports that he does not drink alcohol and does not use drugs. ? ?Family History:  ?His family history includes Breast cancer in his mother. ?  ? ? ?Assessment/Plan:  ? ?Chronic bronchitis. ?- prn combivent ?- will arrange for pulmonary function test to assess for presence of COPD ? ?Tobacco abuse. ?- he will try nicotine replacement and gradually quitting ? ?Possible pulmonary embolism. ?- this was effectively ruled out based on CT angiogram chest and Echocardiogram findings ? ?Time Spent Involved in Patient Care on Day of Examination:  ?37 minutes ? ?Follow up:  ? ?Patient Instructions  ?Combivent 1 puff every 6 hours as needed for cough, wheeze, chest congestion, or shortness of breath ? ?Will arrange for pulmonary function test ? ?Follow up in 8 weeks ? ?Medication List:  ? ?  Allergies as of 11/29/2021   ?No Known Allergies ?  ? ?  ?Medication List  ?  ? ?  ? Accurate as of Nov 29, 2021 11:07 AM. If you have any questions, ask your nurse or doctor.  ?  ?  ? ?  ? ?STOP taking these medications   ? ?acetaminophen 500 MG tablet ?Commonly known as: TYLENOL ?Stopped by: Chesley Mires, MD ?  ?albuterol 108 (90 Base) MCG/ACT inhaler ?Commonly known as: VENTOLIN HFA ?Stopped by: Chesley Mires, MD ?  ? ?  ? ?TAKE these medications   ? ?aspirin EC 81 MG tablet ?Take 81 mg by mouth daily. ?  ?benztropine 0.5 MG tablet ?Commonly known  as: COGENTIN ?Take 0.5 mg by mouth every morning. ?  ?bethanechol 5 MG tablet ?Commonly known as: URECHOLINE ?TAKE 1 TABLET BY MOUTH TWICE A DAY ?What changed: when to take this ?  ?cloZAPine 100 MG tablet ?Commonly known as: CLOZARIL ?Take 400 mg by mouth at bedtime. ?  ?Combivent Respimat 20-100 MCG/ACT Aers respimat ?Generic drug: Ipratropium-Albuterol ?Inhale 1 puff into the lungs every 6 (six) hours as needed for wheezing or shortness of breath. ?Started by: Chesley Mires, MD ?  ?finasteride 5 MG tablet ?Commonly known as: PROSCAR ?TAKE 1 TABLET BY MOUTH EVERY DAY ?  ?multivitamin with minerals Tabs tablet ?Take 1 tablet by mouth daily. ?  ?polyethylene glycol 17 g packet ?Commonly known as: MIRALAX / GLYCOLAX ?Take 17 g by mouth daily as needed for moderate constipation. ?  ?Potassium 99 MG Tabs ?Take 99 mg by mouth daily. ?  ?tamsulosin 0.4 MG Caps capsule ?Commonly known as: FLOMAX ?Take 1 capsule (0.4 mg total) by mouth daily. ?  ?temazepam 15 MG capsule ?Commonly known as: RESTORIL ?Take 15 mg by mouth at bedtime. ?  ?vitamin C 1000 MG tablet ?Take 1,000 mg by mouth daily. ?  ?Vitamin D 50 MCG (2000 UT) Caps ?Take 2,000 Units by mouth daily. ?  ?vitamin E 180 MG (400 UNITS) capsule ?Take 400 Units by mouth daily. ?  ?Vraylar 6 MG Caps ?Generic drug: Cariprazine HCl ?Take 6 mg by mouth every morning. ?  ?zinc gluconate 50 MG tablet ?Take 50 mg by mouth daily. ?  ? ?  ? ? ?Signature:  ?Chesley Mires, MD ?[place] ?Pager - [phone] - [phone] ?11/29/2021, 11:07 AM ?  ? ? ? ? ? ? ? ? ?

## 2021-11-29 NOTE — Patient Instructions (Signed)
Combivent 1 puff every 6 hours as needed for cough, wheeze, chest congestion, or shortness of breath ? ?Will arrange for pulmonary function test ? ?Follow up in 8 weeks ?

## 2021-12-12 ENCOUNTER — Other Ambulatory Visit: Payer: Self-pay | Admitting: Internal Medicine

## 2021-12-13 LAB — COMPLETE METABOLIC PANEL WITH GFR
AG Ratio: 1.8 (calc) (ref 1.0–2.5)
ALT: 21 U/L (ref 9–46)
AST: 17 U/L (ref 10–35)
Albumin: 4.4 g/dL (ref 3.6–5.1)
Alkaline phosphatase (APISO): 86 U/L (ref 35–144)
BUN/Creatinine Ratio: 9 (calc) (ref 6–22)
BUN: 13 mg/dL (ref 7–25)
CO2: 24 mmol/L (ref 20–32)
Calcium: 9.5 mg/dL (ref 8.6–10.3)
Chloride: 104 mmol/L (ref 98–110)
Creat: 1.5 mg/dL — ABNORMAL HIGH (ref 0.70–1.35)
Globulin: 2.4 g/dL (calc) (ref 1.9–3.7)
Glucose, Bld: 92 mg/dL (ref 65–99)
Potassium: 4.1 mmol/L (ref 3.5–5.3)
Sodium: 142 mmol/L (ref 135–146)
Total Bilirubin: 0.3 mg/dL (ref 0.2–1.2)
Total Protein: 6.8 g/dL (ref 6.1–8.1)
eGFR: 52 mL/min/{1.73_m2} — ABNORMAL LOW (ref >=60)

## 2021-12-13 LAB — CBC
HCT: 49.2 % (ref 38.5–50.0)
Hemoglobin: 16.2 g/dL (ref 13.2–17.1)
MCH: 27.6 pg (ref 27.0–33.0)
MCHC: 32.9 g/dL (ref 32.0–36.0)
MCV: 83.8 fL (ref 80.0–100.0)
MPV: 10.5 fL (ref 7.5–12.5)
Platelets: 233 10*3/uL (ref 140–400)
RBC: 5.87 10*6/uL — ABNORMAL HIGH (ref 4.20–5.80)
RDW: 14 % (ref 11.0–15.0)
WBC: 8.9 10*3/uL (ref 3.8–10.8)

## 2021-12-13 LAB — VITAMIN D 25 HYDROXY (VIT D DEFICIENCY, FRACTURES): Vit D, 25-Hydroxy: 19 ng/mL — ABNORMAL LOW (ref 30–100)

## 2021-12-13 LAB — LIPID PANEL
Cholesterol: 251 mg/dL — ABNORMAL HIGH (ref <200)
HDL: 41 mg/dL (ref >=40)
LDL Cholesterol (Calc): 154 mg/dL (calc) — ABNORMAL HIGH
Non-HDL Cholesterol (Calc): 210 mg/dL (calc) — ABNORMAL HIGH (ref <130)
Total CHOL/HDL Ratio: 6.1 (calc) — ABNORMAL HIGH (ref <5.0)
Triglycerides: 367 mg/dL — ABNORMAL HIGH (ref <150)

## 2021-12-13 LAB — TSH: TSH: 0.8 mIU/L (ref 0.40–4.50)

## 2022-01-22 ENCOUNTER — Ambulatory Visit (INDEPENDENT_AMBULATORY_CARE_PROVIDER_SITE_OTHER): Payer: 59 | Admitting: Pulmonary Disease

## 2022-01-22 DIAGNOSIS — J411 Mucopurulent chronic bronchitis: Secondary | ICD-10-CM | POA: Diagnosis not present

## 2022-01-22 LAB — PULMONARY FUNCTION TEST
DL/VA % pred: 133 %
DL/VA: 5.54 ml/min/mmHg/L
DLCO cor % pred: 65 %
DLCO cor: 18.14 ml/min/mmHg
DLCO unc % pred: 68 %
DLCO unc: 18.91 ml/min/mmHg
FEF 25-75 Post: 0.46 L/sec
FEF 25-75 Pre: 1.64 L/sec
FEF2575-%Change-Post: -71 %
FEF2575-%Pred-Post: 16 %
FEF2575-%Pred-Pre: 57 %
FEV1-%Change-Post: -17 %
FEV1-%Pred-Post: 41 %
FEV1-%Pred-Pre: 51 %
FEV1-Post: 1.5 L
FEV1-Pre: 1.82 L
FEV1FVC-%Change-Post: 7 %
FEV1FVC-%Pred-Pre: 104 %
FEV6-%Change-Post: -21 %
FEV6-%Pred-Post: 39 %
FEV6-%Pred-Pre: 50 %
FEV6-Post: 1.77 L
FEV6-Pre: 2.27 L
FEV6FVC-%Change-Post: 2 %
FEV6FVC-%Pred-Post: 105 %
FEV6FVC-%Pred-Pre: 102 %
FVC-%Change-Post: -23 %
FVC-%Pred-Post: 37 %
FVC-%Pred-Pre: 48 %
FVC-Post: 1.77 L
FVC-Pre: 2.33 L
Post FEV1/FVC ratio: 85 %
Post FEV6/FVC ratio: 100 %
Pre FEV1/FVC ratio: 78 %
Pre FEV6/FVC Ratio: 97 %
RV % pred: 80 %
RV: 1.88 L
TLC % pred: 55 %
TLC: 3.93 L

## 2022-01-24 ENCOUNTER — Ambulatory Visit: Payer: 59 | Admitting: Nurse Practitioner

## 2022-02-01 ENCOUNTER — Ambulatory Visit (INDEPENDENT_AMBULATORY_CARE_PROVIDER_SITE_OTHER): Payer: 59 | Admitting: Nurse Practitioner

## 2022-02-01 ENCOUNTER — Ambulatory Visit (INDEPENDENT_AMBULATORY_CARE_PROVIDER_SITE_OTHER): Payer: 59

## 2022-02-01 ENCOUNTER — Encounter: Payer: Self-pay | Admitting: Nurse Practitioner

## 2022-02-01 DIAGNOSIS — J984 Other disorders of lung: Secondary | ICD-10-CM

## 2022-02-01 DIAGNOSIS — Z720 Tobacco use: Secondary | ICD-10-CM | POA: Diagnosis not present

## 2022-02-01 DIAGNOSIS — R600 Localized edema: Secondary | ICD-10-CM | POA: Diagnosis not present

## 2022-02-01 DIAGNOSIS — F17200 Nicotine dependence, unspecified, uncomplicated: Secondary | ICD-10-CM | POA: Insufficient documentation

## 2022-02-01 DIAGNOSIS — R053 Chronic cough: Secondary | ICD-10-CM | POA: Diagnosis not present

## 2022-02-01 LAB — BASIC METABOLIC PANEL
BUN: 14 mg/dL (ref 6–23)
CO2: 29 mEq/L (ref 19–32)
Calcium: 9.4 mg/dL (ref 8.4–10.5)
Chloride: 102 mEq/L (ref 96–112)
Creatinine, Ser: 1.65 mg/dL — ABNORMAL HIGH (ref 0.40–1.50)
GFR: 43.88 mL/min — ABNORMAL LOW (ref >60.00)
Glucose, Bld: 102 mg/dL — ABNORMAL HIGH (ref 70–99)
Potassium: 3.5 mEq/L (ref 3.5–5.1)
Sodium: 138 mEq/L (ref 135–145)

## 2022-02-01 LAB — BRAIN NATRIURETIC PEPTIDE: Pro B Natriuretic peptide (BNP): 14 pg/mL (ref 0.0–100.0)

## 2022-02-01 LAB — POCT EXHALED NITRIC OXIDE: FeNO level (ppb): 5

## 2022-02-01 NOTE — Assessment & Plan Note (Signed)
Increased BLE edema over the past month. Echo from April showed low normal EF. We will check BNP and BMET today. He thought he was started on a diuretic by his PCP but they are unable to recall the name. There is not one that I see on his med list. His girlfriend is going to check when he gets home and we will discuss once we get his lab results.

## 2022-02-01 NOTE — Patient Instructions (Addendum)
Continue Combivent 1 puff every 6 hours as needed for shortness of breath or wheezing  Mucinex DM Twice daily for cough/congestion   Chest x ray and labs today  High resolution CT scan ordered  Follow up after HRCT with Dr. Halford Chessman or Alanson Aly. If symptoms do not improve or worsen, please contact office for sooner follow up or seek emergency care.

## 2022-02-01 NOTE — Assessment & Plan Note (Addendum)
Chronic cough. Slight increase over the past month. FeNO nl today and no obstruction on PFTs. Likely smoker's cough but concern for possible underlying ILD given recent PFTs. He also has increased BLE edema so I am concerned that this could be contributing to his cough as well. We will check CXR today to rule out infectious/inflammatory process or pulmonary edema.

## 2022-02-01 NOTE — Progress Notes (Signed)
$'@Patient'f$  ID: Edwin [name], male    DOB: September 01, 1955, 66 y.o.   MRN: [id number]  Chief Complaint  Patient presents with   Follow-up    Patient here to go over PFT results. Patient has had coughing    Referring provider: Pa, [facility]  HPI: 66 year old male, active smoker followed for chronic bronchitis.  He is a patient of Dr. Juanetta Gosling and last seen in office 11/29/2021.  Past medical history significant for anxiety, prostate cancer, CKD stage III, depression, GERD, C. difficile in 2016, HLD, schizophrenia.  TEST/EVENTS:  11/10/2021 echocardiogram: EF 50 to 85%.  Normal PASP.  No valvular abnormalities 11/15/2021 CTA chest: There are bilateral pleural effusions with adjacent bibasilar atelectasis, left greater than right.  No suspicious pulmonary nodules.  There is satisfactory opacification of the pulmonary arteries with no evidence of PE. 01/22/2022 PFTs: FVC 48, FEV1 51, ratio 85, TLC 55, DLCOcor 65%.  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  11/29/2021: OV with Dr. Halford Chessman for initial consult.  He was recently hospitalized due to sepsis from UTI with Klebsiella bacteremia.  He also was treated for acute respiratory failure related to pulmonary edema and atelectasis and possible pulmonary embolism.  PE was subsequently ruled out with CTA.  There was some concern that he may have obstructive lung disease given his smoking history so outpatient pulmonary visit was scheduled.  He has cut back from 2 packs/day to 1 pack/day; trying to gradually quit.  Going to try nicotine replacement to help.  He reports that his cough is productive with clear to yellow sputum.  He always has a hoarse voice as well.  Scheduled for pulmonary function testing.  Prescribed as needed Combivent in interim.  02/01/2022: Today - follow up Patient presents today with his girlfriend for follow up after pulmonary function testing, which revealed a restrictive defect with decreased diffusion capacity. No  obstruction or bronchodilator response. She helps to provide his history. He reports his breathing has been stable since we saw him last. He doesn't notice that he gets winded or has any trouble with his breathing. His main complaint is his chronic cough, which is occasionally productive with clear to yellow phlegm but otherwise dry, and his leg swelling. He has been having trouble with his legs since being discharged and has addressed it with his PCP who put him on a fluid pill. They are not sure the name of the medication or if he's taking it consistently. His girlfriend occasionally hears him wheeze but seems to resolve on it's own. He denies any hemoptysis, fevers, night sweats, anorexia, weight loss. He has used Combivent a few times but hasn't noticed much difference with it.   No Known Allergies  Immunization History  Administered Date(s) Administered   Tdap 01/13/2015    Past Medical History:  Diagnosis Date   Anxiety    Cancer ([place])    prostate   CKD (chronic kidney disease) stage 3, GFR 30-59 ml/min (HCC)    Depression    GERD (gastroesophageal reflux disease)    under control   History of Clostridium difficile colitis    03-31-2015--  resolved   History of sepsis    secondary to UTI and ARF  x2 admission 03-31-2015 &  04-14-2015   Hyperlipidemia    Paranoid schizophrenia ([place])    Urinary incontinence     Tobacco History: Social History   Tobacco Use  Smoking Status Every Day   Packs/day: 2.00   Years: 40.00  Total pack years: 80.00   Types: Cigarettes  Smokeless Tobacco Never   Ready to quit: Not Answered Counseling given: Not Answered   Outpatient Medications Prior to Visit  Medication Sig Dispense Refill   Ascorbic Acid (VITAMIN C) 1000 MG tablet Take 1,000 mg by mouth daily.     aspirin EC 81 MG tablet Take 81 mg by mouth daily.     benztropine (COGENTIN) 0.5 MG tablet Take 0.5 mg by mouth every morning.  4   bethanechol (URECHOLINE) 5 MG tablet TAKE 1  TABLET BY MOUTH TWICE A DAY (Patient taking differently: Take 5 mg by mouth in the morning and at bedtime.) 60 tablet 11   Cholecalciferol (VITAMIN D) 50 MCG (2000 UT) CAPS Take 2,000 Units by mouth daily.     cloZAPine (CLOZARIL) 100 MG tablet Take 400 mg by mouth at bedtime.     finasteride (PROSCAR) 5 MG tablet TAKE 1 TABLET BY MOUTH EVERY DAY (Patient taking differently: Take 5 mg by mouth daily.) 90 tablet 3   Ipratropium-Albuterol (COMBIVENT RESPIMAT) 20-100 MCG/ACT AERS respimat Inhale 1 puff into the lungs every 6 (six) hours as needed for wheezing or shortness of breath. 1 each 5   Multiple Vitamin (MULTIVITAMIN WITH MINERALS) TABS tablet Take 1 tablet by mouth daily.     polyethylene glycol (MIRALAX / GLYCOLAX) 17 g packet Take 17 g by mouth daily as needed for moderate constipation. 14 each 0   Potassium 99 MG TABS Take 99 mg by mouth daily.     tamsulosin (FLOMAX) 0.4 MG CAPS capsule Take 1 capsule (0.4 mg total) by mouth daily. 30 capsule 2   temazepam (RESTORIL) 15 MG capsule Take 15 mg by mouth at bedtime.  5   vitamin E 180 MG (400 UNITS) capsule Take 400 Units by mouth daily.     VRAYLAR 6 MG CAPS Take 6 mg by mouth every morning.     zinc gluconate 50 MG tablet Take 50 mg by mouth daily.     No facility-administered medications prior to visit.     Review of Systems:   Constitutional: No weight loss or gain, night sweats, fevers, chills, fatigue, or lassitude. HEENT: No headaches, difficulty swallowing, tooth/dental problems, or sore throat. No sneezing, itching, ear ache, nasal congestion, or post nasal drip CV:  No chest pain, orthopnea, PND, swelling in lower extremities, anasarca, dizziness, palpitations, syncope Resp: +chronic cough; occasional cough. No shortness of breath with exertion or at rest. No excess mucus or change in color of mucus. No hemoptysis. No chest wall deformity GI:  No heartburn, indigestion, abdominal pain, nausea, vomiting, diarrhea, change in  bowel habits, loss of appetite, bloody stools.  Skin: No rash, lesions, ulcerations MSK:  No joint pain or swelling.  No decreased range of motion.  No back pain. Neuro: No dizziness or lightheadedness.  Psych: No depression or anxiety. Mood stable.     Physical Exam:  There were no vitals taken for this visit.  GEN: Pleasant, well-appearing; obese; in no acute distress. HEENT:  Normocephalic and atraumatic. PERRLA. Sclera white. Nasal turbinates pink, moist and patent bilaterally. No rhinorrhea present. Oropharynx pink and moist, without exudate or edema. No lesions, ulcerations, or postnasal drip.  NECK:  Supple w/ fair ROM. No JVD present. Normal carotid impulses w/o bruits. Thyroid symmetrical with no goiter or nodules palpated. No lymphadenopathy.   CV: RRR, no m/r/g, +2 pitting BLE edema. Pulses intact, +2 bilaterally. No cyanosis, pallor or clubbing. PULMONARY:  Unlabored, regular breathing.  Clear bilaterally A&P w/o wheezes/rales/rhonchi. No accessory muscle use. No dullness to percussion. GI: BS present and normoactive. Protuberant abd, soft, non-tender to palpation.  MSK: No erythema, warmth or tenderness. Cap refil <2 sec all extrem. No deformities or joint swelling noted.  Neuro: A/Ox3. No focal deficits noted.   Skin: Warm, no lesions or rashe Psych: Normal affect and behavior. Judgement and thought content appropriate.     Lab Results:  CBC    Component Value Date/Time   WBC 8.9 12/12/2021 1556   RBC 5.87 (H) 12/12/2021 1556   HGB 16.2 12/12/2021 1556   HCT 49.2 12/12/2021 1556   PLT 233 12/12/2021 1556   MCV 83.8 12/12/2021 1556   MCH 27.6 12/12/2021 1556   MCHC 32.9 12/12/2021 1556   RDW 14.0 12/12/2021 1556   LYMPHSABS 0.3 (L) 11/08/2021 0400   MONOABS 0.4 11/08/2021 0400   EOSABS 0.0 11/08/2021 0400   BASOSABS 0.2 (H) 11/08/2021 0400    BMET    Component Value Date/Time   NA 138 02/01/2022 1509   K 3.5 02/01/2022 1509   CL 102 02/01/2022 1509   CO2  29 02/01/2022 1509   GLUCOSE 102 (H) 02/01/2022 1509   BUN 14 02/01/2022 1509   CREATININE 1.65 (H) 02/01/2022 1509   CREATININE 1.50 (H) 12/12/2021 1556   CALCIUM 9.4 02/01/2022 1509   GFRNONAA 56 (L) 11/16/2021 0238   GFRAA 48 (L) 08/14/2017 0504    BNP    Component Value Date/Time   BNP 396.4 (H) 11/08/2021 0904     Imaging:  DG Chest 2 View  Result Date: 02/01/2022 CLINICAL DATA:  Unresolved cough for 1 month. EXAM: CHEST - 2 VIEW COMPARISON:  Chest x-ray 11/12/2021 FINDINGS: The cardiac silhouette, mediastinal and hilar contours are within normal limits and stable. The lungs are clear of an acute process. No pleural effusions pulmonary lesions. The bony thorax is intact. IMPRESSION: No acute cardiopulmonary findings. Electronically Signed   By: Marijo Sanes M.D.   On: 02/01/2022 15:24         Latest Ref Rng & Units 01/22/2022   12:11 PM  PFT Results  FVC-Pre L 2.33   FVC-Predicted Pre % 48   FVC-Post L 1.77   FVC-Predicted Post % 37   Pre FEV1/FVC % % 78   Post FEV1/FCV % % 85   FEV1-Pre L 1.82   FEV1-Predicted Pre % 51   FEV1-Post L 1.50   DLCO uncorrected ml/min/mmHg 18.91   DLCO UNC% % 68   DLCO corrected ml/min/mmHg 18.14   DLCO COR %Predicted % 65   DLVA Predicted % 133   TLC L 3.93   TLC % Predicted % 55   RV % Predicted % 80     No results found for: "NITRICOXIDE"      Assessment & Plan:   Restrictive lung disease Severe restrictive defect with FVC 48 and TLC 55. He also has a moderate diffusion defect with DLCO 65%. Unclear underlying etiology. We will check HRCT to evaluate for underlying ILD. He does not currently have any concerns regarding his breathing; primary symptom is chronic cough but he is also an active smoker so this could be the driving factor as well.  Patient Instructions  Continue Combivent 1 puff every 6 hours as needed for shortness of breath or wheezing  Mucinex DM Twice daily for cough/congestion   Chest x ray and labs  today  High resolution CT scan ordered  Follow up after HRCT with Dr. Halford Chessman  or Katie Deshaun Schou,NP. If symptoms do not improve or worsen, please contact office for sooner follow up or seek emergency care.      Chronic cough Chronic cough. Slight increase over the past month. FeNO nl today and no obstruction on PFTs. Likely smoker's cough but concern for possible underlying ILD given recent PFTs. He also has increased BLE edema so I am concerned that this could be contributing to his cough as well. We will check CXR today to rule out infectious/inflammatory process or pulmonary edema.   Bilateral lower extremity edema Increased BLE edema over the past month. Echo from April showed low normal EF. We will check BNP and BMET today. He thought he was started on a diuretic by his PCP but they are unable to recall the name. There is not one that I see on his med list. His girlfriend is going to check when he gets home and we will discuss once we get his lab results.  Tobacco abuse He has cut back from 2 ppd to 1 ppd. He is not currently using any nicotine replacement; preferred not to start. We will continue to have ongoing discussions about cessation.    I spent 35 minutes of dedicated to the care of this patient on the date of this encounter to include pre-visit review of records, face-to-face time with the patient discussing conditions above, post visit ordering of testing, clinical documentation with the electronic health record, making appropriate referrals as documented, and communicating necessary findings to members of the patients care team.  Clayton Bibles, NP 02/01/2022  Pt aware and understands NP's role.

## 2022-02-01 NOTE — Assessment & Plan Note (Signed)
He has cut back from 2 ppd to 1 ppd. He is not currently using any nicotine replacement; preferred not to start. We will continue to have ongoing discussions about cessation.

## 2022-02-01 NOTE — Progress Notes (Signed)
Please notify patient that his chest x ray did not show any evidence of infection or fluid overload. His BNP is normal, which is a lab that can be elevated when the heart has to work too hard, so that is good news. His kidney function has steadily declined since 2 months ago, so I would recommend he follow up with his PCP to monitor this. I am going to send a short course of lasix (also known as furosemide) for him to take. 40 mg for 5 days. Please advise them to ensure that he is not already taking this as they thought his PCP had prescribed something previously. Advise him to take it in the AM as it will make him urinate more often. Stop immediately if any facial swelling develops and seek emergency care. Monitor if this helps with his leg swelling and cough. Thanks.

## 2022-02-01 NOTE — Addendum Note (Signed)
Addended by: Fran Lowes on: 02/01/2022 04:41 PM   Modules accepted: Orders

## 2022-02-01 NOTE — Progress Notes (Signed)
Reviewed and agree with assessment/plan.   Chesley Mires, MD [facility] 02/01/2022, 5:10 PM Pager:  [phone]

## 2022-02-01 NOTE — Assessment & Plan Note (Signed)
Severe restrictive defect with FVC 48 and TLC 55. He also has a moderate diffusion defect with DLCO 65%. Unclear underlying etiology. We will check HRCT to evaluate for underlying ILD. He does not currently have any concerns regarding his breathing; primary symptom is chronic cough but he is also an active smoker so this could be the driving factor as well.  Patient Instructions  Continue Combivent 1 puff every 6 hours as needed for shortness of breath or wheezing  Mucinex DM Twice daily for cough/congestion   Chest x ray and labs today  High resolution CT scan ordered  Follow up after HRCT with Dr. Halford Chessman or Alanson Aly. If symptoms do not improve or worsen, please contact office for sooner follow up or seek emergency care.

## 2022-03-12 ENCOUNTER — Ambulatory Visit (HOSPITAL_COMMUNITY)
Admission: RE | Admit: 2022-03-12 | Discharge: 2022-03-12 | Disposition: A | Discharge status: Home / Self Care | Payer: 59 | Source: Ambulatory Visit | Attending: Nurse Practitioner | Admitting: Nurse Practitioner

## 2022-03-12 ENCOUNTER — Encounter (HOSPITAL_COMMUNITY): Payer: Self-pay

## 2022-03-12 DIAGNOSIS — R918 Other nonspecific abnormal finding of lung field: Secondary | ICD-10-CM | POA: Insufficient documentation

## 2022-03-12 DIAGNOSIS — I700 Atherosclerosis of aorta: Secondary | ICD-10-CM | POA: Insufficient documentation

## 2022-03-12 DIAGNOSIS — J849 Interstitial pulmonary disease, unspecified: Secondary | ICD-10-CM | POA: Diagnosis not present

## 2022-03-12 DIAGNOSIS — J984 Other disorders of lung: Secondary | ICD-10-CM | POA: Insufficient documentation

## 2022-03-21 ENCOUNTER — Encounter: Payer: Self-pay | Admitting: Pulmonary Disease

## 2022-03-21 ENCOUNTER — Ambulatory Visit (INDEPENDENT_AMBULATORY_CARE_PROVIDER_SITE_OTHER): Payer: 59 | Admitting: Pulmonary Disease

## 2022-03-21 VITALS — BP 110/80 | HR 92 | Ht 71.0 in | Wt 229.2 lb

## 2022-03-21 DIAGNOSIS — J411 Mucopurulent chronic bronchitis: Secondary | ICD-10-CM

## 2022-03-21 DIAGNOSIS — Z720 Tobacco use: Secondary | ICD-10-CM | POA: Diagnosis not present

## 2022-03-21 DIAGNOSIS — J984 Other disorders of lung: Secondary | ICD-10-CM

## 2022-03-21 NOTE — Progress Notes (Signed)
[facility]  Chief Complaint  Patient presents with   Follow-up    Follow-up: HRCT scan    Past Surgical History:  He  has a past surgical history that includes Cystoscopy (N/A, 03/08/2015); Transurethral resection of bladder tumor (N/A, 03/08/2015); Prostate biopsy (N/A, 03/08/2015); Cervical disc surgery (1990's); Cystoscopy (06/10/2015); transthoracic echocardiogram (06-11-2015); Transurethral resection of prostate (N/A, 06/27/2015); and Transurethral resection of prostate (N/A, 07/09/2016).  Past Medical History:  Anxiety, Prostate cancer, CKD 3, Depression, GERD, C diff 2016, HLD, Schizophrenia  Constitutional:  BP 110/80 (BP Location: Left Arm)   Pulse 92   Ht '5\' 11"'$  (1.803 m)   Wt 229 lb 3.2 oz (104 kg)   SpO2 96%   BMI 31.97 kg/m   Brief Summary:  Edwin [name] is a 66 y.o. male smoker with chronic bronchitis.      Subjective:   PFT showed restrictive defect and diffusion defect.  CT chest showed scarring and patchy reticulation.  He continues to smoke 1 ppd.  He didn't feel like combivent helped and hasn't been using.  He gets cough with clear to yellow sputum sometimes.  No wheeze.  Doesn't feel like his breathing limits his activity.  Physical Exam:   Appearance - well kempt   ENMT - no sinus tenderness, no oral exudate, no LAN, Mallampati 3 airway, no stridor, decreased voice quality  Respiratory - equal breath sounds bilaterally, no wheezing or rales  CV - s1s2 regular rate and rhythm, no murmurs  Ext - no clubbing, no edema  Skin - no rashes  Psych - normal mood and affect    Pulmonary testing:  PFT 01/22/22 >> FEV1 1.82 (51%), FEV1% 78, TLC 3.93 (55%), DLCO 68%  Chest Imaging:  V/Q scan 11/11/21 >> focal perfusion defect superior segment of LLL CT angio chest 11/15/21 >> no PE, small b/l effusions with ATX HRCT chest 03/15/22 >> Thick parenchymal band in the anteromedial basilar right upper lobe is new. Small  mildly thickened parenchymal band in the posterior lingula is decreased. Mild patchy subpleural reticulation and ground-glass opacity in the dependent lower lobes.  Cardiac Tests:  Echo 11/10/21 >> EF 50 to 55%  Social History:  He  reports that he has been smoking cigarettes. He has a 80.00 pack-year smoking history. He has never used smokeless tobacco. He reports that he does not drink alcohol and does not use drugs.  Family History:  His family history includes Breast cancer in his mother.     Assessment/Plan:   Chronic bronchitis. - minimal symptoms at present - he opted to defer additional inhaler therapy at this time  Tobacco abuse. - he will try nicotine replacement   Restrictive lung disease. - CT chest shows scarring likely related to prior respiratory infection - will monitor clinically for now - if symptoms progress, then would need repeat chest imaging  Time Spent Involved in Patient Care on Day of Examination:  27 minutes  Follow up:   Patient Instructions  Follow up in 1 year  Medication List:   Allergies as of 03/21/2022   No Known Allergies      Medication List        Accurate as of March 21, 2022  2:42 PM. If you have any questions, ask your nurse or doctor.          aspirin EC 81 MG tablet Take 81 mg by mouth daily.   benztropine 0.5 MG tablet Commonly known as: COGENTIN Take  0.5 mg by mouth every morning.   bethanechol 5 MG tablet Commonly known as: URECHOLINE TAKE 1 TABLET BY MOUTH TWICE A DAY What changed: when to take this   cloZAPine 100 MG tablet Commonly known as: CLOZARIL Take 400 mg by mouth at bedtime.   Combivent Respimat 20-100 MCG/ACT Aers respimat Generic drug: Ipratropium-Albuterol Inhale 1 puff into the lungs every 6 (six) hours as needed for wheezing or shortness of breath.   finasteride 5 MG tablet Commonly known as: PROSCAR TAKE 1 TABLET BY MOUTH EVERY DAY   multivitamin with minerals Tabs tablet Take 1  tablet by mouth daily.   polyethylene glycol 17 g packet Commonly known as: MIRALAX / GLYCOLAX Take 17 g by mouth daily as needed for moderate constipation.   Potassium 99 MG Tabs Take 99 mg by mouth daily.   tamsulosin 0.4 MG Caps capsule Commonly known as: FLOMAX Take 1 capsule (0.4 mg total) by mouth daily.   temazepam 15 MG capsule Commonly known as: RESTORIL Take 15 mg by mouth at bedtime.   vitamin C 1000 MG tablet Take 1,000 mg by mouth daily.   Vitamin D 50 MCG (2000 UT) Caps Take 2,000 Units by mouth daily.   vitamin E 180 MG (400 UNITS) capsule Take 400 Units by mouth daily.   Vraylar 6 MG Caps Generic drug: Cariprazine HCl Take 6 mg by mouth every morning.   zinc gluconate 50 MG tablet Take 50 mg by mouth daily.        Signature:  Chesley Mires, MD [facility] Pager - [phone] 03/21/2022, 2:42 PM

## 2022-03-21 NOTE — Patient Instructions (Signed)
Follow up in 1 year.

## 2022-05-18 ENCOUNTER — Other Ambulatory Visit: Payer: Self-pay | Admitting: Urology

## 2022-06-02 ENCOUNTER — Other Ambulatory Visit: Payer: Self-pay | Admitting: Pulmonary Disease

## 2022-06-03 ENCOUNTER — Other Ambulatory Visit: Payer: Self-pay

## 2022-06-03 ENCOUNTER — Emergency Department (HOSPITAL_COMMUNITY): Payer: 59

## 2022-06-03 ENCOUNTER — Encounter (HOSPITAL_COMMUNITY): Payer: Self-pay | Admitting: Emergency Medicine

## 2022-06-03 ENCOUNTER — Emergency Department (HOSPITAL_COMMUNITY)
Admission: EM | Admit: 2022-06-03 | Discharge: 2022-06-03 | Disposition: A | Discharge status: Home / Self Care | Payer: 59 | Attending: Emergency Medicine | Admitting: Emergency Medicine

## 2022-06-03 DIAGNOSIS — F17200 Nicotine dependence, unspecified, uncomplicated: Secondary | ICD-10-CM | POA: Diagnosis not present

## 2022-06-03 DIAGNOSIS — N183 Chronic kidney disease, stage 3 unspecified: Secondary | ICD-10-CM | POA: Diagnosis not present

## 2022-06-03 DIAGNOSIS — W268XXA Contact with other sharp object(s), not elsewhere classified, initial encounter: Secondary | ICD-10-CM | POA: Diagnosis not present

## 2022-06-03 DIAGNOSIS — S91311A Laceration without foreign body, right foot, initial encounter: Secondary | ICD-10-CM | POA: Diagnosis not present

## 2022-06-03 DIAGNOSIS — S99921A Unspecified injury of right foot, initial encounter: Secondary | ICD-10-CM | POA: Diagnosis present

## 2022-06-03 DIAGNOSIS — Z8546 Personal history of malignant neoplasm of prostate: Secondary | ICD-10-CM | POA: Diagnosis not present

## 2022-06-03 DIAGNOSIS — Z7982 Long term (current) use of aspirin: Secondary | ICD-10-CM | POA: Diagnosis not present

## 2022-06-03 DIAGNOSIS — R0602 Shortness of breath: Secondary | ICD-10-CM | POA: Insufficient documentation

## 2022-06-03 DIAGNOSIS — R5383 Other fatigue: Secondary | ICD-10-CM | POA: Insufficient documentation

## 2022-06-03 LAB — CBC
HCT: 52.4 % — ABNORMAL HIGH (ref 39.0–52.0)
Hemoglobin: 16.6 g/dL (ref 13.0–17.0)
MCH: 27.1 pg (ref 26.0–34.0)
MCHC: 31.7 g/dL (ref 30.0–36.0)
MCV: 85.6 fL (ref 80.0–100.0)
Platelets: 305 10*3/uL (ref 150–400)
RBC: 6.12 MIL/uL — ABNORMAL HIGH (ref 4.22–5.81)
RDW: 15.7 % — ABNORMAL HIGH (ref 11.5–15.5)
WBC: 11.2 10*3/uL — ABNORMAL HIGH (ref 4.0–10.5)
nRBC: 0 % (ref 0.0–0.2)

## 2022-06-03 LAB — BRAIN NATRIURETIC PEPTIDE: B Natriuretic Peptide: 73.8 pg/mL (ref 0.0–100.0)

## 2022-06-03 LAB — TROPONIN I (HIGH SENSITIVITY): Troponin I (High Sensitivity): 6 ng/L (ref <18)

## 2022-06-03 LAB — COMPREHENSIVE METABOLIC PANEL
ALT: 32 U/L (ref 0–44)
AST: 23 U/L (ref 15–41)
Albumin: 4.3 g/dL (ref 3.5–5.0)
Alkaline Phosphatase: 81 U/L (ref 38–126)
Anion gap: 11 (ref 5–15)
BUN: 21 mg/dL (ref 8–23)
CO2: 26 mmol/L (ref 22–32)
Calcium: 9.2 mg/dL (ref 8.9–10.3)
Chloride: 103 mmol/L (ref 98–111)
Creatinine, Ser: 1.67 mg/dL — ABNORMAL HIGH (ref 0.61–1.24)
GFR, Estimated: 46 mL/min — ABNORMAL LOW (ref >60)
Glucose, Bld: 128 mg/dL — ABNORMAL HIGH (ref 70–99)
Potassium: 3.6 mmol/L (ref 3.5–5.1)
Sodium: 140 mmol/L (ref 135–145)
Total Bilirubin: 0.5 mg/dL (ref 0.3–1.2)
Total Protein: 7.5 g/dL (ref 6.5–8.1)

## 2022-06-03 NOTE — ED Triage Notes (Signed)
Pt in via [facility] with 1in lac to R dorsal foot, happened while stepping out of shower and hitting foot on glass shower door. Pt also with another small lac to same foot near heel. EMS states heavy loss of blood on scene, saturated full towel. Wrapped with gauze on ED arrival, controlled bleeding

## 2022-06-03 NOTE — ED Provider Notes (Signed)
[facility] DEPT Provider Note   CSN: [id number] Arrival date & time: 06/03/22  6144     History  Chief Complaint  Patient presents with   Foot Pain   Fatigue   Shortness of Breath    LATIF NAZARENO is a 66 y.o. male.   Foot Pain Associated symptoms include shortness of breath.  Shortness of Breath  Jacarri Gesner is a 61y male with a history of prostate cancer, restrictive lung disease, CKD3, indwelling foley, tobacco use here today for a cut on the right ankle that occurred about 14 hours ago. He cut his foot on the bottom of a shower door, he said it continued to bleed until he came to the ER this morning and a dressing was applied. He denies blood thinners, he is unsure of his last tetanus shot. He does admit to shortness of breath for "months" denies chest pain or palpitations, cough, recent illness, NVD.  No lightheadedness or dizziness. Denies any fevers.  He states that he came to the ER because his foot was bleeding.  He states he did not come for any other symptoms however he does feel that he has been somewhat chronically fatigued.  Has been short of breath for months and has had less energy.     Home Medications Prior to Admission medications   Medication Sig Start Date End Date Taking? Authorizing Provider  doxycycline (VIBRAMYCIN) 100 MG capsule Take 100 mg by mouth 2 (two) times daily. 06/01/22  Yes [provider]  furosemide (LASIX) 20 MG tablet Take 20 mg by mouth daily as needed. 05/11/22  Yes [provider]  Ascorbic Acid (VITAMIN C) 1000 MG tablet Take 1,000 mg by mouth daily.    [provider]  aspirin EC 81 MG tablet Take 81 mg by mouth daily.    [provider]  benztropine (COGENTIN) 0.5 MG tablet Take 0.5 mg by mouth every morning. 12/13/14   [provider]  bethanechol (URECHOLINE) 5 MG tablet TAKE 1 TABLET BY MOUTH TWICE A DAY Patient taking differently: Take 5 mg by mouth in  the morning and at bedtime. 07/04/21   McKenzie, Candee Furbish, MD  Cholecalciferol (VITAMIN D) 50 MCG (2000 UT) CAPS Take 2,000 Units by mouth daily.    [provider]  cloZAPine (CLOZARIL) 100 MG tablet Take 400 mg by mouth at bedtime. 11/03/21   [provider]  finasteride (PROSCAR) 5 MG tablet TAKE 1 TABLET BY MOUTH EVERY DAY Patient taking differently: Take 5 mg by mouth daily. 05/17/20   McKenzie, Candee Furbish, MD  Ipratropium-Albuterol (COMBIVENT RESPIMAT) 20-100 MCG/ACT AERS respimat Inhale 1 puff into the lungs every 6 (six) hours as needed for wheezing or shortness of breath. Patient not taking: Reported on 03/21/2022 11/29/21   Chesley Mires, MD  Multiple Vitamin (MULTIVITAMIN WITH MINERALS) TABS tablet Take 1 tablet by mouth daily.    [provider]  polyethylene glycol (MIRALAX / GLYCOLAX) 17 g packet Take 17 g by mouth daily as needed for moderate constipation. 11/16/21   [name], MD  Potassium 99 MG TABS Take 99 mg by mouth daily.    [provider]  tamsulosin (FLOMAX) 0.4 MG CAPS capsule TAKE 1 CAPSULE BY MOUTH TWICE A DAY 05/24/22   McKenzie, Candee Furbish, MD  temazepam (RESTORIL) 15 MG capsule Take 15 mg by mouth at bedtime. 06/14/16   [provider]  vitamin E 180 MG (400 UNITS) capsule Take 400 Units by mouth  daily.    [provider]  VRAYLAR 6 MG CAPS Take 6 mg by mouth every morning. 10/24/21   [provider]  zinc gluconate 50 MG tablet Take 50 mg by mouth daily.    [provider]      Allergies    Patient has no known allergies.    Review of Systems   Review of Systems  Respiratory:  Positive for shortness of breath.     Physical Exam Updated Vital Signs BP 110/78 (BP Location: Left Arm)   Pulse 95   Temp 98.1 F (36.7 C) (Oral)   Resp 18   Wt 104 kg   SpO2 94%   BMI 31.98 kg/m  Physical Exam Vitals and nursing note reviewed.  Constitutional:      General: He is not in acute distress.     Comments: Chronically ill-appearing 67 year old male in no acute distress.  HENT:     Head: Normocephalic and atraumatic.     Nose: Nose normal.     Mouth/Throat:     Mouth: Mucous membranes are moist.  Eyes:     General: No scleral icterus. Cardiovascular:     Rate and Rhythm: Normal rate and regular rhythm.     Pulses: Normal pulses.     Heart sounds: Normal heart sounds.  Pulmonary:     Effort: Pulmonary effort is normal. No respiratory distress.     Breath sounds: No wheezing.     Comments: Faint crackles in bilateral bases Speaking in full sentences w very soft voice  Abdominal:     Palpations: Abdomen is soft.     Tenderness: There is no abdominal tenderness. There is no guarding or rebound.  Musculoskeletal:     Cervical back: Normal range of motion.     Right lower leg: Edema present.     Left lower leg: Edema present.     Comments: Trace bilateral lower extremity edema.  No asymmetry.  Skin:    General: Skin is warm and dry.     Capillary Refill: Capillary refill takes less than 2 seconds.  Neurological:     Mental Status: He is alert. Mental status is at baseline.  Psychiatric:        Mood and Affect: Mood normal.        Behavior: Behavior normal.     ED Results / Procedures / Treatments   Labs (all labs ordered are listed, but only abnormal results are displayed) Labs Reviewed  COMPREHENSIVE METABOLIC PANEL  BRAIN NATRIURETIC PEPTIDE  CBC  TROPONIN I (HIGH SENSITIVITY)    EKG None  Radiology DG Chest 2 View  Result Date: 06/03/2022 CLINICAL DATA:  Shortness of breath. EXAM: CHEST - 2 VIEW COMPARISON:  02/01/2022 FINDINGS: Low volume film. Streaky opacity at the lung bases suggest atelectasis. No focal consolidation, pulmonary edema, or pleural effusion. Cardiopericardial silhouette is at upper limits of normal for size. The visualized bony structures of the thorax are unremarkable. IMPRESSION: Low volume film with bibasilar atelectasis. Electronically  Signed   By: Misty Stanley M.D.   On: 06/03/2022 11:11   DG Foot Complete Right  Result Date: 06/03/2022 CLINICAL DATA:  Right foot laceration from glass EXAM: RIGHT FOOT COMPLETE - 3+ VIEW COMPARISON:  None Available. FINDINGS: There is no evidence of fracture or dislocation. No opaque foreign body IMPRESSION: Negative for fracture or opaque foreign body. Electronically Signed   By: Jorje Guild M.D.   On: 06/03/2022 07:41    Procedures Procedures  Medications Ordered in ED Medications - No data to display  ED Course/ Medical Decision Making/ A&P                           Medical Decision Making Amount and/or Complexity of Data Reviewed Labs: ordered. Radiology: ordered.  This patient presents to the ED for concern of right foot cut, chronic fatigue., this involves a number of treatment options, and is a complaint that carries with it a moderate risk of complications and morbidity. A differential diagnosis was considered for the patient's symptoms which is discussed below:   Foot laceration is nearly invisible.  On my exam I was only able to find the area that he had cut himself previously by vigorously scrubbing the skin which caused scant bleeding from a small wound on the right medial ankle that is not visible after I held direct pressure for a moment which stopped bleeding.  Patient is somewhat vague with his presentation.  It seems that he is here primarily for his wounds to his right foot however did endorse some fatigue.  His oxygen levels were somewhat low here in the ER at 91%.  I did obtain chest x-ray, labs and will reassess.  Co morbidities: Discussed in HPI   Brief History:  Zyden Suman is a 55y male with a history of prostate cancer, restrictive lung disease, CKD3, indwelling foley, tobacco use here today for a cut on the right ankle that occurred about 14 hours ago. He cut his foot on the bottom of a shower door, he said it continued to bleed until he came to  the ER this morning and a dressing was applied. He denies blood thinners, he is unsure of his last tetanus shot. He does admit to shortness of breath for "months" denies chest pain or palpitations, cough, recent illness, NVD.  No lightheadedness or dizziness. Denies any fevers.  He states that he came to the ER because his foot was bleeding.  He states he did not come for any other symptoms however he does feel that he has been somewhat chronically fatigued.  Has been short of breath for months and has had less energy.    EMR reviewed including pt PMHx, past surgical history and past visits to ER.   See HPI for more details   Lab Tests:   I ordered and independently interpreted labs. Labs notable for CMP unremarkable.  No change from baseline.  Troponin x1 within normal meds.  Imaging Studies:  NAD. I personally reviewed all imaging studies and no acute abnormality found. I agree with radiology interpretation.    Cardiac Monitoring:  NA EKG non-ischemic   Medicines ordered:     Critical Interventions:     Consults/Attending Physician      Reevaluation:  After the interventions noted above I re-evaluated patient and found that they have :stayed the same   Social Determinants of Health:      Problem List / ED Course:  Foot wound. Difficult to see on exam. There is some dried blood and there was a small amount of bleeding with vigorous scrubbing.  Fatigue/shortness of breath.  Seems to be patient's baseline.  No significant changes today.  SPO2 between 93 and 95% on room air during my evaluation.  Heart rate less than 95.  Patient left prior to discharge however will not mark patient as AMA given that he would have been discharged after review of labs (which resulted shortly after).  Dispostion:  After consideration of the diagnostic results and the patients response to treatment, I feel that the patent would benefit from follow-up with primary care  provider.  We will also need to follow-up with pulmonology.    Final Clinical Impression(s) / ED Diagnoses Final diagnoses:  Shortness of breath  Other fatigue  Foot laceration, right, initial encounter    Rx / DC Orders ED Discharge Orders     None         Tedd Sias, [place] 06/03/22 1400    Ezequiel Essex, MD 06/03/22 1404

## 2022-06-03 NOTE — ED Notes (Signed)
Wound cleaned w/ betadine and normal saline. Non-stick dressing applied, wrapped w/ kurlex and coban. Going to podiatry Tuesday. Visitor requested to leave, advised that pt would be leaving AMA. Reports she cannot stay any longer, she cannot lose her job. Patient and visitor left.

## 2022-06-05 ENCOUNTER — Ambulatory Visit (INDEPENDENT_AMBULATORY_CARE_PROVIDER_SITE_OTHER): Payer: 59 | Admitting: Podiatry

## 2022-06-05 DIAGNOSIS — M79675 Pain in left toe(s): Secondary | ICD-10-CM | POA: Diagnosis not present

## 2022-06-05 DIAGNOSIS — M7989 Other specified soft tissue disorders: Secondary | ICD-10-CM | POA: Diagnosis not present

## 2022-06-05 DIAGNOSIS — S90511A Abrasion, right ankle, initial encounter: Secondary | ICD-10-CM

## 2022-06-05 DIAGNOSIS — M79674 Pain in right toe(s): Secondary | ICD-10-CM

## 2022-06-05 DIAGNOSIS — B351 Tinea unguium: Secondary | ICD-10-CM | POA: Diagnosis not present

## 2022-06-05 NOTE — Progress Notes (Unsigned)
Subjective:   Patient ID: Edwin [name], male   DOB: 66 y.o.   MRN: [id number]   HPI Chief Complaint  Patient presents with   Leg Problem    Leg swelling, nail trim,    Both issues have been going on for a long time. He use to wear compression socks but he stopped wearing them.   Recent BNP 73.8 Recent EF *** He thinks he is still taking the furosemide.     ROS      Objective:  Physical Exam  General: AAO x3, NAD  Dermatological: Skin is warm, dry and supple bilateral. Nails x 10 are well manicured; remaining integument appears unremarkable at this time. There are no open sores, no preulcerative lesions, no rash or signs of infection present.    Vascular: Dorsalis Pedis artery and Posterior Tibial artery pedal pulses are 2/4 bilateral with immedate capillary fill time. Pedal hair growth present. No varicosities and no lower extremity edema present bilateral. There is no pain with calf compression, swelling, warmth, erythema.   Neruologic: Grossly intact via light touch bilateral. Vibratory intact via tuning fork bilateral. Protective threshold with Semmes Wienstein monofilament intact to all pedal sites bilateral. Patellar and Achilles deep tendon reflexes 2+ bilateral. No Babinski or clonus noted bilateral.   Musculoskeletal: No gross boney pedal deformities bilateral. No pain, crepitus, or limitation noted with foot and ankle range of motion bilateral. Muscular strength 5/5 in all groups tested bilateral.  Gait: Unassisted, Nonantalgic.       Assessment:  ***     Plan:  ***

## 2022-06-30 ENCOUNTER — Other Ambulatory Visit: Payer: Self-pay | Admitting: Urology

## 2022-07-05 ENCOUNTER — Emergency Department (HOSPITAL_BASED_OUTPATIENT_CLINIC_OR_DEPARTMENT_OTHER): Payer: 59

## 2022-07-05 ENCOUNTER — Encounter (HOSPITAL_BASED_OUTPATIENT_CLINIC_OR_DEPARTMENT_OTHER): Payer: Self-pay

## 2022-07-05 ENCOUNTER — Encounter (HOSPITAL_COMMUNITY): Payer: Self-pay

## 2022-07-05 ENCOUNTER — Inpatient Hospital Stay (HOSPITAL_BASED_OUTPATIENT_CLINIC_OR_DEPARTMENT_OTHER)
Admission: EM | Admit: 2022-07-05 | Discharge: 2022-07-09 | DRG: 699 | Disposition: A | Discharge status: Home / Self Care | Payer: 59 | Attending: Internal Medicine | Admitting: Internal Medicine

## 2022-07-05 DIAGNOSIS — Z6833 Body mass index (BMI) 33.0-33.9, adult: Secondary | ICD-10-CM

## 2022-07-05 DIAGNOSIS — Y846 Urinary catheterization as the cause of abnormal reaction of the patient, or of later complication, without mention of misadventure at the time of the procedure: Secondary | ICD-10-CM | POA: Diagnosis present

## 2022-07-05 DIAGNOSIS — Z8619 Personal history of other infectious and parasitic diseases: Secondary | ICD-10-CM

## 2022-07-05 DIAGNOSIS — N390 Urinary tract infection, site not specified: Secondary | ICD-10-CM | POA: Diagnosis present

## 2022-07-05 DIAGNOSIS — A419 Sepsis, unspecified organism: Secondary | ICD-10-CM

## 2022-07-05 DIAGNOSIS — Z8546 Personal history of malignant neoplasm of prostate: Secondary | ICD-10-CM

## 2022-07-05 DIAGNOSIS — N179 Acute kidney failure, unspecified: Secondary | ICD-10-CM | POA: Diagnosis present

## 2022-07-05 DIAGNOSIS — E785 Hyperlipidemia, unspecified: Secondary | ICD-10-CM | POA: Diagnosis present

## 2022-07-05 DIAGNOSIS — J984 Other disorders of lung: Secondary | ICD-10-CM | POA: Diagnosis present

## 2022-07-05 DIAGNOSIS — E669 Obesity, unspecified: Secondary | ICD-10-CM | POA: Diagnosis present

## 2022-07-05 DIAGNOSIS — Z1152 Encounter for screening for COVID-19: Secondary | ICD-10-CM

## 2022-07-05 DIAGNOSIS — Z720 Tobacco use: Secondary | ICD-10-CM | POA: Diagnosis present

## 2022-07-05 DIAGNOSIS — F200 Paranoid schizophrenia: Secondary | ICD-10-CM | POA: Diagnosis present

## 2022-07-05 DIAGNOSIS — N1831 Chronic kidney disease, stage 3a: Secondary | ICD-10-CM | POA: Diagnosis present

## 2022-07-05 DIAGNOSIS — Z7982 Long term (current) use of aspirin: Secondary | ICD-10-CM

## 2022-07-05 DIAGNOSIS — F17210 Nicotine dependence, cigarettes, uncomplicated: Secondary | ICD-10-CM | POA: Diagnosis present

## 2022-07-05 DIAGNOSIS — Z79899 Other long term (current) drug therapy: Secondary | ICD-10-CM

## 2022-07-05 DIAGNOSIS — F17200 Nicotine dependence, unspecified, uncomplicated: Secondary | ICD-10-CM | POA: Diagnosis present

## 2022-07-05 DIAGNOSIS — R0902 Hypoxemia: Secondary | ICD-10-CM | POA: Diagnosis present

## 2022-07-05 DIAGNOSIS — E860 Dehydration: Secondary | ICD-10-CM | POA: Diagnosis present

## 2022-07-05 DIAGNOSIS — T83511A Infection and inflammatory reaction due to indwelling urethral catheter, initial encounter: Principal | ICD-10-CM | POA: Diagnosis present

## 2022-07-05 DIAGNOSIS — F209 Schizophrenia, unspecified: Secondary | ICD-10-CM | POA: Diagnosis present

## 2022-07-05 LAB — URINALYSIS, ROUTINE W REFLEX MICROSCOPIC
Bilirubin Urine: NEGATIVE
Glucose, UA: NEGATIVE mg/dL
Ketones, ur: NEGATIVE mg/dL
Nitrite: NEGATIVE
Protein, ur: 30 mg/dL — AB
Specific Gravity, Urine: <1.005 — ABNORMAL LOW (ref 1.005–1.030)
Trans Epithel, UA: 2
WBC, UA: >50 WBC/hpf — ABNORMAL HIGH (ref 0–5)
pH: 6 (ref 5.0–8.0)

## 2022-07-05 LAB — I-STAT VENOUS BLOOD GAS, ED
Acid-Base Excess: 4 mmol/L — ABNORMAL HIGH (ref 0.0–2.0)
Bicarbonate: 30.6 mmol/L — ABNORMAL HIGH (ref 20.0–28.0)
Calcium, Ion: 1.06 mmol/L — ABNORMAL LOW (ref 1.15–1.40)
HCT: 43 % (ref 39.0–52.0)
Hemoglobin: 14.6 g/dL (ref 13.0–17.0)
O2 Saturation: 39 %
Patient temperature: 101.3
Potassium: 3.2 mmol/L — ABNORMAL LOW (ref 3.5–5.1)
Sodium: 129 mmol/L — ABNORMAL LOW (ref 135–145)
TCO2: 32 mmol/L (ref 22–32)
pCO2, Ven: 56.3 mmHg (ref 44–60)
pH, Ven: 7.35 (ref 7.25–7.43)
pO2, Ven: 27 mmHg — CL (ref 32–45)

## 2022-07-05 LAB — CBC WITH DIFFERENTIAL/PLATELET
Abs Immature Granulocytes: 0.12 10*3/uL — ABNORMAL HIGH (ref 0.00–0.07)
Basophils Absolute: 0.1 10*3/uL (ref 0.0–0.1)
Basophils Relative: 0 %
Eosinophils Absolute: 0 10*3/uL (ref 0.0–0.5)
Eosinophils Relative: 0 %
HCT: 44.8 % (ref 39.0–52.0)
Hemoglobin: 15 g/dL (ref 13.0–17.0)
Immature Granulocytes: 1 %
Lymphocytes Relative: 6 %
Lymphs Abs: 1 10*3/uL (ref 0.7–4.0)
MCH: 26.9 pg (ref 26.0–34.0)
MCHC: 33.5 g/dL (ref 30.0–36.0)
MCV: 80.3 fL (ref 80.0–100.0)
Monocytes Absolute: 1.6 10*3/uL — ABNORMAL HIGH (ref 0.1–1.0)
Monocytes Relative: 10 %
Neutro Abs: 14.1 10*3/uL — ABNORMAL HIGH (ref 1.7–7.7)
Neutrophils Relative %: 83 %
Platelets: 245 10*3/uL (ref 150–400)
RBC: 5.58 MIL/uL (ref 4.22–5.81)
RDW: 14.5 % (ref 11.5–15.5)
WBC: 16.9 10*3/uL — ABNORMAL HIGH (ref 4.0–10.5)
nRBC: 0 % (ref 0.0–0.2)

## 2022-07-05 LAB — COMPREHENSIVE METABOLIC PANEL
ALT: 22 U/L (ref 0–44)
AST: 17 U/L (ref 15–41)
Albumin: 4.1 g/dL (ref 3.5–5.0)
Alkaline Phosphatase: 100 U/L (ref 38–126)
Anion gap: 16 — ABNORMAL HIGH (ref 5–15)
BUN: 25 mg/dL — ABNORMAL HIGH (ref 8–23)
CO2: 24 mmol/L (ref 22–32)
Calcium: 8.7 mg/dL — ABNORMAL LOW (ref 8.9–10.3)
Chloride: 88 mmol/L — ABNORMAL LOW (ref 98–111)
Creatinine, Ser: 2.66 mg/dL — ABNORMAL HIGH (ref 0.61–1.24)
GFR, Estimated: 26 mL/min — ABNORMAL LOW (ref >60)
Glucose, Bld: 158 mg/dL — ABNORMAL HIGH (ref 70–99)
Potassium: 2.9 mmol/L — ABNORMAL LOW (ref 3.5–5.1)
Sodium: 128 mmol/L — ABNORMAL LOW (ref 135–145)
Total Bilirubin: 0.5 mg/dL (ref 0.3–1.2)
Total Protein: 7.7 g/dL (ref 6.5–8.1)

## 2022-07-05 LAB — PROTIME-INR
INR: 1.2 (ref 0.8–1.2)
Prothrombin Time: 14.6 seconds (ref 11.4–15.2)

## 2022-07-05 LAB — RESP PANEL BY RT-PCR (FLU A&B, COVID) ARPGX2
Influenza A by PCR: NEGATIVE
Influenza B by PCR: NEGATIVE
SARS Coronavirus 2 by RT PCR: NEGATIVE

## 2022-07-05 LAB — LACTIC ACID, PLASMA: Lactic Acid, Venous: 0.9 mmol/L (ref 0.5–1.9)

## 2022-07-05 LAB — AMMONIA: Ammonia: 45 umol/L — ABNORMAL HIGH (ref 9–35)

## 2022-07-05 LAB — MAGNESIUM: Magnesium: 1.9 mg/dL (ref 1.7–2.4)

## 2022-07-05 LAB — APTT: aPTT: 34 seconds (ref 24–36)

## 2022-07-05 LAB — BRAIN NATRIURETIC PEPTIDE: B Natriuretic Peptide: 56.7 pg/mL (ref 0.0–100.0)

## 2022-07-05 MED ORDER — SODIUM CHLORIDE 0.9 % IV SOLN
2.0000 g | Freq: Once | INTRAVENOUS | Status: AC
  Administered 2022-07-05: 2 g via INTRAVENOUS
  Filled 2022-07-05: qty 12.5

## 2022-07-05 MED ORDER — SODIUM CHLORIDE 0.9 % IV SOLN
2.0000 g | Freq: Two times a day (BID) | INTRAVENOUS | Status: DC
  Administered 2022-07-06 – 2022-07-07 (×3): 2 g via INTRAVENOUS
  Filled 2022-07-05 (×4): qty 12.5

## 2022-07-05 MED ORDER — SODIUM CHLORIDE 0.9 % IV BOLUS
1000.0000 mL | Freq: Once | INTRAVENOUS | Status: AC
  Administered 2022-07-05: 1000 mL via INTRAVENOUS

## 2022-07-05 MED ORDER — LACTATED RINGERS IV BOLUS
1000.0000 mL | Freq: Once | INTRAVENOUS | Status: AC
  Administered 2022-07-05: 1000 mL via INTRAVENOUS

## 2022-07-05 MED ORDER — VANCOMYCIN HCL IN DEXTROSE 1-5 GM/200ML-% IV SOLN: 1000.0000 mg | Freq: Once | INTRAVENOUS | Status: DC

## 2022-07-05 MED ORDER — LACTATED RINGERS IV SOLN: INTRAVENOUS | Status: DC

## 2022-07-05 MED ORDER — VANCOMYCIN HCL IN DEXTROSE 1-5 GM/200ML-% IV SOLN
1000.0000 mg | Freq: Once | INTRAVENOUS | Status: AC
  Administered 2022-07-05: 1000 mg via INTRAVENOUS
  Filled 2022-07-05: qty 200

## 2022-07-05 MED ORDER — VANCOMYCIN HCL IN DEXTROSE 1-5 GM/200ML-% IV SOLN
1000.0000 mg | Freq: Once | INTRAVENOUS | Status: AC
  Administered 2022-07-06: 1000 mg via INTRAVENOUS
  Filled 2022-07-05: qty 200

## 2022-07-05 MED ORDER — VANCOMYCIN VARIABLE DOSE PER UNSTABLE RENAL FUNCTION (PHARMACIST DOSING)
Status: DC
  Filled 2022-07-05: qty 1

## 2022-07-05 MED ORDER — POTASSIUM CHLORIDE 10 MEQ/100ML IV SOLN
10.0000 meq | Freq: Once | INTRAVENOUS | Status: AC
  Administered 2022-07-05: 10 meq via INTRAVENOUS
  Filled 2022-07-05: qty 100

## 2022-07-05 MED ORDER — METRONIDAZOLE 500 MG/100ML IV SOLN
500.0000 mg | Freq: Once | INTRAVENOUS | Status: AC
  Administered 2022-07-05: 500 mg via INTRAVENOUS
  Filled 2022-07-05: qty 100

## 2022-07-05 NOTE — ED Provider Notes (Addendum)
[facility] EMERGENCY DEPT Provider Note   CSN: [id number] Arrival date & time: 07/05/22  1821     History  Chief Complaint  Patient presents with   Fever   Shortness of Breath    Edwin [name] is a 66 y.o. male history includes prostate cancer, chronic indwelling Foley, CKD, GERD, hyperlipidemia, schizophrenia, smoker, restrictive lung disease.  Patient presents to this ER for concern of sepsis.  Patient reports that he was taken to an urgent care by his brother for shortness of breath and fatigue x 1 day.  Patient reports that he overall feels well but was told he had to come here by the urgent care.  Patient reports mild fatigue over the past day no clear inciting event.  Patient is accompanied by his brother today who provides supplemental history.  Patient brother reports that he went to come check on the patient today and noticed that he was having some shortness of breath he reports the shortness of breath just started over the past 1-2 days.  Denies sick contacts.  He reports that the patient has not been complaining of any pain not had any vomiting, diarrhea or any additional concerns.  Patient denies chest pain, cough/hemoptysis, abdominal pain, vomiting, diarrhea, extremity pain or any additional concerns.  HPI     Home Medications Prior to Admission medications   Medication Sig Start Date End Date Taking? Authorizing Provider  Ascorbic Acid (VITAMIN C) 1000 MG tablet Take 1,000 mg by mouth daily.    [provider]  aspirin EC 81 MG tablet Take 81 mg by mouth daily.    [provider]  benztropine (COGENTIN) 0.5 MG tablet Take 0.5 mg by mouth every morning. 12/13/14   [provider]  bethanechol (URECHOLINE) 5 MG tablet Take 1 tablet (5 mg total) by mouth in the morning and at bedtime. 07/02/22   McKenzie, Candee Furbish, MD  Cholecalciferol (VITAMIN D) 50 MCG (2000 UT) CAPS Take 2,000 Units by mouth daily.    [provider]   cloZAPine (CLOZARIL) 100 MG tablet Take 400 mg by mouth at bedtime. 11/03/21   [provider]  doxycycline (VIBRAMYCIN) 100 MG capsule Take 100 mg by mouth 2 (two) times daily. 06/01/22   [provider]  finasteride (PROSCAR) 5 MG tablet TAKE 1 TABLET BY MOUTH EVERY DAY Patient taking differently: Take 5 mg by mouth daily. 05/17/20   McKenzie, Candee Furbish, MD  furosemide (LASIX) 20 MG tablet Take 20 mg by mouth daily as needed. 05/11/22   [provider]  Ipratropium-Albuterol (COMBIVENT RESPIMAT) 20-100 MCG/ACT AERS respimat INHALE 1 PUFF INTO THE LUNGS EVERY 6 HOURS AS NEEDED FOR WHEEZING OR SHORTNESS OF BREATH. 06/04/22   Chesley Mires, MD  Multiple Vitamin (MULTIVITAMIN WITH MINERALS) TABS tablet Take 1 tablet by mouth daily.    [provider]  polyethylene glycol (MIRALAX / GLYCOLAX) 17 g packet Take 17 g by mouth daily as needed for moderate constipation. 11/16/21   [name], MD  Potassium 99 MG TABS Take 99 mg by mouth daily.    [provider]  tamsulosin (FLOMAX) 0.4 MG CAPS capsule TAKE 1 CAPSULE BY MOUTH TWICE A DAY 05/24/22   McKenzie, Candee Furbish, MD  temazepam (RESTORIL) 15 MG capsule Take 15 mg by mouth at bedtime. 06/14/16   [provider]  vitamin E 180 MG (400 UNITS) capsule Take 400 Units by mouth daily.    [provider]  VRAYLAR 6 MG CAPS Take  6 mg by mouth every morning. 10/24/21   [provider]  zinc gluconate 50 MG tablet Take 50 mg by mouth daily.    [provider]      Allergies    Patient has no known allergies.    Review of Systems   Review of Systems Ten systems are reviewed and are negative for acute change except as noted in the HPI  Physical Exam Updated Vital Signs BP 115/68   Pulse 83   Temp 98.7 F (37.1 C) (Oral)   Resp (!) 25   Ht '5\' 9"'$  (1.753 m)   Wt 101.6 kg   SpO2 96%   BMI 33.08 kg/m  Physical Exam Constitutional:      General: He is not in acute  distress.    Appearance: Normal appearance. He is well-developed and overweight. He is ill-appearing. He is not toxic-appearing or diaphoretic.  HENT:     Head: Normocephalic and atraumatic.  Eyes:     General: Vision grossly intact. Gaze aligned appropriately.     Extraocular Movements: Extraocular movements intact.     Conjunctiva/sclera: Conjunctivae normal.     Pupils: Pupils are equal, round, and reactive to light.  Neck:     Trachea: Trachea and phonation normal.  Cardiovascular:     Rate and Rhythm: Normal rate and regular rhythm.     Pulses: Normal pulses.          Dorsalis pedis pulses are 2+ on the right side and 2+ on the left side.  Pulmonary:     Effort: Pulmonary effort is normal. Tachypnea present. No respiratory distress.     Breath sounds: Normal air entry. Decreased breath sounds present.  Chest:     Chest wall: No deformity or tenderness.  Abdominal:     General: There is no distension.     Palpations: Abdomen is soft.     Tenderness: There is no abdominal tenderness. There is no guarding or rebound.  Musculoskeletal:        General: Normal range of motion.     Cervical back: Normal range of motion and neck supple.     Right lower leg: Edema (Trace) present.     Left lower leg: Edema (Trace) present.  Feet:     Right foot:     Protective Sensation: 3 sites tested.  3 sites sensed.     Left foot:     Protective Sensation: 3 sites tested.  3 sites sensed.     Comments: No wounds/breaks.  No erythema. Skin:    General: Skin is warm and dry.  Neurological:     Mental Status: He is alert.     GCS: GCS eye subscore is 4. GCS verbal subscore is 5. GCS motor subscore is 6.     Comments: Speech is clear and goal oriented, follows commands Major Cranial nerves without deficit, no facial droop Moves extremities without ataxia, coordination intact  Psychiatric:        Behavior: Behavior normal.    ED Results / Procedures / Treatments   Labs (all labs ordered  are listed, but only abnormal results are displayed) Labs Reviewed  CBC WITH DIFFERENTIAL/PLATELET - Abnormal; Notable for the following components:      Result Value   WBC 16.9 (*)    Neutro Abs 14.1 (*)    Monocytes Absolute 1.6 (*)    Abs Immature Granulocytes 0.12 (*)    All other components within normal limits  COMPREHENSIVE METABOLIC PANEL -  Abnormal; Notable for the following components:   Sodium 128 (*)    Potassium 2.9 (*)    Chloride 88 (*)    Glucose, Bld 158 (*)    BUN 25 (*)    Creatinine, Ser 2.66 (*)    Calcium 8.7 (*)    GFR, Estimated 26 (*)    Anion gap 16 (*)    All other components within normal limits  URINALYSIS, ROUTINE W REFLEX MICROSCOPIC - Abnormal; Notable for the following components:   APPearance HAZY (*)    Specific Gravity, Urine <1.005 (*)    Hgb urine dipstick MODERATE (*)    Protein, ur 30 (*)    Leukocytes,Ua LARGE (*)    WBC, UA >50 (*)    Bacteria, UA FEW (*)    All other components within normal limits  I-STAT VENOUS BLOOD GAS, ED - Abnormal; Notable for the following components:   pO2, Ven 27 (*)    Bicarbonate 30.6 (*)    Acid-Base Excess 4.0 (*)    Sodium 129 (*)    Potassium 3.2 (*)    Calcium, Ion 1.06 (*)    All other components within normal limits  RESP PANEL BY RT-PCR (FLU A&B, COVID) ARPGX2  CULTURE, BLOOD (ROUTINE X 2)  CULTURE, BLOOD (ROUTINE X 2)  PROTIME-INR  APTT  LACTIC ACID, PLASMA  BRAIN NATRIURETIC PEPTIDE  MAGNESIUM  LACTIC ACID, PLASMA  AMMONIA    EKG EKG Interpretation  Date/Time:  Thursday July 05 2022 20:25:44 EST Ventricular Rate:  83 PR Interval:  173 QRS Duration: 99 QT Interval:  371 QTC Calculation: 436 R Axis:   17 Text Interpretation: Sinus rhythm Confirmed by Dorie Rank [phone]) on 07/05/2022 8:30:54 PM  Radiology DG Chest Portable 1 View  Result Date: 07/05/2022 CLINICAL DATA:  Cough. EXAM: PORTABLE CHEST 1 VIEW COMPARISON:  Chest radiograph dated 06/03/2022. FINDINGS: Shallow  inspiration with bibasilar atelectasis. No focal consolidation, pleural effusion, or pneumothorax. Stable cardiac silhouette. No acute osseous pathology. IMPRESSION: No active disease. Electronically Signed   By: Anner Crete M.D.   On: 07/05/2022 20:14    Procedures .Critical Care  Performed by: Deliah Boston, PA-C Authorized by: Deliah Boston, PA-C   Critical care provider statement:    Critical care time (minutes):  35   Critical care was necessary to treat or prevent imminent or life-threatening deterioration of the following conditions:  Sepsis and renal failure   Critical care was time spent personally by me on the following activities:  Development of treatment plan with patient or surrogate, discussions with consultants, evaluation of patient's response to treatment, examination of patient, ordering and review of laboratory studies, ordering and review of radiographic studies, ordering and performing treatments and interventions, pulse oximetry, re-evaluation of patient's condition, review of old charts, obtaining history from patient or surrogate and blood draw for specimens   Care discussed with: admitting provider       Medications Ordered in ED Medications  lactated ringers infusion ( Intravenous New Bag/Given 07/05/22 2048)  vancomycin (VANCOCIN) IVPB 1000 mg/200 mL premix (has no administration in time range)    Followed by  vancomycin (VANCOCIN) IVPB 1000 mg/200 mL premix (has no administration in time range)  sodium chloride 0.9 % bolus 1,000 mL (has no administration in time range)  potassium chloride 10 mEq in 100 mL IVPB (has no administration in time range)  ceFEPIme (MAXIPIME) 2 g in sodium chloride 0.9 % 100 mL IVPB (0 g Intravenous Stopped 07/05/22 2121)  metroNIDAZOLE (FLAGYL) IVPB 500 mg (0 mg Intravenous Stopped 07/05/22 2232)  lactated ringers bolus 1,000 mL (0 mLs Intravenous Stopped 07/05/22 2233)    ED Course/ Medical Decision Making/ A&P                             Medical Decision Making 66 year old male presented with his brother today for evaluation of shortness of breath and fatigue x 1 day.  Unclear if patient had been experiencing bodyaches before.  He is noted to be febrile at urgent care prior to arrival and sent in for further treatment.  He was given 650 mg of Tylenol just prior to arrival.  On evaluation patient is in no acute distress.  He is mildly tachypneic with decreased air movement but does have a history of restrictive lung disease.  He denies any associated chest pain, cough or hemoptysis.  He has no abdominal pain vomiting or diarrhea.  His abdomen is soft and nontender on exam.  Upper and lower extremities visualized without skin changes to suggest infectious process he does have trace lower extremity edema bilaterally.  Sepsis labs and chest x-ray were ordered in triage.  Patient's heart rate and temperature within normal limits, blood pressure is stable.  Patient did not meet code sepsis criteria in triage.  Labs have began to return upon my initial evaluation patient does have a leukocytosis of 16.8.  Will order broad-spectrum antibiotics for concern of infectious process may be viral in nature at this time.  Will give IV fluids for AKI as well.  Patient's lactic is within normal limits blood pressure stable.  Amount and/or Complexity of Data Reviewed External Data Reviewed: notes.    Details: Reviewed urgent care visit from today.  Patient presented for evaluation of COVID-like symptoms with fever cough shortness of breath and fatigue x 1 day.  It appears his temperature was elevated at 101.1 F tympanic.  Heart rate was 99 bpm.  Normal respiratory rate.  COVID and influenza tests negative at that visit.  Their assessment was sepsis, fever, wheeze foot, fatigue and shortness of breath.  They referred him to the ER for concern of sepsis.  Reviewed echo from November 10, 2021, patient's most recent LVEF 50-55%.  650 mg Tylenol  given at 5:43 PM  Labs: ordered.    Details: CMP shows hyponatremia, hypokalemia and hypochloremia.  Will replete with normal saline.  BUN and creatinine elevated patient with AKI.  No emergent LFT elevations. CBC shows leukocytosis of 16.9 suggestive of infectious process.  No anemia or thrombocytopenia. Magnesium within normal limits. BNP within normal limits, low suspicion for CHF Lactic acid 0.9 which is reassuring COVID/influenza panel negative Radiology: ordered.    Details: I have personally reviewed and interpreted patient's 1 view chest x-ray, I do not appreciate any obvious PTX or PNA. ECG/medicine tests: ordered.    Details: I have personally reviewed and interpreted patient's twelve-lead EKG.  I do not appreciate any obvious acute ischemic changes.  She has normal sinus rhythm with rate of 83.  Risk Prescription drug management. Decision regarding hospitalization. Risk Details: Patient was reassessed multiple times during ER visit.  Patient is receiving broad-spectrum antibiotics for sepsis, RN has just changed patient's Foley, awaiting UA which may be source.  Patient will require admission for of further workup of infectious process and for AKI.  Patient and his brother are agreeable to care plan.  Consult called to hospitalist service spoke with Dr.Chen  who has accepted patient for admission.  Normal saline and potassium ordered. - I was asked to reevaluate patient by nursing staff as he appears more somnolent.  On arrival to room patient is sleeping in bed, SpO2 98% on room air.  He is arousable to voice answers questions appropriately but is fall back asleep quickly.  Will obtain VBG.  Patient is admitted to medicine team, care handed off given to Dr. Tomi Bamberger while patient remains in ER.  Critical Care Total time providing critical care: 35 minutes    Note: Portions of this report may have been transcribed using voice recognition software. Every effort was made to ensure  accuracy; however, inadvertent computerized transcription errors may still be present.         Final Clinical Impression(s) / ED Diagnoses Final diagnoses:  Sepsis with acute renal failure without septic shock, due to unspecified organism, unspecified acute renal failure type [facility])    Rx / DC Orders ED Discharge Orders     None         Gari Crown 07/05/22 2234    Deliah Boston, PA-C 07/05/22 2235    Dorie Rank, MD 07/06/22 1455

## 2022-07-05 NOTE — ED Triage Notes (Addendum)
Pt was seen today at [facility] today.  Dx with sepsis Temp at appt 101.1 Pt c/o fever, malaise, chills body aches & shob They checked him for covid and flu which were negative Gave him tylenol and nebulizer tx  Has indwelling catheter  Pt sent here for further evaluation

## 2022-07-05 NOTE — Progress Notes (Signed)
Pharmacy Antibiotic Note  Edwin [name] is a 66 y.o. male admitted on 07/05/2022 with Sepsis.  Pharmacy has been consulted for Vancomycin and Cefepime dosing.  Patient in AKI with CrCl 30 mL/min. Scr 2.66 with baseline 1.4-1.6  Plan: Vancomycin '2000mg'$  IV once - will not schedule regimen given significant AKI. Current renal function supports at least Q36H dosing Cefepime 2g Q12H  Monitor renal function for dose adjustments F/u cultures for de-escalation  Height: '5\' 9"'$  (175.3 cm) Weight: 101.6 kg (224 lb) IBW/kg (Calculated) : 70.7  Temp (24hrs), Avg:99.8 F (37.7 C), Min:98.2 F (36.8 C), Max:101.3 F (38.5 C)  Recent Labs  Lab 07/05/22 1901 07/05/22 1902  WBC 16.9*  --   CREATININE 2.66*  --   LATICACIDVEN  --  0.9    Estimated Creatinine Clearance: 33 mL/min (A) (by C-G formula based on SCr of 2.66 mg/dL (H)).    No Known Allergies  Antimicrobials this admission: Vancomycin 12/7 >> Cefepime 12/7 >>  Microbiology results: 12/7 Bcx: pending  Thank you for allowing pharmacy to be a part of this patient's care.  Merrilee Jansky, PharmD Clinical Pharmacist 07/05/2022 8:31 PM

## 2022-07-05 NOTE — ED Notes (Signed)
Upon this nurse entering room pt awake and alert to person and place - confused to  year.   While nurse at bedside pt observed to have difficulty maintaining arousal but is easily aroused with verbal and tactile stimulation.  Wet cough noted while this nurse at bedside.  Tachypnea RR 28.  Continuous cardiac and pulse ox mainained.  Abdomen round and soft - pt denies abdominal tenderness.  16FR Indwelling foley cath patent draining dark yellow hazy urine to leg bag.  Rectal temp obtained  - current temp 101.34F oral.

## 2022-07-05 NOTE — Plan of Care (Signed)
   Patient Name: Edwin [name], Edwin [name] DOB: 04-24-1956 MRN: [id number] Transferring facility: [facility] Requesting provider:  Athena Masse, PA Reason for transfer:  66 yo WM with 1 day of fever, chills, SOB. hx of indwelling foley, CKD stage 3a.  Today, Scr up to 2.6 from baseline 1.67.  serum potassium 2.7.  UA obtained from new foley catheter. new hypoxia. on 2 L/min supplemental O2. CXR negative for pneumonia.  requested EDP give IV KCL and IV NS. IV ABX given before UA sent. Going to: [facility] Admission Status: observation Bed Type: med tele To Do:  [facility] will assume care on arrival to accepting facility. Until arrival, care as per EDP. However, [facility] available 24/7 for questions and assistance.   Nursing staff please page [place] and Consults [phone]) as soon as the patient arrives to the hospital.  Kristopher Oppenheim, DO [facility]

## 2022-07-05 NOTE — ED Notes (Signed)
RT performed VBG on pt with the following results. MD notified of any critical values. VBG done on RA w/sats of 95%.    Latest Reference Range & Units 07/05/22 22:28  Sample type  VENOUS  pH, Ven 7.25 - 7.43  7.350  pCO2, Ven 44 - 60 mmHg 56.3  pO2, Ven 32 - 45 mmHg 27 (LL)  TCO2 22 - 32 mmol/L 32  Acid-Base Excess 0.0 - 2.0 mmol/L 4.0 (H)  Bicarbonate 20.0 - 28.0 mmol/L 30.6 (H)  O2 Saturation % 39  Patient temperature  101.3 F  (LL): Data is critically low (H): Data is abnormally high

## 2022-07-05 NOTE — ED Notes (Signed)
ED Provider at bedside.  Pt has been placed on continuous cardiac and pulse ox monitoring.  BP 101/85 (map 92) -- all other VSS.  Labs collected by triage staff -- elevated wbc 16.9

## 2022-07-06 ENCOUNTER — Encounter (HOSPITAL_COMMUNITY): Payer: Self-pay | Admitting: Internal Medicine

## 2022-07-06 ENCOUNTER — Other Ambulatory Visit: Payer: Self-pay

## 2022-07-06 ENCOUNTER — Emergency Department (HOSPITAL_BASED_OUTPATIENT_CLINIC_OR_DEPARTMENT_OTHER): Payer: 59

## 2022-07-06 DIAGNOSIS — N179 Acute kidney failure, unspecified: Secondary | ICD-10-CM

## 2022-07-06 DIAGNOSIS — E860 Dehydration: Secondary | ICD-10-CM | POA: Diagnosis not present

## 2022-07-06 DIAGNOSIS — Z79899 Other long term (current) drug therapy: Secondary | ICD-10-CM | POA: Diagnosis not present

## 2022-07-06 DIAGNOSIS — E785 Hyperlipidemia, unspecified: Secondary | ICD-10-CM | POA: Diagnosis not present

## 2022-07-06 DIAGNOSIS — N1831 Chronic kidney disease, stage 3a: Secondary | ICD-10-CM | POA: Diagnosis not present

## 2022-07-06 DIAGNOSIS — F17210 Nicotine dependence, cigarettes, uncomplicated: Secondary | ICD-10-CM | POA: Diagnosis not present

## 2022-07-06 DIAGNOSIS — E669 Obesity, unspecified: Secondary | ICD-10-CM | POA: Diagnosis not present

## 2022-07-06 DIAGNOSIS — Z8546 Personal history of malignant neoplasm of prostate: Secondary | ICD-10-CM | POA: Diagnosis not present

## 2022-07-06 DIAGNOSIS — F200 Paranoid schizophrenia: Secondary | ICD-10-CM | POA: Diagnosis not present

## 2022-07-06 DIAGNOSIS — Z6833 Body mass index (BMI) 33.0-33.9, adult: Secondary | ICD-10-CM | POA: Diagnosis not present

## 2022-07-06 DIAGNOSIS — Z7982 Long term (current) use of aspirin: Secondary | ICD-10-CM | POA: Diagnosis not present

## 2022-07-06 DIAGNOSIS — F209 Schizophrenia, unspecified: Secondary | ICD-10-CM | POA: Diagnosis present

## 2022-07-06 DIAGNOSIS — N390 Urinary tract infection, site not specified: Secondary | ICD-10-CM | POA: Diagnosis not present

## 2022-07-06 DIAGNOSIS — R0902 Hypoxemia: Secondary | ICD-10-CM | POA: Diagnosis not present

## 2022-07-06 DIAGNOSIS — Z8619 Personal history of other infectious and parasitic diseases: Secondary | ICD-10-CM | POA: Diagnosis not present

## 2022-07-06 DIAGNOSIS — T83511A Infection and inflammatory reaction due to indwelling urethral catheter, initial encounter: Secondary | ICD-10-CM | POA: Diagnosis not present

## 2022-07-06 DIAGNOSIS — Z1152 Encounter for screening for COVID-19: Secondary | ICD-10-CM | POA: Diagnosis not present

## 2022-07-06 DIAGNOSIS — Y846 Urinary catheterization as the cause of abnormal reaction of the patient, or of later complication, without mention of misadventure at the time of the procedure: Secondary | ICD-10-CM | POA: Diagnosis not present

## 2022-07-06 DIAGNOSIS — J984 Other disorders of lung: Secondary | ICD-10-CM | POA: Diagnosis not present

## 2022-07-06 LAB — BASIC METABOLIC PANEL
Anion gap: 12 (ref 5–15)
BUN: 22 mg/dL (ref 8–23)
CO2: 25 mmol/L (ref 22–32)
Calcium: 8.1 mg/dL — ABNORMAL LOW (ref 8.9–10.3)
Chloride: 100 mmol/L (ref 98–111)
Creatinine, Ser: 2.08 mg/dL — ABNORMAL HIGH (ref 0.61–1.24)
GFR, Estimated: 35 mL/min — ABNORMAL LOW (ref >60)
Glucose, Bld: 152 mg/dL — ABNORMAL HIGH (ref 70–99)
Potassium: 3.3 mmol/L — ABNORMAL LOW (ref 3.5–5.1)
Sodium: 137 mmol/L (ref 135–145)

## 2022-07-06 MED ORDER — ENOXAPARIN SODIUM 60 MG/0.6ML IJ SOSY
50.0000 mg | PREFILLED_SYRINGE | INTRAMUSCULAR | Status: DC
  Administered 2022-07-06 – 2022-07-08 (×3): 50 mg via SUBCUTANEOUS
  Filled 2022-07-06 (×3): qty 0.6

## 2022-07-06 MED ORDER — BISACODYL 5 MG PO TBEC: 5.0000 mg | DELAYED_RELEASE_TABLET | Freq: Every day | ORAL | Status: DC | PRN

## 2022-07-06 MED ORDER — IPRATROPIUM-ALBUTEROL 0.5-2.5 (3) MG/3ML IN SOLN
1.0000 mL | Freq: Four times a day (QID) | RESPIRATORY_TRACT | Status: DC
  Administered 2022-07-06 – 2022-07-07 (×3): 1 mL via RESPIRATORY_TRACT
  Filled 2022-07-06 (×3): qty 3

## 2022-07-06 MED ORDER — ASPIRIN 81 MG PO TBEC
81.0000 mg | DELAYED_RELEASE_TABLET | Freq: Every day | ORAL | Status: DC
  Administered 2022-07-07 – 2022-07-09 (×3): 81 mg via ORAL
  Filled 2022-07-06 (×3): qty 1

## 2022-07-06 MED ORDER — SODIUM CHLORIDE 0.9 % IV SOLN: INTRAVENOUS | Status: DC

## 2022-07-06 MED ORDER — CLOZAPINE 100 MG PO TABS
400.0000 mg | ORAL_TABLET | Freq: Every day | ORAL | Status: DC
  Administered 2022-07-06 – 2022-07-08 (×3): 400 mg via ORAL
  Filled 2022-07-06 (×4): qty 4

## 2022-07-06 MED ORDER — FINASTERIDE 5 MG PO TABS
5.0000 mg | ORAL_TABLET | Freq: Every day | ORAL | Status: DC
  Administered 2022-07-07 – 2022-07-09 (×3): 5 mg via ORAL
  Filled 2022-07-06 (×3): qty 1

## 2022-07-06 MED ORDER — BETHANECHOL CHLORIDE 5 MG PO TABS
5.0000 mg | ORAL_TABLET | Freq: Two times a day (BID) | ORAL | Status: DC
  Administered 2022-07-06 – 2022-07-09 (×6): 5 mg via ORAL
  Filled 2022-07-06 (×7): qty 1

## 2022-07-06 MED ORDER — ACETAMINOPHEN 325 MG PO TABS: 650.0000 mg | ORAL_TABLET | Freq: Four times a day (QID) | ORAL | Status: DC | PRN

## 2022-07-06 MED ORDER — ONDANSETRON HCL 4 MG/2ML IJ SOLN: 4.0000 mg | Freq: Four times a day (QID) | INTRAMUSCULAR | Status: DC | PRN

## 2022-07-06 MED ORDER — HYDRALAZINE HCL 20 MG/ML IJ SOLN: 5.0000 mg | INTRAMUSCULAR | Status: DC | PRN

## 2022-07-06 MED ORDER — VANCOMYCIN HCL IN DEXTROSE 1-5 GM/200ML-% IV SOLN: 1000.0000 mg | INTRAVENOUS | Status: DC

## 2022-07-06 MED ORDER — NICOTINE 14 MG/24HR TD PT24
14.0000 mg | MEDICATED_PATCH | Freq: Every day | TRANSDERMAL | Status: DC
  Administered 2022-07-07 – 2022-07-09 (×3): 14 mg via TRANSDERMAL
  Filled 2022-07-06 (×3): qty 1

## 2022-07-06 MED ORDER — DOCUSATE SODIUM 100 MG PO CAPS
100.0000 mg | ORAL_CAPSULE | Freq: Two times a day (BID) | ORAL | Status: DC
  Administered 2022-07-06 – 2022-07-09 (×6): 100 mg via ORAL
  Filled 2022-07-06 (×6): qty 1

## 2022-07-06 MED ORDER — POTASSIUM CHLORIDE CRYS ER 20 MEQ PO TBCR
40.0000 meq | EXTENDED_RELEASE_TABLET | Freq: Once | ORAL | Status: AC
  Administered 2022-07-06: 40 meq via ORAL
  Filled 2022-07-06: qty 2

## 2022-07-06 MED ORDER — ACETAMINOPHEN 650 MG RE SUPP: 650.0000 mg | Freq: Four times a day (QID) | RECTAL | Status: DC | PRN

## 2022-07-06 MED ORDER — ONDANSETRON HCL 4 MG PO TABS: 4.0000 mg | ORAL_TABLET | Freq: Four times a day (QID) | ORAL | Status: DC | PRN

## 2022-07-06 MED ORDER — ENOXAPARIN SODIUM 40 MG/0.4ML IJ SOSY: 40.0000 mg | PREFILLED_SYRINGE | INTRAMUSCULAR | Status: DC

## 2022-07-06 MED ORDER — CARIPRAZINE HCL 1.5 MG PO CAPS
6.0000 mg | ORAL_CAPSULE | Freq: Every morning | ORAL | Status: DC
  Administered 2022-07-07 – 2022-07-09 (×3): 6 mg via ORAL
  Filled 2022-07-06: qty 4
  Filled 2022-07-06: qty 1
  Filled 2022-07-06 (×2): qty 4

## 2022-07-06 MED ORDER — TAMSULOSIN HCL 0.4 MG PO CAPS
0.4000 mg | ORAL_CAPSULE | Freq: Two times a day (BID) | ORAL | Status: DC
  Administered 2022-07-06 – 2022-07-09 (×6): 0.4 mg via ORAL
  Filled 2022-07-06 (×6): qty 1

## 2022-07-06 MED ORDER — POLYETHYLENE GLYCOL 3350 17 G PO PACK: 17.0000 g | PACK | Freq: Every day | ORAL | Status: DC | PRN

## 2022-07-06 MED ORDER — IPRATROPIUM-ALBUTEROL 0.5-2.5 (3) MG/3ML IN SOLN
RESPIRATORY_TRACT | Status: AC
  Administered 2022-07-06: 3 mL via RESPIRATORY_TRACT
  Filled 2022-07-06: qty 3

## 2022-07-06 MED ORDER — ACETAMINOPHEN 325 MG PO TABS
650.0000 mg | ORAL_TABLET | Freq: Once | ORAL | Status: AC
  Administered 2022-07-06: 650 mg via ORAL
  Filled 2022-07-06: qty 2

## 2022-07-06 MED ORDER — LACTATED RINGERS IV BOLUS
1000.0000 mL | Freq: Once | INTRAVENOUS | Status: AC
  Administered 2022-07-06: 1000 mL via INTRAVENOUS

## 2022-07-06 MED ORDER — BENZTROPINE MESYLATE 0.5 MG PO TABS
0.5000 mg | ORAL_TABLET | Freq: Every morning | ORAL | Status: DC
  Administered 2022-07-07 – 2022-07-09 (×3): 0.5 mg via ORAL
  Filled 2022-07-06 (×3): qty 1

## 2022-07-06 MED ORDER — SODIUM CHLORIDE 0.9% FLUSH
3.0000 mL | Freq: Two times a day (BID) | INTRAVENOUS | Status: DC
  Administered 2022-07-07 – 2022-07-09 (×4): 3 mL via INTRAVENOUS

## 2022-07-06 MED ORDER — IPRATROPIUM-ALBUTEROL 0.5-2.5 (3) MG/3ML IN SOLN: 3.0000 mL | Freq: Once | RESPIRATORY_TRACT | Status: AC

## 2022-07-06 MED ORDER — TEMAZEPAM 7.5 MG PO CAPS
15.0000 mg | ORAL_CAPSULE | Freq: Every day | ORAL | Status: DC
  Administered 2022-07-06 – 2022-07-08 (×3): 15 mg via ORAL
  Filled 2022-07-06 (×3): qty 2

## 2022-07-06 MED ORDER — ALBUTEROL SULFATE (2.5 MG/3ML) 0.083% IN NEBU: 2.5000 mg | INHALATION_SOLUTION | RESPIRATORY_TRACT | Status: DC | PRN

## 2022-07-06 MED ORDER — OXYCODONE HCL 5 MG PO TABS: 5.0000 mg | ORAL_TABLET | ORAL | Status: DC | PRN

## 2022-07-06 NOTE — Progress Notes (Signed)
Pharmacy Antibiotic Note  [name] is a 66 y.o. male admitted on 07/05/2022 with Sepsis.  Pharmacy has been consulted for Vancomycin and Cefepime dosing.  Patient in AKI with CrCl 30 mL/min. Scr is improving and is down to 2.08.   Plan: Start vancomycin 1g IV q24H tonight Continue cefepime 2g Q12H Monitor renal function for dose adjustments F/u cultures for de-escalation  Height: '5\' 9"'$  (175.3 cm) Weight: 101.6 kg (224 lb) IBW/kg (Calculated) : 70.7  Temp (24hrs), Avg:99.1 F (37.3 C), Min:97.9 F (36.6 C), Max:101.3 F (38.5 C)  Recent Labs  Lab 07/05/22 1901 07/05/22 1902 07/06/22 0822  WBC 16.9*  --   --   CREATININE 2.66*  --  2.08*  LATICACIDVEN  --  0.9  --      Estimated Creatinine Clearance: 42.2 mL/min (A) (by C-G formula based on SCr of 2.08 mg/dL (H)).    No Known Allergies  Antimicrobials this admission: Vancomycin 12/7 >> Cefepime 12/7 >>  Microbiology results: 12/7 Bcx: pending  Thank you for allowing pharmacy to be a part of this patient's care.  Edwin [name], PharmD, BCPS, BCEMP Clinical Pharmacist Please see AMION for all pharmacy numbers 07/06/2022 11:35 AM

## 2022-07-06 NOTE — H&P (Signed)
History and Physical    Patient: Edwin [name] KGM:[id number] DOB: [birth year] DOA: 07/05/2022 DOS: the patient was seen and examined on 07/06/2022 PCP: [facility], [facility]  Patient coming from: Home - lives alone; NOK: Peng, [place], [phone]   Chief Complaint: fever  HPI: Edwin [name] is a 66 y.o. male with medical history significant of prostate CA, stage 3a CKD, HLD, and schizophrenia presenting with fever and weakness.  He was seen in the ER on the day prior at [facility] with concern for sepsis.    He reports fever starting last week.  He doesn't have a thermometer, all subjective.  He thinks the fever was persistent for the week.  He has had urinary retention for the last few months and has had a foley since.  He has not had obvious urinary symptoms.  Last fever was last night.  He has been able to eat and drink well.  Not feeling SOB. No significant respiratory symptoms, has a "smoking cough."    ER Course:  [facility] transfer, per Dr. Bridgett Larsson:  66 yo WM with 1 day of fever, chills, SOB. hx of indwelling foley, CKD stage 3a.  Today, Scr up to 2.6 from baseline 1.67.  serum potassium 2.7.  UA obtained from new foley catheter. new hypoxia. on 2 L/min supplemental O2. CXR negative for pneumonia.  requested EDP give IV KCL and IV NS. IV ABX given before UA sent.      Review of Systems: As mentioned in the history of present illness. All other systems reviewed and are negative. Past Medical History:  Diagnosis Date   Anxiety    Cancer ([place])    prostate   CKD (chronic kidney disease) stage 3, GFR 30-59 ml/min (HCC)    Depression    GERD (gastroesophageal reflux disease)    under control   History of Clostridium difficile colitis    03-31-2015--  resolved   History of sepsis    secondary to UTI and ARF  x2 admission 03-31-2015 &  04-14-2015   Hyperlipidemia    Paranoid schizophrenia [facility])    Urinary incontinence    Past Surgical History:  Procedure Laterality  Date   CERVICAL DISC SURGERY  1990's   CYSTOSCOPY N/A 03/08/2015   Procedure: CYSTOSCOPY;  Surgeon: Lowella Bandy, MD;  Location: [facility];  Service: Urology;  Laterality: N/A;   CYSTOSCOPY  06/10/2015   Procedure: CYSTOSCOPY;  Surgeon: Cleon Gustin, MD;  Location: [facility];  Service: Urology;;   PROSTATE BIOPSY N/A 03/08/2015   Procedure: BIOPSY TRANSRECTAL ULTRASONIC PROSTATE (TUBP);  Surgeon: Lowella Bandy, MD;  Location: [facility];  Service: Urology;  Laterality: N/A;   TRANSTHORACIC ECHOCARDIOGRAM  06-11-2015   grade 1 diastolic dysfunction, ef 95-63%;  trivial TR   TRANSURETHRAL RESECTION OF BLADDER TUMOR N/A 03/08/2015   Procedure: TRANSURETHRAL RESECTION OF BLADDER TUMOR (TURBT);  Surgeon: Lowella Bandy, MD;  Location: [facility];  Service: Urology;  Laterality: N/A;  TUR BIOPSY BLADDER   TRANSURETHRAL RESECTION OF PROSTATE N/A 06/27/2015   Procedure: TRANSURETHRAL RESECTION OF THE PROSTATE WITH GYRUS INSTRUMENTS;  Surgeon: Cleon Gustin, MD;  Location: [place];  Service: Urology;  Laterality: N/A;   TRANSURETHRAL RESECTION OF PROSTATE N/A 07/09/2016   Procedure: TRANSURETHRAL RESECTION OF THE PROSTATE (TURP);  Surgeon: Cleon Gustin, MD;  Location: [facility];  Service: Urology;  Laterality: N/A;   Social History:  reports that he has been smoking cigarettes. He has a  40.00 pack-year smoking history. He has never used smokeless tobacco. He reports that he does not drink alcohol and does not use drugs.  No Known Allergies  Family History  Problem Relation Age of Onset   Breast cancer Mother     Prior to Admission medications   Medication Sig Start Date End Date Taking? Authorizing Provider  Ascorbic Acid (VITAMIN C) 1000 MG tablet Take 1,000 mg by mouth daily.    [provider]  aspirin EC 81 MG tablet Take 81 mg by mouth daily.    [provider]  benztropine (COGENTIN) 0.5 MG tablet Take 0.5 mg by mouth every  morning. 12/13/14   [provider]  bethanechol (URECHOLINE) 5 MG tablet Take 1 tablet (5 mg total) by mouth in the morning and at bedtime. 07/02/22   McKenzie, Candee Furbish, MD  Cholecalciferol (VITAMIN D) 50 MCG (2000 UT) CAPS Take 2,000 Units by mouth daily.    [provider]  cloZAPine (CLOZARIL) 100 MG tablet Take 400 mg by mouth at bedtime. 11/03/21   [provider]  doxycycline (VIBRAMYCIN) 100 MG capsule Take 100 mg by mouth 2 (two) times daily. 06/01/22   [provider]  finasteride (PROSCAR) 5 MG tablet TAKE 1 TABLET BY MOUTH EVERY DAY Patient taking differently: Take 5 mg by mouth daily. 05/17/20   McKenzie, Candee Furbish, MD  furosemide (LASIX) 20 MG tablet Take 20 mg by mouth daily as needed. 05/11/22   [provider]  Ipratropium-Albuterol (COMBIVENT RESPIMAT) 20-100 MCG/ACT AERS respimat INHALE 1 PUFF INTO THE LUNGS EVERY 6 HOURS AS NEEDED FOR WHEEZING OR SHORTNESS OF BREATH. 06/04/22   Chesley Mires, MD  Multiple Vitamin (MULTIVITAMIN WITH MINERALS) TABS tablet Take 1 tablet by mouth daily.    [provider]  polyethylene glycol (MIRALAX / GLYCOLAX) 17 g packet Take 17 g by mouth daily as needed for moderate constipation. 11/16/21   [name], MD  Potassium 99 MG TABS Take 99 mg by mouth daily.    [provider]  tamsulosin (FLOMAX) 0.4 MG CAPS capsule TAKE 1 CAPSULE BY MOUTH TWICE A DAY 05/24/22   McKenzie, Candee Furbish, MD  temazepam (RESTORIL) 15 MG capsule Take 15 mg by mouth at bedtime. 06/14/16   [provider]  vitamin E 180 MG (400 UNITS) capsule Take 400 Units by mouth daily.    [provider]  VRAYLAR 6 MG CAPS Take 6 mg by mouth every morning. 10/24/21   [provider]  zinc gluconate 50 MG tablet Take 50 mg by mouth daily.    [provider]    Physical Exam: Vitals:   07/06/22 1600 07/06/22 1700 07/06/22 1746 07/06/22 1827  BP: 102/65     Pulse: 84 86 82 83  Resp: (!) 29  (!) 22 (!) 29 (!) 30  Temp:      TempSrc:      SpO2: 91% 90% 93% 94%  Weight:      Height:       General:  Appears calm and comfortable and is in NAD, very soft-spoken; O2 sats 90% at rest and appears comfortable Eyes:   EOMI, normal lids, iris ENT:  grossly normal hearing, lips & tongue, mmm Neck:  no LAD, masses or thyromegaly Cardiovascular:  RRR, no m/r/g. No LE edema.  Respiratory:   CTA bilaterally with no wheezes/rales/rhonchi.  Normal respiratory effort. Abdomen:  soft, NT, ND Skin:  no rash or induration seen on limited exam Musculoskeletal:  grossly  normal tone BUE/BLE, good ROM, no bony abnormality Psychiatric:  blunted mood and affect, speech fluent and appropriate, AOx3 Neurologic:  CN 2-12 grossly intact, moves all extremities in coordinated fashion   Radiological Exams on Admission: Independently reviewed - see discussion in A/P where applicable  CT Chest Wo Contrast  Result Date: 07/06/2022 CLINICAL DATA:  Respiratory illness.  Nondiagnostic x-ray.  Cough. EXAM: CT CHEST WITHOUT CONTRAST TECHNIQUE: Multidetector CT imaging of the chest was performed following the standard protocol without IV contrast. RADIATION DOSE REDUCTION: This exam was performed according to the departmental dose-optimization program which includes automated exposure control, adjustment of the mA and/or kV according to patient size and/or use of iterative reconstruction technique. COMPARISON:  Chest radiography yesterday.  Chest CT 03/15/2022. FINDINGS: Cardiovascular: Heart size upper limits of normal. Tiny amount of pericardial fluid. Mild coronary artery calcification and aortic atherosclerotic calcification. Mediastinum/Nodes: No mass or lymphadenopathy. Lungs/Pleura: Bilateral pleural effusions layering dependently, tiny on the right and small on the left. Minimal atelectasis in the dependent right lower lobe. Moderate atelectasis and/or atelectatic pneumonia in the left lower lobe. Mild atelectasis  or atelectatic pneumonia in the lingula. Upper Abdomen: Negative Musculoskeletal: Ordinary thoracic degenerative changes. IMPRESSION: 1. Bilateral pleural effusions layering dependently, tiny on the right and small on the left. Minimal atelectasis in the dependent right lower lobe. Moderate atelectasis and/or atelectatic pneumonia in the left lower lobe. Mild atelectasis or atelectatic pneumonia in the lingula. 2. Aortic atherosclerosis. Mild coronary artery calcification. Aortic Atherosclerosis (ICD10-I70.0). Electronically Signed   By: Nelson Chimes M.D.   On: 07/06/2022 14:00   DG Chest Portable 1 View  Result Date: 07/05/2022 CLINICAL DATA:  Cough. EXAM: PORTABLE CHEST 1 VIEW COMPARISON:  Chest radiograph dated 06/03/2022. FINDINGS: Shallow inspiration with bibasilar atelectasis. No focal consolidation, pleural effusion, or pneumothorax. Stable cardiac silhouette. No acute osseous pathology. IMPRESSION: No active disease. Electronically Signed   By: Anner Crete M.D.   On: 07/05/2022 20:14    EKG: Independently reviewed.  NSR with rate 83; no evidence of acute ischemia   Labs on Admission: I have personally reviewed the available labs and imaging studies at the time of the admission.  Pertinent labs:    VBG: 7.350/56.3/30.6 Na++ 128 -> 137 K+ 2.9 -> 3.3 Glucose 152 BUN 22/Creatinine 2.08/GFR 35, improved from 25/2.66/26 on presentation; 21/1.67/46 on 11/5 Lactate 0.9 WBC 16.9 INR 1.2 COVID/flu negative UA: moderate Hgb, large LE, 30 protein, few bacteria, >50 WBC Blood and urine cultures pending   Assessment and Plan: Principal Problem:   AKI (acute kidney injury) ([place]) Active Problems:   HLD (hyperlipidemia)   Lower urinary tract infectious disease   Stage 3a chronic kidney disease (CKD) (HCC)   History of prostate cancer   Tobacco abuse   Schizophrenia ([place])    AKI on Stage 3a CKD -Patient with mild baseline compromised renal function  -Current creatinine is increased  at least 1.5 times compared to baseline and presumed to have occurred within the last 7 days -Likely due to prerenal secondary to dehydration in the setting of UTI -IVF -Follow up renal function by BMP -Avoid ACEI and NSAIDs  UTI with chronic indwelling foley -Fever, abnormal UA, has foley -Most likely source is complicated UTI -Continue Cefepime -Urine culture is pending -Has foley due to chronic urinary retention -Continue finasteride, bethanechol, tamsulosin  Tobacco dependence with hypoxia -Hypoxia to 90% on presentation and also at the time of my evaluation -He is asymptomatic -Atelectasis and/or COPD are most likely etiologies, infection  less likely -Will add prn O2 -Ambulatory pulse ox in AM -Tobacco Dependence: encourage cessation; this was discussed with the patient and should be reviewed on an ongoing basis.   -Patch ordered -Resume Combivent (reports not taking but fill history says otherwise according to med rec), change to standing and add prn albuterol  H/o prostate CA -Per his brother, they are doing watchful waiting -Will need outpatient urology f/u  Schizophrenia -Continue Cogentin, Clozapine, Restoril, Vraylar      Advance Care Planning:   Code Status: Full Code   Consults: TOC team, nutrition, PT/OT  DVT Prophylaxis: Lovenox  Family Communication: None present; I spoke with his brother by telephone at the time of admission  Severity of Illness: The appropriate patient status for this patient is OBSERVATION. Observation status is judged to be reasonable and necessary in order to provide the required intensity of service to ensure the patient's safety. The patient's presenting symptoms, physical exam findings, and initial radiographic and laboratory data in the context of their medical condition is felt to place them at decreased risk for further clinical deterioration. Furthermore, it is anticipated that the patient will be medically stable for discharge  from the hospital within 2 midnights of admission.   Author: Karmen Bongo, MD 07/06/2022 6:43 PM  For on call review [email or web address].

## 2022-07-06 NOTE — Progress Notes (Signed)
PHARMACIST - PHYSICIAN ORDER COMMUNICATION  ERICH KOCHAN is a 66 y.o. year old male with a history of schizoaffective disorder on Clozapine PTA. Continuing this medication order as an inpatient requires that monitoring parameters per REMS requirements must be met.  Clozapine REMS Dispense Authorization was obtained, and will dispense inpatient.  RDA code [id number].  Verified Clozapine dose: 400 mg PO at bedtime Last ANC value and date reported on the Clozapine REMS website: [id number] on 07/04/22 [place] monitoring frequency: Monthly  Next [place] reporting is due on (date) 08/04/21.  Gena Fray, PharmD PGY1 Pharmacy Resident   07/06/2022 5:23 PM

## 2022-07-06 NOTE — ED Notes (Addendum)
Error in charting.

## 2022-07-06 NOTE — ED Provider Notes (Addendum)
  Physical Exam  BP (!) 93/48   Pulse 88   Temp 98.6 F (37 C) (Oral)   Resp (!) 34   Ht '5\' 9"'$  (1.753 m)   Wt 101.6 kg   SpO2 91%   BMI 33.08 kg/m   Physical Exam  Procedures  .Critical Care  Performed by: Varney Biles, MD Authorized by: Varney Biles, MD   Critical care provider statement:    Critical care time (minutes):  32   Critical care was time spent personally by me on the following activities:  Development of treatment plan with patient or surrogate, discussions with consultants, evaluation of patient's response to treatment, examination of patient, ordering and review of laboratory studies, ordering and review of radiographic studies, ordering and performing treatments and interventions, pulse oximetry, re-evaluation of patient's condition and review of old charts   ED Course / MDM    Medical Decision Making Amount and/or Complexity of Data Reviewed Labs: ordered. Radiology: ordered. ECG/medicine tests: ordered.  Risk OTC drugs. Prescription drug management. Decision regarding hospitalization.   Patient has started having increased wheezing, his blood pressure has dropped slightly and he is having fevers.  Patient was seen in the ER yesterday for fevers and weakness. He has received broad-spectrum antibiotics and 1 L of IV fluid as a bolus followed by maintenance fluid.  Patient has history of UTI sepsis.  He is noted to have hyponatremia, hypokalemia and acute kidney injury.  I am ordering 1 more liter of IV fluid. For the wheezing patient is receiving more breathing treatments. I will follow-up with the pharmacy team to make sure patient has received all his antibiotics. We will give him some more oral potassium and recheck his metabolic profile.  I am adding urine culture to his workup given the previous history of UTI sepsis and UA that has multiple markers concerning for infection.    Varney Biles, MD 07/06/22 1321  2:36 PM CT  impression: IMPRESSION: 1. Bilateral pleural effusions layering dependently, tiny on the right and small on the left. Minimal atelectasis in the dependent right lower lobe. Moderate atelectasis and/or atelectatic pneumonia in the left lower lobe. Mild atelectasis or atelectatic pneumonia in the lingula. 2. Aortic atherosclerosis. Mild coronary artery calcification.  He has received appropriate antibiotics for right hip.    Varney Biles, MD 07/06/22 [phone]

## 2022-07-07 DIAGNOSIS — Z720 Tobacco use: Secondary | ICD-10-CM

## 2022-07-07 DIAGNOSIS — E860 Dehydration: Secondary | ICD-10-CM | POA: Diagnosis present

## 2022-07-07 DIAGNOSIS — Z79899 Other long term (current) drug therapy: Secondary | ICD-10-CM | POA: Diagnosis not present

## 2022-07-07 DIAGNOSIS — Z8546 Personal history of malignant neoplasm of prostate: Secondary | ICD-10-CM | POA: Diagnosis not present

## 2022-07-07 DIAGNOSIS — R0902 Hypoxemia: Secondary | ICD-10-CM | POA: Diagnosis present

## 2022-07-07 DIAGNOSIS — N390 Urinary tract infection, site not specified: Secondary | ICD-10-CM | POA: Diagnosis present

## 2022-07-07 DIAGNOSIS — N1831 Chronic kidney disease, stage 3a: Secondary | ICD-10-CM

## 2022-07-07 DIAGNOSIS — E669 Obesity, unspecified: Secondary | ICD-10-CM | POA: Diagnosis present

## 2022-07-07 DIAGNOSIS — F200 Paranoid schizophrenia: Secondary | ICD-10-CM | POA: Diagnosis present

## 2022-07-07 DIAGNOSIS — T83511A Infection and inflammatory reaction due to indwelling urethral catheter, initial encounter: Secondary | ICD-10-CM | POA: Diagnosis present

## 2022-07-07 DIAGNOSIS — F17210 Nicotine dependence, cigarettes, uncomplicated: Secondary | ICD-10-CM | POA: Diagnosis present

## 2022-07-07 DIAGNOSIS — Z7982 Long term (current) use of aspirin: Secondary | ICD-10-CM | POA: Diagnosis not present

## 2022-07-07 DIAGNOSIS — J984 Other disorders of lung: Secondary | ICD-10-CM | POA: Diagnosis present

## 2022-07-07 DIAGNOSIS — N179 Acute kidney failure, unspecified: Secondary | ICD-10-CM | POA: Diagnosis present

## 2022-07-07 DIAGNOSIS — Z8619 Personal history of other infectious and parasitic diseases: Secondary | ICD-10-CM | POA: Diagnosis not present

## 2022-07-07 DIAGNOSIS — E785 Hyperlipidemia, unspecified: Secondary | ICD-10-CM

## 2022-07-07 DIAGNOSIS — Z1152 Encounter for screening for COVID-19: Secondary | ICD-10-CM | POA: Diagnosis not present

## 2022-07-07 DIAGNOSIS — Z6833 Body mass index (BMI) 33.0-33.9, adult: Secondary | ICD-10-CM | POA: Diagnosis not present

## 2022-07-07 DIAGNOSIS — Y846 Urinary catheterization as the cause of abnormal reaction of the patient, or of later complication, without mention of misadventure at the time of the procedure: Secondary | ICD-10-CM | POA: Diagnosis present

## 2022-07-07 LAB — CBC
HCT: 40.4 % (ref 39.0–52.0)
Hemoglobin: 13.6 g/dL (ref 13.0–17.0)
MCH: 27.5 pg (ref 26.0–34.0)
MCHC: 33.7 g/dL (ref 30.0–36.0)
MCV: 81.6 fL (ref 80.0–100.0)
Platelets: 295 10*3/uL (ref 150–400)
RBC: 4.95 MIL/uL (ref 4.22–5.81)
RDW: 14.7 % (ref 11.5–15.5)
WBC: 10.1 10*3/uL (ref 4.0–10.5)
nRBC: 0 % (ref 0.0–0.2)

## 2022-07-07 LAB — BASIC METABOLIC PANEL
Anion gap: 11 (ref 5–15)
BUN: 21 mg/dL (ref 8–23)
CO2: 25 mmol/L (ref 22–32)
Calcium: 8.1 mg/dL — ABNORMAL LOW (ref 8.9–10.3)
Chloride: 104 mmol/L (ref 98–111)
Creatinine, Ser: 2.01 mg/dL — ABNORMAL HIGH (ref 0.61–1.24)
GFR, Estimated: 36 mL/min — ABNORMAL LOW (ref >60)
Glucose, Bld: 134 mg/dL — ABNORMAL HIGH (ref 70–99)
Potassium: 3.7 mmol/L (ref 3.5–5.1)
Sodium: 140 mmol/L (ref 135–145)

## 2022-07-07 LAB — HIV ANTIBODY (ROUTINE TESTING W REFLEX): HIV Screen 4th Generation wRfx: NONREACTIVE

## 2022-07-07 MED ORDER — SODIUM CHLORIDE 0.9 % IV SOLN
2.0000 g | Freq: Two times a day (BID) | INTRAVENOUS | Status: DC
  Administered 2022-07-07 – 2022-07-09 (×4): 2 g via INTRAVENOUS
  Filled 2022-07-07 (×4): qty 12.5

## 2022-07-07 MED ORDER — IPRATROPIUM-ALBUTEROL 0.5-2.5 (3) MG/3ML IN SOLN
1.0000 mL | Freq: Two times a day (BID) | RESPIRATORY_TRACT | Status: DC
  Administered 2022-07-07: 1 mL via RESPIRATORY_TRACT
  Filled 2022-07-07 (×2): qty 3

## 2022-07-07 MED ORDER — CHLORHEXIDINE GLUCONATE CLOTH 2 % EX PADS
6.0000 | MEDICATED_PAD | Freq: Every day | CUTANEOUS | Status: DC
  Administered 2022-07-08: 6 via TOPICAL

## 2022-07-07 NOTE — Evaluation (Signed)
Physical Therapy Evaluation Patient Details Name: Edwin [name] MRN: [id number] DOB: June 28, 1956 Today's Date: 07/07/2022  History of Present Illness  66 y.o. male admitted 12/7 presenting with fever and weakness. medical history significant of prostate CA, stage 3a CKD, HLD, and schizophrenia  Clinical Impression  Pt admitted with above diagnosis. Supervision with transfers and ambulation. Likely near baseline except for the hypoxia which did result in early fatigue which pt reports is abnormal. SpO2 difficult to obtains with poor waveform but seemed consistently in mid to low 80s on 2-3L supplemental O2. Then improved to 88-89 with 4L. 2/4 dyspnea. Cues for breathing techniques. Patient reports fatigue towards end of distance. HR in 90s  Pt currently with functional limitations due to the deficits listed below (see PT Problem List). Pt will benefit from skilled PT to increase their independence and safety with mobility to allow discharge to the venue listed below.          Recommendations for follow up therapy are one component of a multi-disciplinary discharge planning process, led by the attending physician.  Recommendations may be updated based on patient status, additional functional criteria and insurance authorization.  Follow Up Recommendations No PT follow up      Assistance Recommended at Discharge PRN  Patient can return home with the following  Assist for transportation    Equipment Recommendations None recommended by PT  Recommendations for Other Services       Functional Status Assessment Patient has had a recent decline in their functional status and demonstrates the ability to make significant improvements in function in a reasonable and predictable amount of time.     Precautions / Restrictions Precautions Precautions: Fall Precaution Comments: soft spoken Restrictions Weight Bearing Restrictions: No      Mobility  Bed Mobility Overal bed mobility:  Independent                  Transfers Overall transfer level: Needs assistance Equipment used: Rolling walker (2 wheels) Transfers: Sit to/from Stand Sit to Stand: Supervision           General transfer comment: supervision for safety. assist with multiple lines/leads only.  Good strength to power-up. Light use of RW upon standing.    Ambulation/Gait Ambulation/Gait assistance: Supervision Gait Distance (Feet): 85 Feet Assistive device: Rolling walker (2 wheels) Gait Pattern/deviations: Step-through pattern Gait velocity: decreased Gait velocity interpretation: 1.31 - 2.62 ft/sec, indicative of limited community ambulator   General Gait Details: Educated on safe AD use with RW. Lightly utilized, mostly to assist with equipment/lines leads. No overt LOB noted. SpO2 difficult to obtains with poor waveform but seemed consistently in mid to low 80s on 2-3L supplemental O2. Then improved to 88-89 with 4L. Patient reports fatigue towards end of distance. HR in 90s  Stairs            Wheelchair Mobility    Modified Rankin (Stroke Patients Only)       Balance Overall balance assessment: Mild deficits observed, not formally tested                                           Pertinent Vitals/Pain Pain Assessment Pain Assessment: No/denies pain    Home Living Family/patient expects to be discharged to:: Private residence Living Arrangements: Alone (Niece currently in rehab (will be alone for unkown time)) Available Help at Discharge: Family;Available PRN/intermittently Type of  Home: House Home Access: Stairs to enter   [employer] of Steps: 2   Home Layout: One level Home Equipment: None      Prior Function Prior Level of Function : Independent/Modified Independent               ADLs Comments: Drives on occasion.  Niece lives with him and assists as needed.     Hand Dominance   Dominant Hand: Right     Extremity/Trunk Assessment   Upper Extremity Assessment Upper Extremity Assessment: Defer to OT evaluation    Lower Extremity Assessment Lower Extremity Assessment: Generalized weakness    Cervical / Trunk Assessment Cervical / Trunk Assessment: Normal  Communication   Communication: No difficulties  Cognition Arousal/Alertness: Awake/alert Behavior During Therapy: Flat affect Overall Cognitive Status: No family/caregiver present to determine baseline cognitive functioning                                 General Comments: Oriented, following commands, very soft spoken, gives one word answers        General Comments      Exercises     Assessment/Plan    PT Assessment Patient needs continued PT services  PT Problem List Decreased strength;Decreased activity tolerance;Decreased balance;Decreased mobility;Cardiopulmonary status limiting activity       PT Treatment Interventions DME instruction;Gait training;Functional mobility training;Stair training;Therapeutic activities;Therapeutic exercise;Balance training;Neuromuscular re-education;Patient/family education    PT Goals (Current goals can be found in the Care Plan section)  Acute Rehab PT Goals Patient Stated Goal: get well PT Goal Formulation: With patient Time For Goal Achievement: 07/21/22 Potential to Achieve Goals: Good    Frequency Min 3X/week     Co-evaluation               AM-PAC PT "6 Clicks" Mobility  Outcome Measure Help needed turning from your back to your side while in a flat bed without using bedrails?: None Help needed moving from lying on your back to sitting on the side of a flat bed without using bedrails?: None Help needed moving to and from a bed to a chair (including a wheelchair)?: None Help needed standing up from a chair using your arms (e.g., wheelchair or bedside chair)?: A Little Help needed to walk in hospital room?: A Little Help needed climbing 3-5 steps with  a railing? : A Little 6 Click Score: 21    End of Session Equipment Utilized During Treatment: Gait belt;Oxygen Activity Tolerance: Patient tolerated treatment well Patient left: in chair;with call bell/phone within reach;with chair alarm set   PT Visit Diagnosis: Unsteadiness on feet (R26.81);Muscle weakness (generalized) (M62.81);Difficulty in walking, not elsewhere classified (R26.2)    Time: 8416-6063 PT Time Calculation (min) (ACUTE ONLY): 27 min   Charges:   PT Evaluation $PT Eval Low Complexity: 1 Low PT Treatments $Gait Training: 8-22 mins        Candie Mile, PT, DPT Physical Therapist Acute Rehabilitation Services [place] 07/07/2022, 10:49 AM

## 2022-07-07 NOTE — Evaluation (Signed)
Occupational Therapy Evaluation Patient Details Name: Edwin [name] MRN: [id number] DOB: 12-31-1955 Today's Date: 07/07/2022   History of Present Illness 66 y.o. male with medical history significant of prostate CA, stage 3a CKD, HLD, and schizophrenia presenting with fever and weakness.   Clinical Impression   Patient admitted for the diagnosis above.  PTA he lives at home, with his niece.  Currently the niece is in rehab in the [place] area.  He is currently presenting with generalize supervision for mobility and ADL completion, and is probably very close to his baseline.  He did desaturate briefly to 88% on RA, but maintained 90% otherwise.  The patient subjectively states he feels better, but is difficult to pry answerers from him.  OT will follow in the acute setting to ensure safety and ability to return home alone.  No post acute OT is being recommended at this point.        Recommendations for follow up therapy are one component of a multi-disciplinary discharge planning process, led by the attending physician.  Recommendations may be updated based on patient status, additional functional criteria and insurance authorization.   Follow Up Recommendations  No OT follow up     Assistance Recommended at Discharge PRN  Patient can return home with the following Assist for transportation    Functional Status Assessment  Patient has had a recent decline in their functional status and demonstrates the ability to make significant improvements in function in a reasonable and predictable amount of time.  Equipment Recommendations  None recommended by OT    Recommendations for Other Services       Precautions / Restrictions Precautions Precautions: Fall Precaution Comments: soft spoken Restrictions Weight Bearing Restrictions: No      Mobility Bed Mobility Overal bed mobility: Independent                  Transfers Overall transfer level: Needs assistance Equipment  used: None Transfers: Sit to/from Stand, Bed to chair/wheelchair/BSC Sit to Stand: Supervision     Step pivot transfers: Supervision            Balance Overall balance assessment: Needs assistance Sitting-balance support: Feet supported Sitting balance-Leahy Scale: Good     Standing balance support: No upper extremity supported Standing balance-Leahy Scale: Fair                             ADL either performed or assessed with clinical judgement   ADL Overall ADL's : At baseline                                       General ADL Comments: Generalized supervision due to lines.     Vision Patient Visual Report: No change from baseline       Perception     Praxis      Pertinent Vitals/Pain Pain Assessment Pain Assessment: No/denies pain     Hand Dominance Right   Extremity/Trunk Assessment Upper Extremity Assessment Upper Extremity Assessment: Overall WFL for tasks assessed   Lower Extremity Assessment Lower Extremity Assessment: Defer to PT evaluation   Cervical / Trunk Assessment Cervical / Trunk Assessment: Normal   Communication Communication Communication: No difficulties   Cognition Arousal/Alertness: Awake/alert Behavior During Therapy: Flat affect Overall Cognitive Status: No family/caregiver present to determine baseline cognitive functioning  General Comments: Oriented, following commands, very soft spoken, gives one word answers     General Comments   Watch sats on RA    Exercises     Shoulder Instructions      Home Living Family/patient expects to be discharged to:: Private residence Living Arrangements: Alone Available Help at Discharge: Family;Available PRN/intermittently Type of Home: House Home Access: Stairs to enter [employer] of Steps: 2   Home Layout: One level     Bathroom Shower/Tub: Occupational psychologist:  Standard Bathroom Accessibility: Yes How Accessible: Accessible via walker Home Equipment: None          Prior Functioning/Environment Prior Level of Function : Independent/Modified Independent               ADLs Comments: Drives on occasion.  Niece lives with him and assists as needed.        OT Problem List: Impaired balance (sitting and/or standing)      OT Treatment/Interventions: Self-care/ADL training;Therapeutic activities;Patient/family education;Balance training    OT Goals(Current goals can be found in the care plan section) Acute Rehab OT Goals Patient Stated Goal: Return home OT Goal Formulation: With patient Potential to Achieve Goals: Good ADL Goals Pt Will Perform Grooming: Independently;standing;sitting Pt Will Perform Lower Body Dressing: Independently;sit to/from stand Pt Will Transfer to Toilet: Independently;ambulating;regular height toilet  OT Frequency: Min 2X/week    Co-evaluation              AM-PAC OT "6 Clicks" Daily Activity     Outcome Measure Help from another person eating meals?: None Help from another person taking care of personal grooming?: None Help from another person toileting, which includes using toliet, bedpan, or urinal?: A Little Help from another person bathing (including washing, rinsing, drying)?: A Little Help from another person to put on and taking off regular upper body clothing?: None Help from another person to put on and taking off regular lower body clothing?: A Little 6 Click Score: 21   End of Session Equipment Utilized During Treatment: Oxygen Nurse Communication: Mobility status  Activity Tolerance: Patient tolerated treatment well Patient left: in chair;with call bell/phone within reach;with chair alarm set;with nursing/sitter in room  OT Visit Diagnosis: Unsteadiness on feet (R26.81)                Time: 4782-9562 OT Time Calculation (min): 19 min Charges:  OT General Charges $OT Visit: 1  Visit OT Evaluation $OT Eval Moderate Complexity: 1 Mod  07/07/2022  RP, OTR/L  Acute Rehabilitation Services  Office:  [phone]   Metta Clines 07/07/2022, 9:40 AM

## 2022-07-07 NOTE — Progress Notes (Signed)
PROGRESS NOTE        PATIENT DETAILS Name: Edwin [name] Age: 66 y.o. Sex: male Date of Birth: 1956-07-18 Admit Date: 07/05/2022 Admitting Physician Karmen Bongo, MD PCP:[facility]  Brief Summary: Patient is a 66 y.o.  male with history of chronic indwelling Foley catheter, CKD stage IIIa, prostate cancer, HLD, schizophrenia-who presented with fever-found to have complicated UTI and subsequently admitted to the hospitalist service.  Significant events: 12/07>> admitted TRH-fever due to complicated UTI (chronic indwelling Foley catheter)  Significant studies: 12/07>> CXR: No PNA 12/08>> CT chest: Tiny right pleural effusion, small left pleural effusion-no obvious pneumonia.  Significant microbiology data: 12/07>> COVID/influenza PCR: Negative 12/07>> blood culture: No growth 12/08>> urine culture: Pending  Procedures: None  Consults: None  Subjective: Lying comfortably in bed-denies any chest pain or shortness of breath.  Objective: Vitals: Blood pressure 109/64, pulse 90, temperature 98.7 F (37.1 C), temperature source Oral, resp. rate 20, height '5\' 9"'$  (1.753 m), weight 101.6 kg, SpO2 93 %.   Exam: Gen Exam:Alert awake-not in any distress HEENT:atraumatic, normocephalic Chest: B/L clear to auscultation anteriorly CVS:S1S2 regular Abdomen:soft non tender, non distended Extremities:no edema Neurology: Non focal Skin: no rash  Pertinent Labs/Radiology:    Latest Ref Rng & Units 07/07/2022    2:07 AM 07/05/2022   10:28 PM 07/05/2022    7:01 PM  CBC  WBC 4.0 - 10.5 K/uL 10.1   16.9   Hemoglobin 13.0 - 17.0 g/dL 13.6  14.6  15.0   Hematocrit 39.0 - 52.0 % 40.4  43.0  44.8   Platelets 150 - 400 K/uL 295   245     Lab Results  Component Value Date   NA 140 07/07/2022   K 3.7 07/07/2022   CL 104 07/07/2022   CO2 25 07/07/2022      Assessment/Plan: Complicated UTI Afebrile overnight Leukocytosis improved Patient feels  somewhat better Continue IV cefepime-follow cultures.   Suspect UTI related to chronic indwelling Foley catheter  AKI on CKD stage IIIa AKI likely hemodynamically mediated Creatinine slowly improving-but not yet at baseline Continue IVF-follow electrolytes Avoid nephrotoxic agents  History of prostate cancer Resume outpatient urology follow-up Currently being observed  Chronic indwelling Foley catheter On Flomax/finasteride/bethanechol Foley catheter apparently exchanged in the ED on admission Follow-up with urology post discharge.  Schizophrenia Appears stable Continue Cogentin/clozapine/Restoril/Vraylar  Obesity: Estimated body mass index is 33.08 kg/m as calculated from the following:   Height as of this encounter: '5\' 9"'$  (1.753 m).   Weight as of this encounter: 101.6 kg.   Code status:   Code Status: Full Code   DVT Prophylaxis: SQ lovenox   Family Communication: None at bedside  Disposition Plan: Status is: Observation The patient will require care spanning > 2 midnights and should be moved to inpatient because: Resolving AKI-complicated UTI-not yet stable for discharge.   Planned Discharge Destination:Home   Diet: Diet Order             Diet regular Room service appropriate? Yes; Fluid consistency: Thin  Diet effective now                     Antimicrobial agents: Anti-infectives (From admission, onward)    Start     Dose/Rate Route Frequency Ordered Stop   07/07/22 2200  ceFEPIme (MAXIPIME) 2 g in sodium chloride 0.9 %  100 mL IVPB        2 g 200 mL/hr over 30 Minutes Intravenous Every 12 hours 07/07/22 1000 07/13/22 0959   07/07/22 0100  vancomycin (VANCOCIN) IVPB 1000 mg/200 mL premix  Status:  Discontinued        1,000 mg 200 mL/hr over 60 Minutes Intravenous Every 24 hours 07/06/22 1124 07/06/22 1601   07/06/22 1000  ceFEPIme (MAXIPIME) 2 g in sodium chloride 0.9 % 100 mL IVPB  Status:  Discontinued        2 g 200 mL/hr over 30 Minutes  Intravenous Every 12 hours 07/05/22 2249 07/07/22 1000   07/05/22 2249  vancomycin variable dose per unstable renal function (pharmacist dosing)  Status:  Discontinued         Does not apply See admin instructions 07/05/22 2249 07/06/22 1124   07/05/22 2145  vancomycin (VANCOCIN) IVPB 1000 mg/200 mL premix       See Hyperspace for full Linked Orders Report.   1,000 mg 200 mL/hr over 60 Minutes Intravenous  Once 07/05/22 2030 07/06/22 0314   07/05/22 2030  ceFEPIme (MAXIPIME) 2 g in sodium chloride 0.9 % 100 mL IVPB        2 g 200 mL/hr over 30 Minutes Intravenous  Once 07/05/22 2016 07/05/22 2121   07/05/22 2030  metroNIDAZOLE (FLAGYL) IVPB 500 mg        500 mg 100 mL/hr over 60 Minutes Intravenous  Once 07/05/22 2016 07/05/22 2232   07/05/22 2030  vancomycin (VANCOCIN) IVPB 1000 mg/200 mL premix  Status:  Discontinued        1,000 mg 200 mL/hr over 60 Minutes Intravenous  Once 07/05/22 2016 07/05/22 2030   07/05/22 2030  vancomycin (VANCOCIN) IVPB 1000 mg/200 mL premix       See Hyperspace for full Linked Orders Report.   1,000 mg 200 mL/hr over 60 Minutes Intravenous  Once 07/05/22 2030 07/06/22 0101        MEDICATIONS: Scheduled Meds:  aspirin EC  81 mg Oral Daily   benztropine  0.5 mg Oral q morning   bethanechol  5 mg Oral BID   cariprazine  6 mg Oral q morning   cloZAPine  400 mg Oral QHS   docusate sodium  100 mg Oral BID   enoxaparin (LOVENOX) injection  50 mg Subcutaneous Q24H   finasteride  5 mg Oral Daily   ipratropium-albuterol  1 mL Inhalation Q6H   nicotine  14 mg Transdermal Daily   sodium chloride flush  3 mL Intravenous Q12H   tamsulosin  0.4 mg Oral BID   temazepam  15 mg Oral QHS   Continuous Infusions:  sodium chloride 75 mL/hr at 07/07/22 0726   ceFEPime (MAXIPIME) IV     PRN Meds:.acetaminophen **OR** acetaminophen, albuterol, bisacodyl, hydrALAZINE, ondansetron **OR** ondansetron (ZOFRAN) IV, oxyCODONE, polyethylene glycol   I have personally  reviewed following labs and imaging studies  LABORATORY DATA: CBC: Recent Labs  Lab 07/05/22 1901 07/05/22 2228 07/07/22 0207  WBC 16.9*  --  10.1  NEUTROABS 14.1*  --   --   HGB 15.0 14.6 13.6  HCT 44.8 43.0 40.4  MCV 80.3  --  81.6  PLT 245  --  979    Basic Metabolic Panel: Recent Labs  Lab 07/05/22 1901 07/05/22 2024 07/05/22 2228 07/06/22 0822 07/07/22 0207  NA 128*  --  129* 137 140  K 2.9*  --  3.2* 3.3* 3.7  CL 88*  --   --  100 104  CO2 24  --   --  25 25  GLUCOSE 158*  --   --  152* 134*  BUN 25*  --   --  22 21  CREATININE 2.66*  --   --  2.08* 2.01*  CALCIUM 8.7*  --   --  8.1* 8.1*  MG  --  1.9  --   --   --     GFR: Estimated Creatinine Clearance: 43.6 mL/min (A) (by C-G formula based on SCr of 2.01 mg/dL (H)).  Liver Function Tests: Recent Labs  Lab 07/05/22 1901  AST 17  ALT 22  ALKPHOS 100  BILITOT 0.5  PROT 7.7  ALBUMIN 4.1   No results for input(s): "LIPASE", "AMYLASE" in the last 168 hours. Recent Labs  Lab 07/05/22 2230  AMMONIA 45*    Coagulation Profile: Recent Labs  Lab 07/05/22 1901  INR 1.2    Cardiac Enzymes: No results for input(s): "CKTOTAL", "CKMB", "CKMBINDEX", "TROPONINI" in the last 168 hours.  BNP (last 3 results) Recent Labs    02/01/22 1509  PROBNP 14.0    Lipid Profile: No results for input(s): "CHOL", "HDL", "LDLCALC", "TRIG", "CHOLHDL", "LDLDIRECT" in the last 72 hours.  Thyroid Function Tests: No results for input(s): "TSH", "T4TOTAL", "FREET4", "T3FREE", "THYROIDAB" in the last 72 hours.  Anemia Panel: No results for input(s): "VITAMINB12", "FOLATE", "FERRITIN", "TIBC", "IRON", "RETICCTPCT" in the last 72 hours.  Urine analysis:    Component Value Date/Time   COLORURINE YELLOW 07/05/2022 2155   APPEARANCEUR HAZY (A) 07/05/2022 2155   LABSPEC <1.005 (L) 07/05/2022 2155   PHURINE 6.0 07/05/2022 2155   GLUCOSEU NEGATIVE 07/05/2022 2155   HGBUR MODERATE (A) 07/05/2022 2155   BILIRUBINUR  NEGATIVE 07/05/2022 2155   KETONESUR NEGATIVE 07/05/2022 2155   PROTEINUR 30 (A) 07/05/2022 2155   UROBILINOGEN 0.2 04/14/2015 1447   NITRITE NEGATIVE 07/05/2022 2155   LEUKOCYTESUR LARGE (A) 07/05/2022 2155    Sepsis Labs: Lactic Acid, Venous    Component Value Date/Time   LATICACIDVEN 0.9 07/05/2022 1902    MICROBIOLOGY: Recent Results (from the past 240 hour(s))  Blood culture (routine x 2)     Status: None (Preliminary result)   Collection Time: 07/05/22  6:50 PM   Specimen: BLOOD  Result Value Ref Range Status   Specimen Description   Final    BLOOD LEFT ANTECUBITAL Performed at [facility], [address], [place], [place] [address]    Special Requests   Final    Blood Culture adequate volume BOTTLES DRAWN AEROBIC AND ANAEROBIC Performed at [facility], [address], [place], [place] [address]    Culture   Final    NO GROWTH 2 DAYS Performed at [facility], [place] [address] [place], [place] [address]    Report Status PENDING  Incomplete  Blood culture (routine x 2)     Status: None (Preliminary result)   Collection Time: 07/05/22  7:05 PM   Specimen: BLOOD  Result Value Ref Range Status   Specimen Description   Final    BLOOD BLOOD LEFT FOREARM Performed at [facility], [address] [place], [place] [address]    Special Requests   Final    Blood Culture adequate volume BOTTLES DRAWN AEROBIC ONLY Performed at [facility], [address] [place], [place] [address]    Culture   Final    NO GROWTH 2 DAYS Performed at [facility], [place] [address],  [place], [place] [address]    Report Status PENDING  Incomplete  Resp Panel by RT-PCR (Flu A&B, Covid) Anterior Nasal Swab     Status: None   Collection Time: 07/05/22  8:24 PM   Specimen: Anterior Nasal Swab  Result Value Ref Range Status   SARS Coronavirus 2 by RT PCR NEGATIVE NEGATIVE Final    Comment:  (NOTE) SARS-CoV-2 target nucleic acids are NOT DETECTED.  The SARS-CoV-2 RNA is generally detectable in upper respiratory specimens during the acute phase of infection. The lowest concentration of SARS-CoV-2 viral copies this assay can detect is 138 copies/mL. A negative result does not preclude SARS-Cov-2 infection and should not be used as the sole basis for treatment or other patient management decisions. A negative result may occur with  improper specimen collection/handling, submission of specimen other than nasopharyngeal swab, presence of viral mutation(s) within the areas targeted by this assay, and inadequate number of viral copies(<138 copies/mL). A negative result must be combined with clinical observations, patient history, and epidemiological information. The expected result is Negative.  Fact Sheet for Patients:  EntrepreneurPulse.com.au  Fact Sheet for Healthcare Providers:  IncredibleEmployment.be  This test is no t yet approved or cleared by the [place] FDA and  has been authorized for detection and/or diagnosis of SARS-CoV-2 by FDA under an Emergency Use Authorization (EUA). This EUA will remain  in effect (meaning this test can be used) for the duration of the COVID-19 declaration under Section 564(b)(1) of the Act, 21 U.S.C.section 360bbb-3(b)(1), unless the authorization is terminated  or revoked sooner.       Influenza A by PCR NEGATIVE NEGATIVE Final   Influenza B by PCR NEGATIVE NEGATIVE Final    Comment: (NOTE) The Xpert Xpress SARS-CoV-2/FLU/RSV plus assay is intended as an aid in the diagnosis of influenza from Nasopharyngeal swab specimens and should not be used as a sole basis for treatment. Nasal washings and aspirates are unacceptable for Xpert Xpress SARS-CoV-2/FLU/RSV testing.  Fact Sheet for Patients: EntrepreneurPulse.com.au  Fact Sheet for Healthcare  Providers: IncredibleEmployment.be  This test is not yet approved or cleared by the [place] FDA and has been authorized for detection and/or diagnosis of SARS-CoV-2 by FDA under an Emergency Use Authorization (EUA). This EUA will remain in effect (meaning this test can be used) for the duration of the COVID-19 declaration under Section 564(b)(1) of the Act, 21 U.S.C. section 360bbb-3(b)(1), unless the authorization is terminated or revoked.  Performed at [facility], [address], [place], [place] [address]     RADIOLOGY STUDIES/RESULTS: CT Chest Wo Contrast  Result Date: 07/06/2022 CLINICAL DATA:  Respiratory illness.  Nondiagnostic x-ray.  Cough. EXAM: CT CHEST WITHOUT CONTRAST TECHNIQUE: Multidetector CT imaging of the chest was performed following the standard protocol without IV contrast. RADIATION DOSE REDUCTION: This exam was performed according to the departmental dose-optimization program which includes automated exposure control, adjustment of the mA and/or kV according to patient size and/or use of iterative reconstruction technique. COMPARISON:  Chest radiography yesterday.  Chest CT 03/15/2022. FINDINGS: Cardiovascular: Heart size upper limits of normal. Tiny amount of pericardial fluid. Mild coronary artery calcification and aortic atherosclerotic calcification. Mediastinum/Nodes: No mass or lymphadenopathy. Lungs/Pleura: Bilateral pleural effusions layering dependently, tiny on the right and small on the left. Minimal atelectasis in the dependent right lower lobe. Moderate atelectasis and/or atelectatic pneumonia in the left lower lobe. Mild atelectasis or atelectatic pneumonia in the lingula. Upper Abdomen: Negative Musculoskeletal: Ordinary thoracic degenerative changes. IMPRESSION: 1. Bilateral pleural effusions  layering dependently, tiny on the right and small on the left. Minimal atelectasis in the dependent right lower lobe.  Moderate atelectasis and/or atelectatic pneumonia in the left lower lobe. Mild atelectasis or atelectatic pneumonia in the lingula. 2. Aortic atherosclerosis. Mild coronary artery calcification. Aortic Atherosclerosis (ICD10-I70.0). Electronically Signed   By: Nelson Chimes M.D.   On: 07/06/2022 14:00   DG Chest Portable 1 View  Result Date: 07/05/2022 CLINICAL DATA:  Cough. EXAM: PORTABLE CHEST 1 VIEW COMPARISON:  Chest radiograph dated 06/03/2022. FINDINGS: Shallow inspiration with bibasilar atelectasis. No focal consolidation, pleural effusion, or pneumothorax. Stable cardiac silhouette. No acute osseous pathology. IMPRESSION: No active disease. Electronically Signed   By: Anner Crete M.D.   On: 07/05/2022 20:14     LOS: 0 days   Oren Binet, MD  [facility]    To contact the attending provider between 7A-7P or the covering provider during after hours 7P-7A, please log into the web site [email or web address] and access using universal [place] password for that web site. If you do not have the password, please call the hospital operator.  07/07/2022, 10:25 AM

## 2022-07-07 NOTE — TOC Initial Note (Signed)
Transition of Care [facility]) - Initial/Assessment Note    Patient Details  Name: Edwin [name] MRN: [id number] Date of Birth: 12-09-1955  [facility] CM/SW Contact:    Carles Collet, RN Phone Number: 07/07/2022, 3:25 PM  Clinical Narrative:                    Patient admitted from home, he states that he lives w niece. He manages foley independently, has PCP w [facility].       Patient Goals and CMS Choice        Expected Discharge Plan and Services                                                Prior Living Arrangements/Services                       Activities of Daily Living Home Assistive Devices/Equipment: None ADL Screening (condition at time of admission) Patient's cognitive ability adequate to safely complete daily activities?: Yes Is the patient deaf or have difficulty hearing?: No Does the patient have difficulty seeing, even when wearing glasses/contacts?: No Does the patient have difficulty concentrating, remembering, or making decisions?: No Patient able to express need for assistance with ADLs?: Yes Does the patient have difficulty dressing or bathing?: No Independently performs ADLs?: Yes (appropriate for developmental age) Does the patient have difficulty walking or climbing stairs?: No Weakness of Legs: None Weakness of Arms/Hands: None  Permission Sought/Granted                  Emotional Assessment              Admission diagnosis:  AKI (acute kidney injury) ([place]) [N17.9] Sepsis with acute renal failure without septic shock, due to unspecified organism, unspecified acute renal failure type ([place]) [A41.9, R65.20, N17.9] Patient Active Problem List   Diagnosis Date Noted   Schizophrenia ([place]) 07/06/2022   Restrictive lung disease 02/01/2022   Bilateral lower extremity edema 02/01/2022   Tobacco abuse 02/01/2022   Severe sepsis ([place]) 11/08/2021   Stage 3a chronic kidney disease (CKD) ([place]) 11/08/2021    History of prostate cancer 11/08/2021   Hypokalemia 11/08/2021   Hypomagnesemia 11/08/2021   Hyperglycemia 11/08/2021   Chronic cough    Hypothermia 08/10/2017   Leukocytosis 08/10/2017   Hyponatremia 08/10/2017   BPH (benign prostatic hyperplasia) 06/27/2015   Malnutrition of moderate degree 06/13/2015   Lower urinary tract infectious disease 06/10/2015   Enteritis due to Clostridium difficile 04/04/2015   Sepsis ([place]) 03/31/2015   Acute encephalopathy 03/31/2015   Sepsis secondary to UTI ([place]) 01/13/2015   Paranoid schizophrenia, chronic condition ([place]) 01/13/2015   AKI (acute kidney injury) ([place]) 01/13/2015   Rhabdomyolysis 01/13/2015   HLD (hyperlipidemia) 01/13/2015   PCP:  [facility] Pharmacy:   [id number] [place], [place] - [id number]: [phone]: [phone]    Social Determinants of Health (SDOH) Interventions    Readmission Risk Interventions     No data to display

## 2022-07-07 NOTE — Progress Notes (Signed)
Mobility Specialist Progress Note:   07/07/22 1506  Mobility  Activity Ambulated with assistance in hallway  Level of Assistance Standby assist, set-up cues, supervision of patient - no hands on  Assistive Device Front wheel walker  Distance Ambulated (ft) 150 ft  Activity Response Tolerated well  Mobility Referral Yes  $Mobility charge 1 Mobility   Pt received in bed and agreeable. No complaints. Pt left in bed with all needs met and call bell in reach.   Andrey Campanile Mobility Specialist Please contact via SecureChat or  Rehab office at [phone]

## 2022-07-07 NOTE — Care Management Obs Status (Signed)
[place] NOTIFICATION   Patient Details  Name: Edwin [name] MRN: [id number] Date of Birth: January 20, 1956   Medicare Observation Status Notification Given:  Yes    Carles Collet, RN 07/07/2022, 3:24 PM

## 2022-07-07 NOTE — Plan of Care (Signed)

## 2022-07-08 DIAGNOSIS — N390 Urinary tract infection, site not specified: Secondary | ICD-10-CM

## 2022-07-08 LAB — URINE CULTURE: Culture: NO GROWTH

## 2022-07-08 MED ORDER — ENSURE MAX PROTEIN PO LIQD
11.0000 [oz_av] | Freq: Every day | ORAL | Status: DC
  Administered 2022-07-08: 11 [oz_av] via ORAL
  Filled 2022-07-08 (×2): qty 330

## 2022-07-08 MED ORDER — ENSURE ENLIVE PO LIQD
237.0000 mL | ORAL | Status: DC
  Administered 2022-07-09: 237 mL via ORAL

## 2022-07-08 MED ORDER — ADULT MULTIVITAMIN W/MINERALS CH
1.0000 | ORAL_TABLET | Freq: Every day | ORAL | Status: DC
  Administered 2022-07-08 – 2022-07-09 (×2): 1 via ORAL
  Filled 2022-07-08 (×2): qty 1

## 2022-07-08 NOTE — Progress Notes (Signed)
PROGRESS NOTE        PATIENT DETAILS Name: Edwin [name] Age: 66 y.o. Sex: male Date of Birth: 10-29-1955 Admit Date: 07/05/2022 Admitting Physician Evalee Mutton Kristeen Mans, MD PCP:[facility]  Brief Summary: Patient is a 66 y.o.  male with history of chronic indwelling Foley catheter, CKD stage IIIa, prostate cancer, HLD, schizophrenia-who presented with fever-found to have complicated UTI and subsequently admitted to the hospitalist service.  Significant events: 12/07>> admitted [facility]-fever due to complicated UTI (chronic indwelling Foley catheter)  Significant studies: 12/07>> CXR: No PNA 12/08>> CT chest: Tiny right pleural effusion, small left pleural effusion-no obvious pneumonia.  Significant microbiology data: 12/07>> COVID/influenza PCR: Negative 12/07>> blood culture: No growth 12/08>> urine culture: No growth  Procedures: None  Consults: None  Subjective: Feels better-sitting at bedside chair.  Afebrile overnight.  Objective: Vitals: Blood pressure 113/75, pulse 80, temperature 98.1 F (36.7 C), temperature source Oral, resp. rate 20, height '5\' 9"'$  (1.753 m), weight 101.6 kg, SpO2 95 %.   Exam: Gen Exam:Alert awake-not in any distress HEENT:atraumatic, normocephalic Chest: B/L clear to auscultation anteriorly CVS:S1S2 regular Abdomen:soft non tender, non distended Extremities:no edema Neurology: Non focal Skin: no rash  Pertinent Labs/Radiology:    Latest Ref Rng & Units 07/07/2022    2:07 AM 07/05/2022   10:28 PM 07/05/2022    7:01 PM  CBC  WBC 4.0 - 10.5 K/uL 10.1   16.9   Hemoglobin 13.0 - 17.0 g/dL 13.6  14.6  15.0   Hematocrit 39.0 - 52.0 % 40.4  43.0  44.8   Platelets 150 - 400 K/uL 295   245     Lab Results  Component Value Date   NA 140 07/07/2022   K 3.7 07/07/2022   CL 104 07/07/2022   CO2 25 07/07/2022      Assessment/Plan: Complicated UTI Improving  Leukocytosis has resolved  Afebrile overnight   Urine cultures negative but seems to have been drawn after antibiotics given.   Plan is to continue empiric cefepime and transition to oral antimicrobial therapy on discharge.     Suspect UTI related to chronic indwelling Foley catheter  AKI on CKD stage IIIa AKI likely hemodynamically mediated Creatinine improving-not yet at baseline  Encourage oral intake and stop IVF today.   Avoid nephrotoxic agents  Follow electrolytes.  History of prostate cancer Resume outpatient urology follow-up Currently being observed  Chronic indwelling Foley catheter On Flomax/finasteride/bethanechol Foley catheter apparently exchanged in the ED on admission Follow-up with urology post discharge.  Schizophrenia Appears stable Continue Cogentin/clozapine/Restoril/Vraylar  Obesity: Estimated body mass index is 33.08 kg/m as calculated from the following:   Height as of this encounter: '5\' 9"'$  (1.753 m).   Weight as of this encounter: 101.6 kg.   Code status:   Code Status: Full Code   DVT Prophylaxis: SQ lovenox   Family Communication: None at bedside  Disposition Plan: Status is: Observation The patient will require care spanning > 2 midnights and should be moved to inpatient because: Resolving AKI-complicated UTI-not yet stable for discharge.   Planned Discharge Destination:Home on 12/11.   Diet: Diet Order             Diet regular Room service appropriate? Yes; Fluid consistency: Thin  Diet effective now  Antimicrobial agents: Anti-infectives (From admission, onward)    Start     Dose/Rate Route Frequency Ordered Stop   07/07/22 2200  ceFEPIme (MAXIPIME) 2 g in sodium chloride 0.9 % 100 mL IVPB        2 g 200 mL/hr over 30 Minutes Intravenous Every 12 hours 07/07/22 1000 07/13/22 0959   07/07/22 0100  vancomycin (VANCOCIN) IVPB 1000 mg/200 mL premix  Status:  Discontinued        1,000 mg 200 mL/hr over 60 Minutes Intravenous Every 24 hours 07/06/22  1124 07/06/22 1601   07/06/22 1000  ceFEPIme (MAXIPIME) 2 g in sodium chloride 0.9 % 100 mL IVPB  Status:  Discontinued        2 g 200 mL/hr over 30 Minutes Intravenous Every 12 hours 07/05/22 2249 07/07/22 1000   07/05/22 2249  vancomycin variable dose per unstable renal function (pharmacist dosing)  Status:  Discontinued         Does not apply See admin instructions 07/05/22 2249 07/06/22 1124   07/05/22 2145  vancomycin (VANCOCIN) IVPB 1000 mg/200 mL premix       See Hyperspace for full Linked Orders Report.   1,000 mg 200 mL/hr over 60 Minutes Intravenous  Once 07/05/22 2030 07/06/22 0314   07/05/22 2030  ceFEPIme (MAXIPIME) 2 g in sodium chloride 0.9 % 100 mL IVPB        2 g 200 mL/hr over 30 Minutes Intravenous  Once 07/05/22 2016 07/05/22 2121   07/05/22 2030  metroNIDAZOLE (FLAGYL) IVPB 500 mg        500 mg 100 mL/hr over 60 Minutes Intravenous  Once 07/05/22 2016 07/05/22 2232   07/05/22 2030  vancomycin (VANCOCIN) IVPB 1000 mg/200 mL premix  Status:  Discontinued        1,000 mg 200 mL/hr over 60 Minutes Intravenous  Once 07/05/22 2016 07/05/22 2030   07/05/22 2030  vancomycin (VANCOCIN) IVPB 1000 mg/200 mL premix       See Hyperspace for full Linked Orders Report.   1,000 mg 200 mL/hr over 60 Minutes Intravenous  Once 07/05/22 2030 07/06/22 0101        MEDICATIONS: Scheduled Meds:  aspirin EC  81 mg Oral Daily   benztropine  0.5 mg Oral q morning   bethanechol  5 mg Oral BID   cariprazine  6 mg Oral q morning   Chlorhexidine Gluconate Cloth  6 each Topical Daily   cloZAPine  400 mg Oral QHS   docusate sodium  100 mg Oral BID   enoxaparin (LOVENOX) injection  50 mg Subcutaneous Q24H   finasteride  5 mg Oral Daily   nicotine  14 mg Transdermal Daily   sodium chloride flush  3 mL Intravenous Q12H   tamsulosin  0.4 mg Oral BID   temazepam  15 mg Oral QHS   Continuous Infusions:  sodium chloride 75 mL/hr at 07/08/22 0629   ceFEPime (MAXIPIME) IV 2 g (07/08/22 0907)    PRN Meds:.acetaminophen **OR** acetaminophen, albuterol, bisacodyl, hydrALAZINE, ondansetron **OR** ondansetron (ZOFRAN) IV, oxyCODONE, polyethylene glycol   I have personally reviewed following labs and imaging studies  LABORATORY DATA: CBC: Recent Labs  Lab 07/05/22 1901 07/05/22 2228 07/07/22 0207  WBC 16.9*  --  10.1  NEUTROABS 14.1*  --   --   HGB 15.0 14.6 13.6  HCT 44.8 43.0 40.4  MCV 80.3  --  81.6  PLT 245  --  295     Basic Metabolic Panel: Recent Labs  Lab 07/05/22 1901 07/05/22 2024 07/05/22 2228 07/06/22 0822 07/07/22 0207  NA 128*  --  129* 137 140  K 2.9*  --  3.2* 3.3* 3.7  CL 88*  --   --  100 104  CO2 24  --   --  25 25  GLUCOSE 158*  --   --  152* 134*  BUN 25*  --   --  22 21  CREATININE 2.66*  --   --  2.08* 2.01*  CALCIUM 8.7*  --   --  8.1* 8.1*  MG  --  1.9  --   --   --      GFR: Estimated Creatinine Clearance: 43.6 mL/min (A) (by C-G formula based on SCr of 2.01 mg/dL (H)).  Liver Function Tests: Recent Labs  Lab 07/05/22 1901  AST 17  ALT 22  ALKPHOS 100  BILITOT 0.5  PROT 7.7  ALBUMIN 4.1    No results for input(s): "LIPASE", "AMYLASE" in the last 168 hours. Recent Labs  Lab 07/05/22 2230  AMMONIA 45*     Coagulation Profile: Recent Labs  Lab 07/05/22 1901  INR 1.2     Cardiac Enzymes: No results for input(s): "CKTOTAL", "CKMB", "CKMBINDEX", "TROPONINI" in the last 168 hours.  BNP (last 3 results) Recent Labs    02/01/22 1509  PROBNP 14.0     Lipid Profile: No results for input(s): "CHOL", "HDL", "LDLCALC", "TRIG", "CHOLHDL", "LDLDIRECT" in the last 72 hours.  Thyroid Function Tests: No results for input(s): "TSH", "T4TOTAL", "FREET4", "T3FREE", "THYROIDAB" in the last 72 hours.  Anemia Panel: No results for input(s): "VITAMINB12", "FOLATE", "FERRITIN", "TIBC", "IRON", "RETICCTPCT" in the last 72 hours.  Urine analysis:    Component Value Date/Time   COLORURINE YELLOW 07/05/2022 2155    APPEARANCEUR HAZY (A) 07/05/2022 2155   LABSPEC <1.005 (L) 07/05/2022 2155   PHURINE 6.0 07/05/2022 2155   GLUCOSEU NEGATIVE 07/05/2022 2155   HGBUR MODERATE (A) 07/05/2022 2155   BILIRUBINUR NEGATIVE 07/05/2022 2155   KETONESUR NEGATIVE 07/05/2022 2155   PROTEINUR 30 (A) 07/05/2022 2155   UROBILINOGEN 0.2 04/14/2015 1447   NITRITE NEGATIVE 07/05/2022 2155   LEUKOCYTESUR LARGE (A) 07/05/2022 2155    Sepsis Labs: Lactic Acid, Venous    Component Value Date/Time   LATICACIDVEN 0.9 07/05/2022 1902    MICROBIOLOGY: Recent Results (from the past 240 hour(s))  Blood culture (routine x 2)     Status: None (Preliminary result)   Collection Time: 07/05/22  6:50 PM   Specimen: BLOOD  Result Value Ref Range Status   Specimen Description   Final    BLOOD LEFT ANTECUBITAL Performed at [facility], [address], [place], [place] [address]    Special Requests   Final    Blood Culture adequate volume BOTTLES DRAWN AEROBIC AND ANAEROBIC Performed at [facility], [address] [place], [place] [address]    Culture   Final    NO GROWTH 2 DAYS Performed at [facility], [place] [address], [place], [place] [address]    Report Status PENDING  Incomplete  Blood culture (routine x 2)     Status: None (Preliminary result)   Collection Time: 07/05/22  7:05 PM   Specimen: BLOOD  Result Value Ref Range Status   Specimen Description   Final    BLOOD BLOOD LEFT FOREARM Performed at [facility], [address] [place], [place] [address]    Special Requests   Final    Blood  Culture adequate volume BOTTLES DRAWN AEROBIC ONLY Performed at [facility], [address] [place], [place] [address]    Culture   Final    NO GROWTH 2 DAYS Performed at [facility], [place] [address], [place], [place] [address]    Report Status PENDING  Incomplete  Resp Panel by RT-PCR (Flu A&B, Covid) Anterior Nasal Swab      Status: None   Collection Time: 07/05/22  8:24 PM   Specimen: Anterior Nasal Swab  Result Value Ref Range Status   SARS Coronavirus 2 by RT PCR NEGATIVE NEGATIVE Final    Comment: (NOTE) SARS-CoV-2 target nucleic acids are NOT DETECTED.  The SARS-CoV-2 RNA is generally detectable in upper respiratory specimens during the acute phase of infection. The lowest concentration of SARS-CoV-2 viral copies this assay can detect is 138 copies/mL. A negative result does not preclude SARS-Cov-2 infection and should not be used as the sole basis for treatment or other patient management decisions. A negative result may occur with  improper specimen collection/handling, submission of specimen other than nasopharyngeal swab, presence of viral mutation(s) within the areas targeted by this assay, and inadequate number of viral copies(<138 copies/mL). A negative result must be combined with clinical observations, patient history, and epidemiological information. The expected result is Negative.  Fact Sheet for Patients:  EntrepreneurPulse.com.au  Fact Sheet for Healthcare Providers:  IncredibleEmployment.be  This test is no t yet approved or cleared by the [place] FDA and  has been authorized for detection and/or diagnosis of SARS-CoV-2 by FDA under an Emergency Use Authorization (EUA). This EUA will remain  in effect (meaning this test can be used) for the duration of the COVID-19 declaration under Section 564(b)(1) of the Act, 21 U.S.C.section 360bbb-3(b)(1), unless the authorization is terminated  or revoked sooner.       Influenza A by PCR NEGATIVE NEGATIVE Final   Influenza B by PCR NEGATIVE NEGATIVE Final    Comment: (NOTE) The Xpert Xpress SARS-CoV-2/FLU/RSV plus assay is intended as an aid in the diagnosis of influenza from Nasopharyngeal swab specimens and should not be used as a sole basis for treatment. Nasal washings and aspirates are  unacceptable for Xpert Xpress SARS-CoV-2/FLU/RSV testing.  Fact Sheet for Patients: EntrepreneurPulse.com.au  Fact Sheet for Healthcare Providers: IncredibleEmployment.be  This test is not yet approved or cleared by the [place] FDA and has been authorized for detection and/or diagnosis of SARS-CoV-2 by FDA under an Emergency Use Authorization (EUA). This EUA will remain in effect (meaning this test can be used) for the duration of the COVID-19 declaration under Section 564(b)(1) of the Act, 21 U.S.C. section 360bbb-3(b)(1), unless the authorization is terminated or revoked.  Performed at [facility], [address] [place], [place] [address]   Urine Culture     Status: None   Collection Time: 07/06/22  1:22 PM   Specimen: Urine, Clean Catch  Result Value Ref Range Status   Specimen Description   Final    URINE, CLEAN CATCH Performed at [facility], [address], [place], [place] [address]    Special Requests   Final    NONE Performed at [facility], [address] [place], [place] [address]    Culture   Final    NO GROWTH Performed at [facility], [place] [address] [place],  [address]    Report Status 07/08/2022 FINAL  Final    RADIOLOGY STUDIES/RESULTS: CT Chest Wo Contrast  Result Date:  07/06/2022 CLINICAL DATA:  Respiratory illness.  Nondiagnostic x-ray.  Cough. EXAM: CT CHEST WITHOUT CONTRAST TECHNIQUE: Multidetector CT imaging of the chest was performed following the standard protocol without IV contrast. RADIATION DOSE REDUCTION: This exam was performed according to the departmental dose-optimization program which includes automated exposure control, adjustment of the mA and/or kV according to patient size and/or use of iterative reconstruction technique. COMPARISON:  Chest radiography yesterday.  Chest CT 03/15/2022. FINDINGS: Cardiovascular: Heart  size upper limits of normal. Tiny amount of pericardial fluid. Mild coronary artery calcification and aortic atherosclerotic calcification. Mediastinum/Nodes: No mass or lymphadenopathy. Lungs/Pleura: Bilateral pleural effusions layering dependently, tiny on the right and small on the left. Minimal atelectasis in the dependent right lower lobe. Moderate atelectasis and/or atelectatic pneumonia in the left lower lobe. Mild atelectasis or atelectatic pneumonia in the lingula. Upper Abdomen: Negative Musculoskeletal: Ordinary thoracic degenerative changes. IMPRESSION: 1. Bilateral pleural effusions layering dependently, tiny on the right and small on the left. Minimal atelectasis in the dependent right lower lobe. Moderate atelectasis and/or atelectatic pneumonia in the left lower lobe. Mild atelectasis or atelectatic pneumonia in the lingula. 2. Aortic atherosclerosis. Mild coronary artery calcification. Aortic Atherosclerosis (ICD10-I70.0). Electronically Signed   By: Nelson Chimes M.D.   On: 07/06/2022 14:00     LOS: 1 day   Oren Binet, MD  [facility]    To contact the attending provider between 7A-7P or the covering provider during after hours 7P-7A, please log into the web site [email or web address] and access using universal [place] password for that web site. If you do not have the password, please call the hospital operator.  07/08/2022, 10:45 AM

## 2022-07-08 NOTE — Progress Notes (Signed)
Initial Nutrition Assessment RD working remotely.   DOCUMENTATION CODES:   Obesity unspecified  INTERVENTION:  - ordered Ensure Plus High Protein once/day, each supplement provides 350 kcal and 20 grams of protein.  - ordered Ensure Max once/day, each supplement provides 150 kcal and 30 grams of protein.  - ordered 1 tablet multivitamin with minerals/day.  - complete NFPE when feasible.   NUTRITION DIAGNOSIS:   Increased nutrient needs related to acute illness as evidenced by estimated needs.  GOAL:   Patient will meet greater than or equal to 90% of their needs  MONITOR:   PO intake, Supplement acceptance, Labs, Weight trends  REASON FOR ASSESSMENT:   Consult Assessment of nutrition requirement/status  ASSESSMENT:   66 y.o. male with medical history of chronic indwelling Foley catheter, stage 3 CKD, prostate cancer, HLD, GERD, depression, anxiety, and schizophrenia. He presented to the ED due to fever and was found to have a complicated UTI.  MST of 0. Patient ate 25% of lunch yesterday and no other meal intakes documented since diet advanced from NPO to Regular on 12/8 at 1600.  Patient has not been assessed by a [place] RD since 05/2015.   Weight on 12/7 was 224 lb. Weight on 11/5 was 229 lb. This indicates 5 lb weight loss (2.2% body weight) in the past 1 month; not significant for time frame. No information documented in the edema section of flow sheet.  Per notes: - complicated UTI--improving - AKI on stage 3 CKD--improving   Labs reviewed; creatinine: 2.01 mg/dl, Ca: 8.1 mg/dl, GFR: 36 ml/min. Medications reviewed; 100 mg colace BID.     NUTRITION - FOCUSED PHYSICAL EXAM:  RD working remotely.  Diet Order:   Diet Order             Diet regular Room service appropriate? Yes; Fluid consistency: Thin  Diet effective now                   EDUCATION NEEDS:   No education needs have been identified at this time  Skin:  Skin Assessment:  Reviewed RN Assessment  Last BM:  12/10 (type 5 x1, large amount)  Height:   Ht Readings from Last 1 Encounters:  07/05/22 '5\' 9"'$  (1.753 m)    Weight:   Wt Readings from Last 1 Encounters:  07/05/22 101.6 kg     BMI:  Body mass index is 33.08 kg/m.  Estimated Nutritional Needs:  Kcal:  2100-2300 kcal Protein:  105-115 grams Fluid:  >/= 2.3 L/day     Jarome Matin, MS, RD, LDN, CNSC Clinical Dietitian PRN/Relief staff On-call/weekend pager # available in [facility]

## 2022-07-09 ENCOUNTER — Other Ambulatory Visit (HOSPITAL_COMMUNITY): Payer: Self-pay

## 2022-07-09 LAB — BASIC METABOLIC PANEL
Anion gap: 9 (ref 5–15)
BUN: 19 mg/dL (ref 8–23)
CO2: 26 mmol/L (ref 22–32)
Calcium: 8.6 mg/dL — ABNORMAL LOW (ref 8.9–10.3)
Chloride: 105 mmol/L (ref 98–111)
Creatinine, Ser: 1.68 mg/dL — ABNORMAL HIGH (ref 0.61–1.24)
GFR, Estimated: 45 mL/min — ABNORMAL LOW (ref >60)
Glucose, Bld: 132 mg/dL — ABNORMAL HIGH (ref 70–99)
Potassium: 3.7 mmol/L (ref 3.5–5.1)
Sodium: 140 mmol/L (ref 135–145)

## 2022-07-09 MED ORDER — CEFDINIR 300 MG PO CAPS
300.0000 mg | ORAL_CAPSULE | Freq: Two times a day (BID) | ORAL | 0 refills | Status: AC
  Filled 2022-07-09: qty 4, 2d supply, fill #0

## 2022-07-09 NOTE — Progress Notes (Signed)
Mobility Specialist Progress Note:   07/09/22 1045  Mobility  Activity Ambulated with assistance in hallway  Level of Assistance Standby assist, set-up cues, supervision of patient - no hands on  Assistive Device Front wheel walker  Distance Ambulated (ft) 200 ft  Activity Response Tolerated well  Mobility Referral Yes  $Mobility charge 1 Mobility   Pt agreeable to mobility session. Ambulated at supervision level, on RA SpO2 >90% throughout. Pt back in bed with all needs met. Bed alarm on.   Nelta Numbers Mobility Specialist Please contact via SecureChat or  Rehab office at [phone]

## 2022-07-09 NOTE — Plan of Care (Signed)

## 2022-07-09 NOTE — Discharge Summary (Signed)
PATIENT DETAILS Name: Edwin [name] Age: 66 y.o. Sex: male Date of Birth: November 24, 1955 MRN: [id number]. Admitting Physician: Jonetta Osgood, MD PCP:[facility]  Admit Date: 07/05/2022 Discharge date: 07/09/2022  Recommendations for Outpatient Follow-up:  Follow up with PCP in 1-2 weeks Please obtain CMP/CBC in one week Please ensure continued outpatient follow-up with psychiatry/neurology  Admitted From:  Home  Disposition: Home   Discharge Condition: fair  CODE STATUS:   Code Status: Full Code   Diet recommendation:  Diet Order             Diet general           Diet regular Room service appropriate? Yes; Fluid consistency: Thin  Diet effective now                    Brief Summary: Patient is a 66 y.o.  male with history of chronic indwelling Foley catheter, CKD stage IIIa, prostate cancer, HLD, schizophrenia-who presented with fever-found to have complicated UTI and subsequently admitted to the hospitalist service.   Significant events: 12/07>> admitted [facility]-fever due to complicated UTI (chronic indwelling Foley catheter)   Significant studies: 12/07>> CXR: No PNA 12/08>> CT chest: Tiny right pleural effusion, small left pleural effusion-no obvious pneumonia.   Significant microbiology data: 12/07>> COVID/influenza PCR: Negative 12/07>> blood culture: No growth 12/08>> urine culture: No growth   Procedures: None   Consults: None  Brief Hospital Course: Complicated UTI Improving, no further leukocytosis-afebrile for more than 2 days Urine cultures negative but seems to have been drawn after antibiotics given.   Received cefepime in the hospital-we will transition to oral cephalosporin on discharge.    Suspect UTI related to chronic indwelling Foley catheter   AKI on CKD stage IIIa AKI likely hemodynamically mediated Creatinine improved with supportive care-and back to baseline.  History of restrictive lung disease Denies any  shortness of breath with exertion O2 saturations in the low 90s on room air this morning Followed by [facility] for continued follow-up in the outpatient setting.   History of prostate cancer Resume outpatient urology follow-up Currently being observed   Chronic indwelling Foley catheter On Flomax/finasteride/bethanechol Foley catheter apparently exchanged in the ED on admission Follow-up with urology post discharge.   Schizophrenia Appears stable Continue Cogentin/clozapine/Restoril/Vraylar   Obesity: Estimated body mass index is 33.08 kg/m as calculated from the following:   Height as of this encounter: '5\' 9"'$  (1.753 m).   Weight as of this encounter: 101.6 kg.   Nutrition Status: Nutrition Problem: Increased nutrient needs Etiology: acute illness Signs/Symptoms: estimated needs Interventions: Premier Protein, MVI, Ensure Enlive (each supplement provides 350kcal and 20 grams of protein)    Discharge Diagnoses:  Principal Problem:   AKI (acute kidney injury) ([place]) Active Problems:   HLD (hyperlipidemia)   Lower urinary tract infectious disease   Stage 3a chronic kidney disease (CKD) (HCC)   History of prostate cancer   Tobacco abuse   Schizophrenia ([place])   Discharge Instructions:  Activity:  As tolerated with Full fall precautions use walker/cane & assistance as needed  Discharge Instructions     Diet general   Complete by: As directed    Discharge instructions   Complete by: As directed    Follow with Primary MD  [facility] in 1-2 weeks  Please get a complete blood count and chemistry panel checked by your Primary MD at your next visit, and again as instructed by your Primary MD.  Get Medicines reviewed and  adjusted: Please take all your medications with you for your next visit with your Primary MD  Laboratory/radiological data: Please request your Primary MD to go over all hospital tests and procedure/radiological results at the follow up, please  ask your Primary MD to get all Hospital records sent to his/her office.  In some cases, they will be blood work, cultures and biopsy results pending at the time of your discharge. Please request that your primary care M.D. follows up on these results.  Also Note the following: If you experience worsening of your admission symptoms, develop shortness of breath, life threatening emergency, suicidal or homicidal thoughts you must seek medical attention immediately by calling 911 or calling your MD immediately  if symptoms less severe.  You must read complete instructions/literature along with all the possible adverse reactions/side effects for all the Medicines you take and that have been prescribed to you. Take any new Medicines after you have completely understood and accpet all the possible adverse reactions/side effects.   Do not drive when taking Pain medications or sleeping medications (Benzodaizepines)  Do not take more than prescribed Pain, Sleep and Anxiety Medications. It is not advisable to combine anxiety,sleep and pain medications without talking with your primary care practitioner  Special Instructions: If you have smoked or chewed Tobacco  in the last 2 yrs please stop smoking, stop any regular Alcohol  and or any Recreational drug use.  Wear Seat belts while driving.  Please note: You were cared for by a hospitalist during your hospital stay. Once you are discharged, your primary care physician will handle any further medical issues. Please note that NO REFILLS for any discharge medications will be authorized once you are discharged, as it is imperative that you return to your primary care physician (or establish a relationship with a primary care physician if you do not have one) for your post hospital discharge needs so that they can reassess your need for medications and monitor your lab values.   Increase activity slowly   Complete by: As directed       Allergies as of  07/09/2022   No Known Allergies      Medication List     TAKE these medications    acetaminophen 325 MG tablet Commonly known as: TYLENOL Take 650 mg by mouth every 6 (six) hours as needed for moderate pain.   aspirin EC 81 MG tablet Take 81 mg by mouth daily.   benztropine 0.5 MG tablet Commonly known as: COGENTIN Take 0.5 mg by mouth every morning.   bethanechol 5 MG tablet Commonly known as: URECHOLINE Take 1 tablet (5 mg total) by mouth in the morning and at bedtime.   cefdinir 300 MG capsule Commonly known as: OMNICEF Take 1 capsule (300 mg total) by mouth 2 (two) times daily for 2 days.   cloZAPine 100 MG tablet Commonly known as: CLOZARIL Take 400 mg by mouth at bedtime.   Combivent Respimat 20-100 MCG/ACT Aers respimat Generic drug: Ipratropium-Albuterol INHALE 1 PUFF INTO THE LUNGS EVERY 6 HOURS AS NEEDED FOR WHEEZING OR SHORTNESS OF BREATH.   finasteride 5 MG tablet Commonly known as: PROSCAR TAKE 1 TABLET BY MOUTH EVERY DAY   furosemide 20 MG tablet Commonly known as: LASIX Take 20 mg by mouth daily as needed for fluid or edema.   polyethylene glycol 17 g packet Commonly known as: MIRALAX / GLYCOLAX Take 17 g by mouth daily as needed for moderate constipation.   Potassium 99 MG Tabs Take  99 mg by mouth daily.   tamsulosin 0.4 MG Caps capsule Commonly known as: FLOMAX TAKE 1 CAPSULE BY MOUTH TWICE A DAY   temazepam 15 MG capsule Commonly known as: RESTORIL Take 15 mg by mouth at bedtime.   Vraylar 6 MG Caps Generic drug: Cariprazine HCl Take 6 mg by mouth every morning.        Follow-up Information     [facility]. Schedule an appointment as soon as possible for a visit in 1 week(s).   Specialty: Internal Medicine Contact information: [place] [address] [phone]                No Known Allergies   Other Procedures/Studies: CT Chest Wo Contrast  Result Date: 07/06/2022 CLINICAL DATA:   Respiratory illness.  Nondiagnostic x-ray.  Cough. EXAM: CT CHEST WITHOUT CONTRAST TECHNIQUE: Multidetector CT imaging of the chest was performed following the standard protocol without IV contrast. RADIATION DOSE REDUCTION: This exam was performed according to the departmental dose-optimization program which includes automated exposure control, adjustment of the mA and/or kV according to patient size and/or use of iterative reconstruction technique. COMPARISON:  Chest radiography yesterday.  Chest CT 03/15/2022. FINDINGS: Cardiovascular: Heart size upper limits of normal. Tiny amount of pericardial fluid. Mild coronary artery calcification and aortic atherosclerotic calcification. Mediastinum/Nodes: No mass or lymphadenopathy. Lungs/Pleura: Bilateral pleural effusions layering dependently, tiny on the right and small on the left. Minimal atelectasis in the dependent right lower lobe. Moderate atelectasis and/or atelectatic pneumonia in the left lower lobe. Mild atelectasis or atelectatic pneumonia in the lingula. Upper Abdomen: Negative Musculoskeletal: Ordinary thoracic degenerative changes. IMPRESSION: 1. Bilateral pleural effusions layering dependently, tiny on the right and small on the left. Minimal atelectasis in the dependent right lower lobe. Moderate atelectasis and/or atelectatic pneumonia in the left lower lobe. Mild atelectasis or atelectatic pneumonia in the lingula. 2. Aortic atherosclerosis. Mild coronary artery calcification. Aortic Atherosclerosis (ICD10-I70.0). Electronically Signed   By: Nelson Chimes M.D.   On: 07/06/2022 14:00   DG Chest Portable 1 View  Result Date: 07/05/2022 CLINICAL DATA:  Cough. EXAM: PORTABLE CHEST 1 VIEW COMPARISON:  Chest radiograph dated 06/03/2022. FINDINGS: Shallow inspiration with bibasilar atelectasis. No focal consolidation, pleural effusion, or pneumothorax. Stable cardiac silhouette. No acute osseous pathology. IMPRESSION: No active disease. Electronically  Signed   By: Anner Crete M.D.   On: 07/05/2022 20:14     TODAY-DAY OF DISCHARGE:  Subjective:   Jamelle Rushing today has no headache,no chest abdominal pain,no new weakness tingling or numbness, feels much better wants to go home today.   Objective:   Blood pressure 114/72, pulse 84, temperature 98.7 F (37.1 C), temperature source Oral, resp. rate (!) 33, height '5\' 9"'$  (1.753 m), weight 101.6 kg, SpO2 93 %.  Intake/Output Summary (Last 24 hours) at 07/09/2022 1024 Last data filed at 07/09/2022 0300 Gross per 24 hour  Intake 909.5 ml  Output 2750 ml  Net -1840.5 ml   Filed Weights   07/05/22 1836  Weight: 101.6 kg    Exam: Awake Alert, Oriented *3, No new F.N deficits, Normal affect [place].AT,PERRAL Supple Neck,No JVD, No cervical lymphadenopathy appriciated.  Symmetrical Chest wall movement, Good air movement bilaterally, CTAB RRR,No Gallops,Rubs or new Murmurs, No Parasternal Heave +ve B.Sounds, Abd Soft, Non tender, No organomegaly appriciated, No rebound -guarding or rigidity. No Cyanosis, Clubbing or edema, No new Rash or bruise   PERTINENT RADIOLOGIC STUDIES: No results found.   PERTINENT LAB RESULTS: CBC: Recent  Labs    07/07/22 0207  WBC 10.1  HGB 13.6  HCT 40.4  PLT 295   CMET CMP     Component Value Date/Time   NA 140 07/09/2022 0325   K 3.7 07/09/2022 0325   CL 105 07/09/2022 0325   CO2 26 07/09/2022 0325   GLUCOSE 132 (H) 07/09/2022 0325   BUN 19 07/09/2022 0325   CREATININE 1.68 (H) 07/09/2022 0325   CREATININE 1.50 (H) 12/12/2021 1556   CALCIUM 8.6 (L) 07/09/2022 0325   PROT 7.7 07/05/2022 1901   ALBUMIN 4.1 07/05/2022 1901   AST 17 07/05/2022 1901   ALT 22 07/05/2022 1901   ALKPHOS 100 07/05/2022 1901   BILITOT 0.5 07/05/2022 1901   GFRNONAA 45 (L) 07/09/2022 0325   GFRAA 48 (L) 08/14/2017 0504    GFR Estimated Creatinine Clearance: 52.2 mL/min (A) (by C-G formula based on SCr of 1.68 mg/dL (H)). No results for input(s):  "LIPASE", "AMYLASE" in the last 72 hours. No results for input(s): "CKTOTAL", "CKMB", "CKMBINDEX", "TROPONINI" in the last 72 hours. Invalid input(s): "POCBNP" No results for input(s): "DDIMER" in the last 72 hours. No results for input(s): "HGBA1C" in the last 72 hours. No results for input(s): "CHOL", "HDL", "LDLCALC", "TRIG", "CHOLHDL", "LDLDIRECT" in the last 72 hours. No results for input(s): "TSH", "T4TOTAL", "T3FREE", "THYROIDAB" in the last 72 hours.  Invalid input(s): "FREET3" No results for input(s): "VITAMINB12", "FOLATE", "FERRITIN", "TIBC", "IRON", "RETICCTPCT" in the last 72 hours. Coags: No results for input(s): "INR" in the last 72 hours.  Invalid input(s): "PT" Microbiology: Recent Results (from the past 240 hour(s))  Blood culture (routine x 2)     Status: None (Preliminary result)   Collection Time: 07/05/22  6:50 PM   Specimen: BLOOD  Result Value Ref Range Status   Specimen Description   Final    BLOOD LEFT ANTECUBITAL Performed at [facility], [address] [place], [place] [address]    Special Requests   Final    Blood Culture adequate volume BOTTLES DRAWN AEROBIC AND ANAEROBIC Performed at [facility], [address] [place], [place] [address]    Culture   Final    NO GROWTH 4 DAYS Performed at [facility], [place] [address] [place], [place] [address]    Report Status PENDING  Incomplete  Blood culture (routine x 2)     Status: None (Preliminary result)   Collection Time: 07/05/22  7:05 PM   Specimen: BLOOD  Result Value Ref Range Status   Specimen Description   Final    BLOOD BLOOD LEFT FOREARM Performed at [facility], [address] [place], [place] [address]    Special Requests   Final    Blood Culture adequate volume BOTTLES DRAWN AEROBIC ONLY Performed at [facility], [address] [place], [place] [address]    Culture   Final    NO GROWTH 4  DAYS Performed at [facility], [place] [address], [place], [place] [address]    Report Status PENDING  Incomplete  Resp Panel by RT-PCR (Flu A&B, Covid) Anterior Nasal Swab     Status: None   Collection Time: 07/05/22  8:24 PM   Specimen: Anterior Nasal Swab  Result Value Ref Range Status   SARS Coronavirus 2 by RT PCR NEGATIVE NEGATIVE Final    Comment: (NOTE) SARS-CoV-2 target nucleic acids are NOT DETECTED.  The SARS-CoV-2 RNA is generally detectable in upper respiratory specimens during the acute phase of  infection. The lowest concentration of SARS-CoV-2 viral copies this assay can detect is 138 copies/mL. A negative result does not preclude SARS-Cov-2 infection and should not be used as the sole basis for treatment or other patient management decisions. A negative result may occur with  improper specimen collection/handling, submission of specimen other than nasopharyngeal swab, presence of viral mutation(s) within the areas targeted by this assay, and inadequate number of viral copies(<138 copies/mL). A negative result must be combined with clinical observations, patient history, and epidemiological information. The expected result is Negative.  Fact Sheet for Patients:  EntrepreneurPulse.com.au  Fact Sheet for Healthcare Providers:  IncredibleEmployment.be  This test is no t yet approved or cleared by the [place] FDA and  has been authorized for detection and/or diagnosis of SARS-CoV-2 by FDA under an Emergency Use Authorization (EUA). This EUA will remain  in effect (meaning this test can be used) for the duration of the COVID-19 declaration under Section 564(b)(1) of the Act, 21 U.S.C.section 360bbb-3(b)(1), unless the authorization is terminated  or revoked sooner.       Influenza A by PCR NEGATIVE NEGATIVE Final   Influenza B by PCR NEGATIVE NEGATIVE Final    Comment: (NOTE) The Xpert Xpress SARS-CoV-2/FLU/RSV plus  assay is intended as an aid in the diagnosis of influenza from Nasopharyngeal swab specimens and should not be used as a sole basis for treatment. Nasal washings and aspirates are unacceptable for Xpert Xpress SARS-CoV-2/FLU/RSV testing.  Fact Sheet for Patients: EntrepreneurPulse.com.au  Fact Sheet for Healthcare Providers: IncredibleEmployment.be  This test is not yet approved or cleared by the [place] FDA and has been authorized for detection and/or diagnosis of SARS-CoV-2 by FDA under an Emergency Use Authorization (EUA). This EUA will remain in effect (meaning this test can be used) for the duration of the COVID-19 declaration under Section 564(b)(1) of the Act, 21 U.S.C. section 360bbb-3(b)(1), unless the authorization is terminated or revoked.  Performed at [facility], [address], [place], [place] [address]   Urine Culture     Status: None   Collection Time: 07/06/22  1:22 PM   Specimen: Urine, Clean Catch  Result Value Ref Range Status   Specimen Description   Final    URINE, CLEAN CATCH Performed at [facility], [address] [place], [place] [address]    Special Requests   Final    NONE Performed at [facility], [address] [place], [place] [address]    Culture   Final    NO GROWTH Performed at [facility], [place] [address] [place], [place] [address]    Report Status 07/08/2022 FINAL  Final    FURTHER DISCHARGE INSTRUCTIONS:  Get Medicines reviewed and adjusted: Please take all your medications with you for your next visit with your Primary MD  Laboratory/radiological data: Please request your Primary MD to go over all hospital tests and procedure/radiological results at the follow up, please ask your Primary MD to get all Hospital records sent to his/her office.  In some cases, they will be blood work, cultures and biopsy results pending  at the time of your discharge. Please request that your primary care M.D. goes through all the records of your hospital data and follows up on these results.  Also Note the following: If you experience worsening of your admission symptoms, develop shortness of breath, life threatening emergency, suicidal or homicidal thoughts you must seek medical attention immediately by calling 911 or calling your MD immediately  if symptoms less severe.  You must read complete instructions/literature along with all the possible adverse reactions/side effects for all the Medicines you take and that have been prescribed to you. Take any new Medicines after you have completely understood and accpet all the possible adverse reactions/side effects.   Do not drive when taking Pain medications or sleeping medications (Benzodaizepines)  Do not take more than prescribed Pain, Sleep and Anxiety Medications. It is not advisable to combine anxiety,sleep and pain medications without talking with your primary care practitioner  Special Instructions: If you have smoked or chewed Tobacco  in the last 2 yrs please stop smoking, stop any regular Alcohol  and or any Recreational drug use.  Wear Seat belts while driving.  Please note: You were cared for by a hospitalist during your hospital stay. Once you are discharged, your primary care physician will handle any further medical issues. Please note that NO REFILLS for any discharge medications will be authorized once you are discharged, as it is imperative that you return to your primary care physician (or establish a relationship with a primary care physician if you do not have one) for your post hospital discharge needs so that they can reassess your need for medications and monitor your lab values.  Total Time spent coordinating discharge including counseling, education and face to face time equals greater than 30 minutes.  SignedOren Binet 07/09/2022 10:24 AM

## 2022-07-10 LAB — CULTURE, BLOOD (ROUTINE X 2)
Culture: NO GROWTH
Culture: NO GROWTH
Special Requests: ADEQUATE
Special Requests: ADEQUATE

## 2022-07-16 ENCOUNTER — Other Ambulatory Visit: Payer: Self-pay | Admitting: Internal Medicine

## 2022-07-17 LAB — CBC WITH DIFFERENTIAL/PLATELET
Absolute Monocytes: 542 cells/uL (ref 200–950)
Basophils Absolute: 77 cells/uL (ref 0–200)
Basophils Relative: 0.9 %
Eosinophils Absolute: 155 cells/uL (ref 15–500)
Eosinophils Relative: 1.8 %
HCT: 47.7 % (ref 38.5–50.0)
Hemoglobin: 15.9 g/dL (ref 13.2–17.1)
Lymphs Abs: 1961 cells/uL (ref 850–3900)
MCH: 27.3 pg (ref 27.0–33.0)
MCHC: 33.3 g/dL (ref 32.0–36.0)
MCV: 81.8 fL (ref 80.0–100.0)
MPV: 10.2 fL (ref 7.5–12.5)
Monocytes Relative: 6.3 %
Neutro Abs: 5865 cells/uL (ref 1500–7800)
Neutrophils Relative %: 68.2 %
Platelets: 405 10*3/uL — ABNORMAL HIGH (ref 140–400)
RBC: 5.83 10*6/uL — ABNORMAL HIGH (ref 4.20–5.80)
RDW: 14.1 % (ref 11.0–15.0)
Total Lymphocyte: 22.8 %
WBC: 8.6 10*3/uL (ref 3.8–10.8)

## 2022-07-17 LAB — COMPLETE METABOLIC PANEL WITH GFR
AG Ratio: 1.5 (calc) (ref 1.0–2.5)
ALT: 27 U/L (ref 9–46)
AST: 15 U/L (ref 10–35)
Albumin: 4 g/dL (ref 3.6–5.1)
Alkaline phosphatase (APISO): 91 U/L (ref 35–144)
BUN/Creatinine Ratio: 12 (calc) (ref 6–22)
BUN: 20 mg/dL (ref 7–25)
CO2: 27 mmol/L (ref 20–32)
Calcium: 9.5 mg/dL (ref 8.6–10.3)
Chloride: 99 mmol/L (ref 98–110)
Creat: 1.72 mg/dL — ABNORMAL HIGH (ref 0.70–1.35)
Globulin: 2.7 g/dL (calc) (ref 1.9–3.7)
Glucose, Bld: 138 mg/dL — ABNORMAL HIGH (ref 65–99)
Potassium: 4.5 mmol/L (ref 3.5–5.3)
Sodium: 143 mmol/L (ref 135–146)
Total Bilirubin: 0.3 mg/dL (ref 0.2–1.2)
Total Protein: 6.7 g/dL (ref 6.1–8.1)
eGFR: 44 mL/min/{1.73_m2} — ABNORMAL LOW (ref >=60)

## 2022-09-06 ENCOUNTER — Ambulatory Visit (INDEPENDENT_AMBULATORY_CARE_PROVIDER_SITE_OTHER): Payer: 59 | Admitting: Podiatry

## 2022-09-06 DIAGNOSIS — M79675 Pain in left toe(s): Secondary | ICD-10-CM | POA: Diagnosis not present

## 2022-09-06 DIAGNOSIS — M79674 Pain in right toe(s): Secondary | ICD-10-CM | POA: Diagnosis not present

## 2022-09-06 DIAGNOSIS — B351 Tinea unguium: Secondary | ICD-10-CM | POA: Diagnosis not present

## 2022-09-06 MED ORDER — CICLOPIROX 8 % EX SOLN: Freq: Every day | CUTANEOUS | 2 refills | Status: DC

## 2022-09-06 NOTE — Progress Notes (Signed)
Subjective: Chief Complaint  Patient presents with   Foot Problem    Nail trim, swelling    67 year old male presents the office today for thick, elongated nails he is not able to trim himself.  He states the swelling is much improved has not been swelling.  He did get the compression socks it is hard to put on.  No open wounds.  No other concerns.     Objective: AAO x3, NAD DP/PT pulses palpable bilaterally, CRT less than 3 seconds No significant swelling present today bilaterally. Nails are hypertrophic, dystrophic, brittle, discolored, elongated 10. No surrounding redness or drainage. Tenderness nails 1-5 bilaterally. No open lesions or pre-ulcerative lesions are identified today. No pain with calf compression, swelling, warmth, erythema  Assessment: Symptomatic onychomycosis  Plan: -All treatment options discussed with the patient including all alternatives, risks, complications.  -Sharply debrided nails x 2 without any complications or bleeding.  Discussed treatment options. [place] prescribed.  -Encouraged compression, elevation. -Patient encouraged to call the office with any questions, concerns, change in symptoms.   Trula Slade DPM

## 2022-09-06 NOTE — Patient Instructions (Signed)
Ciclopirox Topical Solution What is this medication? CICLOPIROX (sye kloe PEER ox) treats fungal infections of the nails. It belongs to a group of medications called antifungals. It will not treat infections caused by bacteria or viruses. This medicine may be used for other purposes; ask your health care provider or pharmacist if you have questions. COMMON BRAND NAME(S): Ciclodan Nail Solution, CNL8, [place] What should I tell my care team before I take this medication? They need to know if you have any of these conditions: Diabetes (high blood sugar) Immune system problems Organ transplant Receiving steroid inhalers, cream, or lotion Seizures Tingling of the fingers or toes or other nerve disorder An unusual or allergic reaction to ciclopirox, other medications, foods, dyes, or preservatives Pregnant or trying to get pregnant Breast-feeding How should I use this medication? This medication is for external use only. Do not take by mouth. Wash your hands before and after use. If you are treating your hands, only wash your hands before use. Do not get it in your eyes. If you do, rinse your eyes with plenty of cool tap water. Use it as directed on the prescription label at the same time every day. Do not use it more often than directed. Use the medication for the full course as directed by your care team, even if you think you are better. Do not stop using it unless your care team tells you to stop it early. Apply a thin film of the medication to the affected area. Talk to your care team about the use of this medication in children. While it may be prescribed for children as young as 12 years for selected conditions, precautions do apply. Overdosage: If you think you have taken too much of this medicine contact a poison control center or emergency room at once. NOTE: This medicine is only for you. Do not share this medicine with others. What if I miss a dose? If you miss a dose, use it as soon as  you can. If it is almost time for your next dose, use only that dose. Do not use double or extra doses. What may interact with this medication? Interactions are not expected. Do not use any other skin products without telling your care team. This list may not describe all possible interactions. Give your health care provider a list of all the medicines, herbs, non-prescription drugs, or dietary supplements you use. Also tell them if you smoke, drink alcohol, or use illegal drugs. Some items may interact with your medicine. What should I watch for while using this medication? Visit your care team for regular checks on your progress. It may be some time before you see the benefit from this medication. Do not use nail polish or other nail cosmetic products on the treated nails. Removal of the unattached, infected nail by your care team is needed with use of this medication. If you have diabetes or numbness in your fingers or toes, talk to your care team about proper nail care. What side effects may I notice from receiving this medication? Side effects that you should report to your care team as soon as possible: Allergic reactions--skin rash, itching, hives, swelling of the face, lips, tongue, or throat Burning, itching, crusting, or peeling of treated skin Side effects that usually do not require medical attention (report to your care team if they continue or are bothersome): Change in nail shape, thickness, or color Mild skin irritation, redness, or dryness This list may not describe all possible side   effects. Call your doctor for medical advice about side effects. You may report side effects to FDA at 1-800-FDA-1088. Where should I keep my medication? Keep out of the reach of children and pets. Store at room temperature between 20 and 25 degrees C (68 and 77 degrees F). This medication is flammable. Avoid exposure to heat, fire, flame, and smoking. Get rid of medications that are no longer needed  or have expired: Take the medication to a medication take-back program. Check with your pharmacy or law enforcement to find a location. If you cannot return the medication, check the label or package insert to see if the medication should be thrown out in the garbage or flushed down the toilet. If you are not sure, ask your care team. If it is safe to put in the trash, take the medication out of the container. Mix the medication with cat litter, dirt, coffee grounds, or other unwanted substance. Seal the mixture in a bag or container. Put it in the trash. NOTE: This sheet is a summary. It may not cover all possible information. If you have questions about this medicine, talk to your doctor, pharmacist, or health care provider.  2023 Elsevier/Gold Standard (2021-06-27 00:00:00)  

## 2022-12-06 ENCOUNTER — Ambulatory Visit (INDEPENDENT_AMBULATORY_CARE_PROVIDER_SITE_OTHER): Payer: 59 | Admitting: Podiatry

## 2022-12-06 VITALS — BP 150/86 | HR 91

## 2022-12-06 DIAGNOSIS — B351 Tinea unguium: Secondary | ICD-10-CM | POA: Diagnosis not present

## 2022-12-06 DIAGNOSIS — M79674 Pain in right toe(s): Secondary | ICD-10-CM | POA: Diagnosis not present

## 2022-12-06 DIAGNOSIS — M79675 Pain in left toe(s): Secondary | ICD-10-CM

## 2022-12-11 NOTE — Progress Notes (Signed)
Subjective: Chief Complaint  Patient presents with   Nail Problem    Patient reports long, thick, painful toenails that are difficult to trim bilateral.      67 year old male presents the office today for thick, elongated nails he is not able to trim himself.  Still has swelling which is stable.  He has compression socks.  No open lesions that he reports.   Objective: AAO x3, NAD DP/PT pulses palpable bilaterally, CRT less than 3 seconds Some chronic appearing edema present to the right lower extremity.  There is no erythema warmth or signs of infection. No significant swelling present today bilaterally. Nails are hypertrophic, dystrophic, brittle, discolored, elongated 10. No surrounding redness or drainage. Tenderness nails 1-5 bilaterally. No open lesions or pre-ulcerative lesions are identified today. No pain with calf compression, warmth, erythema  Assessment: Symptomatic onychomycosis  Plan: -All treatment options discussed with the patient including all alternatives, risks, complications.  -Sharply debrided nails x 10 without any complications or bleeding.  He has been like. -Encouraged compression, elevation. Chronic swelling to the leg has been stable.  If any changes will repeat venous duplex below suspicion for DVT at this time. -Patient encouraged to call the office with any questions, concerns, change in symptoms.   Edwin [name] DPM

## 2023-03-11 ENCOUNTER — Ambulatory Visit: Payer: 59 | Admitting: Podiatry

## 2023-05-05 ENCOUNTER — Encounter (HOSPITAL_BASED_OUTPATIENT_CLINIC_OR_DEPARTMENT_OTHER): Payer: Self-pay | Admitting: Emergency Medicine

## 2023-05-05 ENCOUNTER — Emergency Department (HOSPITAL_BASED_OUTPATIENT_CLINIC_OR_DEPARTMENT_OTHER)
Admission: EM | Admit: 2023-05-05 | Discharge: 2023-05-05 | Disposition: A | Discharge status: Home / Self Care | Payer: 59 | Attending: Emergency Medicine | Admitting: Emergency Medicine

## 2023-05-05 DIAGNOSIS — Y732 Prosthetic and other implants, materials and accessory gastroenterology and urology devices associated with adverse incidents: Secondary | ICD-10-CM | POA: Insufficient documentation

## 2023-05-05 DIAGNOSIS — T83511A Infection and inflammatory reaction due to indwelling urethral catheter, initial encounter: Secondary | ICD-10-CM | POA: Diagnosis present

## 2023-05-05 DIAGNOSIS — Z7982 Long term (current) use of aspirin: Secondary | ICD-10-CM | POA: Insufficient documentation

## 2023-05-05 DIAGNOSIS — N390 Urinary tract infection, site not specified: Secondary | ICD-10-CM | POA: Insufficient documentation

## 2023-05-05 LAB — URINALYSIS, ROUTINE W REFLEX MICROSCOPIC
Bilirubin Urine: NEGATIVE
Glucose, UA: >1000 mg/dL — AB
Nitrite: NEGATIVE
Protein, ur: 30 mg/dL — AB
Specific Gravity, Urine: 1.012 (ref 1.005–1.030)
WBC, UA: >50 WBC/hpf (ref 0–5)
pH: 6 (ref 5.0–8.0)

## 2023-05-05 MED ORDER — CIPROFLOXACIN HCL 500 MG PO TABS: 500.0000 mg | ORAL_TABLET | Freq: Two times a day (BID) | ORAL | 0 refills | Status: AC

## 2023-05-05 MED ORDER — NYSTATIN 100000 UNIT/GM EX CREA: TOPICAL_CREAM | CUTANEOUS | 0 refills | Status: DC

## 2023-05-05 MED ORDER — NYSTATIN 100000 UNIT/GM EX POWD
CUTANEOUS | Status: AC
  Filled 2023-05-05: qty 15

## 2023-05-05 NOTE — Discharge Instructions (Signed)
You were seen for your urinary retention.  Your Foley catheter was exchanged and you had a urinalysis sent that shows that you may have a UTI.    At home, please take the ciprofloxacin we have prescribed you.    Check your MyChart online for the results of any tests that had not resulted by the time you left the emergency department.   Follow-up with your primary doctor in 2-3 days regarding your visit.  Follow-up with your urologist in 1 to 2 weeks for a checkup as well.  Return immediately to the emergency department if you experience any of the following: Fevers, flank pain, urinary retention, or any other concerning symptoms.    Thank you for visiting our Emergency Department. It was a pleasure taking care of you today.

## 2023-05-05 NOTE — ED Triage Notes (Addendum)
Pt c/o foley catheter leaking starting yesterday. Pt adds that he "feels constipated. Last BM x 2 days pta. Abd distention noted

## 2023-05-05 NOTE — ED Provider Notes (Signed)
[facility] AT [facility] Provider Note   CSN: [id number] Arrival date & time: 05/05/23  1450     History {Add pertinent medical, surgical, social history, OB history to HPI:1} Chief Complaint  Patient presents with   Foley Catheter Problem    Edwin [name] is a 67 y.o. male.  67 year old male with a history of schizophrenia and prostate cancer not currently undergoing treatment with chronic indwelling Foley catheter who presents to the emergency department with concerns for obstructed catheter.  Patient has had an indwelling catheter for approximately a year due to his prostate cancer.  Was changed out 2 weeks ago.  Intermittently will have retention and has not had any urine output since last night so he decided to come into the emergency department for evaluation.  Says his last bowel movement was 2 to 3 days ago.  Is still passing gas.  No significant nausea or vomiting.  Has abdominal distention at baseline.       Home Medications Prior to Admission medications   Medication Sig Start Date End Date Taking? Authorizing Provider  acetaminophen (TYLENOL) 325 MG tablet Take 650 mg by mouth every 6 (six) hours as needed for moderate pain.    [provider]  aspirin EC 81 MG tablet Take 81 mg by mouth daily.    [provider]  benztropine (COGENTIN) 0.5 MG tablet Take 0.5 mg by mouth every morning. 12/13/14   [provider]  bethanechol (URECHOLINE) 5 MG tablet Take 1 tablet (5 mg total) by mouth in the morning and at bedtime. 07/02/22   McKenzie, Mardene Celeste, MD  ciclopirox (PENLAC) 8 % solution Apply topically at bedtime. Apply over nail and surrounding skin. Apply daily over previous coat. After seven (7) days, may remove with alcohol and continue cycle. 09/06/22   Vivi Barrack, DPM  cloZAPine (CLOZARIL) 100 MG tablet Take 400 mg by mouth at bedtime. 11/03/21   [provider]  finasteride (PROSCAR) 5 MG tablet TAKE  1 TABLET BY MOUTH EVERY DAY Patient taking differently: Take 5 mg by mouth daily. 05/17/20   McKenzie, Mardene Celeste, MD  furosemide (LASIX) 20 MG tablet Take 20 mg by mouth daily as needed for fluid or edema. 05/11/22   [provider]  Ipratropium-Albuterol (COMBIVENT RESPIMAT) 20-100 MCG/ACT AERS respimat INHALE 1 PUFF INTO THE LUNGS EVERY 6 HOURS AS NEEDED FOR WHEEZING OR SHORTNESS OF BREATH. 06/04/22   Coralyn Helling, MD  polyethylene glycol (MIRALAX / GLYCOLAX) 17 g packet Take 17 g by mouth daily as needed for moderate constipation. 11/16/21   Meredeth Ide, MD  Potassium 99 MG TABS Take 99 mg by mouth daily.    [provider]  tamsulosin (FLOMAX) 0.4 MG CAPS capsule TAKE 1 CAPSULE BY MOUTH TWICE A DAY 05/24/22   McKenzie, Mardene Celeste, MD  temazepam (RESTORIL) 15 MG capsule Take 15 mg by mouth at bedtime. 06/14/16   [provider]  VRAYLAR 6 MG CAPS Take 6 mg by mouth every morning. 10/24/21   [provider]      Allergies    Patient has no known allergies.    Review of Systems   Review of Systems  Physical Exam Updated Vital Signs BP (!) 141/93 (BP Location: Right Arm)   Pulse (!) 114   Temp 98.2 F (36.8 C) (Oral)   Resp 16   SpO2 93%  Physical Exam Vitals and nursing note reviewed.  Constitutional:      General:  He is not in acute distress.    Appearance: He is well-developed.  HENT:     Head: Normocephalic and atraumatic.     Right Ear: External ear normal.     Left Ear: External ear normal.     Nose: Nose normal.  Eyes:     Extraocular Movements: Extraocular movements intact.     Conjunctiva/sclera: Conjunctivae normal.     Pupils: Pupils are equal, round, and reactive to light.  Pulmonary:     Effort: Pulmonary effort is normal. No respiratory distress.  Abdominal:     General: There is distension.     Palpations: Abdomen is soft. There is no mass.     Tenderness: There is no abdominal tenderness. There is no right CVA tenderness,  left CVA tenderness or guarding.  Genitourinary:    Comments: Indwelling Foley catheter with cloudy urine output.  Small nonpurulent discharge from urethra.  Hypospadias present. Musculoskeletal:     Cervical back: Normal range of motion and neck supple.     Right lower leg: No edema.     Left lower leg: No edema.  Skin:    General: Skin is warm and dry.  Neurological:     Mental Status: He is alert. Mental status is at baseline.  Psychiatric:        Mood and Affect: Mood normal.        Behavior: Behavior normal.     ED Results / Procedures / Treatments   Labs (all labs ordered are listed, but only abnormal results are displayed) Labs Reviewed - No data to display  EKG None  Radiology No results found.  Procedures BLADDER CATHETERIZATION  Date/Time: 05/05/2023 4:54 PM  Performed by: Rondel Baton, MD Authorized by: Rondel Baton, MD   Consent:    Consent obtained:  Verbal   Consent given by:  Patient Universal protocol:    Patient identity confirmed:  Verbally with patient Pre-procedure details:    Procedure purpose:  Therapeutic Anesthesia:    Anesthesia method:  None Procedure details:    Catheter insertion:  Indwelling   Catheter type:  Coude   Catheter size:  18 Fr   Bladder irrigation: no     Number of attempts:  1   Urine characteristics:  Cloudy Post-procedure details:    Procedure completion:  Tolerated well, no immediate complications   {Document cardiac monitor, telemetry assessment procedure when appropriate:1}  Medications Ordered in ED Medications - No data to display  ED Course/ Medical Decision Making/ A&P   {   Click here for ABCD2, HEART and other calculatorsREFRESH Note before signing :1}                              Medical Decision Making Amount and/or Complexity of Data Reviewed Labs: ordered.   ***  {Document critical care time when appropriate:1} {Document review of labs and clinical decision tools ie heart score,  Chads2Vasc2 etc:1}  {Document your independent review of radiology images, and any outside records:1} {Document your discussion with family members, caretakers, and with consultants:1} {Document social determinants of health affecting pt's care:1} {Document your decision making why or why not admission, treatments were needed:1} Final Clinical Impression(s) / ED Diagnoses Final diagnoses:  None    Rx / DC Orders ED Discharge Orders     None

## 2023-05-26 ENCOUNTER — Encounter (HOSPITAL_BASED_OUTPATIENT_CLINIC_OR_DEPARTMENT_OTHER): Payer: Self-pay

## 2023-05-26 ENCOUNTER — Emergency Department (HOSPITAL_BASED_OUTPATIENT_CLINIC_OR_DEPARTMENT_OTHER)
Admission: EM | Admit: 2023-05-26 | Discharge: 2023-05-27 | Disposition: A | Discharge status: Home / Self Care | Payer: 59

## 2023-05-26 ENCOUNTER — Other Ambulatory Visit: Payer: Self-pay

## 2023-05-26 DIAGNOSIS — Z8546 Personal history of malignant neoplasm of prostate: Secondary | ICD-10-CM | POA: Insufficient documentation

## 2023-05-26 DIAGNOSIS — R339 Retention of urine, unspecified: Secondary | ICD-10-CM

## 2023-05-26 DIAGNOSIS — N3001 Acute cystitis with hematuria: Secondary | ICD-10-CM | POA: Diagnosis not present

## 2023-05-26 DIAGNOSIS — N189 Chronic kidney disease, unspecified: Secondary | ICD-10-CM | POA: Diagnosis not present

## 2023-05-26 DIAGNOSIS — R739 Hyperglycemia, unspecified: Secondary | ICD-10-CM | POA: Diagnosis not present

## 2023-05-26 DIAGNOSIS — Z7982 Long term (current) use of aspirin: Secondary | ICD-10-CM | POA: Insufficient documentation

## 2023-05-26 LAB — CBG MONITORING, ED: Glucose-Capillary: 394 mg/dL — ABNORMAL HIGH (ref 70–99)

## 2023-05-26 LAB — BASIC METABOLIC PANEL
Anion gap: 11 (ref 5–15)
BUN: 12 mg/dL (ref 8–23)
CO2: 24 mmol/L (ref 22–32)
Calcium: 9.9 mg/dL (ref 8.9–10.3)
Chloride: 93 mmol/L — ABNORMAL LOW (ref 98–111)
Creatinine, Ser: 1.6 mg/dL — ABNORMAL HIGH (ref 0.61–1.24)
GFR, Estimated: 48 mL/min — ABNORMAL LOW (ref >60)
Glucose, Bld: 546 mg/dL (ref 70–99)
Potassium: 3.8 mmol/L (ref 3.5–5.1)
Sodium: 128 mmol/L — ABNORMAL LOW (ref 135–145)

## 2023-05-26 LAB — CBC WITH DIFFERENTIAL/PLATELET
Abs Immature Granulocytes: 0.14 10*3/uL — ABNORMAL HIGH (ref 0.00–0.07)
Basophils Absolute: 0.1 10*3/uL (ref 0.0–0.1)
Basophils Relative: 0 %
Eosinophils Absolute: 2.2 10*3/uL — ABNORMAL HIGH (ref 0.0–0.5)
Eosinophils Relative: 10 %
HCT: 50.1 % (ref 39.0–52.0)
Hemoglobin: 16.8 g/dL (ref 13.0–17.0)
Immature Granulocytes: 1 %
Lymphocytes Relative: 2 %
Lymphs Abs: 0.4 10*3/uL — ABNORMAL LOW (ref 0.7–4.0)
MCH: 26.5 pg (ref 26.0–34.0)
MCHC: 33.5 g/dL (ref 30.0–36.0)
MCV: 79 fL — ABNORMAL LOW (ref 80.0–100.0)
Monocytes Absolute: 0.9 10*3/uL (ref 0.1–1.0)
Monocytes Relative: 4 %
Neutro Abs: 17.4 10*3/uL — ABNORMAL HIGH (ref 1.7–7.7)
Neutrophils Relative %: 83 %
Platelets: 274 10*3/uL (ref 150–400)
RBC: 6.34 MIL/uL — ABNORMAL HIGH (ref 4.22–5.81)
RDW: 13.3 % (ref 11.5–15.5)
WBC: 21.1 10*3/uL — ABNORMAL HIGH (ref 4.0–10.5)
nRBC: 0 % (ref 0.0–0.2)

## 2023-05-26 LAB — URINALYSIS, W/ REFLEX TO CULTURE (INFECTION SUSPECTED)
Bilirubin Urine: NEGATIVE
Glucose, UA: >1000 mg/dL — AB
Ketones, ur: NEGATIVE mg/dL
Nitrite: NEGATIVE
Protein, ur: NEGATIVE mg/dL
Specific Gravity, Urine: 1.013 (ref 1.005–1.030)
WBC, UA: >50 WBC/hpf (ref 0–5)
pH: 6 (ref 5.0–8.0)

## 2023-05-26 MED ORDER — SODIUM CHLORIDE 0.9 % IV BOLUS
1000.0000 mL | Freq: Once | INTRAVENOUS | Status: AC
  Administered 2023-05-26: 1000 mL via INTRAVENOUS

## 2023-05-26 MED ORDER — SULFAMETHOXAZOLE-TRIMETHOPRIM 800-160 MG PO TABS: 1.0000 | ORAL_TABLET | Freq: Two times a day (BID) | ORAL | 0 refills | Status: AC

## 2023-05-26 MED ORDER — SULFAMETHOXAZOLE-TRIMETHOPRIM 800-160 MG PO TABS
1.0000 | ORAL_TABLET | Freq: Once | ORAL | Status: AC
  Administered 2023-05-26: 1 via ORAL
  Filled 2023-05-26: qty 1

## 2023-05-26 NOTE — ED Notes (Signed)
#  16 cudae catheter inserted by Rosalita Chessman PA and urine sent to lab

## 2023-05-26 NOTE — ED Notes (Signed)
Dr. Maple Hudson aware of blood sugar of 546

## 2023-05-26 NOTE — ED Notes (Signed)
Pt. Left great toe with nail almost off. Pt. States it has been this way couple months.

## 2023-05-26 NOTE — Discharge Instructions (Addendum)
It was a pleasure caring for you today.  As discussed, you will need to follow-up with your primary care provider in the next 48-72 hours for repeat blood work and diabetes management.  Please seek emergency care if experiencing any new or worsening symptoms.

## 2023-05-26 NOTE — ED Triage Notes (Signed)
Pt c/o urinary retention since this AM. Unsure when catheter was placed, but last ED visit pertaining to same was 10/6. Denies abd pain

## 2023-05-26 NOTE — ED Provider Notes (Cosign Needed)
[place] EMERGENCY DEPARTMENT AT [facility] Provider Note   CSN: [id number] Arrival date & time: 05/26/23  1615     History  Chief Complaint  Patient presents with   Urinary Retention    Edwin [name] is a 67 y.o. male with PMHx CKD, GERD, HLD, paranoid schizophrenia, urinary incontinence, prostate cancer, chronic indwelling foley catheter who presents to ED concerned for urinary retention that started earlier today. It appears that patient has had this chronic indwelling foley catheter since 10/2021 for urinary retention. It appears that catheter was last changed 05/05/2023.  Denies fever, chest pain, dyspnea, cough, nausea, vomiting, diarrhea, hematuria.   HPI     Home Medications Prior to Admission medications   Medication Sig Start Date End Date Taking? Authorizing Provider  sulfamethoxazole-trimethoprim (BACTRIM DS) 800-160 MG tablet Take 1 tablet by mouth 2 (two) times daily for 7 days. 05/26/23 06/02/23 Yes Valrie Hart F, PA-C  acetaminophen (TYLENOL) 325 MG tablet Take 650 mg by mouth every 6 (six) hours as needed for moderate pain.    [provider]  aspirin EC 81 MG tablet Take 81 mg by mouth daily.    [provider]  benztropine (COGENTIN) 0.5 MG tablet Take 0.5 mg by mouth every morning. 12/13/14   [provider]  bethanechol (URECHOLINE) 5 MG tablet Take 1 tablet (5 mg total) by mouth in the morning and at bedtime. 07/02/22   McKenzie, Mardene Celeste, MD  ciclopirox (PENLAC) 8 % solution Apply topically at bedtime. Apply over nail and surrounding skin. Apply daily over previous coat. After seven (7) days, may remove with alcohol and continue cycle. 09/06/22   Vivi Barrack, DPM  cloZAPine (CLOZARIL) 100 MG tablet Take 400 mg by mouth at bedtime. 11/03/21   [provider]  finasteride (PROSCAR) 5 MG tablet TAKE 1 TABLET BY MOUTH EVERY DAY Patient taking differently: Take 5 mg by mouth daily. 05/17/20   McKenzie,  Mardene Celeste, MD  furosemide (LASIX) 20 MG tablet Take 20 mg by mouth daily as needed for fluid or edema. 05/11/22   [provider]  Ipratropium-Albuterol (COMBIVENT RESPIMAT) 20-100 MCG/ACT AERS respimat INHALE 1 PUFF INTO THE LUNGS EVERY 6 HOURS AS NEEDED FOR WHEEZING OR SHORTNESS OF BREATH. 06/04/22   Coralyn Helling, MD  nystatin cream (MYCOSTATIN) Apply to affected area 2 times daily 05/05/23   Rondel Baton, MD  polyethylene glycol (MIRALAX / GLYCOLAX) 17 g packet Take 17 g by mouth daily as needed for moderate constipation. 11/16/21   Meredeth Ide, MD  Potassium 99 MG TABS Take 99 mg by mouth daily.    [provider]  tamsulosin (FLOMAX) 0.4 MG CAPS capsule TAKE 1 CAPSULE BY MOUTH TWICE A DAY 05/24/22   McKenzie, Mardene Celeste, MD  temazepam (RESTORIL) 15 MG capsule Take 15 mg by mouth at bedtime. 06/14/16   [provider]  VRAYLAR 6 MG CAPS Take 6 mg by mouth every morning. 10/24/21   [provider]      Allergies    Patient has no known allergies.    Review of Systems   Review of Systems  Genitourinary:  Positive for difficulty urinating.    Physical Exam Updated Vital Signs BP (!) 114/59   Pulse (!) 101   Temp 98.7 F (37.1 C) (Oral)   Resp 16   Ht 5\' 11"  (1.803 m)   Wt 97.1 kg   SpO2 95%   BMI 29.85 kg/m  Physical Exam Vitals and nursing  note reviewed.  Constitutional:      General: He is not in acute distress.    Appearance: He is not ill-appearing or toxic-appearing.  HENT:     Head: Normocephalic and atraumatic.     Mouth/Throat:     Mouth: Mucous membranes are moist.  Eyes:     General: No scleral icterus.       Right eye: No discharge.        Left eye: No discharge.     Conjunctiva/sclera: Conjunctivae normal.  Cardiovascular:     Rate and Rhythm: Normal rate and regular rhythm.     Pulses: Normal pulses.     Heart sounds: Normal heart sounds. No murmur heard. Pulmonary:     Effort: Pulmonary effort is normal.   Abdominal:     General: Abdomen is flat. Bowel sounds are normal. There is no distension.     Palpations: Abdomen is soft. There is no mass.     Tenderness: There is abdominal tenderness.     Comments: Mild suprapubic tenderness to palpation  Musculoskeletal:     Right lower leg: No edema.     Left lower leg: No edema.  Skin:    General: Skin is warm and dry.     Findings: No rash.  Neurological:     General: No focal deficit present.     Mental Status: He is alert. Mental status is at baseline.  Psychiatric:        Mood and Affect: Mood normal.     ED Results / Procedures / Treatments   Labs (all labs ordered are listed, but only abnormal results are displayed) Labs Reviewed  CBC WITH DIFFERENTIAL/PLATELET - Abnormal; Notable for the following components:      Result Value   WBC 21.1 (*)    RBC 6.34 (*)    MCV 79.0 (*)    Neutro Abs 17.4 (*)    Lymphs Abs 0.4 (*)    Eosinophils Absolute 2.2 (*)    Abs Immature Granulocytes 0.14 (*)    All other components within normal limits  BASIC METABOLIC PANEL - Abnormal; Notable for the following components:   Sodium 128 (*)    Chloride 93 (*)    Glucose, Bld 546 (*)    Creatinine, Ser 1.60 (*)    GFR, Estimated 48 (*)    All other components within normal limits  URINALYSIS, W/ REFLEX TO CULTURE (INFECTION SUSPECTED) - Abnormal; Notable for the following components:   APPearance HAZY (*)    Glucose, UA >1,000 (*)    Hgb urine dipstick LARGE (*)    Leukocytes,Ua LARGE (*)    Bacteria, UA RARE (*)    All other components within normal limits  CBG MONITORING, ED - Abnormal; Notable for the following components:   Glucose-Capillary 394 (*)    All other components within normal limits  URINE CULTURE    EKG None  Radiology No results found.  Procedures BLADDER CATHETERIZATION  Date/Time: 05/26/2023 11:22 PM  Performed by: Dorthy Cooler, PA-C Authorized by: Dorthy Cooler, PA-C   Consent:    Consent  obtained:  Verbal   Consent given by:  Patient   Risks, benefits, and alternatives were discussed: yes   Universal protocol:    Patient identity confirmed:  Verbally with patient Pre-procedure details:    Procedure purpose:  Therapeutic Anesthesia:    Anesthesia method:  None Procedure details:    Provider performed due to:  Complicated insertion   Catheter insertion:  Indwelling   Catheter type:  Coude   Catheter size:  16 Fr   Bladder irrigation: yes     Number of attempts:  1   Urine characteristics:  Clear and yellow Post-procedure details:    Procedure completion:  Tolerated well, no immediate complications     Medications Ordered in ED Medications  sodium chloride 0.9 % bolus 1,000 mL (0 mLs Intravenous Stopped 05/26/23 2229)  sodium chloride 0.9 % bolus 1,000 mL (1,000 mLs Intravenous New Bag/Given 05/26/23 2231)    ED Course/ Medical Decision Making/ A&P                                 Medical Decision Making Amount and/or Complexity of Data Reviewed Labs: ordered.  Risk Prescription drug management.   This patient presents to the ED for concern of urinary retention, this involves an extensive number of treatment options, and is a complaint that carries with it a high risk of complications and morbidity.  The differential diagnosis includes outflow obstruction, infection, medication adverse reaction, neurologic impairment   Co morbidities that complicate the patient evaluation  Chronic indwelling urinary catheter, prostate cancer   Additional history obtained:  Additional history obtained from 05/08/2023 ED note: patient seen for urinary retention. Foley catheter changed out successfully. Urine concerning for UTI. Patient treated with Cipro.   Lab Tests:  I Ordered, and personally interpreted labs.  The pertinent results include:   -CBC: leukocytosis at 21.1 -BMP: hyperglycemia at 546; Cr at baseline around 1.60; hyponatremia at 128 and low chloride at  93 -UA: Large leukocytes, rare bacteria, and >50 WBC   Problem List / ED Course / Critical interventions / Medication management  Patient presents to ED concerned for urinary retention since earlier today. Physical exam with some mild suprapubic tenderness to palpation that resolved after foley catheter was changed.  Bladder scan prior to catheter change showed >954ml urine.  I was able to insert a 75F coud catheter which immediately output around 2 L of fluid. Monitored patient in ED for many hours incase of complication from this extreme urinary retention - patient doing well. Patient with tachycardia that started resolving after IV fluids. Rest of physical exam reassuring. Patient ambulating well in ED without concern. UA showing large leukocytes, rare bacteria, and >50WBC. No urine culture obtained earlier this month when patient was in ED. Past urine cultures sensitive to bactrim which I will prescribe for patient today. Gave him one dose in ED which he tolerated well. CBC with leukocytosis at 21.1 which I believe is due to patient's UTI and abdominal pain associated with urinary retention.  Shared with patient that he need to follow-up with his PCP.  Patient verbalized understanding of plan. BMP with hyperglycemia at 546 without anion gap.  There is also hyponatremia at 128 and low chloride at 93 which I believe is due to his high blood sugar level.  Patient does have mild tachycardia in the ED which seems to be more related to his hyperglycemia rather than infectious causes since he is afebrile and overall well-appearing. Provided patient with IV fluids which decreased his blood sugar to 394. Will provide patient with further IV fluids.  Creatinine is also elevated at 1.6 which is near patient's baseline. Talk to patient who stated that he was feeling better.  Patient stating that he is ready to go home.  Recommended following up with PCP given leukocytosis and increased blood  sugar.  Patient  verbalized understanding of plan. I have reviewed the patients home medicines and have made adjustments as needed Patient afebrile with stable vitals.  Provided with return precautions.  Discharged in good condition.    Social Determinants of Health:  none           Final Clinical Impression(s) / ED Diagnoses Final diagnoses:  Urinary retention  Hyperglycemia  Acute cystitis with hematuria    Rx / DC Orders ED Discharge Orders          Ordered    sulfamethoxazole-trimethoprim (BACTRIM DS) 800-160 MG tablet  2 times daily        05/26/23 2337                Dorthy Cooler, [place] 05/27/23 0007    Coral Spikes, DO 05/27/23 2347

## 2023-05-27 NOTE — ED Provider Notes (Incomplete)
[place] EMERGENCY DEPARTMENT AT [facility] Provider Note   CSN: [id number] Arrival date & time: 05/26/23  1615     History  Chief Complaint  Patient presents with  . Urinary Retention    Edwin [name] is a 67 y.o. male with PMHx CKD, GERD, HLD, paranoid schizophrenia, urinary incontinence, prostate cancer, chronic indwelling foley catheter who presents to ED concerned for urinary retention that started earlier today. It appears that patient has had this chronic indwelling foley catheter since 10/2021 for urinary retention. It appears that catheter was last changed 05/05/2023.  Denies fever, chest pain, dyspnea, cough, nausea, vomiting, diarrhea, hematuria.   HPI     Home Medications Prior to Admission medications   Medication Sig Start Date End Date Taking? Authorizing Provider  sulfamethoxazole-trimethoprim (BACTRIM DS) 800-160 MG tablet Take 1 tablet by mouth 2 (two) times daily for 7 days. 05/26/23 06/02/23 Yes Valrie Hart F, PA-C  acetaminophen (TYLENOL) 325 MG tablet Take 650 mg by mouth every 6 (six) hours as needed for moderate pain.    [provider]  aspirin EC 81 MG tablet Take 81 mg by mouth daily.    [provider]  benztropine (COGENTIN) 0.5 MG tablet Take 0.5 mg by mouth every morning. 12/13/14   [provider]  bethanechol (URECHOLINE) 5 MG tablet Take 1 tablet (5 mg total) by mouth in the morning and at bedtime. 07/02/22   McKenzie, Mardene Celeste, MD  ciclopirox (PENLAC) 8 % solution Apply topically at bedtime. Apply over nail and surrounding skin. Apply daily over previous coat. After seven (7) days, may remove with alcohol and continue cycle. 09/06/22   Vivi Barrack, DPM  cloZAPine (CLOZARIL) 100 MG tablet Take 400 mg by mouth at bedtime. 11/03/21   [provider]  finasteride (PROSCAR) 5 MG tablet TAKE 1 TABLET BY MOUTH EVERY DAY Patient taking differently: Take 5 mg by mouth daily. 05/17/20   McKenzie,  Mardene Celeste, MD  furosemide (LASIX) 20 MG tablet Take 20 mg by mouth daily as needed for fluid or edema. 05/11/22   [provider]  Ipratropium-Albuterol (COMBIVENT RESPIMAT) 20-100 MCG/ACT AERS respimat INHALE 1 PUFF INTO THE LUNGS EVERY 6 HOURS AS NEEDED FOR WHEEZING OR SHORTNESS OF BREATH. 06/04/22   Coralyn Helling, MD  nystatin cream (MYCOSTATIN) Apply to affected area 2 times daily 05/05/23   Rondel Baton, MD  polyethylene glycol (MIRALAX / GLYCOLAX) 17 g packet Take 17 g by mouth daily as needed for moderate constipation. 11/16/21   Meredeth Ide, MD  Potassium 99 MG TABS Take 99 mg by mouth daily.    [provider]  tamsulosin (FLOMAX) 0.4 MG CAPS capsule TAKE 1 CAPSULE BY MOUTH TWICE A DAY 05/24/22   McKenzie, Mardene Celeste, MD  temazepam (RESTORIL) 15 MG capsule Take 15 mg by mouth at bedtime. 06/14/16   [provider]  VRAYLAR 6 MG CAPS Take 6 mg by mouth every morning. 10/24/21   [provider]      Allergies    Patient has no known allergies.    Review of Systems   Review of Systems  Genitourinary:  Positive for difficulty urinating.    Physical Exam Updated Vital Signs BP (!) 114/59   Pulse (!) 101   Temp 98.7 F (37.1 C) (Oral)   Resp 16   Ht 5\' 11"  (1.803 m)   Wt 97.1 kg   SpO2 95%   BMI 29.85 kg/m  Physical Exam Vitals and nursing  note reviewed.  Constitutional:      General: He is not in acute distress.    Appearance: He is not ill-appearing or toxic-appearing.  HENT:     Head: Normocephalic and atraumatic.     Mouth/Throat:     Mouth: Mucous membranes are moist.  Eyes:     General: No scleral icterus.       Right eye: No discharge.        Left eye: No discharge.     Conjunctiva/sclera: Conjunctivae normal.  Cardiovascular:     Rate and Rhythm: Normal rate and regular rhythm.     Pulses: Normal pulses.     Heart sounds: Normal heart sounds. No murmur heard. Pulmonary:     Effort: Pulmonary effort is normal.   Abdominal:     General: Abdomen is flat. Bowel sounds are normal. There is no distension.     Palpations: Abdomen is soft. There is no mass.     Tenderness: There is abdominal tenderness.     Comments: Mild suprapubic tenderness to palpation  Musculoskeletal:     Right lower leg: No edema.     Left lower leg: No edema.  Skin:    General: Skin is warm and dry.     Findings: No rash.  Neurological:     General: No focal deficit present.     Mental Status: He is alert. Mental status is at baseline.  Psychiatric:        Mood and Affect: Mood normal.     ED Results / Procedures / Treatments   Labs (all labs ordered are listed, but only abnormal results are displayed) Labs Reviewed  CBC WITH DIFFERENTIAL/PLATELET - Abnormal; Notable for the following components:      Result Value   WBC 21.1 (*)    RBC 6.34 (*)    MCV 79.0 (*)    Neutro Abs 17.4 (*)    Lymphs Abs 0.4 (*)    Eosinophils Absolute 2.2 (*)    Abs Immature Granulocytes 0.14 (*)    All other components within normal limits  BASIC METABOLIC PANEL - Abnormal; Notable for the following components:   Sodium 128 (*)    Chloride 93 (*)    Glucose, Bld 546 (*)    Creatinine, Ser 1.60 (*)    GFR, Estimated 48 (*)    All other components within normal limits  URINALYSIS, W/ REFLEX TO CULTURE (INFECTION SUSPECTED) - Abnormal; Notable for the following components:   APPearance HAZY (*)    Glucose, UA >1,000 (*)    Hgb urine dipstick LARGE (*)    Leukocytes,Ua LARGE (*)    Bacteria, UA RARE (*)    All other components within normal limits  CBG MONITORING, ED - Abnormal; Notable for the following components:   Glucose-Capillary 394 (*)    All other components within normal limits  URINE CULTURE    EKG None  Radiology No results found.  Procedures BLADDER CATHETERIZATION  Date/Time: 05/26/2023 11:22 PM  Performed by: Dorthy Cooler, PA-C Authorized by: Dorthy Cooler, PA-C   Consent:    Consent  obtained:  Verbal   Consent given by:  Patient   Risks, benefits, and alternatives were discussed: yes   Universal protocol:    Patient identity confirmed:  Verbally with patient Pre-procedure details:    Procedure purpose:  Therapeutic Anesthesia:    Anesthesia method:  None Procedure details:    Provider performed due to:  Complicated insertion   Catheter insertion:  Indwelling   Catheter type:  Coude   Catheter size:  16 Fr   Bladder irrigation: yes     Number of attempts:  1   Urine characteristics:  Clear and yellow Post-procedure details:    Procedure completion:  Tolerated well, no immediate complications     Medications Ordered in ED Medications  sodium chloride 0.9 % bolus 1,000 mL (0 mLs Intravenous Stopped 05/26/23 2229)  sodium chloride 0.9 % bolus 1,000 mL (1,000 mLs Intravenous New Bag/Given 05/26/23 2231)    ED Course/ Medical Decision Making/ A&P                                 Medical Decision Making Amount and/or Complexity of Data Reviewed Labs: ordered.  Risk Prescription drug management.   This patient presents to the ED for concern of urinary retention, this involves an extensive number of treatment options, and is a complaint that carries with it a high risk of complications and morbidity.  The differential diagnosis includes outflow obstruction, infection, medication adverse reaction, neurologic impairment   Co morbidities that complicate the patient evaluation  Chronic indwelling urinary catheter, prostate cancer   Additional history obtained:  Additional history obtained from 05/08/2023 ED note: patient seen for urinary retention. Foley catheter changed out successfully. Urine concerning for UTI. Patient treated with Cipro.   Lab Tests:  I Ordered, and personally interpreted labs.  The pertinent results include:   -CBC: leukocytosis at 21.1 -BMP: hyperglycemia at 546; Cr at baseline around 1.60; hyponatremia at 128 and low chloride at  93 -UA: Large leukocytes, rare bacteria, and >50 WBC   Problem List / ED Course / Critical interventions / Medication management  Patient presents to ED concerned for urinary retention since earlier today. Physical exam with some mild suprapubic tenderness to palpation that resolved after foley catheter was changed.  Bladder scan prior to catheter change showed >915ml urine.  I was able to insert a 57F coud catheter which immediately output around 2 L of fluid. Monitored patient in ED for many hours incase of complication from this extreme urinary retention - patient doing well. Patient with tachycardia that started resolving after IV fluids.  Rest of physical exam reassuring. Patient ambulating well in ED without concern. UA showing large leukocytes, rare bacteria, and >50WBC. No urine culture obtained earlier this month when patient was in ED. Past urine cultures sensitive to bactrim which I will prescribe for patient today. Gave him one dose in ED which he tolerated well. CBC with leukocytosis at 21.1 which I believe is due to patient's UTI and abdominal pain associated with urinary retention.  Shared with patient that he need to follow-up with his PCP.  Patient verbalized understanding of plan. BMP with hyperglycemia at 546 without anion gap.  There is also hyponatremia at 128 and low chloride at 93 which I believe is due to his high blood sugar level.  Provided patient with IV fluids which decreased his blood sugar to 394. Will provide patient with further IV fluids.  Creatinine is also elevated at 1.6 which is near patient's baseline. Talk to patient who stated that he was feeling better.  Patient stating that he is ready to go home.  Recommended following up with PCP given leukocytosis and increased blood sugar.  Patient verbalized understanding of plan. I have reviewed the patients home medicines and have made adjustments as needed Patient afebrile with stable vitals.  Provided  with return  precautions.  Discharged in good condition.    Social Determinants of Health:  none           Final Clinical Impression(s) / ED Diagnoses Final diagnoses:  Urinary retention  Hyperglycemia  Acute cystitis with hematuria    Rx / DC Orders ED Discharge Orders          Ordered    sulfamethoxazole-trimethoprim (BACTRIM DS) 800-160 MG tablet  2 times daily        05/26/23 2337              Dorthy Cooler, [place] 05/26/23 2341

## 2023-05-29 LAB — URINE CULTURE: Culture: 20000 — AB

## 2023-05-30 ENCOUNTER — Telehealth (HOSPITAL_BASED_OUTPATIENT_CLINIC_OR_DEPARTMENT_OTHER): Payer: Self-pay

## 2023-05-30 NOTE — Telephone Encounter (Signed)
Post ED Visit - Positive Culture Follow-up  Culture report reviewed by antimicrobial stewardship pharmacist: [facility] []  Enzo Bi, Pharm.D. []  Celedonio Miyamoto, Pharm.D., BCPS AQ-ID [x]  Wilburn Cornelia, Pharm.D., BCPS []  Georgina Pillion, Pharm.D., BCPS []  [place], [address].D., BCPS, AAHIVP []  Estella Husk, Pharm.D., BCPS, AAHIVP []  Lysle Pearl, PharmD, BCPS []  Phillips Climes, PharmD, BCPS []  Agapito Games, PharmD, BCPS []  Verlan Friends, PharmD []  Mervyn Gay, PharmD, BCPS []  Vinnie Level, PharmD  [facility] []  Len Childs, PharmD []  Greer Pickerel, PharmD []  Adalberto Cole, PharmD []  Perlie Gold, Rph []  Lonell Face) Jean Rosenthal, PharmD []  Earl Many, PharmD []  Junita Push, PharmD []  Dorna Leitz, PharmD []  Terrilee Files, PharmD []  Lynann Beaver, PharmD []  Keturah Barre, PharmD []  Loralee Pacas, PharmD []  Bernadene Person, PharmD   Positive urine culture Treated with Sulfamethoxazole, organism sensitive to the same and no further patient follow-up is required at this time.  Sandria Senter 05/30/2023, 2:04 PM

## 2023-06-01 ENCOUNTER — Other Ambulatory Visit: Payer: Self-pay | Admitting: Urology

## 2023-06-11 ENCOUNTER — Emergency Department (HOSPITAL_COMMUNITY): Payer: 59

## 2023-06-11 ENCOUNTER — Other Ambulatory Visit: Payer: Self-pay

## 2023-06-11 ENCOUNTER — Observation Stay (HOSPITAL_COMMUNITY)
Admission: EM | Admit: 2023-06-11 | Discharge: 2023-06-13 | Disposition: A | Discharge status: Home / Self Care | Payer: 59 | Attending: Internal Medicine | Admitting: Internal Medicine

## 2023-06-11 ENCOUNTER — Encounter (HOSPITAL_COMMUNITY): Payer: Self-pay

## 2023-06-11 DIAGNOSIS — Z7901 Long term (current) use of anticoagulants: Secondary | ICD-10-CM | POA: Diagnosis not present

## 2023-06-11 DIAGNOSIS — Z9181 History of falling: Secondary | ICD-10-CM | POA: Diagnosis not present

## 2023-06-11 DIAGNOSIS — Z79899 Other long term (current) drug therapy: Secondary | ICD-10-CM | POA: Insufficient documentation

## 2023-06-11 DIAGNOSIS — F419 Anxiety disorder, unspecified: Secondary | ICD-10-CM | POA: Insufficient documentation

## 2023-06-11 DIAGNOSIS — N1831 Chronic kidney disease, stage 3a: Secondary | ICD-10-CM | POA: Insufficient documentation

## 2023-06-11 DIAGNOSIS — F32A Depression, unspecified: Secondary | ICD-10-CM | POA: Diagnosis not present

## 2023-06-11 DIAGNOSIS — F17210 Nicotine dependence, cigarettes, uncomplicated: Secondary | ICD-10-CM | POA: Diagnosis not present

## 2023-06-11 DIAGNOSIS — R531 Weakness: Secondary | ICD-10-CM | POA: Diagnosis present

## 2023-06-11 DIAGNOSIS — F17200 Nicotine dependence, unspecified, uncomplicated: Secondary | ICD-10-CM

## 2023-06-11 DIAGNOSIS — K219 Gastro-esophageal reflux disease without esophagitis: Secondary | ICD-10-CM | POA: Insufficient documentation

## 2023-06-11 DIAGNOSIS — E1122 Type 2 diabetes mellitus with diabetic chronic kidney disease: Secondary | ICD-10-CM | POA: Insufficient documentation

## 2023-06-11 DIAGNOSIS — Z8546 Personal history of malignant neoplasm of prostate: Secondary | ICD-10-CM | POA: Insufficient documentation

## 2023-06-11 DIAGNOSIS — E876 Hypokalemia: Secondary | ICD-10-CM

## 2023-06-11 DIAGNOSIS — E11649 Type 2 diabetes mellitus with hypoglycemia without coma: Secondary | ICD-10-CM | POA: Diagnosis not present

## 2023-06-11 DIAGNOSIS — E162 Hypoglycemia, unspecified: Secondary | ICD-10-CM

## 2023-06-11 DIAGNOSIS — W19XXXA Unspecified fall, initial encounter: Secondary | ICD-10-CM

## 2023-06-11 DIAGNOSIS — E1165 Type 2 diabetes mellitus with hyperglycemia: Secondary | ICD-10-CM | POA: Diagnosis not present

## 2023-06-11 DIAGNOSIS — R2689 Other abnormalities of gait and mobility: Secondary | ICD-10-CM | POA: Insufficient documentation

## 2023-06-11 DIAGNOSIS — F209 Schizophrenia, unspecified: Secondary | ICD-10-CM | POA: Diagnosis not present

## 2023-06-11 DIAGNOSIS — Z7984 Long term (current) use of oral hypoglycemic drugs: Secondary | ICD-10-CM | POA: Diagnosis not present

## 2023-06-11 DIAGNOSIS — Z794 Long term (current) use of insulin: Secondary | ICD-10-CM | POA: Insufficient documentation

## 2023-06-11 DIAGNOSIS — Y92009 Unspecified place in unspecified non-institutional (private) residence as the place of occurrence of the external cause: Secondary | ICD-10-CM

## 2023-06-11 DIAGNOSIS — E118 Type 2 diabetes mellitus with unspecified complications: Secondary | ICD-10-CM

## 2023-06-11 LAB — CBC WITH DIFFERENTIAL/PLATELET
Abs Immature Granulocytes: 0.06 10*3/uL (ref 0.00–0.07)
Basophils Absolute: 0.1 10*3/uL (ref 0.0–0.1)
Basophils Relative: 0 %
Eosinophils Absolute: 0 10*3/uL (ref 0.0–0.5)
Eosinophils Relative: 0 %
HCT: 51 % (ref 39.0–52.0)
Hemoglobin: 16.7 g/dL (ref 13.0–17.0)
Immature Granulocytes: 0 %
Lymphocytes Relative: 8 %
Lymphs Abs: 1 10*3/uL (ref 0.7–4.0)
MCH: 26.8 pg (ref 26.0–34.0)
MCHC: 32.7 g/dL (ref 30.0–36.0)
MCV: 82 fL (ref 80.0–100.0)
Monocytes Absolute: 0.8 10*3/uL (ref 0.1–1.0)
Monocytes Relative: 6 %
Neutro Abs: 11.4 10*3/uL — ABNORMAL HIGH (ref 1.7–7.7)
Neutrophils Relative %: 86 %
Platelets: 280 10*3/uL (ref 150–400)
RBC: 6.22 MIL/uL — ABNORMAL HIGH (ref 4.22–5.81)
RDW: 15 % (ref 11.5–15.5)
WBC: 13.4 10*3/uL — ABNORMAL HIGH (ref 4.0–10.5)
nRBC: 0 % (ref 0.0–0.2)

## 2023-06-11 LAB — PHOSPHORUS: Phosphorus: 2.9 mg/dL (ref 2.5–4.6)

## 2023-06-11 LAB — URINALYSIS, W/ REFLEX TO CULTURE (INFECTION SUSPECTED)
Bilirubin Urine: NEGATIVE
Glucose, UA: NEGATIVE mg/dL
Ketones, ur: NEGATIVE mg/dL
Nitrite: NEGATIVE
Protein, ur: NEGATIVE mg/dL
Specific Gravity, Urine: 1.002 — ABNORMAL LOW (ref 1.005–1.030)
pH: 6 (ref 5.0–8.0)

## 2023-06-11 LAB — CBG MONITORING, ED
Glucose-Capillary: 88 mg/dL (ref 70–99)
Glucose-Capillary: 100 mg/dL — ABNORMAL HIGH (ref 70–99)

## 2023-06-11 LAB — COMPREHENSIVE METABOLIC PANEL
ALT: 21 U/L (ref 0–44)
AST: 26 U/L (ref 15–41)
Albumin: 4.1 g/dL (ref 3.5–5.0)
Alkaline Phosphatase: 74 U/L (ref 38–126)
Anion gap: 10 (ref 5–15)
BUN: 12 mg/dL (ref 8–23)
CO2: 23 mmol/L (ref 22–32)
Calcium: 9.2 mg/dL (ref 8.9–10.3)
Chloride: 100 mmol/L (ref 98–111)
Creatinine, Ser: 1.39 mg/dL — ABNORMAL HIGH (ref 0.61–1.24)
GFR, Estimated: 57 mL/min — ABNORMAL LOW (ref >60)
Glucose, Bld: 45 mg/dL — ABNORMAL LOW (ref 70–99)
Potassium: 2.6 mmol/L — CL (ref 3.5–5.1)
Sodium: 133 mmol/L — ABNORMAL LOW (ref 135–145)
Total Bilirubin: 0.9 mg/dL (ref <1.2)
Total Protein: 7.3 g/dL (ref 6.5–8.1)

## 2023-06-11 LAB — AMMONIA: Ammonia: 22 umol/L (ref 9–35)

## 2023-06-11 LAB — CK: Total CK: 630 U/L — ABNORMAL HIGH (ref 49–397)

## 2023-06-11 LAB — LIPASE, BLOOD: Lipase: 39 U/L (ref 11–51)

## 2023-06-11 LAB — TROPONIN I (HIGH SENSITIVITY)
Troponin I (High Sensitivity): 9 ng/L (ref <18)
Troponin I (High Sensitivity): 9 ng/L (ref <18)

## 2023-06-11 LAB — ETHANOL: Alcohol, Ethyl (B): <10 mg/dL (ref <10)

## 2023-06-11 LAB — BRAIN NATRIURETIC PEPTIDE: B Natriuretic Peptide: 65.4 pg/mL (ref 0.0–100.0)

## 2023-06-11 LAB — GLUCOSE, CAPILLARY
Glucose-Capillary: 45 mg/dL — ABNORMAL LOW (ref 70–99)
Glucose-Capillary: 66 mg/dL — ABNORMAL LOW (ref 70–99)

## 2023-06-11 LAB — PROTIME-INR
INR: 1 (ref 0.8–1.2)
Prothrombin Time: 12.9 s (ref 11.4–15.2)

## 2023-06-11 LAB — MAGNESIUM: Magnesium: 2.3 mg/dL (ref 1.7–2.4)

## 2023-06-11 MED ORDER — OXYBUTYNIN CHLORIDE 5 MG PO TABS
5.0000 mg | ORAL_TABLET | Freq: Every day | ORAL | Status: DC
  Administered 2023-06-12 – 2023-06-13 (×2): 5 mg via ORAL
  Filled 2023-06-11 (×2): qty 1

## 2023-06-11 MED ORDER — CARIPRAZINE HCL 1.5 MG PO CAPS
6.0000 mg | ORAL_CAPSULE | Freq: Every morning | ORAL | Status: DC
  Administered 2023-06-12 – 2023-06-13 (×2): 6 mg via ORAL
  Filled 2023-06-11 (×2): qty 4

## 2023-06-11 MED ORDER — ONDANSETRON HCL 4 MG/2ML IJ SOLN: 4.0000 mg | Freq: Four times a day (QID) | INTRAMUSCULAR | Status: DC | PRN

## 2023-06-11 MED ORDER — IPRATROPIUM-ALBUTEROL 0.5-2.5 (3) MG/3ML IN SOLN: 3.0000 mL | Freq: Four times a day (QID) | RESPIRATORY_TRACT | Status: DC | PRN

## 2023-06-11 MED ORDER — BENZTROPINE MESYLATE 0.5 MG PO TABS
0.5000 mg | ORAL_TABLET | Freq: Every morning | ORAL | Status: DC
  Administered 2023-06-12 – 2023-06-13 (×2): 0.5 mg via ORAL
  Filled 2023-06-11 (×2): qty 1

## 2023-06-11 MED ORDER — IPRATROPIUM-ALBUTEROL 20-100 MCG/ACT IN AERS: 1.0000 | INHALATION_SPRAY | Freq: Four times a day (QID) | RESPIRATORY_TRACT | Status: DC | PRN

## 2023-06-11 MED ORDER — ACETAMINOPHEN 650 MG RE SUPP: 650.0000 mg | Freq: Four times a day (QID) | RECTAL | Status: DC | PRN

## 2023-06-11 MED ORDER — ASPIRIN 81 MG PO TBEC
81.0000 mg | DELAYED_RELEASE_TABLET | Freq: Every day | ORAL | Status: DC
  Administered 2023-06-12 – 2023-06-13 (×2): 81 mg via ORAL
  Filled 2023-06-11 (×2): qty 1

## 2023-06-11 MED ORDER — POTASSIUM CHLORIDE 10 MEQ/100ML IV SOLN
10.0000 meq | INTRAVENOUS | Status: AC
  Administered 2023-06-11 (×2): 10 meq via INTRAVENOUS
  Filled 2023-06-11 (×2): qty 100

## 2023-06-11 MED ORDER — SENNOSIDES-DOCUSATE SODIUM 8.6-50 MG PO TABS
1.0000 | ORAL_TABLET | Freq: Every evening | ORAL | Status: DC | PRN
  Administered 2023-06-12: 1 via ORAL
  Filled 2023-06-11: qty 1

## 2023-06-11 MED ORDER — BETHANECHOL CHLORIDE 10 MG PO TABS
5.0000 mg | ORAL_TABLET | Freq: Two times a day (BID) | ORAL | Status: DC
  Administered 2023-06-11 – 2023-06-13 (×4): 5 mg via ORAL
  Filled 2023-06-11 (×7): qty 0.5

## 2023-06-11 MED ORDER — DEXTROSE-SODIUM CHLORIDE 5-0.45 % IV SOLN: INTRAVENOUS | Status: AC

## 2023-06-11 MED ORDER — ONDANSETRON HCL 4 MG PO TABS: 4.0000 mg | ORAL_TABLET | Freq: Four times a day (QID) | ORAL | Status: DC | PRN

## 2023-06-11 MED ORDER — TAMSULOSIN HCL 0.4 MG PO CAPS
0.4000 mg | ORAL_CAPSULE | Freq: Two times a day (BID) | ORAL | Status: DC
  Administered 2023-06-11 – 2023-06-13 (×4): 0.4 mg via ORAL
  Filled 2023-06-11 (×4): qty 1

## 2023-06-11 MED ORDER — LUMATEPERONE TOSYLATE 42 MG PO CAPS
42.0000 mg | ORAL_CAPSULE | Freq: Every day | ORAL | Status: DC
  Administered 2023-06-11 – 2023-06-12 (×2): 42 mg via ORAL
  Filled 2023-06-11 (×2): qty 1

## 2023-06-11 MED ORDER — TEMAZEPAM 15 MG PO CAPS
15.0000 mg | ORAL_CAPSULE | Freq: Every day | ORAL | Status: DC
  Administered 2023-06-11 – 2023-06-12 (×2): 15 mg via ORAL
  Filled 2023-06-11 (×2): qty 1

## 2023-06-11 MED ORDER — DEXTROSE 50 % IV SOLN
1.0000 | Freq: Once | INTRAVENOUS | Status: AC
  Administered 2023-06-11: 50 mL via INTRAVENOUS
  Filled 2023-06-11: qty 50

## 2023-06-11 MED ORDER — FINASTERIDE 5 MG PO TABS
5.0000 mg | ORAL_TABLET | Freq: Every day | ORAL | Status: DC
  Administered 2023-06-11 – 2023-06-13 (×3): 5 mg via ORAL
  Filled 2023-06-11 (×3): qty 1

## 2023-06-11 MED ORDER — ENOXAPARIN SODIUM 40 MG/0.4ML IJ SOSY
40.0000 mg | PREFILLED_SYRINGE | INTRAMUSCULAR | Status: DC
  Administered 2023-06-11 – 2023-06-12 (×2): 40 mg via SUBCUTANEOUS
  Filled 2023-06-11 (×2): qty 0.4

## 2023-06-11 MED ORDER — ENSURE ENLIVE PO LIQD
237.0000 mL | Freq: Two times a day (BID) | ORAL | Status: DC
  Administered 2023-06-12 – 2023-06-13 (×4): 237 mL via ORAL

## 2023-06-11 MED ORDER — ACETAMINOPHEN 325 MG PO TABS
650.0000 mg | ORAL_TABLET | Freq: Four times a day (QID) | ORAL | Status: DC | PRN
  Administered 2023-06-12 (×2): 650 mg via ORAL
  Filled 2023-06-11 (×2): qty 2

## 2023-06-11 MED ORDER — POTASSIUM CHLORIDE CRYS ER 20 MEQ PO TBCR
40.0000 meq | EXTENDED_RELEASE_TABLET | Freq: Once | ORAL | Status: AC
  Administered 2023-06-11: 40 meq via ORAL
  Filled 2023-06-11: qty 2

## 2023-06-11 MED ORDER — POTASSIUM CHLORIDE 10 MEQ/100ML IV SOLN
10.0000 meq | INTRAVENOUS | Status: AC
  Administered 2023-06-11 – 2023-06-12 (×3): 10 meq via INTRAVENOUS
  Filled 2023-06-11 (×3): qty 100

## 2023-06-11 NOTE — ED Provider Notes (Signed)
[place] [facility]ERGENCY DEPARTMENT AT [facility] Provider Note   CSN: [id number] Arrival date & time: 06/11/23  1541     History  Chief Complaint  Patient presents with   Fall   Hypoglycemia    Edwin [name] is a 67 y.o. male.  67 year old male with prior medical history as detailed below presents from home with EMS transport.  The patient's niece called EMS after the patient fell and was able to get up.  Patient reports that he did strike his head.  He does not think he passed out.  He has a cervical collar placed by EMS.  He denies neck pain.  On scene, patient is blood sugar was 40 per EMS.  Patient was given 2 g of D10 by EMS.  EMS reported improved glucose to 180.  Patient reports decreased p.o. intake x 3 to 4 days.  Patient with chronic indwelling catheter in place.  The history is provided by the patient and medical records.       Home Medications Prior to Admission medications   Medication Sig Start Date End Date Taking? Authorizing Provider  acetaminophen (TYLENOL) 325 MG tablet Take 650 mg by mouth every 6 (six) hours as needed for moderate pain.    [provider]  aspirin EC 81 MG tablet Take 81 mg by mouth daily.    [provider]  benztropine (COGENTIN) 0.5 MG tablet Take 0.5 mg by mouth every morning. 12/13/14   [provider]  bethanechol (URECHOLINE) 5 MG tablet TAKE 1 TABLET (5 MG TOTAL) BY MOUTH IN THE MORNING AND AT BEDTIME 06/04/23   McKenzie, Mardene Celeste, MD  ciclopirox (PENLAC) 8 % solution Apply topically at bedtime. Apply over nail and surrounding skin. Apply daily over previous coat. After seven (7) days, may remove with alcohol and continue cycle. 09/06/22   Vivi Barrack, DPM  cloZAPine (CLOZARIL) 100 MG tablet Take 400 mg by mouth at bedtime. 11/03/21   [provider]  finasteride (PROSCAR) 5 MG tablet TAKE 1 TABLET BY MOUTH EVERY DAY Patient taking differently: Take 5 mg by mouth daily. 05/17/20    McKenzie, Mardene Celeste, MD  furosemide (LASIX) 20 MG tablet Take 20 mg by mouth daily as needed for fluid or edema. 05/11/22   [provider]  Ipratropium-Albuterol (COMBIVENT RESPIMAT) 20-100 MCG/ACT AERS respimat INHALE 1 PUFF INTO THE LUNGS EVERY 6 HOURS AS NEEDED FOR WHEEZING OR SHORTNESS OF BREATH. 06/04/22   Coralyn Helling, MD  nystatin cream (MYCOSTATIN) Apply to affected area 2 times daily 05/05/23   Rondel Baton, MD  polyethylene glycol (MIRALAX / GLYCOLAX) 17 g packet Take 17 g by mouth daily as needed for moderate constipation. 11/16/21   Meredeth Ide, MD  Potassium 99 MG TABS Take 99 mg by mouth daily.    [provider]  tamsulosin (FLOMAX) 0.4 MG CAPS capsule TAKE 1 CAPSULE BY MOUTH TWICE A DAY 05/24/22   McKenzie, Mardene Celeste, MD  temazepam (RESTORIL) 15 MG capsule Take 15 mg by mouth at bedtime. 06/14/16   [provider]  VRAYLAR 6 MG CAPS Take 6 mg by mouth every morning. 10/24/21   [provider]      Allergies    Patient has no known allergies.    Review of Systems   Review of Systems  All other systems reviewed and are negative.   Physical Exam Updated Vital Signs BP 120/72 (BP Location: Right Arm)   Pulse 61  Temp 98.3 F (36.8 C) (Oral)   Resp (!) 22   SpO2 95%  Physical Exam Vitals and nursing note reviewed.  Constitutional:      General: He is not in acute distress.    Appearance: Normal appearance. He is well-developed.  HENT:     Head: Normocephalic and atraumatic.  Eyes:     Conjunctiva/sclera: Conjunctivae normal.     Pupils: Pupils are equal, round, and reactive to light.  Cardiovascular:     Rate and Rhythm: Normal rate and regular rhythm.     Heart sounds: Normal heart sounds.  Pulmonary:     Effort: Pulmonary effort is normal. No respiratory distress.     Breath sounds: Normal breath sounds.  Abdominal:     General: There is no distension.     Palpations: Abdomen is soft.     Tenderness: There is no  abdominal tenderness.  Musculoskeletal:        General: No deformity. Normal range of motion.     Cervical back: Normal range of motion and neck supple.  Skin:    General: Skin is warm and dry.  Neurological:     General: No focal deficit present.     Mental Status: He is alert and oriented to person, place, and time.     ED Results / Procedures / Treatments   Labs (all labs ordered are listed, but only abnormal results are displayed) Labs Reviewed  URINE CULTURE  CBC WITH DIFFERENTIAL/PLATELET  LIPASE, BLOOD  ETHANOL  COMPREHENSIVE METABOLIC PANEL  BRAIN NATRIURETIC PEPTIDE  AMMONIA  CK  PROTIME-INR  URINALYSIS, W/ REFLEX TO CULTURE (INFECTION SUSPECTED)  CBG MONITORING, ED  TROPONIN I (HIGH SENSITIVITY)    EKG None  Radiology No results found.  Procedures Procedures    Medications Ordered in ED Medications - No data to display  ED Course/ Medical Decision Making/ A&P                                 Medical Decision Making Amount and/or Complexity of Data Reviewed Labs: ordered. Radiology: ordered.  Risk Prescription drug management. Decision regarding hospitalization.    Medical Screen Complete  This patient presented to the ED with complaint of weakness, hypoglycemia.  This complaint involves an extensive number of treatment options. The initial differential diagnosis includes, but is not limited to, hypoglycemia, metabolic abnormality, dehydration, AKI, infection, etc.  This presentation is: Acute, Chronic, Self-Limited, Previously Undiagnosed, Uncertain Prognosis, Complicated, Systemic Symptoms, and Threat to Life/Bodily Function  Patient is presenting from home after fall with associated weakness.  Patient was noted to be hypoglycemic in the field.  This was treated with dextrose with EMS.  Patient's glucose again dropped and required additional dextrose here in the ED.  Additional abnormal lab values include potassium of 2.7.  White count is  13.4.  CKD 630.  Sodium is 133.  Given hypokalemiaand persistent hypoglycemia patient would benefit from admission.  Hospitalist service made aware of case.  Patient without clear indication of infection.  Will hold antibiotics at this time after discussion with hospitalist service. Additional history obtained:  External records from outside sources obtained and reviewed including prior ED visits and prior Inpatient records.    Lab Tests:  I ordered and personally interpreted labs.  The pertinent results include: CBC, CMP, troponin, ammonia, EtOH, lipase, UA, INR, troponin, CK, bnp   Imaging Studies ordered:  I ordered imaging studies including CT  head, CT C-spine, plain films of chest, CT chest abdomen pelvis I independently visualized and interpreted obtained imaging which showed NAD I agree with the radiologist interpretation.   Cardiac Monitoring:  The patient was maintained on a cardiac monitor.  I personally viewed and interpreted the cardiac monitor which showed an underlying rhythm of: NSR   Medicines ordered:  I ordered medication including dextrose, potassium for hypoglycemia, hypokalemia Reevaluation of the patient after these medicines showed that the patient: improved   Problem List / ED Course:  Weakness, hypokalemia, hypoglycemia   Reevaluation:  After the interventions noted above, I reevaluated the patient and found that they have: improved  Disposition:  After consideration of the diagnostic results and the patients response to treatment, I feel that the patent would benefit from admission.          Final Clinical Impression(s) / ED Diagnoses Final diagnoses:  Weakness  Hypokalemia  Hypoglycemia    Rx / DC Orders ED Discharge Orders     None         Wynetta Fines, MD 06/11/23 2002

## 2023-06-11 NOTE — ED Notes (Signed)
RN to room , pt denies any needs, CBG  check by tech, see chart. Be low and locked,rails x2 , call light in reach, pt on cardiac monitor, bp cuff and pulse ox at this time

## 2023-06-11 NOTE — H&P (Addendum)
History and Physical    Patient: Edwin [name] NFA:[id number] DOB: 1955-08-19 DOA: 06/11/2023 DOS: the patient was seen and examined on 06/11/2023 PCP: [facility]  Patient coming from: Home lives with niece  Chief Complaint:  Chief Complaint  Patient presents with   Fall   Hypoglycemia   HPI: AMR LONGS is a 67 y.o. male with medical history significant of prostate cancer s/p chronic Foley catheter, CKD 3A, HLD, anxiety and depression, and paranoid schizophrenia who presented to the ED via EMS after a fall at home.  Patient reports that his new psych meds have not been working and he has not been sleeping well over the last week.  Over the last 2 to 3 days, he has had decreased appetite occasional stomach upset so he ate very little.  States he was putting on his pants due to significant weakness, he fell on his back and hit his head on the floor.  He reports mild neck soreness and some blurry vision but denies any dizziness, chest pain, shortness of breath, palpitations, back pain, headache hematuria or dysuria.  On EMS arrival, patient was found to have a CBG of 40 and was given 12 g of D10 with improvement to 180.  ED course: Normal vitals on admission with mild tachypnea.  Labs showed CBG 88, WBC 13.4, Hgb 16.7, troponin 9-9, lipase 39, normal ethanol levels, K+ 2.6, sodium 133, creatinine 1.39, glucose 45, BNP 65.4, ammonia 22, CK 630, PT/INR 12.9/1.0, magnesium 2.3, phosphorus 2.9, UA showed mild hemoglobinuria, negative nitrate, moderate leuks, normal RBCs, WBC 6-10 (previously >50), and few bacteria CXR with no acute cardiopulmonary disease, CTH with no acute intracranial abnormality, CT cervical spine with no acute or traumatic findings and stable ACDF at C4-5.  CT C/A/P showed no acute chest findings, chronic right hydronephrosis with no stones and chronic left hydroureteronephrosis with non-obstructive stones, chronic bladder wall thickening.  Patient given 1 amp of D50  after CMP showed blood sugar of 45. Repeat CBG improved to 100. Patient started on D5 1/2NS and TRH consulted for admission.  Review of Systems: As mentioned in the history of present illness. All other systems reviewed and are negative. Past Medical History:  Diagnosis Date   Anxiety    Cancer (HCC)    prostate   CKD (chronic kidney disease) stage 3, GFR 30-59 ml/min (HCC)    Depression    GERD (gastroesophageal reflux disease)    under control   History of Clostridium difficile colitis    03-31-2015--  resolved   History of sepsis    secondary to UTI and ARF  x2 admission 03-31-2015 &  04-14-2015   Hyperlipidemia    Paranoid schizophrenia [facility])    Urinary incontinence    Past Surgical History:  Procedure Laterality Date   CERVICAL DISC SURGERY  1990's   CYSTOSCOPY N/A 03/08/2015   Procedure: CYSTOSCOPY;  Surgeon: Su Grand, MD;  Location: [facility];  Service: Urology;  Laterality: N/A;   CYSTOSCOPY  06/10/2015   Procedure: CYSTOSCOPY;  Surgeon: Malen Gauze, MD;  Location[facility];  Service: Urology;;   PROSTATE BIOPSY N/A 03/08/2015   Procedure: BIOPSY TRANSRECTAL ULTRASONIC PROSTATE (TUBP);  Surgeon: Su Grand, MD;  Location: [facility];  Service: Urology;  Laterality: N/A;   TRANSTHORACIC ECHOCARDIOGRAM  06-11-2015   grade 1 diastolic dysfunction, ef 55-60%;  trivial TR   TRANSURETHRAL RESECTION OF BLADDER TUMOR N/A 03/08/2015   Procedure: TRANSURETHRAL RESECTION OF BLADDER TUMOR (TURBT);  Surgeon:  Su Grand, MD;  Location: [facility];  Service: Urology;  Laterality: N/A;  TUR BIOPSY BLADDER   TRANSURETHRAL RESECTION OF PROSTATE N/A 06/27/2015   Procedure: TRANSURETHRAL RESECTION OF THE PROSTATE WITH GYRUS INSTRUMENTS;  Surgeon: Malen Gauze, MD;  Location: [facility];  Service: Urology;  Laterality: N/A;   TRANSURETHRAL RESECTION OF PROSTATE N/A 07/09/2016   Procedure: TRANSURETHRAL RESECTION OF THE PROSTATE (TURP);  Surgeon: Malen Gauze,  MD;  Location: [facility];  Service: Urology;  Laterality: N/A;   Social History:  reports that he has been smoking cigarettes. He has a 40 pack-year smoking history. He has never used smokeless tobacco. He reports that he does not drink alcohol and does not use drugs.  States he smokes about 1 ppd of cigarettes.  Denies EtOH use or illicit drug use.  No Known Allergies  Family History  Problem Relation Age of Onset   Breast cancer Mother     Prior to Admission medications   Medication Sig Start Date End Date Taking? Authorizing Provider  acetaminophen (TYLENOL) 325 MG tablet Take 650 mg by mouth every 6 (six) hours as needed for moderate pain.   Yes [provider]  aspirin EC 81 MG tablet Take 81 mg by mouth daily.   Yes [provider]  benztropine (COGENTIN) 0.5 MG tablet Take 0.5 mg by mouth every morning. 12/13/14  Yes [provider]  bethanechol (URECHOLINE) 5 MG tablet TAKE 1 TABLET (5 MG TOTAL) BY MOUTH IN THE MORNING AND AT BEDTIME 06/04/23  Yes McKenzie, Mardene Celeste, MD  CAPLYTA 42 MG capsule Take 42 mg by mouth at bedtime.   Yes [provider]  EX-LAX 15 MG CHEW Chew 15 mg by mouth every 6 (six) hours as needed (for constipation).   Yes [provider]  finasteride (PROSCAR) 5 MG tablet TAKE 1 TABLET BY MOUTH EVERY DAY Patient taking differently: Take 5 mg by mouth daily. 05/17/20  Yes McKenzie, Mardene Celeste, MD  furosemide (LASIX) 20 MG tablet Take 20 mg by mouth daily as needed for fluid or edema. 05/11/22  Yes [provider]  glimepiride (AMARYL) 4 MG tablet Take 4 mg by mouth daily with breakfast.   Yes [provider]  guaifenesin (HUMIBID E) 400 MG TABS tablet Take 400 mg by mouth every 4 (four) hours as needed (for chest congestion).   Yes [provider]  ibuprofen (ADVIL) 200 MG tablet Take 200-400 mg by mouth every 6 (six) hours as needed for mild pain (pain score 1-3) or headache.   Yes  [provider]  Ipratropium-Albuterol (COMBIVENT RESPIMAT) 20-100 MCG/ACT AERS respimat INHALE 1 PUFF INTO THE LUNGS EVERY 6 HOURS AS NEEDED FOR WHEEZING OR SHORTNESS OF BREATH. Patient taking differently: Inhale 1 puff into the lungs every 6 (six) hours as needed for wheezing or shortness of breath. 06/04/22  Yes Coralyn Helling, MD  metFORMIN (GLUCOPHAGE) 500 MG tablet Take 500 mg by mouth 2 (two) times daily with a meal.   Yes [provider]  Multiple Vitamin (MULTIVITAMIN) tablet Take 1 tablet by mouth daily.   Yes [provider]  oxybutynin (DITROPAN) 5 MG tablet Take 5 mg by mouth daily.   Yes [provider]  tamsulosin (FLOMAX) 0.4 MG CAPS capsule TAKE 1 CAPSULE BY MOUTH TWICE A DAY 05/24/22  Yes McKenzie, Mardene Celeste, MD  Tart [place] 500 MG CAPS Take 500 mg by mouth daily.   Yes [provider]  temazepam (  RESTORIL) 15 MG capsule Take 15 mg by mouth at bedtime. 06/14/16  Yes [provider]  Vitamin D, Ergocalciferol, (DRISDOL) 1.25 MG (50000 UNIT) CAPS capsule Take 50,000 Units by mouth every 7 (seven) days.   Yes [provider]  VRAYLAR 6 MG CAPS Take 6 mg by mouth every morning. 10/24/21  Yes [provider]  ciclopirox (PENLAC) 8 % solution Apply topically at bedtime. Apply over nail and surrounding skin. Apply daily over previous coat. After seven (7) days, may remove with alcohol and continue cycle. 09/06/22   Vivi Barrack, DPM  nystatin cream (MYCOSTATIN) Apply to affected area 2 times daily 05/05/23   Rondel Baton, MD  polyethylene glycol (MIRALAX / GLYCOLAX) 17 g packet Take 17 g by mouth daily as needed for moderate constipation. Patient not taking: Reported on 06/11/2023 11/16/21   Meredeth Ide, MD    Physical Exam: Vitals:   06/11/23 1820 06/11/23 1930 06/11/23 1940 06/11/23 2101  BP: 112/75 121/77  116/76  Pulse: 74 72 64 66  Resp: (!) 33 20 (!) 22 18  Temp:   98.6 F (37 C) 98.2 F (36.8 C)   TempSrc:   Oral Oral  SpO2: 98% 96% 96% 96%   General: Mildly disheveled man laying in bed. No acute distress. HEENT: [place]/AT. Anicteric sclera CV: RRR. No murmurs, rubs, or gallops. No LE edema Pulmonary: Lungs CTAB. Normal effort. No wheezing or rales. Abdominal: Soft, nontender, nondistended. Normal bowel sounds. Extremities: Palpable radial and DP pulses. Normal ROM. Skin: Warm and dry. No obvious rash or lesions. Neuro: A&Ox3. Moves all extremities. Normal sensation to light touch. No focal deficit. Psych: Flat affect  Data Reviewed:  CBG 88, WBC 13.4, Hgb 16.7, troponin 9-9, lipase 39, normal ethanol levels, K+ 2.6, sodium 133, creatinine 1.39, glucose 45, BNP 65.4, ammonia 22, CK 630, PT/INR 12.9/1.0, magnesium 2.3, phosphorus 2.9, UA showed mild hemoglobinuria, negative nitrate, moderate leuks, normal RBCs, WBC 6-10 (previously >50), and few bacteria CXR with no acute cardiopulmonary disease, CTH with no acute intracranial abnormality, CT cervical spine with no acute or traumatic findings and stable ACDF at C4-5. CT C/A/P showed no acute chest findings, chronic right hydronephrosis with no stones and chronic left hydroureteronephrosis with none obstructive stones, chronic bladder wall thickening EKG shows normal sinus rhythm  Assessment and Plan: THANIEL KALISH is a 67 y.o. male with medical history significant of prostate cancer s/p chronic Foley catheter, CKD 3A, HLD, anxiety and depression, and paranoid schizophrenia who presented to the ED via EMS after a fall at home and found to be hypoglycemic.  # Fall # Generalized weakness Patient with a history of schizophrenia and prostate cancer now with chronic Foley catheter presented after a fall at home due to generalized weakness. Patient found to be hypoglycemic with blood sugar of 40 in the setting of decreased p.o. intake over the last 3 to 4 days. Unclear why patient has stopped eating for the last few days however he states he  has intermittent upset stomach and does not think his psych meds are working. Patient with reported hypoglycemic episode in the ED requiring 1 amp of D50. He is hemodynamically stable and at baseline mental status. CT head and CT cervical spine both negative. He has mild neck soreness but no significant neck or back pain. -Continue D5 1/2NS at 100 cc/h -Encourage p.o. intake -Protein supplement between meals -Fall precautions -Follow-up vitamin D levels -PT/OT eval and treat  # Hypoglycemia # Diabetes Patient  with no documented diagnosis of diabetes but A1c on 11/08/21 elevated to 6.8%. Patient currently on metformin and glimepiride at home. Unclear if patient has type I or type 2 diabetes.  But significant the significant swings in his blood sugar likely indicating either type I or latent autoimmune diabetes in adults (LADA).  -Continue D5 1/2NS as above -Repeat A1c -Check insulin antibodies, C-peptide house and GAD antibodies -Every 4H CBG monitoring  # Hx prostate cancer # Hx of BPH # Chronic Foley catheter Patient with chronic Foley catheter and recurrent UTIs.  Patient was treated for UTI about 2 weeks ago.  Labs on admission showed improved leukocytosis from 21 to 13.  UA also improved compared to 2 weeks ago.  Patient is afebrile and without any urinary symptoms. There is no strong indication for antibiotics. -Continue Foley -Resume home oxybutynin, tamsulosin, bethanechol and finasteride -Follow-up with urology in the outpatient  # Hypokalemia K+ 2.6 on admission likely contributing to patient's weakness.  EKG shows normal sinus rhythm without any evidence of arrhythmia. Magnesium and phosphate within normal limits. -Repleting -Follow-up repeat BMP  # Schizophrenia # Anxiety/depression Patient reports that his psychiatrist recently made some changes to his medications.  States his current medications are not working.  -Consider inpatient psych consult to assist with meds versus  follow-up in the outpatient -Resume home Vraylar, Caplyta and Cogentin -Temazepam 15 mg daily at bedtime  # HLD -Check lipid panel  # CKD 3A Creatinine of 1.39 significantly improved from 1.60 two weeks ago.  Unclear baseline creatinine. -Continue IV fluid -Trend kidney function  # Tobacco use disorder Patient reports he smokes 1 ppd of cigarettes. He has been smoking since she was a teenager and sometimes smokes more or last done the 1 pack/day. States he is interested in quitting. He does not think he needs nicotine patch during this hospitalization. -Consider nicotine replacement therapy at discharge  Smoking cessation counseling for 4 minutes today, consider nicotine patch  I have discussed tobacco cessation with the patient.  I have counseled the patient regarding the negative impacts of continued tobacco use including but not limited to lung cancer, COPD, and cardiovascular disease.  I have discussed alternatives to tobacco and modalities that may help facilitate tobacco cessation including but not limited to biofeedback, hypnosis, and medications.  Total time spent with tobacco counseling was 4 minutes.    Advance Care Planning:   Code Status: Full Code   Consults: None  Family Communication: No family at bedside  Severity of Illness: The appropriate patient status for this patient is OBSERVATION. Observation status is judged to be reasonable and necessary in order to provide the required intensity of service to ensure the patient's safety. The patient's presenting symptoms, physical exam findings, and initial radiographic and laboratory data in the context of their medical condition is felt to place them at decreased risk for further clinical deterioration. Furthermore, it is anticipated that the patient will be medically stable for discharge from the hospital within 2 midnights of admission.   Author: Steffanie Rainwater, MD 06/11/2023 9:09 PM  For on call review  [email or web address].

## 2023-06-11 NOTE — ED Notes (Signed)
Pt provided with a [place] sandwich and 8oz milk

## 2023-06-11 NOTE — ED Notes (Signed)
.ED TO INPATIENT HANDOFF REPORT  Name/Age/Gender Edwin [name] 67 y.o. male  Code Status    Code Status Orders  (From admission, onward)           Start     Ordered   06/11/23 2008  Full code  Continuous       Question:  By:  Answer:  Consent: discussion documented in EHR   06/11/23 2008           Code Status History     Date Active Date Inactive Code Status Order ID Comments User Context   07/06/2022 1601 07/09/2022 1813 Full Code [id number]  Jonah Blue, MD Inpatient   11/08/2021 0853 11/16/2021 2158 Full Code [id number]  Teddy Spike, DO ED   11/08/2021 0404 11/08/2021 0853 Full Code [id number]  Angie Fava, DO ED   07/09/2016 1539 07/10/2016 1737 Full Code [id number]  Malen Gauze, MD Inpatient   06/27/2015 1621 06/28/2015 1555 Full Code [id number]  Malen Gauze, MD Inpatient   06/10/2015 1437 06/17/2015 2058 Full Code [id number]  Malen Gauze, MD Inpatient   04/14/2015 1859 04/18/2015 1702 Full Code [id number]  Alba Cory, MD Inpatient   03/31/2015 1754 04/07/2015 1855 Full Code [id number]  Alba Cory, MD Inpatient   01/13/2015 2014 01/20/2015 2238 Full Code [id number]  Hillary Bow, DO Inpatient       Home/SNF/Other Home  Chief Complaint Fall [W19.XXXA]  Level of Care/Admitting Diagnosis ED Disposition     ED Disposition  Admit   Condition  --   Comment  Hospital Area: [facility] [[id number]]  Level of Care: Med-Surg [16]  May place patient in observation at [facility] or [facility] if equivalent level of care is available:: No  Covid Evaluation: Asymptomatic - no recent exposure (last 10 days) testing not required  Diagnosis: Fall [[id number]]  Admitting Physician: Steffanie Rainwater [[id number]]  Attending Physician: Steffanie Rainwater [[id number]]          Medical History Past Medical History:  Diagnosis Date   Anxiety    Cancer (HCC)    prostate   CKD (chronic kidney disease) stage 3, GFR  30-59 ml/min (HCC)    Depression    GERD (gastroesophageal reflux disease)    under control   History of Clostridium difficile colitis    03-31-2015--  resolved   History of sepsis    secondary to UTI and ARF  x2 admission 03-31-2015 &  04-14-2015   Hyperlipidemia    Paranoid schizophrenia (HCC)    Urinary incontinence     Allergies No Known Allergies  IV Location/Drains/Wounds Patient Lines/Drains/Airways Status     Active Line/Drains/Airways     Name Placement date Placement time Site Days   Peripheral IV 06/11/23 20 G Left Antecubital 06/11/23  1550  Antecubital  less than 1   Urethral Catheter Rosalita Chessman PA Coude 05/26/23  2033  Coude  16            Labs/Imaging Results for orders placed or performed during the hospital encounter of 06/11/23 (from the past 48 hour(s))  CBG monitoring, ED     Status: None   Collection Time: 06/11/23  3:46 PM  Result Value Ref Range   Glucose-Capillary 88 70 - 99 mg/dL    Comment: Glucose reference range applies only to samples taken after fasting for at least 8 hours.  Urinalysis, w/ Reflex to Culture (Infection Suspected) -Urine, Clean Catch     Status: Abnormal  Collection Time: 06/11/23  4:03 PM  Result Value Ref Range   Specimen Source URINE, CLEAN CATCH    Color, Urine STRAW (A) YELLOW   APPearance CLEAR CLEAR   Specific Gravity, Urine 1.002 (L) 1.005 - 1.030   pH 6.0 5.0 - 8.0   Glucose, UA NEGATIVE NEGATIVE mg/dL   Hgb urine dipstick SMALL (A) NEGATIVE   Bilirubin Urine NEGATIVE NEGATIVE   Ketones, ur NEGATIVE NEGATIVE mg/dL   Protein, ur NEGATIVE NEGATIVE mg/dL   Nitrite NEGATIVE NEGATIVE   Leukocytes,Ua MODERATE (A) NEGATIVE   RBC / HPF 0-5 0 - 5 RBC/hpf   WBC, UA 6-10 0 - 5 WBC/hpf    Comment:        Reflex urine culture not performed if WBC <=10, OR if Squamous epithelial cells >5. If Squamous epithelial cells >5 suggest recollection.    Bacteria, UA FEW (A) NONE SEEN   Squamous Epithelial / HPF 0-5 0 - 5 /HPF     Comment: Performed at [facility], [address] [place], [place] [address]  CBC with Differential     Status: Abnormal   Collection Time: 06/11/23  4:23 PM  Result Value Ref Range   WBC 13.4 (H) 4.0 - 10.5 K/uL   RBC 6.22 (H) 4.22 - 5.81 MIL/uL   Hemoglobin 16.7 13.0 - 17.0 g/dL   HCT 60.4 54.0 - 98.1 %   MCV 82.0 80.0 - 100.0 fL   MCH 26.8 26.0 - 34.0 pg   MCHC 32.7 30.0 - 36.0 g/dL   RDW 19.1 47.8 - 29.5 %   Platelets 280 150 - 400 K/uL   nRBC 0.0 0.0 - 0.2 %   Neutrophils Relative % 86 %   Neutro Abs 11.4 (H) 1.7 - 7.7 K/uL   Lymphocytes Relative 8 %   Lymphs Abs 1.0 0.7 - 4.0 K/uL   Monocytes Relative 6 %   Monocytes Absolute 0.8 0.1 - 1.0 K/uL   Eosinophils Relative 0 %   Eosinophils Absolute 0.0 0.0 - 0.5 K/uL   Basophils Relative 0 %   Basophils Absolute 0.1 0.0 - 0.1 K/uL   Immature Granulocytes 0 %   Abs Immature Granulocytes 0.06 0.00 - 0.07 K/uL    Comment: Performed at [facility], [address] [place], [place] [address]  Troponin I (High Sensitivity)     Status: None   Collection Time: 06/11/23  4:23 PM  Result Value Ref Range   Troponin I (High Sensitivity) 9 <18 ng/L    Comment: (NOTE) Elevated high sensitivity troponin I (hsTnI) values and significant  changes across serial measurements may suggest ACS but many other  chronic and acute conditions are known to elevate hsTnI results.  Refer to the "Links" section for chest pain algorithms and additional  guidance. Performed at [facility], [address], [place], [place] [address]   Lipase, blood     Status: None   Collection Time: 06/11/23  4:23 PM  Result Value Ref Range   Lipase 39 11 - 51 U/L    Comment: Performed at [facility], [address], [place], [place] [address]  Ethanol     Status: None   Collection Time: 06/11/23  4:23 PM  Result Value Ref Range   Alcohol, Ethyl (B) <10 <10 mg/dL    Comment:  (NOTE) Lowest detectable limit for serum alcohol is 10 mg/dL.  For medical purposes only. Performed at [facility], [address] [place], [place] [address]   Comprehensive metabolic panel     Status: Abnormal   Collection Time: 06/11/23  4:23 PM  Result Value Ref Range   Sodium 133 (L) 135 - 145 mmol/L   Potassium 2.6 (LL) 3.5 - 5.1 mmol/L    Comment: CRITICAL RESULT CALLED TO, READ BACK BY AND VERIFIED WITH J.NELSON, RN AT 1802 ON 11.12.24 BY N.THOMPSON    Chloride 100 98 - 111 mmol/L   CO2 23 22 - 32 mmol/L   Glucose, Bld 45 (L) 70 - 99 mg/dL    Comment: Glucose reference range applies only to samples taken after fasting for at least 8 hours.   BUN 12 8 - 23 mg/dL   Creatinine, Ser 6.04 (H) 0.61 - 1.24 mg/dL   Calcium 9.2 8.9 - 54.0 mg/dL   Total Protein 7.3 6.5 - 8.1 g/dL   Albumin 4.1 3.5 - 5.0 g/dL   AST 26 15 - 41 U/L   ALT 21 0 - 44 U/L   Alkaline Phosphatase 74 38 - 126 U/L   Total Bilirubin 0.9 <1.2 mg/dL   GFR, Estimated 57 (L) >60 mL/min    Comment: (NOTE) Calculated using the CKD-EPI Creatinine Equation (2021)    Anion gap 10 5 - 15    Comment: Performed at [facility], [address], [place], [place] [address]  Brain natriuretic peptide     Status: None   Collection Time: 06/11/23  4:23 PM  Result Value Ref Range   B Natriuretic Peptide 65.4 0.0 - 100.0 pg/mL    Comment: Performed at [facility], [address], [place], [place] [address]  Ammonia     Status: None   Collection Time: 06/11/23  4:23 PM  Result Value Ref Range   Ammonia 22 9 - 35 umol/L    Comment: Performed at [facility], [address] [place], [place] [address]  CK     Status: Abnormal   Collection Time: 06/11/23  4:23 PM  Result Value Ref Range   Total CK 630 (H) 49 - 397 U/L    Comment: Performed at [facility], [address] [place], [place] [address]  Protime-INR     Status:  None   Collection Time: 06/11/23  4:23 PM  Result Value Ref Range   Prothrombin Time 12.9 11.4 - 15.2 seconds   INR 1.0 0.8 - 1.2    Comment: (NOTE) INR goal varies based on device and disease states. Performed at [facility], [address] [place], [place] [address]   CBG monitoring, ED     Status: Abnormal   Collection Time: 06/11/23  7:11 PM  Result Value Ref Range   Glucose-Capillary 100 (H) 70 - 99 mg/dL    Comment: Glucose reference range applies only to samples taken after fasting for at least 8 hours.   CT CHEST ABDOMEN PELVIS WO CONTRAST  Result Date: 06/11/2023 CLINICAL DATA:  Found on the ground. Unable to get of. Hypoglycemia. Abdominal distension. EXAM: CT CHEST, ABDOMEN AND PELVIS WITHOUT CONTRAST TECHNIQUE: Multidetector CT imaging of the chest, abdomen and pelvis was performed following the standard protocol without IV contrast. RADIATION DOSE REDUCTION: This exam was performed according to the departmental dose-optimization program which includes automated exposure control, adjustment of the mA and/or kV according to patient size and/or use of iterative reconstruction technique. COMPARISON:  07/06/2022.  11/08/2021. FINDINGS: CT CHEST FINDINGS Cardiovascular: Heart size is normal. No significant pericardial  fluid. Minimal thoracic aortic atherosclerotic calcification. No coronary artery calcification. Mediastinum/Nodes: No mediastinal or hilar mass or lymphadenopathy. Lungs/Pleura: No pleural effusion. Minimal patchy atelectasis or scarring in the left lower lobe. No mass or nodule. No emphysematous. Musculoskeletal: No spinal or rib fracture. Chronic bridging osteophytes in the thoracic spine. CT ABDOMEN PELVIS FINDINGS Hepatobiliary: Liver appears normal without contrast. No calcified gallstones. Pancreas: Normal Spleen: Normal Adrenals/Urinary Tract: Adrenal glands are normal. Chronic hydronephrosis on the right with a prominent extrarenal pelvis. The  ureter is dilated all the way to the bladder. No stone disease on the right. On the left, there is hydroureteronephrosis with several nonobstructing stones including a few in the dilated renal pelvis, the largest measuring up to 9 mm. No stone seen along the course of the dilated ureter to the bladder. There is a catheter in the bladder. The bladder shows chronic wall thickening. Stomach/Bowel: Stomach and small intestine are unremarkable. Normal appendix. No acute or significant: Finding. Vascular/Lymphatic: Aortic atherosclerosis.  No aneurysm. Reproductive: Normal Other: No free fluid or air. Musculoskeletal: No acute finding. 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 IMPRESSION: 1. No acute chest finding. 2. Chronic hydronephrosis on the right with a prominent extrarenal pelvis. The ureter is dilated all the way to the bladder. No stone disease on the right. 3. Chronic hydroureteronephrosis on the left with several nonobstructing stones including a few in the dilated renal pelvis, the largest measuring up to 9 mm. No stone seen along the course of the dilated ureter to the bladder. 4. Chronic bladder wall thickening. Catheter in the bladder. 5. Aortic atherosclerosis, very minimal. 6. 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 Aortic Atherosclerosis (ICD10-I70.0). Electronically Signed   By: Paulina Fusi M.D.   On: 06/11/2023 17:06   DG Chest Port 1 View  Result Date: 06/11/2023 CLINICAL DATA:  Weakness.  Hypoglycemia. EXAM: PORTABLE CHEST 1 VIEW COMPARISON:  07/05/2022 FINDINGS: Poor inspiration. Allowing for that, the heart and mediastinum are normal in the lungs are clear. No evidence of heart failure or pneumonia. No visible effusion. No abnormal bone finding. IMPRESSION: Poor inspiration. No active disease. Electronically Signed   By: Paulina Fusi M.D.   On: 06/11/2023 17:01   CT Cervical Spine Wo Contrast  Result Date: 06/11/2023 CLINICAL DATA:  Larey Seat.  Found on  the ground unable to get up. EXAM: CT CERVICAL SPINE WITHOUT CONTRAST TECHNIQUE: Multidetector CT imaging of the cervical spine was performed without intravenous contrast. Multiplanar CT image reconstructions were also generated. RADIATION DOSE REDUCTION: This exam was performed according to the departmental dose-optimization program which includes automated exposure control, adjustment of the mA and/or kV according to patient size and/or use of iterative reconstruction technique. COMPARISON:  01/13/2015 FINDINGS: Alignment: No malalignment. Skull base and vertebrae: No fracture or focal bone lesion. Distant ACDF C4-5. Prominent anterior osteophytes at C3-4, C5-6, C6-7 and C7-T1. Soft tissues and spinal canal: No soft tissue injury. Disc levels: The foramen magnum is widely patent. There is ordinary mild osteoarthritis of the C1-2 articulation but no encroachment upon the neural structures. C2-3: Small central calcified disc protrusion. No significant crow chin upon the canal. No foraminal stenosis. C3-4: Bulging of the disc.  No stenosis. C4-5: Previous ACDF with solid union and wide patency of the canal and foramina. C5-6: Solid bridging anterior osteophytes. Posterior osteophytes on the right. Bilateral bony foraminal stenosis could affect either C6 nerve. C6-7: Prominent anterior osteophytes. No canal or foraminal stenosis. C7-T1: Prominent anterior osteophytes. No canal  or foraminal stenosis. Upper chest: Negative Other: None IMPRESSION: 1. No acute or traumatic finding. Distant ACDF at C4-5 with solid union and wide patency of the canal and foramina. 2. Solid bridging anterior osteophytes at C5-6. Posterior osteophytes on the right. Bilateral bony foraminal stenosis could affect either C6 nerve. 3. Prominent anterior osteophytes at C6-7 and C7-T1. Electronically Signed   By: Paulina Fusi M.D.   On: 06/11/2023 17:00   CT Head Wo Contrast  Result Date: 06/11/2023 CLINICAL DATA:  Head trauma, minor. Unable  to get up from the ground. Hypoglycemia. EXAM: CT HEAD WITHOUT CONTRAST TECHNIQUE: Contiguous axial images were obtained from the base of the skull through the vertex without intravenous contrast. RADIATION DOSE REDUCTION: This exam was performed according to the departmental dose-optimization program which includes automated exposure control, adjustment of the mA and/or kV according to patient size and/or use of iterative reconstruction technique. COMPARISON:  08/10/2017 FINDINGS: Brain: The brain shows a normal appearance without evidence of malformation, atrophy, old or acute small or large vessel infarction, mass lesion, hemorrhage, hydrocephalus or extra-axial collection. Vascular: No hyperdense vessel. No evidence of atherosclerotic calcification. Skull: Normal.  No traumatic finding.  No focal bone lesion. Sinuses/Orbits: Sinuses are clear. Orbits appear normal. Mastoids are clear. Other: None significant IMPRESSION: Normal head CT. Electronically Signed   By: Paulina Fusi M.D.   On: 06/11/2023 16:57    Pending Labs Unresulted Labs (From admission, onward)     Start     Ordered   06/11/23 2030  VITAMIN D 25 Hydroxy (Vit-D Deficiency, Fractures)  Once,   R        06/11/23 2030   06/11/23 2011  Phosphorus  Once,   R        06/11/23 2010   06/11/23 2010  Magnesium  Once,   R        06/11/23 2010   06/11/23 1558  Urine Culture  Once,   URGENT       Question:  Indication  Answer:  Suprapubic pain   06/11/23 1558            Vitals/Pain Today's Vitals   06/11/23 1800 06/11/23 1820 06/11/23 1930 06/11/23 1940  BP: 110/68 112/75 121/77   Pulse: (!) 58 74 72 64  Resp: (!) 22 (!) 33 20 (!) 22  Temp:    98.6 F (37 C)  TempSrc:    Oral  SpO2: 98% 98% 96% 96%  PainSc:        Isolation Precautions No active isolations  Medications Medications  dextrose 5 % and 0.45 % NaCl infusion ( Intravenous New Bag/Given 06/11/23 1818)  enoxaparin (LOVENOX) injection 40 mg (has no administration  in time range)  acetaminophen (TYLENOL) tablet 650 mg (has no administration in time range)    Or  acetaminophen (TYLENOL) suppository 650 mg (has no administration in time range)  senna-docusate (Senokot-S) tablet 1 tablet (has no administration in time range)  ondansetron (ZOFRAN) tablet 4 mg (has no administration in time range)    Or  ondansetron (ZOFRAN) injection 4 mg (has no administration in time range)  potassium chloride SA (KLOR-CON M) CR tablet 40 mEq (has no administration in time range)  dextrose 50 % solution 50 mL (50 mLs Intravenous Given 06/11/23 1805)  potassium chloride 10 mEq in 100 mL IVPB (10 mEq Intravenous New Bag/Given 06/11/23 1903)    Mobility walks with device

## 2023-06-11 NOTE — Hospital Course (Addendum)
67 y.o. male with medical history significant of prostate cancer s/p chronic Foley catheter, CKD 3A, HLD, anxiety and depression, and paranoid schizophrenia who presented to the ED via EMS after a fall at home.  Upon EMS arrival, they identified the patient had a blood sugar of 40.  Patient was given dextrose and was brought into [facility] emergency department for evaluation.  Upon evaluation in the emergency department patient was found to exhibit recurrent hypoglycemia.  Hypoglycemia and volume depletion was felt to be etiologies in patient's fall.  Patient was therefore placed on dextrose infusion and hospitalist group was then called to assess the patient for admission to the hospital.  Patient was hydrated with intravenous isotonic fluids including dextrose.  Patient underwent frequent blood glucose checks.  Review of home medications revealed the patient was on metformin and glimepiride in the home setting both of which were held.  Over the course of the hospitalization patient's volume depletion and hypoglycemia resolved.  Patient and his brother both received education on appropriate management of blood sugar with her diabetic nurse educator during this hospitalization as well.  Patient also reported feelings of uneasiness, anxiety and inability to sleep over the past week ever since his psychiatric regimen was adjusted.  Medications were reviewed with psychiatry and was recommended that patient's Caplyta be discontinued for now until the patient followed up as an outpatient with his outpatient mental health provider.  At time of discharge, patient was instructed to discontinue his metformin and glimepiride going forward.  Instead, patient was instructed to take Januvia 100 mg once daily which would likely have a much lower risk of recurrent hypoglycemia.  Patient was also advised to continue to hold his Caplyta until outpatient follow-up with his behavioral health provider.  Patient was  discharged home in improved and stable condition.

## 2023-06-11 NOTE — ED Notes (Signed)
Provider in room at this time.

## 2023-06-11 NOTE — Plan of Care (Signed)
  Problem: Education: Goal: Ability to state activities that reduce stress will improve 06/11/2023 2108 by Jearl Klinefelter, RN Outcome: Progressing 06/11/2023 2107 by Jearl Klinefelter, RN Outcome: Progressing   Problem: Coping: Goal: Ability to identify and develop effective coping behavior will improve 06/11/2023 2108 by Jearl Klinefelter, RN Outcome: Progressing 06/11/2023 2107 by Jearl Klinefelter, RN Outcome: Progressing   Problem: Self-Concept: Goal: Ability to identify factors that promote anxiety will improve 06/11/2023 2108 by Jearl Klinefelter, RN Outcome: Progressing 06/11/2023 2107 by Jearl Klinefelter, RN Outcome: Progressing Goal: Level of anxiety will decrease 06/11/2023 2108 by Jearl Klinefelter, RN Outcome: Progressing 06/11/2023 2107 by Jearl Klinefelter, RN Outcome: Progressing Goal: Ability to modify response to factors that promote anxiety will improve 06/11/2023 2108 by Jearl Klinefelter, RN Outcome: Progressing 06/11/2023 2107 by Jearl Klinefelter, RN Outcome: Progressing

## 2023-06-11 NOTE — Plan of Care (Signed)

## 2023-06-11 NOTE — ED Triage Notes (Signed)
EMS reports from home, niece called for fall and unable to get up and AMS per neice. EMS CBG on scene 40. Pt staes he has not eaten in 3-4 days. Given 12gms D10 CBG response to 180. EMS also states Pt abdomen distended and urinary catheter in place on arrival. Pt alert and oriented on arrival.  BP 116/66 HR 80 RR 18 Sp02 96 RA CBG 180  20ga LAC

## 2023-06-12 DIAGNOSIS — N1831 Chronic kidney disease, stage 3a: Secondary | ICD-10-CM | POA: Diagnosis not present

## 2023-06-12 DIAGNOSIS — R531 Weakness: Principal | ICD-10-CM

## 2023-06-12 DIAGNOSIS — F17210 Nicotine dependence, cigarettes, uncomplicated: Secondary | ICD-10-CM | POA: Diagnosis not present

## 2023-06-12 DIAGNOSIS — E162 Hypoglycemia, unspecified: Secondary | ICD-10-CM

## 2023-06-12 DIAGNOSIS — W19XXXA Unspecified fall, initial encounter: Secondary | ICD-10-CM | POA: Diagnosis not present

## 2023-06-12 LAB — BASIC METABOLIC PANEL
Anion gap: 9 (ref 5–15)
BUN: 11 mg/dL (ref 8–23)
CO2: 22 mmol/L (ref 22–32)
Calcium: 8.8 mg/dL — ABNORMAL LOW (ref 8.9–10.3)
Chloride: 103 mmol/L (ref 98–111)
Creatinine, Ser: 1.3 mg/dL — ABNORMAL HIGH (ref 0.61–1.24)
GFR, Estimated: >60 mL/min (ref >60)
Glucose, Bld: 80 mg/dL (ref 70–99)
Potassium: 3.3 mmol/L — ABNORMAL LOW (ref 3.5–5.1)
Sodium: 134 mmol/L — ABNORMAL LOW (ref 135–145)

## 2023-06-12 LAB — GLUCOSE, CAPILLARY
Glucose-Capillary: 67 mg/dL — ABNORMAL LOW (ref 70–99)
Glucose-Capillary: 114 mg/dL — ABNORMAL HIGH (ref 70–99)
Glucose-Capillary: 98 mg/dL (ref 70–99)
Glucose-Capillary: 135 mg/dL — ABNORMAL HIGH (ref 70–99)
Glucose-Capillary: 164 mg/dL — ABNORMAL HIGH (ref 70–99)

## 2023-06-12 LAB — LIPID PANEL
Cholesterol: 154 mg/dL (ref 0–200)
HDL: 35 mg/dL — ABNORMAL LOW (ref >40)
LDL Cholesterol: 75 mg/dL (ref 0–99)
Total CHOL/HDL Ratio: 4.4 {ratio}
Triglycerides: 218 mg/dL — ABNORMAL HIGH (ref <150)
VLDL: 44 mg/dL — ABNORMAL HIGH (ref 0–40)

## 2023-06-12 LAB — CBC
HCT: 48.1 % (ref 39.0–52.0)
Hemoglobin: 15.8 g/dL (ref 13.0–17.0)
MCH: 27.2 pg (ref 26.0–34.0)
MCHC: 32.8 g/dL (ref 30.0–36.0)
MCV: 82.8 fL (ref 80.0–100.0)
Platelets: 288 10*3/uL (ref 150–400)
RBC: 5.81 MIL/uL (ref 4.22–5.81)
RDW: 14.8 % (ref 11.5–15.5)
WBC: 17.1 10*3/uL — ABNORMAL HIGH (ref 4.0–10.5)
nRBC: 0 % (ref 0.0–0.2)

## 2023-06-12 LAB — VITAMIN D 25 HYDROXY (VIT D DEFICIENCY, FRACTURES): Vit D, 25-Hydroxy: 41.87 ng/mL (ref 30–100)

## 2023-06-12 LAB — HEMOGLOBIN A1C
Hgb A1c MFr Bld: 14.9 % — ABNORMAL HIGH (ref 4.8–5.6)
Mean Plasma Glucose: 380.93 mg/dL

## 2023-06-12 MED ORDER — POTASSIUM CHLORIDE CRYS ER 20 MEQ PO TBCR
40.0000 meq | EXTENDED_RELEASE_TABLET | Freq: Once | ORAL | Status: AC
  Administered 2023-06-12: 40 meq via ORAL
  Filled 2023-06-12: qty 2

## 2023-06-12 NOTE — Plan of Care (Signed)
  Problem: Education: Goal: Ability to state activities that reduce stress will improve Outcome: Progressing   Problem: Coping: Goal: Ability to identify and develop effective coping behavior will improve Outcome: Progressing   Problem: Self-Concept: Goal: Ability to identify factors that promote anxiety will improve Outcome: Progressing Goal: Level of anxiety will decrease Outcome: Progressing Goal: Ability to modify response to factors that promote anxiety will improve Outcome: Progressing   Problem: Education: Goal: Knowledge of General Education information will improve Description: Including pain rating scale, medication(s)/side effects and non-pharmacologic comfort measures Outcome: Progressing

## 2023-06-12 NOTE — Evaluation (Signed)
Occupational Therapy Evaluation Patient Details Name: Edwin [name] MRN: [id number] DOB: 1955-10-06 Today's Date: 06/12/2023   History of Present Illness Edwin [name] is a 67 yr old male presents from home after fall.  CT head and CT cervical spine both negative. Found to have hypoglycemia. PMH: prostate cancer s/p chronic Foley catheter, CKD 3A, HLD, anxiety, depression, and paranoid schizophrenia   Clinical Impression   The pt performed all assessed ADLs with supervision or better, including lower body dressing and grooming in standing at sink level. He also performed sit to stand and ambulation using a RW with SBA to CGA. He appears to be very near to his baseline level of functioning for self-care management & he is not presenting with acute functional deficits that warrant the need for further OT services. OT will sign off and recommend he return home with family upon discharge.       If plan is discharge home, recommend the following: Help with stairs or ramp for entrance;Assistance with cooking/housework;Direct supervision/assist for medications management    Functional Status Assessment  Patient has not had a recent decline in their functional status  Equipment Recommendations  Tub/shower seat    Recommendations for Other Services       Precautions / Restrictions Precautions Precautions: Fall Precaution Comments: chronic foley Restrictions Weight Bearing Restrictions: No      Mobility Bed Mobility               General bed mobility comments: pt was received seated in the chair    Transfers Overall transfer level: Needs assistance Equipment used: Rolling walker (2 wheels) Transfers: Sit to/from Stand Sit to Stand: Supervision                  Balance Overall balance assessment: Mild deficits observed, not formally tested        ADL either performed or assessed with clinical judgement   ADL Overall ADL's : At baseline      General ADL  Comments: The pt performed all assessed ADLs with supervision or better, including simulated upper body dressing, lower body dressing, and grooming in standing at sink level.                  Pertinent Vitals/Pain Pain Assessment Pain Assessment: No/denies pain     Extremity/Trunk Assessment Upper Extremity Assessment Upper Extremity Assessment: Overall WFL for tasks assessed;Right hand dominant   Lower Extremity Assessment Lower Extremity Assessment: Overall WFL for tasks assessed      Communication Communication Communication: No apparent difficulties   Cognition Arousal: Alert Behavior During Therapy: Flat affect Overall Cognitive Status: No family/caregiver present to determine baseline cognitive functioning      General Comments: Pt with flat affect, states name, birthday, location, and situation appropriately, required increased time to recall year. Pt with slightly decreased initiation. Able to follow 1 step commands.                Home Living Family/patient expects to be discharged to:: Private residence Living Arrangements: Other relatives (niece) Available Help at Discharge: Family;Available PRN/intermittently Type of Home: House Home Access: Stairs to enter Entrance Stairs-Number of Steps: 3   Home Layout: One level               Home Equipment: Agricultural consultant (2 wheels);Cane - single point          Prior Functioning/Environment Prior Level of Function : Independent/Modified Independent    ADLs : He reported being independent  with ADLs & sharing household chores with his niece Mobility: He reported he is supposed to use a RW for ambulation, however he does not use it.                     OT Problem List: Impaired balance (sitting and/or standing)      OT Treatment/Interventions:   N/A      OT Frequency:  N/A       AM-PAC OT "6 Clicks" Daily Activity     Outcome Measure Help from another person eating meals?: None Help  from another person taking care of personal grooming?: None Help from another person toileting, which includes using toliet, bedpan, or urinal?: None Help from another person bathing (including washing, rinsing, drying)?: A Little Help from another person to put on and taking off regular upper body clothing?: None Help from another person to put on and taking off regular lower body clothing?: None 6 Click Score: 23   End of Session Equipment Utilized During Treatment: Rolling walker (2 wheels);Gait belt Nurse Communication: Mobility status  Activity Tolerance: Patient tolerated treatment well Patient left: in chair;with call bell/phone within reach;with chair alarm set  OT Visit Diagnosis: Muscle weakness (generalized) (M62.81)                Time: 9604-5409 OT Time Calculation (min): 15 min Charges:  OT General Charges $OT Visit: 1 Visit OT Evaluation $OT Eval Low Complexity: 1 Low    Charl Wellen L Jamesa Tedrick, OTR/L 06/12/2023, 1:41 PM

## 2023-06-12 NOTE — Care Management Obs Status (Signed)
MEDICARE OBSERVATION STATUS NOTIFICATION   Patient Details  Name: Edwin [name] MRN: [id number] Date of Birth: 10-Jul-1956   Medicare Observation Status Notification Given:  Yes    Otelia Santee, LCSW 06/12/2023, 3:25 PM

## 2023-06-12 NOTE — Progress Notes (Addendum)
PROGRESS NOTE   Edwin [name]  ZOX:[id number] DOB: 02/24/1956 DOA: 06/11/2023 PCP: [facility]   Date of Service: the patient was seen and examined on 06/12/2023  Brief Narrative:  67 y.o. male with medical history significant of prostate cancer s/p chronic Foley catheter, CKD 3A, HLD, anxiety and depression, and paranoid schizophrenia who presented to the ED via EMS after a fall at home.  Upon EMS arrival, they identified the patient had a blood sugar of 40.  Patient was given dextrose and was brought into [facility] emergency department for evaluation.  Upon evaluation in the emergency department patient was found to exhibit recurrent hypoglycemia.  Hypoglycemia and volume depletion was felt to be etiologies in patient's fall.  Patient was therefore placed on dextrose infusion and hospitalist group was then called to assess the patient for admission to the hospital.  Patient was hydrated with intravenous isotonic fluids including dextrose.  Patient underwent frequent blood glucose checks.  Review of home medications revealed the patient was on metformin and glimepiride in the home setting both of which were held.  Over the course of the hospitalization patient's volume depletion and hypoglycemia resolved.  Patient and his brother both received education on appropriate management of blood sugar with her diabetic nurse educator during this hospitalization as well.   Assessment & Plan Fall at home, initial encounter Likely related to volume depletion and hyperglycemia due to current hypoglycemic regimen Attempted to transition off of intravenous fluids, encouraging oral Attempting to ambulate PT evaluation Uncontrolled type 2 diabetes mellitus with hypoglycemia, without long-term current use of insulin (HCC) Currently holding metformin and glimepiride Frequent Accu-Cheks Diabetic nurse educator evaluation Patient may benefit from transitioning to other oral hypoglycemics  that are less likely to cause hypoglycemia prior to discharge Weakness Again, likely secondary to volume depletion and hyperglycemia Seemingly improving with correction of sugars and intravenous hydration PT evaluation Schizophrenia (HCC) Patient reporting several days of not being able to sleep since his psychiatric regimen was adjusted by his outpatient psychiatrist Will discuss with psychiatry while here, adjust regimen per the recommendations Hypokalemia Replacing with potassium chloride Evaluating for concurrent hypomagnesemia  Monitoring potassium levels with serial chemistries.  Chronic kidney disease, stage 3a (HCC) Strict intake and output monitoring Creatinine near baseline Minimizing nephrotoxic agents as much as possible Serial chemistries to monitor renal function and electrolytes  Nicotine dependence, cigarettes, uncomplicated Patient is being counseled daily on smoking cessation. Providing patient with nicotine replacement therapy during this hospitalization.      Subjective:  Patient states that his generalized weakness is substantially improved compared to when he presented.  Patient still is reporting difficulty with sleeping last night.  Patient denies any associated pain.  Physical Exam:  Vitals:        BP: 103/69     Pulse: 68     Resp:      Temp:      TempSrc:      SpO2:      Weight:      Height:         Constitutional: Awake alert and oriented x3, no associated distress.   Skin: no rashes, no lesions, good skin turgor noted. Eyes: Pupils are equally reactive to light.  No evidence of scleral icterus or conjunctival pallor.  ENMT: Moist mucous membranes noted.  Posterior pharynx clear of any exudate or lesions.   Respiratory: clear to auscultation bilaterally, no wheezing, no crackles. Normal respiratory effort. No accessory muscle use.  Cardiovascular: Regular rate  and rhythm, no murmurs / rubs / gallops. No extremity edema. 2+ pedal pulses. No  carotid bruits.  Abdomen: Abdomen is soft and nontender.  No evidence of intra-abdominal masses.  Positive bowel sounds noted in all quadrants.   Musculoskeletal: No joint deformity upper and lower extremities. Good ROM, no contractures. Normal muscle tone.    Data Reviewed:  K: 3.3 Na 134   Code Status:  Full code.  Code status decision has been confirmed with: patient Family Communication: Case discussed with brother via phone conversation   Severity of Illness:  The appropriate patient status for this patient is OBSERVATION. Observation status is judged to be reasonable and necessary in order to provide the required intensity of service to ensure the patient's safety. The patient's presenting symptoms, physical exam findings, and initial radiographic and laboratory data in the context of their medical condition is felt to place them at decreased risk for further clinical deterioration. Furthermore, it is anticipated that the patient will be medically stable for discharge from the hospital within 2 midnights of admission.   Time spent:  50 minutes  Author:  Marinda Elk MD  06/12/2023 3:52 PM

## 2023-06-12 NOTE — Plan of Care (Signed)
  Problem: Education: Goal: Ability to state activities that reduce stress will improve Outcome: Progressing   Problem: Self-Concept: Goal: Ability to modify response to factors that promote anxiety will improve Outcome: Progressing   Problem: Education: Goal: Knowledge of General Education information will improve Description: Including pain rating scale, medication(s)/side effects and non-pharmacologic comfort measures Outcome: Progressing   Problem: Clinical Measurements: Goal: Will remain free from infection Outcome: Progressing Goal: Diagnostic test results will improve Outcome: Progressing   Problem: Nutrition: Goal: Adequate nutrition will be maintained Outcome: Progressing   Problem: Elimination: Goal: Will not experience complications related to bowel motility Outcome: Progressing Goal: Will not experience complications related to urinary retention Outcome: Progressing   Problem: Pain Management: Goal: General experience of comfort will improve Outcome: Progressing   Problem: Safety: Goal: Ability to remain free from injury will improve Outcome: Progressing   Problem: Skin Integrity: Goal: Risk for impaired skin integrity will decrease Outcome: Progressing

## 2023-06-12 NOTE — Plan of Care (Signed)
  Problem: Education: Goal: Ability to state activities that reduce stress will improve 06/12/2023 0359 by Jearl Klinefelter, RN Outcome: Progressing 06/11/2023 2108 by Jearl Klinefelter, RN Outcome: Progressing 06/11/2023 2107 by Jearl Klinefelter, RN Outcome: Progressing   Problem: Coping: Goal: Ability to identify and develop effective coping behavior will improve 06/12/2023 0359 by Jearl Klinefelter, RN Outcome: Progressing 06/11/2023 2108 by Jearl Klinefelter, RN Outcome: Progressing 06/11/2023 2107 by Jearl Klinefelter, RN Outcome: Progressing

## 2023-06-12 NOTE — TOC Initial Note (Signed)
Transition of Care [facility]) - Initial/Assessment Note    Patient Details  Name: Edwin [name] MRN: [id number] Date of Birth: 1956-05-14  Transition of [facility]) CM/SW Contact:    Otelia Santee, LCSW Phone Number: 06/12/2023, 1:03 PM  Clinical Narrative:                 Met with pt to discuss discharge plans. Pt is agreeable to home health being arranged and reports not having a preference for agency. HHPT has been arranged with [facility]. HH order will need to be placed prior to discharge. Pt declines having RW ordered and states he already has one at home.  TOC will continue to follow.   Expected Discharge Plan: Home w Home Health Services Barriers to Discharge: No Barriers Identified   Patient Goals and CMS Choice Patient states their goals for this hospitalization and ongoing recovery are:: To go home CMS Medicare.gov Compare Post Acute Care list provided to:: Patient Choice offered to / list presented to : Patient [place] ownership interest in [facility].provided to::  (NA)    Expected Discharge Plan and Services In-house Referral: Clinical Social Work Discharge Planning Services: NA Post Acute Care Choice: Home Health Living arrangements for the past 2 months: Single Family Home                           HH Arranged: PT HH Agency: [facility] Date [facility] Agency Contacted: 06/12/23 Time HH Agency Contacted: 1302 Representative spoke with at [facility] Agency: Cindie  Prior Living Arrangements/Services Living arrangements for the past 2 months: Single Family Home Lives with:: Self Patient language and need for interpreter reviewed:: Yes Do you feel safe going back to the place where you live?: Yes      Need for Family Participation in Patient Care: No (Comment) Care giver support system in place?: No (comment) Current home services: DME (RW) Criminal Activity/Legal Involvement Pertinent to Current Situation/Hospitalization: No - Comment as  needed  Activities of Daily Living   ADL Screening (condition at time of admission) Independently performs ADLs?: No Does the patient have a NEW difficulty with bathing/dressing/toileting/self-feeding that is expected to last >3 days?: Yes (Initiates electronic notice to provider for possible OT consult) Does the patient have a NEW difficulty with getting in/out of bed, walking, or climbing stairs that is expected to last >3 days?: Yes (Initiates electronic notice to provider for possible PT consult) Does the patient have a NEW difficulty with communication that is expected to last >3 days?: No Is the patient deaf or have difficulty hearing?: No Does the patient have difficulty seeing, even when wearing glasses/contacts?: Yes Does the patient have difficulty concentrating, remembering, or making decisions?: Yes  Permission Sought/Granted Permission sought to share information with : Facility Medical sales representative, Family Supports Permission granted to share information with : Yes, Verbal Permission Granted              Emotional Assessment Appearance:: Appears stated age Attitude/Demeanor/Rapport: Engaged Affect (typically observed): Accepting Orientation: : Oriented to Self, Oriented to Place, Oriented to  Time, Oriented to Situation Alcohol / Substance Use: Not Applicable Psych Involvement: Outpatient Provider  Admission diagnosis:  Hypokalemia [E87.6] Weakness [R53.1] Hypoglycemia [E16.2] Fall [W19.XXXA] Patient Active Problem List   Diagnosis Date Noted   Hypoglycemia 06/12/2023   Weakness 06/12/2023   Fall 06/11/2023   Schizophrenia (HCC) 07/06/2022   Restrictive lung disease 02/01/2022   Bilateral lower extremity edema  02/01/2022   Tobacco use disorder 02/01/2022   Severe sepsis (HCC) 11/08/2021   Stage 3a chronic kidney disease (CKD) (HCC) 11/08/2021   History of prostate cancer 11/08/2021   Hypokalemia 11/08/2021   Hypomagnesemia 11/08/2021   Hyperglycemia  11/08/2021   Chronic cough    Hypothermia 08/10/2017   Leukocytosis 08/10/2017   Hyponatremia 08/10/2017   BPH (benign prostatic hyperplasia) 06/27/2015   Malnutrition of moderate degree 06/13/2015   Lower urinary tract infectious disease 06/10/2015   Enteritis due to Clostridium difficile 04/04/2015   Sepsis (HCC) 03/31/2015   Acute encephalopathy 03/31/2015   Sepsis secondary to UTI (HCC) 01/13/2015   Paranoid schizophrenia, chronic condition (HCC) 01/13/2015   AKI (acute kidney injury) (HCC) 01/13/2015   Rhabdomyolysis 01/13/2015   HLD (hyperlipidemia) 01/13/2015   PCP:  [facility] Pharmacy:   [facility] [phone] - [place], [place] - [address] AT [address] OF [facility] [address] [place] [address] Phone: [phone] Fax: [phone]     Social Determinants of Health (SDOH) Social History: SDOH Screenings   Food Insecurity: No Food Insecurity (06/11/2023)  Housing: Low Risk  (06/11/2023)  Transportation Needs: No Transportation Needs (06/11/2023)  Utilities: Not At Risk (06/11/2023)  Social Connections: Unknown (07/05/2022)   Received from [facility]  Tobacco Use: High Risk (06/11/2023)   SDOH Interventions:     Readmission Risk Interventions     No data to display

## 2023-06-12 NOTE — Evaluation (Signed)
Physical Therapy Evaluation Patient Details Name: Edwin [name] MRN: [id number] DOB: [birth year] Today's Date: 06/12/2023  History of Present Illness  Edwin [name] is a 67 y.o. male presents from home after fall.  CT head and CT cervical spine both negative. PMH: prostate cancer s/p chronic Foley catheter, CKD 3A, HLD, anxiety, depression, and paranoid schizophrenia  Clinical Impression  Pt admitted with above diagnosis. Pt from home with niece present, pt reports ind with ambulation, no AD, reports change in medications recently and pt thinks that is why he has had 2 falls at home needing niece's assistance to get up. Pt currently needing CGA with mobility, therapist managing IV line and foley catheter for safety. Pt without complaints during eval, tolerates short distance walking and sitting up in recliner at EOS. Pt would benefit from HHPT at d/c with family support at home. Pt currently with functional limitations due to the deficits listed below (see PT Problem List). Pt will benefit from acute skilled PT to increase their independence and safety with mobility to allow discharge.           If plan is discharge home, recommend the following: A little help with walking and/or transfers;A little help with bathing/dressing/bathroom;Assistance with cooking/housework;Help with stairs or ramp for entrance;Assist for transportation   Can travel by private vehicle        Equipment Recommendations Rolling walker (2 wheels)  Recommendations for Other Services       Functional Status Assessment Patient has had a recent decline in their functional status and demonstrates the ability to make significant improvements in function in a reasonable and predictable amount of time.     Precautions / Restrictions Precautions Precautions: Fall Precaution Comments: chronic foley Restrictions Weight Bearing Restrictions: No      Mobility  Bed Mobility Overal bed mobility: Needs Assistance Bed  Mobility: Supine to Sit     Supine to sit: Supervision, HOB elevated     General bed mobility comments: supv, therapist managing IV line and foley catheter line for safety, no physical assist or bedrail use    Transfers Overall transfer level: Needs assistance Equipment used: Rolling walker (2 wheels) Transfers: Sit to/from Stand Sit to Stand: Contact guard assist           General transfer comment: verbal cues for pushing from seated surface    Ambulation/Gait Ambulation/Gait assistance: Contact guard assist Gait Distance (Feet): 40 Feet Assistive device: Rolling walker (2 wheels) Gait Pattern/deviations: Step-through pattern, Decreased stride length Gait velocity: decreased     General Gait Details: slow steps with minimal bil foot clearance, stiff trunk, verbal cues for RW management, no overt LOB, pt requests to return to recliner but unable to state what is limiting  Careers information officer     Tilt Bed    Modified Rankin (Stroke Patients Only)       Balance Overall balance assessment: Mild deficits observed, not formally tested, History of Falls                                           Pertinent Vitals/Pain Pain Assessment Pain Assessment: No/denies pain    Home Living Family/patient expects to be discharged to:: Private residence Living Arrangements: Other relatives Available Help at Discharge: Family;Available PRN/intermittently Type of Home: House Home Access: Stairs to enter Entrance Stairs-Rails:  None Entrance Stairs-Number of Steps: "I think 3"   Home Layout: One level Home Equipment: None      Prior Function Prior Level of Function : Independent/Modified Independent;Driving             Mobility Comments: pt reports ind, no AD, reports 2 falls since starting new medication and needed niece's assistance to get up ADLs Comments: pt reports ind, niece is present to assist with household chores      Extremity/Trunk Assessment   Upper Extremity Assessment Upper Extremity Assessment: Defer to OT evaluation    Lower Extremity Assessment Lower Extremity Assessment: Overall WFL for tasks assessed (AROM WFL, strength grossly 4/5, reports "a little" numbness/tingling in bil feet)    Cervical / Trunk Assessment Cervical / Trunk Assessment: Normal  Communication   Communication Communication: No apparent difficulties  Cognition Arousal: Lethargic Behavior During Therapy: Flat affect Overall Cognitive Status: No family/caregiver present to determine baseline cognitive functioning                                 General Comments: pt flat, states name, age, location and situation appropriately, unable to state date or year. Pt with decreased initaition, needing increased time to complete tasks        General Comments      Exercises     Assessment/Plan    PT Assessment Patient needs continued PT services  PT Problem List Decreased activity tolerance;Decreased balance;Decreased cognition;Decreased knowledge of use of DME       PT Treatment Interventions DME instruction;Gait training;Stair training;Functional mobility training;Therapeutic activities;Therapeutic exercise;Balance training;Neuromuscular re-education;Patient/family education    PT Goals (Current goals can be found in the Care Plan section)  Acute Rehab PT Goals Patient Stated Goal: return home PT Goal Formulation: With patient Time For Goal Achievement: 06/26/23 Potential to Achieve Goals: Good    Frequency Min 1X/week     Co-evaluation               AM-PAC PT "6 Clicks" Mobility  Outcome Measure Help needed turning from your back to your side while in a flat bed without using bedrails?: A Little Help needed moving from lying on your back to sitting on the side of a flat bed without using bedrails?: A Little Help needed moving to and from a bed to a chair (including a wheelchair)?: A  Little Help needed standing up from a chair using your arms (e.g., wheelchair or bedside chair)?: A Little Help needed to walk in hospital room?: A Little Help needed climbing 3-5 steps with a railing? : A Lot 6 Click Score: 17    End of Session Equipment Utilized During Treatment: Gait belt Activity Tolerance: Patient tolerated treatment well Patient left: in chair;with call bell/phone within reach;with chair alarm set Nurse Communication: Mobility status PT Visit Diagnosis: Other abnormalities of gait and mobility (R26.89);History of falling (Z91.81)    Time: 0981-1914 PT Time Calculation (min) (ACUTE ONLY): 19 min   Charges:   PT Evaluation $PT Eval Moderate Complexity: 1 Mod   PT General Charges $$ ACUTE PT VISIT: 1 Visit         Tori Odas Ozer PT, DPT 06/12/23, 9:50 AM

## 2023-06-12 NOTE — Inpatient Diabetes Management (Signed)
Inpatient Diabetes Program Recommendations  AACE/ADA: New Consensus Statement on Inpatient Glycemic Control (2015)  Target Ranges:  Prepandial:   less than 140 mg/dL      Peak postprandial:   less than 180 mg/dL (1-2 hours)      Critically ill patients:  140 - 180 mg/dL   Lab Results  Component Value Date   GLUCAP 98 06/12/2023   HGBA1C 14.9 (H) 06/11/2023    Review of Glycemic Control  Latest Reference Range & Units 06/11/23 15:46 06/11/23 19:11 06/11/23 21:05 06/11/23 23:34 06/12/23 03:54 06/12/23 07:31 06/12/23 12:11  Glucose-Capillary 70 - 99 mg/dL 88 782 (H) 45 (L) 66 (L) 67 (L) 114 (H) 98   Diabetes history: DM 2 Outpatient Diabetes medications: Amaryl 4 mg Daily, Metformin 500 mg bid Current orders for Inpatient glycemic control:  Non insulin ordered D5 1/2 NS 100 ml/hr  Ensure Enlive bid between meals (40 grams of carbohydrates)  Inpatient Diabetes Program Recommendations:    -  may consider insulin levels if glucose trends continue to drop -   may also consider cortisol and thyroid levels  Spoke with pt at bedside. Pt is on Amaryl at home which can drop glucose levels. Pt lives with niece and reports getting help with CBGs and medication at home. Noted A1c very high 14.9% on 11/12 not consistent with glucose trends even lack of PO intake. Most Psychiatric medications increase glucose levels, not lower. Pt reports drinking diet and sugar free beverages at home. Pt reports following up with his doctor regularly.   Will follow glucose trends and try to determine current needs and adjust plan of care for discharge for safety.  Thanks,  Christena Deem RN, MSN, BC-ADM Inpatient Diabetes Coordinator Team Pager [phone] (8a-5p)

## 2023-06-13 ENCOUNTER — Telehealth (HOSPITAL_COMMUNITY): Payer: Self-pay | Admitting: Pharmacy Technician

## 2023-06-13 ENCOUNTER — Other Ambulatory Visit (HOSPITAL_COMMUNITY): Payer: Self-pay

## 2023-06-13 DIAGNOSIS — Y92009 Unspecified place in unspecified non-institutional (private) residence as the place of occurrence of the external cause: Secondary | ICD-10-CM

## 2023-06-13 DIAGNOSIS — E11649 Type 2 diabetes mellitus with hypoglycemia without coma: Secondary | ICD-10-CM

## 2023-06-13 DIAGNOSIS — R531 Weakness: Principal | ICD-10-CM

## 2023-06-13 DIAGNOSIS — F209 Schizophrenia, unspecified: Secondary | ICD-10-CM

## 2023-06-13 DIAGNOSIS — N1831 Chronic kidney disease, stage 3a: Secondary | ICD-10-CM

## 2023-06-13 DIAGNOSIS — E876 Hypokalemia: Secondary | ICD-10-CM

## 2023-06-13 DIAGNOSIS — W19XXXA Unspecified fall, initial encounter: Secondary | ICD-10-CM | POA: Diagnosis not present

## 2023-06-13 DIAGNOSIS — F17210 Nicotine dependence, cigarettes, uncomplicated: Secondary | ICD-10-CM

## 2023-06-13 LAB — COMPREHENSIVE METABOLIC PANEL
ALT: 18 U/L (ref 0–44)
AST: 14 U/L — ABNORMAL LOW (ref 15–41)
Albumin: 3.4 g/dL — ABNORMAL LOW (ref 3.5–5.0)
Alkaline Phosphatase: 66 U/L (ref 38–126)
Anion gap: 9 (ref 5–15)
BUN: 16 mg/dL (ref 8–23)
CO2: 22 mmol/L (ref 22–32)
Calcium: 8.5 mg/dL — ABNORMAL LOW (ref 8.9–10.3)
Chloride: 104 mmol/L (ref 98–111)
Creatinine, Ser: 1.61 mg/dL — ABNORMAL HIGH (ref 0.61–1.24)
GFR, Estimated: 47 mL/min — ABNORMAL LOW (ref >60)
Glucose, Bld: 148 mg/dL — ABNORMAL HIGH (ref 70–99)
Potassium: 3.8 mmol/L (ref 3.5–5.1)
Sodium: 135 mmol/L (ref 135–145)
Total Bilirubin: 0.6 mg/dL (ref <1.2)
Total Protein: 6.2 g/dL — ABNORMAL LOW (ref 6.5–8.1)

## 2023-06-13 LAB — GLUCOSE, CAPILLARY
Glucose-Capillary: 141 mg/dL — ABNORMAL HIGH (ref 70–99)
Glucose-Capillary: 152 mg/dL — ABNORMAL HIGH (ref 70–99)
Glucose-Capillary: 157 mg/dL — ABNORMAL HIGH (ref 70–99)
Glucose-Capillary: 161 mg/dL — ABNORMAL HIGH (ref 70–99)
Glucose-Capillary: 171 mg/dL — ABNORMAL HIGH (ref 70–99)

## 2023-06-13 LAB — CBC WITH DIFFERENTIAL/PLATELET
Abs Immature Granulocytes: 0.04 10*3/uL (ref 0.00–0.07)
Basophils Absolute: 0.1 10*3/uL (ref 0.0–0.1)
Basophils Relative: 1 %
Eosinophils Absolute: 0.1 10*3/uL (ref 0.0–0.5)
Eosinophils Relative: 2 %
HCT: 48 % (ref 39.0–52.0)
Hemoglobin: 15.3 g/dL (ref 13.0–17.0)
Immature Granulocytes: 1 %
Lymphocytes Relative: 21 %
Lymphs Abs: 1.5 10*3/uL (ref 0.7–4.0)
MCH: 26.7 pg (ref 26.0–34.0)
MCHC: 31.9 g/dL (ref 30.0–36.0)
MCV: 83.9 fL (ref 80.0–100.0)
Monocytes Absolute: 0.7 10*3/uL (ref 0.1–1.0)
Monocytes Relative: 9 %
Neutro Abs: 4.9 10*3/uL (ref 1.7–7.7)
Neutrophils Relative %: 66 %
Platelets: 277 10*3/uL (ref 150–400)
RBC: 5.72 MIL/uL (ref 4.22–5.81)
RDW: 15.2 % (ref 11.5–15.5)
WBC: 7.4 10*3/uL (ref 4.0–10.5)
nRBC: 0 % (ref 0.0–0.2)

## 2023-06-13 LAB — C-REACTIVE PROTEIN: CRP: 2.6 mg/dL — ABNORMAL HIGH (ref <1.0)

## 2023-06-13 LAB — MAGNESIUM: Magnesium: 2.1 mg/dL (ref 1.7–2.4)

## 2023-06-13 LAB — C-PEPTIDE: C-Peptide: 13.2 ng/mL — ABNORMAL HIGH (ref 1.1–4.4)

## 2023-06-13 MED ORDER — FREESTYLE LIBRE 3 SENSOR MISC
1.0000 | Freq: Three times a day (TID) | 3 refills | Status: DC
  Filled 2023-06-13: qty 2, 28d supply, fill #0

## 2023-06-13 MED ORDER — INSULIN ASPART 100 UNIT/ML IJ SOLN
0.0000 [IU] | Freq: Three times a day (TID) | INTRAMUSCULAR | Status: DC
  Administered 2023-06-13: 2 [IU] via SUBCUTANEOUS
  Administered 2023-06-13: 1 [IU] via SUBCUTANEOUS

## 2023-06-13 MED ORDER — FREESTYLE LIBRE 3 READER DEVI
1.0000 [IU] | Freq: Three times a day (TID) | 0 refills | Status: DC
  Filled 2023-06-13: qty 1, 90d supply, fill #0

## 2023-06-13 MED ORDER — SITAGLIPTIN PHOSPHATE 100 MG PO TABS
100.0000 mg | ORAL_TABLET | Freq: Every day | ORAL | 2 refills | Status: DC
  Filled 2023-06-13: qty 30, 30d supply, fill #0

## 2023-06-13 NOTE — TOC Benefit Eligibility Note (Signed)
Patient Product/process development scientist completed.    The patient is insured through [facility]. Patient has Medicare and is not eligible for a copay card, but may be able to apply for patient assistance, if available.    Ran test claim for Dexcom G7 Sensor and Requires Prior Authorization  Ran test claim for [employer] 3 Sensor and Requires Prior Authorization  This test claim was processed through [employer]- copay amounts may vary at other pharmacies due to [employer], or as the patient moves through the different stages of their insurance plan.     Roland Earl, CPHT Pharmacy Technician III Certified Patient Advocate [facility] Patient Advocate Team Direct Number: [phone]  Fax: [phone]

## 2023-06-13 NOTE — Progress Notes (Signed)
Mobility Specialist - Progress Note   06/13/23 1429  Mobility  Activity Ambulated with assistance in hallway  Level of Assistance Standby assist, set-up cues, supervision of patient - no hands on  Assistive Device Front wheel walker  Distance Ambulated (ft) 40 ft  Activity Response Tolerated well  Mobility Referral Yes  $Mobility charge 1 Mobility  Mobility Specialist Start Time (ACUTE ONLY) 0221  Mobility Specialist Stop Time (ACUTE ONLY) 0227  Mobility Specialist Time Calculation (min) (ACUTE ONLY) 6 min   Pt received in bed and agreeable to mobility. No complaints during session. Pt to bed after session with all needs met.    [facility]

## 2023-06-13 NOTE — Inpatient Diabetes Management (Signed)
Inpatient Diabetes Program Recommendations  AACE/ADA: New Consensus Statement on Inpatient Glycemic Control (2015)  Target Ranges:  Prepandial:   less than 140 mg/dL      Peak postprandial:   less than 180 mg/dL (1-2 hours)      Critically ill patients:  140 - 180 mg/dL   Lab Results  Component Value Date   GLUCAP 171 (H) 06/13/2023   HGBA1C 14.9 (H) 06/11/2023    Education on CGM's done. 2 Freestyle Libre 3 sensors pt will need the reader as well as his android phone is not compatible. Education on hypoglycemia s/s and treatment complete.  Thanks,  Christena Deem RN, MSN, BC-ADM Inpatient Diabetes Coordinator Team Pager [phone] (8a-5p)

## 2023-06-13 NOTE — Discharge Instructions (Addendum)
Please take all prescribed medications exactly as instructed including discontinuing your metformin and glimepiride.  Also discontinuing your Caplyta.  Instead, take Januvia 100 mg once daily Please consume a low carbohydrate diet Please check your sugar at least once daily prior to breakfast and preferably 3 times daily prior to each meal.  Use the new freestyle libre device that you were educated on prior to discharge record your blood sugars and a diary. Please increase your physical activity as tolerated. Please maintain all outpatient follow-up appointments including follow-up with your primary care provider and your behavioral health provider Dr. Donell Beers Please return to the emergency department if you develop worsening weakness, continued falls, fevers or inability to tolerate oral intake.

## 2023-06-13 NOTE — Telephone Encounter (Signed)
Pharmacy Patient Advocate Encounter   Received notification  that prior authorization for FreeStyle Libre 3 Sensor is required/requested.   Insurance verification completed.   The patient is insured through [facility] .   Per test claim: PA required; PA submitted to above mentioned insurance via CoverMyMeds Key/confirmation #/EOC [id number] Status is pending

## 2023-06-13 NOTE — TOC Progression Note (Addendum)
Transition of Care [facility]) - Progression Note    Patient Details  Name: Edwin [name] MRN: [id number] Date of Birth: 02-13-1956  Transition of Care [facility]) CM/SW Contact  Otelia Santee, LCSW Phone Number: 06/13/2023, 9:21 AM  Clinical Narrative:    Frances Furbish rescinded acceptance of pt for [facility]. CSW will continue to seek HHA to arrange HHPT for pt.   ADDENDUM: CSW unable to arrange HHPT for this pt. The following home health agencies have been contacted:  [facility]- unable to accept pt's insurance  [facility]- Unable to accept pt's insurance  [facility]- out of network with insurance  [facility]- unable to accept [facility] Unable to accept pt's insurance  [facility]- unable to accept pt's insurance as it is a dual complete [facility]- unable to accept pt's insurance   Expected Discharge Plan: Home w Home Health Services Barriers to Discharge: No Barriers Identified  Expected Discharge Plan and Services In-house Referral: Clinical Social Work Discharge Planning Services: NA Post Acute Care Choice: Home Health Living arrangements for the past 2 months: Single Family Home                           HH Arranged: PT HH Agency: [facility] Date [facility] Agency Contacted: 06/12/23 Time HH Agency Contacted: 1302 Representative spoke with at [facility] Agency: Cindie   Social Determinants of Health (SDOH) Interventions SDOH Screenings   Food Insecurity: No Food Insecurity (06/11/2023)  Housing: Low Risk  (06/11/2023)  Transportation Needs: No Transportation Needs (06/11/2023)  Utilities: Not At Risk (06/11/2023)  Social Connections: Unknown (07/05/2022)   Received from [facility]  Tobacco Use: High Risk (06/11/2023)    Readmission Risk Interventions     No data to display

## 2023-06-14 ENCOUNTER — Other Ambulatory Visit (HOSPITAL_COMMUNITY): Payer: Self-pay

## 2023-06-14 LAB — URINE CULTURE: Culture: >=100000 — AB

## 2023-06-14 LAB — GLUTAMIC ACID DECARBOXYLASE AUTO ABS: Glutamic Acid Decarb Ab: <5 U/mL (ref 0.0–5.0)

## 2023-06-14 NOTE — Telephone Encounter (Signed)
Pharmacy Patient Advocate Encounter  Received notification from [facility] that Prior Authorization for [facility] 3 Sensor has been APPROVED from 06/13/2023 to 07/29/2024. Ran test claim, Copay is $0.00. This test claim was [facility]- copay amounts may vary at other pharmacies due to pharmacy/plan contracts, or as the patient moves through the different stages of their insurance plan.   PA #/Case ID/Reference #: [id number]

## 2023-06-18 ENCOUNTER — Other Ambulatory Visit: Payer: Self-pay | Admitting: Urology

## 2023-06-19 ENCOUNTER — Other Ambulatory Visit (HOSPITAL_COMMUNITY): Payer: Self-pay

## 2023-06-20 ENCOUNTER — Ambulatory Visit (INDEPENDENT_AMBULATORY_CARE_PROVIDER_SITE_OTHER): Payer: 59 | Admitting: Podiatry

## 2023-06-20 ENCOUNTER — Encounter: Payer: Self-pay | Admitting: Podiatry

## 2023-06-20 DIAGNOSIS — M79674 Pain in right toe(s): Secondary | ICD-10-CM

## 2023-06-20 DIAGNOSIS — M79675 Pain in left toe(s): Secondary | ICD-10-CM | POA: Diagnosis not present

## 2023-06-20 DIAGNOSIS — B351 Tinea unguium: Secondary | ICD-10-CM

## 2023-06-20 NOTE — Progress Notes (Signed)
Subjective: Chief Complaint  Patient presents with   RFC    RM#12 DFC Nail trim no concerns at this time patient states he has blood sugars under control.     67 year old male presents the office today for thick, elongated nails he is not able to trim himself.  He has no new concerns today.  Does not report any open lesions.  Objective: AAO x3, NAD DP/PT pulses palpable bilaterally, CRT less than 3 seconds Some chronic appearing edema present to the right lower extremity.  There is no erythema warmth or signs of infection. No significant swelling present today bilaterally. Nails are hypertrophic, dystrophic, brittle, discolored, elongated 10. No surrounding redness or drainage. Tenderness nails 1-5 bilaterally. No open lesions or pre-ulcerative lesions are identified today.  Appears in the left hallux toe the nail has been completely back and is loose.  Dried blood present in the nailbed.  There is no ulceration. No pain with calf compression, warmth, erythema  Assessment: Symptomatic onychomycosis  Plan: -All treatment options discussed with the patient including all alternatives, risks, complications.  -Sharply debrided nails x 10 without any complications or bleeding.  On the left hallux toenail was able to debride the majority of the toenail most of it came off.  There is no signs of infection we will continue to monitor. -Encouraged compression, elevation. Chronic swelling to the leg has been stable.  Continue monitor. -Patient encouraged to call the office with any questions, concerns, change in symptoms.   Return in about 3 months (around 09/20/2023).  Vivi Barrack DPM

## 2023-06-21 DIAGNOSIS — E11649 Type 2 diabetes mellitus with hypoglycemia without coma: Secondary | ICD-10-CM

## 2023-06-21 DIAGNOSIS — F17210 Nicotine dependence, cigarettes, uncomplicated: Secondary | ICD-10-CM

## 2023-06-21 NOTE — Assessment & Plan Note (Signed)
Strict intake and output monitoring Creatinine near baseline Minimizing nephrotoxic agents as much as possible Serial chemistries to monitor renal function and electrolytes

## 2023-06-21 NOTE — Assessment & Plan Note (Signed)
Again, likely secondary to volume depletion and hyperglycemia Seemingly improving with correction of sugars and intravenous hydration PT evaluation

## 2023-06-21 NOTE — Discharge Summary (Addendum)
Physician Discharge Summary   Patient: Edwin [name] MRN: [id number] DOB: 06-03-1956  Admit date:     06/11/2023  Discharge date: 06/13/2023  Discharge Physician: Marinda Elk   PCP: [facility]   Recommendations at discharge:    Please take all prescribed medications exactly as instructed including discontinuing your metformin and glimepiride.  Also discontinuing your Caplyta.  Instead, take Januvia 100 mg once daily Please consume a low carbohydrate diet Please check your sugar at least once daily prior to breakfast and preferably 3 times daily prior to each meal.  Use the new freestyle libre device that you were educated on prior to discharge record your blood sugars and a diary. Please increase your physical activity as tolerated. Please maintain all outpatient follow-up appointments including follow-up with your primary care provider and your behavioral health provider Dr. Donell Beers Please return to the emergency department if you develop worsening weakness, continued falls, fevers or inability to tolerate oral intake.  Discharge Diagnoses: Principal Problem:   Fall at home, initial encounter Active Problems:   Chronic kidney disease, stage 3a (HCC)   Hypokalemia   Tobacco use disorder   Schizophrenia (HCC)   Weakness   Uncontrolled type 2 diabetes mellitus with hypoglycemia, without long-term current use of insulin (HCC)   Nicotine dependence, cigarettes, uncomplicated  Resolved Problems:   * No resolved hospital problems. *   Hospital Course: 67 y.o. male with medical history significant of prostate cancer s/p chronic Foley catheter, CKD 3A, HLD, anxiety and depression, and paranoid schizophrenia who presented to the ED via EMS after a fall at home.  Upon EMS arrival, they identified the patient had a blood sugar of 40.  Patient was given dextrose and was brought into [facility] emergency department for evaluation.  Upon evaluation in the emergency  department patient was found to exhibit recurrent hypoglycemia.  Hypoglycemia and volume depletion was felt to be etiologies in patient's fall.  Patient was therefore placed on dextrose infusion and hospitalist group was then called to assess the patient for admission to the hospital.  Patient was hydrated with intravenous isotonic fluids including dextrose.  Patient underwent frequent blood glucose checks.  Review of home medications revealed the patient was on metformin and glimepiride in the home setting both of which were held.  Over the course of the hospitalization patient's volume depletion and hypoglycemia resolved.  Patient and his brother both received education on appropriate management of blood sugar with her diabetic nurse educator during this hospitalization as well.  Patient also reported feelings of uneasiness, anxiety and inability to sleep over the past week ever since his psychiatric regimen was adjusted.  Medications were reviewed with psychiatry and was recommended that patient's Caplyta be discontinued for now until the patient followed up as an outpatient with his outpatient mental health provider.  At time of discharge, patient was instructed to discontinue his metformin and glimepiride going forward.  Instead, patient was instructed to take Januvia 100 mg once daily which would likely have a much lower risk of recurrent hypoglycemia.  Patient was also advised to continue to hold his Caplyta until outpatient follow-up with his behavioral health provider.  Patient was discharged home in improved and stable condition.   Consultants: None Procedures performed: None  Disposition: Home Diet recommendation:  Discharge Diet Orders (From admission, onward)     Start     Ordered   06/13/23 0000  Diet Carb Modified        06/13/23 1522  Carb modified diet  DISCHARGE MEDICATION: Allergies as of 06/13/2023   No Known Allergies      Medication List     STOP  taking these medications    benztropine 0.5 MG tablet Commonly known as: COGENTIN   Caplyta 42 MG capsule Generic drug: lumateperone tosylate   ciclopirox 8 % solution Commonly known as: Penlac   glimepiride 4 MG tablet Commonly known as: AMARYL   guaifenesin 400 MG Tabs tablet Commonly known as: HUMIBID E   ibuprofen 200 MG tablet Commonly known as: ADVIL   metFORMIN 500 MG tablet Commonly known as: GLUCOPHAGE       TAKE these medications    acetaminophen 325 MG tablet Commonly known as: TYLENOL Take 650 mg by mouth every 6 (six) hours as needed for moderate pain.   aspirin EC 81 MG tablet Take 81 mg by mouth daily.   bethanechol 5 MG tablet Commonly known as: URECHOLINE TAKE 1 TABLET (5 MG TOTAL) BY MOUTH IN THE MORNING AND AT BEDTIME   Combivent Respimat 20-100 MCG/ACT Aers respimat Generic drug: Ipratropium-Albuterol INHALE 1 PUFF INTO THE LUNGS EVERY 6 HOURS AS NEEDED FOR WHEEZING OR SHORTNESS OF BREATH. What changed: See the new instructions.   Ex-Lax 15 MG Chew Generic drug: Sennosides Chew 15 mg by mouth every 6 (six) hours as needed (for constipation).   finasteride 5 MG tablet Commonly known as: PROSCAR TAKE 1 TABLET BY MOUTH EVERY DAY   FreeStyle Libre 3 Reader Devi Use to check blood sugar 3 (three) times daily before meals.   FreeStyle Libre 3 Sensor Misc Place 1 sensor on the skin every 14 days. Use to check glucose continuously 3 times a day before meals   furosemide 20 MG tablet Commonly known as: LASIX Take 20 mg by mouth daily as needed for fluid or edema.   Januvia 100 MG tablet Generic drug: sitaGLIPtin Take 1 tablet (100 mg total) by mouth daily.   multivitamin tablet Take 1 tablet by mouth daily.   nystatin cream Commonly known as: MYCOSTATIN Apply to affected area 2 times daily   oxybutynin 5 MG tablet Commonly known as: DITROPAN Take 5 mg by mouth daily.   Tart Cherry 500 MG Caps Take 500 mg by mouth daily.    temazepam 15 MG capsule Commonly known as: RESTORIL Take 15 mg by mouth at bedtime.   Vitamin D (Ergocalciferol) 1.25 MG (50000 UNIT) Caps capsule Commonly known as: DRISDOL Take 50,000 Units by mouth every 7 (seven) days.   Vraylar 6 MG Caps Generic drug: Cariprazine HCl Take 6 mg by mouth every morning.        Follow-up Information     [facility]. Schedule an appointment as soon as possible for a visit in 1 week(s).   Specialty: Internal Medicine Contact information: [address] [place] [address] [phone]         Archer Asa, MD Follow up.   Specialty: Psychiatry Contact information: [address] [place] [address] [phone]                 Discharge Exam: Ceasar Mons Weights   06/11/23 2110  Weight: 85.9 kg    Constitutional: Awake alert and oriented x3, no associated distress.   Respiratory: clear to auscultation bilaterally, no wheezing, no crackles. Normal respiratory effort. No accessory muscle use.  Cardiovascular: Regular rate and rhythm, no murmurs / rubs / gallops. No extremity edema. 2+ pedal pulses. No carotid bruits.  Abdomen: Abdomen is soft  and nontender.  No evidence of intra-abdominal masses.  Positive bowel sounds noted in all quadrants.   Musculoskeletal: No joint deformity upper and lower extremities. Good ROM, no contractures. Normal muscle tone.     Condition at discharge: fair  The results of significant diagnostics from this hospitalization (including imaging, microbiology, ancillary and laboratory) are listed below for reference.   Imaging Studies: CT CHEST ABDOMEN PELVIS WO CONTRAST  Result Date: 06/11/2023 CLINICAL DATA:  Found on the ground. Unable to get of. Hypoglycemia. Abdominal distension. EXAM: CT CHEST, ABDOMEN AND PELVIS WITHOUT CONTRAST TECHNIQUE: Multidetector CT imaging of the chest, abdomen and pelvis was performed following the standard protocol without IV contrast. RADIATION  DOSE REDUCTION: This exam was performed according to the departmental dose-optimization program which includes automated exposure control, adjustment of the mA and/or kV according to patient size and/or use of iterative reconstruction technique. COMPARISON:  07/06/2022.  11/08/2021. FINDINGS: CT CHEST FINDINGS Cardiovascular: Heart size is normal. No significant pericardial fluid. Minimal thoracic aortic atherosclerotic calcification. No coronary artery calcification. Mediastinum/Nodes: No mediastinal or hilar mass or lymphadenopathy. Lungs/Pleura: No pleural effusion. Minimal patchy atelectasis or scarring in the left lower lobe. No mass or nodule. No emphysematous. Musculoskeletal: No spinal or rib fracture. Chronic bridging osteophytes in the thoracic spine. CT ABDOMEN PELVIS FINDINGS Hepatobiliary: Liver appears normal without contrast. No calcified gallstones. Pancreas: Normal Spleen: Normal Adrenals/Urinary Tract: Adrenal glands are normal. Chronic hydronephrosis on the right with a prominent extrarenal pelvis. The ureter is dilated all the way to the bladder. No stone disease on the right. On the left, there is hydroureteronephrosis with several nonobstructing stones including a few in the dilated renal pelvis, the largest measuring up to 9 mm. No stone seen along the course of the dilated ureter to the bladder. There is a catheter in the bladder. The bladder shows chronic wall thickening. Stomach/Bowel: Stomach and small intestine are unremarkable. Normal appendix. No acute or significant: Finding. Vascular/Lymphatic: Aortic atherosclerosis.  No aneurysm. Reproductive: Normal Other: No free fluid or air. Musculoskeletal: No acute finding. 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 IMPRESSION: 1. No acute chest finding. 2. Chronic hydronephrosis on the right with a prominent extrarenal pelvis. The ureter is dilated all the way to the bladder. No stone disease on the right. 3. Chronic  hydroureteronephrosis on the left with several nonobstructing stones including a few in the dilated renal pelvis, the largest measuring up to 9 mm. No stone seen along the course of the dilated ureter to the bladder. 4. Chronic bladder wall thickening. Catheter in the bladder. 5. Aortic atherosclerosis, very minimal. 6. 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 Aortic Atherosclerosis (ICD10-I70.0). Electronically Signed   By: Paulina Fusi M.D.   On: 06/11/2023 17:06   DG Chest Port 1 View  Result Date: 06/11/2023 CLINICAL DATA:  Weakness.  Hypoglycemia. EXAM: PORTABLE CHEST 1 VIEW COMPARISON:  07/05/2022 FINDINGS: Poor inspiration. Allowing for that, the heart and mediastinum are normal in the lungs are clear. No evidence of heart failure or pneumonia. No visible effusion. No abnormal bone finding. IMPRESSION: Poor inspiration. No active disease. Electronically Signed   By: Paulina Fusi M.D.   On: 06/11/2023 17:01   CT Cervical Spine Wo Contrast  Result Date: 06/11/2023 CLINICAL DATA:  Larey Seat.  Found on the ground unable to get up. EXAM: CT CERVICAL SPINE WITHOUT CONTRAST TECHNIQUE: Multidetector CT imaging of the cervical spine was performed without intravenous contrast. Multiplanar CT image reconstructions were also generated. RADIATION  DOSE REDUCTION: This exam was performed according to the departmental dose-optimization program which includes automated exposure control, adjustment of the mA and/or kV according to patient size and/or use of iterative reconstruction technique. COMPARISON:  01/13/2015 FINDINGS: Alignment: No malalignment. Skull base and vertebrae: No fracture or focal bone lesion. Distant ACDF C4-5. Prominent anterior osteophytes at C3-4, C5-6, C6-7 and C7-T1. Soft tissues and spinal canal: No soft tissue injury. Disc levels: The foramen magnum is widely patent. There is ordinary mild osteoarthritis of the C1-2 articulation but no encroachment upon the neural structures.  C2-3: Small central calcified disc protrusion. No significant crow chin upon the canal. No foraminal stenosis. C3-4: Bulging of the disc.  No stenosis. C4-5: Previous ACDF with solid union and wide patency of the canal and foramina. C5-6: Solid bridging anterior osteophytes. Posterior osteophytes on the right. Bilateral bony foraminal stenosis could affect either C6 nerve. C6-7: Prominent anterior osteophytes. No canal or foraminal stenosis. C7-T1: Prominent anterior osteophytes. No canal or foraminal stenosis. Upper chest: Negative Other: None IMPRESSION: 1. No acute or traumatic finding. Distant ACDF at C4-5 with solid union and wide patency of the canal and foramina. 2. Solid bridging anterior osteophytes at C5-6. Posterior osteophytes on the right. Bilateral bony foraminal stenosis could affect either C6 nerve. 3. Prominent anterior osteophytes at C6-7 and C7-T1. Electronically Signed   By: Paulina Fusi M.D.   On: 06/11/2023 17:00   CT Head Wo Contrast  Result Date: 06/11/2023 CLINICAL DATA:  Head trauma, minor. Unable to get up from the ground. Hypoglycemia. EXAM: CT HEAD WITHOUT CONTRAST TECHNIQUE: Contiguous axial images were obtained from the base of the skull through the vertex without intravenous contrast. RADIATION DOSE REDUCTION: This exam was performed according to the departmental dose-optimization program which includes automated exposure control, adjustment of the mA and/or kV according to patient size and/or use of iterative reconstruction technique. COMPARISON:  08/10/2017 FINDINGS: Brain: The brain shows a normal appearance without evidence of malformation, atrophy, old or acute small or large vessel infarction, mass lesion, hemorrhage, hydrocephalus or extra-axial collection. Vascular: No hyperdense vessel. No evidence of atherosclerotic calcification. Skull: Normal.  No traumatic finding.  No focal bone lesion. Sinuses/Orbits: Sinuses are clear. Orbits appear normal. Mastoids are clear.  Other: None significant IMPRESSION: Normal head CT. Electronically Signed   By: Paulina Fusi M.D.   On: 06/11/2023 16:57    Microbiology: Results for orders placed or performed during the hospital encounter of 06/11/23  Urine Culture     Status: Abnormal   Collection Time: 06/11/23  4:03 PM   Specimen: Urine, Catheterized  Result Value Ref Range Status   Specimen Description   Final    URINE, CATHETERIZED Performed at [facility], [address] [place], [place] [address]    Special Requests   Final    NONE Performed at [facility], [address], [place], [place] [address]    Culture >=100,000 COLONIES/mL STENOTROPHOMONAS MALTOPHILIA (A)  Final   Report Status 06/14/2023 FINAL  Final   Organism ID, Bacteria STENOTROPHOMONAS MALTOPHILIA (A)  Final      Susceptibility   Stenotrophomonas maltophilia - MIC*    LEVOFLOXACIN 0.5 SENSITIVE Sensitive     TRIMETH/SULFA <=20 SENSITIVE Sensitive     * >=100,000 COLONIES/mL STENOTROPHOMONAS MALTOPHILIA    Labs: CBC: No results for input(s): "WBC", "NEUTROABS", "HGB", "HCT", "MCV", "PLT" in the last 168 hours. Basic Metabolic Panel: No results for input(s): "NA", "K", "CL", "CO2", "GLUCOSE", "BUN", "CREATININE", "CALCIUM", "MG", "  PHOS" in the last 168 hours. Liver Function Tests: No results for input(s): "AST", "ALT", "ALKPHOS", "BILITOT", "PROT", "ALBUMIN" in the last 168 hours. CBG: No results for input(s): "GLUCAP" in the last 168 hours.  Discharge time spent: greater than 30 minutes.  Signed: Marinda Elk, MD [facility] 06/13/2023

## 2023-06-21 NOTE — Assessment & Plan Note (Signed)
Marland Kitchen  Patient is being counseled daily on smoking cessation. . Providing patient with nicotine replacement therapy during this hospitalization.

## 2023-06-21 NOTE — Assessment & Plan Note (Signed)
Currently holding metformin and glimepiride Frequent Accu-Cheks Diabetic nurse educator evaluation Patient may benefit from transitioning to other oral hypoglycemics that are less likely to cause hypoglycemia prior to discharge

## 2023-06-21 NOTE — Assessment & Plan Note (Signed)
Replacing with potassium chloride  Evaluating for concurrent hypomagnesemia   Monitoring potassium levels with serial chemistries.

## 2023-06-21 NOTE — Assessment & Plan Note (Signed)
Likely related to volume depletion and hyperglycemia due to current hypoglycemic regimen Attempted to transition off of intravenous fluids, encouraging oral Attempting to ambulate PT evaluation

## 2023-06-21 NOTE — Assessment & Plan Note (Signed)
Patient reporting several days of not being able to sleep since his psychiatric regimen was adjusted by his outpatient psychiatrist Will discuss with psychiatry while here, adjust regimen per the recommendations

## 2023-06-25 LAB — INSULIN ANTIBODIES, BLOOD: Insulin Antibodies, Human: <5 uU/mL

## 2023-06-28 ENCOUNTER — Other Ambulatory Visit (HOSPITAL_COMMUNITY): Payer: Self-pay

## 2023-08-25 ENCOUNTER — Ambulatory Visit (INDEPENDENT_AMBULATORY_CARE_PROVIDER_SITE_OTHER): Payer: 59

## 2023-08-25 ENCOUNTER — Encounter (HOSPITAL_COMMUNITY): Payer: Self-pay

## 2023-08-25 ENCOUNTER — Encounter (HOSPITAL_COMMUNITY): Payer: Self-pay | Admitting: Internal Medicine

## 2023-08-25 ENCOUNTER — Ambulatory Visit (HOSPITAL_COMMUNITY)
Admission: EM | Admit: 2023-08-25 | Discharge: 2023-08-25 | Disposition: A | Discharge status: Home / Self Care | Payer: 59 | Attending: Internal Medicine | Admitting: Internal Medicine

## 2023-08-25 DIAGNOSIS — R053 Chronic cough: Secondary | ICD-10-CM

## 2023-08-25 NOTE — Discharge Instructions (Addendum)
Chest x-ray done today and when compared to x-ray done in November, there does not appear to be any significant changes.  The final read from the radiologist will be available later today.  At this point given that there are no other significant changes, no further treatment is needed.  Continue to monitor symptoms and if any worsening can return to urgent care or go to primary care physician.

## 2023-08-25 NOTE — ED Triage Notes (Signed)
Patient reports that he has had a non productive cough x 1 week. Patient also c/o lower abdominal pain since this AM. Patient reports a history of prostate cancer. Patient's family came with the patient and stated, "We want him to have a chest x-ray.  Patient denies taking any medication for the abdominal pain or cough.

## 2023-08-25 NOTE — ED Provider Notes (Signed)
[facility]    CSN: [id number] Arrival date & time: 08/25/23  1434      History   Chief Complaint Chief Complaint  Patient presents with   Cough   Abdominal Pain    HPI Edwin [name] is a 68 y.o. male.   68 year old male who presents to urgent care with his family with complaints of cough.  1 family member in the room says that he has heard him coughing at night and it sounds like he is strangling.  He reports that it sounds like he is coughing stuff up.  The other family member in the room who lives with him reports that this is the way he has been for years and that the cough is chronic in nature.  The patient denies any fevers, shortness of breath, new or worsening of his cough, congestion or sick exposures.  He also had some mild abdominal pain this morning that resolved after he had a bowel movement.  He reports that this does happen when he gets constipated.  He denies nausea or vomiting.  His family reports he is eating and drinking normally.  He does have an indwelling catheter from prostate cancer but his urine is clear without sedimentation or odor.  He was in the hospital in November and had a chest x-ray and a CT of the chest.  The CT of the chest did not show any acute findings.  He was just at his doctor's office 4 days ago and they listen to his lungs at that time and did not indicate any new issues at that time.   Cough Associated symptoms: no chest pain, no chills, no ear pain, no fever, no rash, no shortness of breath and no sore throat   Abdominal Pain Associated symptoms: cough   Associated symptoms: no chest pain, no chills, no dysuria, no fever, no hematuria, no shortness of breath, no sore throat and no vomiting     Past Medical History:  Diagnosis Date   Anxiety    Cancer (HCC)    prostate   CKD (chronic kidney disease) stage 3, GFR 30-59 ml/min (HCC)    Depression    GERD (gastroesophageal reflux disease)    under control   History of  Clostridium difficile colitis    03-31-2015--  resolved   History of sepsis    secondary to UTI and ARF  x2 admission 03-31-2015 &  04-14-2015   Hyperlipidemia    Paranoid schizophrenia (HCC)    Urinary incontinence     Patient Active Problem List   Diagnosis Date Noted   Uncontrolled type 2 diabetes mellitus with hypoglycemia, without long-term current use of insulin (HCC) 06/21/2023   Nicotine dependence, cigarettes, uncomplicated 06/21/2023   Weakness 06/12/2023   Fall at home, initial encounter 06/11/2023   Schizophrenia (HCC) 07/06/2022   Restrictive lung disease 02/01/2022   Bilateral lower extremity edema 02/01/2022   Tobacco use disorder 02/01/2022   Severe sepsis (HCC) 11/08/2021   Chronic kidney disease, stage 3a (HCC) 11/08/2021   History of prostate cancer 11/08/2021   Hypokalemia 11/08/2021   Hypomagnesemia 11/08/2021   Hyperglycemia 11/08/2021   Chronic cough    Hypothermia 08/10/2017   Leukocytosis 08/10/2017   Hyponatremia 08/10/2017   BPH (benign prostatic hyperplasia) 06/27/2015   Malnutrition of moderate degree 06/13/2015   Lower urinary tract infectious disease 06/10/2015   Enteritis due to Clostridium difficile 04/04/2015   Sepsis (HCC) 03/31/2015   Acute encephalopathy 03/31/2015   Sepsis secondary to  UTI (HCC) 01/13/2015   Paranoid schizophrenia, chronic condition (HCC) 01/13/2015   AKI (acute kidney injury) (HCC) 01/13/2015   Rhabdomyolysis 01/13/2015   HLD (hyperlipidemia) 01/13/2015    Past Surgical History:  Procedure Laterality Date   CERVICAL DISC SURGERY  1990's   CYSTOSCOPY N/A 03/08/2015   Procedure: CYSTOSCOPY;  Surgeon: Su Grand, MD;  Location: [facility];  Service: Urology;  Laterality: N/A;   CYSTOSCOPY  06/10/2015   Procedure: CYSTOSCOPY;  Surgeon: Malen Gauze, MD;  Location: [facility];  Service: Urology;;   PROSTATE BIOPSY N/A 03/08/2015   Procedure: BIOPSY TRANSRECTAL ULTRASONIC PROSTATE (TUBP);  Surgeon: Su Grand, MD;  Location: [facility];  Service: Urology;  Laterality: N/A;   TRANSTHORACIC ECHOCARDIOGRAM  06-11-2015   grade 1 diastolic dysfunction, ef 55-60%;  trivial TR   TRANSURETHRAL RESECTION OF BLADDER TUMOR N/A 03/08/2015   Procedure: TRANSURETHRAL RESECTION OF BLADDER TUMOR (TURBT);  Surgeon: Su Grand, MD;  Location: [facility];  Service: Urology;  Laterality: N/A;  TUR BIOPSY BLADDER   TRANSURETHRAL RESECTION OF PROSTATE N/A 06/27/2015   Procedure: TRANSURETHRAL RESECTION OF THE PROSTATE WITH GYRUS INSTRUMENTS;  Surgeon: Malen Gauze, MD;  Location: [facility];  Service: Urology;  Laterality: N/A;   TRANSURETHRAL RESECTION OF PROSTATE N/A 07/09/2016   Procedure: TRANSURETHRAL RESECTION OF THE PROSTATE (TURP);  Surgeon: Malen Gauze, MD;  Location: [facility];  Service: Urology;  Laterality: N/A;       Home Medications    Prior to Admission medications   Medication Sig Start Date End Date Taking? Authorizing Provider  acetaminophen (TYLENOL) 325 MG tablet Take 650 mg by mouth every 6 (six) hours as needed for moderate pain.    [provider]  aspirin EC 81 MG tablet Take 81 mg by mouth daily.    [provider]  bethanechol (URECHOLINE) 5 MG tablet TAKE 1 TABLET (5 MG TOTAL) BY MOUTH IN THE MORNING AND AT BEDTIME 06/04/23   McKenzie, Mardene Celeste, MD  Continuous Glucose Receiver (FREESTYLE LIBRE 3 READER) DEVI Use to check blood sugar 3 (three) times daily before meals. 06/13/23   Shalhoub, Deno Lunger, MD  Continuous Glucose Sensor (FREESTYLE LIBRE 3 SENSOR) MISC Place 1 sensor on the skin every 14 days. Use to check glucose continuously 3 times a day before meals 06/13/23   Shalhoub, Deno Lunger, MD  EX-LAX 15 MG CHEW Chew 15 mg by mouth every 6 (six) hours as needed (for constipation).    [provider]  finasteride (PROSCAR) 5 MG tablet TAKE 1 TABLET BY MOUTH EVERY DAY Patient taking differently: Take 5 mg by mouth daily.  05/17/20   McKenzie, Mardene Celeste, MD  furosemide (LASIX) 20 MG tablet Take 20 mg by mouth daily as needed for fluid or edema. 05/11/22   [provider]  Ipratropium-Albuterol (COMBIVENT RESPIMAT) 20-100 MCG/ACT AERS respimat INHALE 1 PUFF INTO THE LUNGS EVERY 6 HOURS AS NEEDED FOR WHEEZING OR SHORTNESS OF BREATH. Patient taking differently: Inhale 1 puff into the lungs every 6 (six) hours as needed for wheezing or shortness of breath. 06/04/22   Coralyn Helling, MD  Multiple Vitamin (MULTIVITAMIN) tablet Take 1 tablet by mouth daily.    [provider]  nystatin cream (MYCOSTATIN) Apply to affected area 2 times daily 05/05/23   Rondel Baton, MD  oxybutynin (DITROPAN) 5 MG tablet Take 5 mg by mouth daily.    [provider]  sitaGLIPtin (JANUVIA) 100 MG tablet Take 1  tablet (100 mg total) by mouth daily. 06/13/23   Shalhoub, Deno Lunger, MD  tamsulosin (FLOMAX) 0.4 MG CAPS capsule TAKE 1 CAPSULE BY MOUTH TWICE A DAY 06/18/23   McKenzie, Mardene Celeste, MD  Tart Cherry 500 MG CAPS Take 500 mg by mouth daily.    [provider]  temazepam (RESTORIL) 15 MG capsule Take 15 mg by mouth at bedtime. 06/14/16   [provider]  Vitamin D, Ergocalciferol, (DRISDOL) 1.25 MG (50000 UNIT) CAPS capsule Take 50,000 Units by mouth every 7 (seven) days.    [provider]  VRAYLAR 6 MG CAPS Take 6 mg by mouth every morning. 10/24/21   [provider]    Family History Family History  Problem Relation Age of Onset   Breast cancer Mother     Social History Social History   Tobacco Use   Smoking status: Every Day    Current packs/day: 1.00    Average packs/day: 1 pack/day for 40.0 years (40.0 ttl pk-yrs)    Types: Cigarettes   Smokeless tobacco: Never   Tobacco comments:    Smoke 7 packs of cigarettes a week. 03/21/22 Tay  Vaping Use   Vaping status: Never Used  Substance Use Topics   Alcohol use: No   Drug use: No     Allergies   Patient has  no known allergies.   Review of Systems Review of Systems  Constitutional:  Negative for chills and fever.  HENT:  Negative for ear pain and sore throat.   Eyes:  Negative for pain and visual disturbance.  Respiratory:  Positive for cough. Negative for shortness of breath.   Cardiovascular:  Negative for chest pain and palpitations.  Gastrointestinal:  Positive for abdominal pain (Resolved after bowel movement). Negative for vomiting.  Genitourinary:  Negative for dysuria and hematuria.  Musculoskeletal:  Negative for arthralgias and back pain.  Skin:  Negative for color change and rash.  Neurological:  Negative for seizures and syncope.  All other systems reviewed and are negative.    Physical Exam Triage Vital Signs ED Triage Vitals [08/25/23 1612]  Encounter Vitals Group     BP 94/60     Systolic BP Percentile      Diastolic BP Percentile      Pulse Rate 96     Resp 16     Temp 97.9 F (36.6 C)     Temp Source Oral     SpO2 93 %     Weight      Height      Head Circumference      Peak Flow      Pain Score 2     Pain Loc      Pain Education      Exclude from Growth Chart    No data found.  Updated Vital Signs BP 94/60 (BP Location: Left Arm)   Pulse 96   Temp 97.9 F (36.6 C) (Oral)   Resp 16   SpO2 93%   Visual Acuity Right Eye Distance:   Left Eye Distance:   Bilateral Distance:    Right Eye Near:   Left Eye Near:    Bilateral Near:     Physical Exam Vitals and nursing note reviewed.  Constitutional:      General: He is not in acute distress.    Appearance: He is well-developed.  HENT:     Head: Normocephalic and atraumatic.  Eyes:     Conjunctiva/sclera: Conjunctivae normal.  Cardiovascular:  Rate and Rhythm: Normal rate and regular rhythm.     Heart sounds: No murmur heard. Pulmonary:     Effort: Pulmonary effort is normal. No respiratory distress.     Breath sounds: Examination of the left-upper field reveals rhonchi. Examination of  the right-middle field reveals decreased breath sounds. Examination of the left-middle field reveals decreased breath sounds and rhonchi. Examination of the right-lower field reveals decreased breath sounds. Examination of the left-lower field reveals decreased breath sounds. Decreased breath sounds and rhonchi present. No wheezing.  Abdominal:     General: Bowel sounds are normal.     Palpations: Abdomen is soft.     Tenderness: There is no abdominal tenderness.  Musculoskeletal:        General: No swelling.     Cervical back: Neck supple.  Skin:    General: Skin is warm and dry.     Capillary Refill: Capillary refill takes less than 2 seconds.  Neurological:     Mental Status: He is alert.  Psychiatric:        Mood and Affect: Mood normal.      UC Treatments / Results  Labs (all labs ordered are listed, but only abnormal results are displayed) Labs Reviewed - No data to display  EKG   Radiology No results found.  Procedures Procedures (including critical care time)  Medications Ordered in UC Medications - No data to display  Initial Impression / Assessment and Plan / UC Course  I have reviewed the triage vital signs and the nursing notes.  Pertinent labs & imaging results that were available during my care of the patient were reviewed by me and considered in my medical decision making (see chart for details).     Chronic cough - Plan: DG Chest 2 View, DG Chest 2 View   Chest x-ray done today and when compared to x-ray done in November, there does not appear to be any significant changes.  The final read from the radiologist will be available later today.  At this point given that there are no other significant changes, no further treatment is needed.  Continue to monitor symptoms and if any worsening can return to urgent care or go to primary care physician.  Final Clinical Impressions(s) / UC Diagnoses   Final diagnoses:  Chronic cough   Discharge Instructions    None    ED Prescriptions   None    PDMP not reviewed this encounter.   Landis Martins, [place] 08/25/23 1715

## 2023-09-24 ENCOUNTER — Inpatient Hospital Stay (HOSPITAL_COMMUNITY)
Admission: EM | Admit: 2023-09-24 | Discharge: 2023-09-26 | DRG: 638 | Disposition: A | Discharge status: Home / Self Care | Payer: 59 | Attending: Family Medicine | Admitting: Family Medicine

## 2023-09-24 ENCOUNTER — Encounter (HOSPITAL_COMMUNITY): Payer: Self-pay

## 2023-09-24 ENCOUNTER — Emergency Department (HOSPITAL_COMMUNITY): Payer: 59

## 2023-09-24 ENCOUNTER — Other Ambulatory Visit: Payer: Self-pay

## 2023-09-24 DIAGNOSIS — N1831 Chronic kidney disease, stage 3a: Secondary | ICD-10-CM | POA: Diagnosis present

## 2023-09-24 DIAGNOSIS — F32A Depression, unspecified: Secondary | ICD-10-CM | POA: Diagnosis present

## 2023-09-24 DIAGNOSIS — E162 Hypoglycemia, unspecified: Secondary | ICD-10-CM | POA: Diagnosis present

## 2023-09-24 DIAGNOSIS — Z8546 Personal history of malignant neoplasm of prostate: Secondary | ICD-10-CM | POA: Diagnosis not present

## 2023-09-24 DIAGNOSIS — E1122 Type 2 diabetes mellitus with diabetic chronic kidney disease: Secondary | ICD-10-CM | POA: Diagnosis present

## 2023-09-24 DIAGNOSIS — W19XXXA Unspecified fall, initial encounter: Secondary | ICD-10-CM | POA: Diagnosis not present

## 2023-09-24 DIAGNOSIS — F17210 Nicotine dependence, cigarettes, uncomplicated: Secondary | ICD-10-CM | POA: Diagnosis present

## 2023-09-24 DIAGNOSIS — Z803 Family history of malignant neoplasm of breast: Secondary | ICD-10-CM | POA: Diagnosis not present

## 2023-09-24 DIAGNOSIS — Y92009 Unspecified place in unspecified non-institutional (private) residence as the place of occurrence of the external cause: Secondary | ICD-10-CM

## 2023-09-24 DIAGNOSIS — E11649 Type 2 diabetes mellitus with hypoglycemia without coma: Secondary | ICD-10-CM | POA: Diagnosis present

## 2023-09-24 DIAGNOSIS — F200 Paranoid schizophrenia: Secondary | ICD-10-CM | POA: Diagnosis present

## 2023-09-24 DIAGNOSIS — E785 Hyperlipidemia, unspecified: Secondary | ICD-10-CM | POA: Diagnosis present

## 2023-09-24 DIAGNOSIS — Z7984 Long term (current) use of oral hypoglycemic drugs: Secondary | ICD-10-CM | POA: Diagnosis not present

## 2023-09-24 DIAGNOSIS — E876 Hypokalemia: Secondary | ICD-10-CM | POA: Diagnosis present

## 2023-09-24 DIAGNOSIS — Z23 Encounter for immunization: Secondary | ICD-10-CM | POA: Diagnosis present

## 2023-09-24 DIAGNOSIS — Z79899 Other long term (current) drug therapy: Secondary | ICD-10-CM

## 2023-09-24 DIAGNOSIS — K219 Gastro-esophageal reflux disease without esophagitis: Secondary | ICD-10-CM | POA: Diagnosis present

## 2023-09-24 DIAGNOSIS — F209 Schizophrenia, unspecified: Secondary | ICD-10-CM | POA: Diagnosis present

## 2023-09-24 DIAGNOSIS — Z7982 Long term (current) use of aspirin: Secondary | ICD-10-CM

## 2023-09-24 LAB — COMPREHENSIVE METABOLIC PANEL
ALT: 9 U/L (ref 0–44)
AST: 12 U/L — ABNORMAL LOW (ref 15–41)
Albumin: 3.2 g/dL — ABNORMAL LOW (ref 3.5–5.0)
Alkaline Phosphatase: 65 U/L (ref 38–126)
Anion gap: 12 (ref 5–15)
BUN: 18 mg/dL (ref 8–23)
CO2: 25 mmol/L (ref 22–32)
Calcium: 8.7 mg/dL — ABNORMAL LOW (ref 8.9–10.3)
Chloride: 95 mmol/L — ABNORMAL LOW (ref 98–111)
Creatinine, Ser: 1.58 mg/dL — ABNORMAL HIGH (ref 0.61–1.24)
GFR, Estimated: 48 mL/min — ABNORMAL LOW (ref >60)
Glucose, Bld: 57 mg/dL — ABNORMAL LOW (ref 70–99)
Potassium: 3.1 mmol/L — ABNORMAL LOW (ref 3.5–5.1)
Sodium: 132 mmol/L — ABNORMAL LOW (ref 135–145)
Total Bilirubin: 0.7 mg/dL (ref 0.0–1.2)
Total Protein: 7.1 g/dL (ref 6.5–8.1)

## 2023-09-24 LAB — CBC
HCT: 38.3 % — ABNORMAL LOW (ref 39.0–52.0)
Hemoglobin: 12.3 g/dL — ABNORMAL LOW (ref 13.0–17.0)
MCH: 25.5 pg — ABNORMAL LOW (ref 26.0–34.0)
MCHC: 32.1 g/dL (ref 30.0–36.0)
MCV: 79.3 fL — ABNORMAL LOW (ref 80.0–100.0)
Platelets: 303 10*3/uL (ref 150–400)
RBC: 4.83 MIL/uL (ref 4.22–5.81)
RDW: 15.5 % (ref 11.5–15.5)
WBC: 13.1 10*3/uL — ABNORMAL HIGH (ref 4.0–10.5)
nRBC: 0 % (ref 0.0–0.2)

## 2023-09-24 LAB — URINALYSIS, ROUTINE W REFLEX MICROSCOPIC
Bilirubin Urine: NEGATIVE
Glucose, UA: NEGATIVE mg/dL
Ketones, ur: NEGATIVE mg/dL
Nitrite: NEGATIVE
Protein, ur: NEGATIVE mg/dL
Specific Gravity, Urine: 1.001 — ABNORMAL LOW (ref 1.005–1.030)
pH: 7 (ref 5.0–8.0)

## 2023-09-24 LAB — CBG MONITORING, ED
Glucose-Capillary: 68 mg/dL — ABNORMAL LOW (ref 70–99)
Glucose-Capillary: 33 mg/dL — CL (ref 70–99)
Glucose-Capillary: 101 mg/dL — ABNORMAL HIGH (ref 70–99)

## 2023-09-24 LAB — CK: Total CK: 79 U/L (ref 49–397)

## 2023-09-24 MED ORDER — ACETAMINOPHEN 325 MG PO TABS: 650.0000 mg | ORAL_TABLET | Freq: Four times a day (QID) | ORAL | Status: DC | PRN

## 2023-09-24 MED ORDER — INSULIN ASPART 100 UNIT/ML IJ SOLN
0.0000 [IU] | INTRAMUSCULAR | Status: DC
  Filled 2023-09-24: qty 0.06

## 2023-09-24 MED ORDER — IPRATROPIUM-ALBUTEROL 0.5-2.5 (3) MG/3ML IN SOLN: 3.0000 mL | Freq: Four times a day (QID) | RESPIRATORY_TRACT | Status: DC | PRN

## 2023-09-24 MED ORDER — SODIUM CHLORIDE 0.9 % IV SOLN
250.0000 mL | INTRAVENOUS | Status: DC | PRN
Start: 2023-09-24 — End: 2023-09-24

## 2023-09-24 MED ORDER — IPRATROPIUM-ALBUTEROL 20-100 MCG/ACT IN AERS: 1.0000 | INHALATION_SPRAY | Freq: Four times a day (QID) | RESPIRATORY_TRACT | Status: DC | PRN

## 2023-09-24 MED ORDER — ENOXAPARIN SODIUM 40 MG/0.4ML IJ SOSY
40.0000 mg | PREFILLED_SYRINGE | INTRAMUSCULAR | Status: DC
  Administered 2023-09-25 – 2023-09-26 (×2): 40 mg via SUBCUTANEOUS
  Filled 2023-09-24 (×2): qty 0.4

## 2023-09-24 MED ORDER — SODIUM CHLORIDE 0.9% FLUSH: 3.0000 mL | INTRAVENOUS | Status: DC | PRN

## 2023-09-24 MED ORDER — BETHANECHOL CHLORIDE 10 MG PO TABS
5.0000 mg | ORAL_TABLET | Freq: Two times a day (BID) | ORAL | Status: DC
  Administered 2023-09-25 – 2023-09-26 (×4): 5 mg via ORAL
  Filled 2023-09-24 (×5): qty 0.5

## 2023-09-24 MED ORDER — TEMAZEPAM 15 MG PO CAPS
30.0000 mg | ORAL_CAPSULE | Freq: Every day | ORAL | Status: DC
  Administered 2023-09-25 (×2): 30 mg via ORAL
  Filled 2023-09-24 (×2): qty 2

## 2023-09-24 MED ORDER — SODIUM CHLORIDE 0.9% FLUSH
3.0000 mL | Freq: Two times a day (BID) | INTRAVENOUS | Status: DC
  Administered 2023-09-25 – 2023-09-26 (×4): 3 mL via INTRAVENOUS

## 2023-09-24 MED ORDER — TAMSULOSIN HCL 0.4 MG PO CAPS
0.4000 mg | ORAL_CAPSULE | Freq: Two times a day (BID) | ORAL | Status: DC
  Administered 2023-09-25 – 2023-09-26 (×4): 0.4 mg via ORAL
  Filled 2023-09-24 (×4): qty 1

## 2023-09-24 MED ORDER — DEXTROSE 10 % IV SOLN: INTRAVENOUS | Status: DC

## 2023-09-24 MED ORDER — SENNOSIDES-DOCUSATE SODIUM 8.6-50 MG PO TABS
2.0000 | ORAL_TABLET | Freq: Two times a day (BID) | ORAL | Status: DC
  Administered 2023-09-25 – 2023-09-26 (×4): 2 via ORAL
  Filled 2023-09-24 (×4): qty 2

## 2023-09-24 MED ORDER — DEXTROSE 50 % IV SOLN
INTRAVENOUS | Status: AC
  Filled 2023-09-24: qty 50

## 2023-09-24 MED ORDER — SODIUM CHLORIDE 0.9% FLUSH: 3.0000 mL | Freq: Two times a day (BID) | INTRAVENOUS | Status: DC

## 2023-09-24 MED ORDER — FINASTERIDE 5 MG PO TABS
5.0000 mg | ORAL_TABLET | Freq: Every day | ORAL | Status: DC
  Administered 2023-09-25 – 2023-09-26 (×2): 5 mg via ORAL
  Filled 2023-09-24 (×2): qty 1

## 2023-09-24 MED ORDER — DEXTROSE 50 % IV SOLN
1.0000 | Freq: Once | INTRAVENOUS | Status: AC
  Administered 2023-09-25: 50 mL via INTRAVENOUS
  Filled 2023-09-24: qty 50

## 2023-09-24 MED ORDER — LUMATEPERONE TOSYLATE 42 MG PO CAPS
42.0000 mg | ORAL_CAPSULE | Freq: Every morning | ORAL | Status: DC
  Administered 2023-09-25 – 2023-09-26 (×2): 42 mg via ORAL
  Filled 2023-09-24 (×2): qty 1

## 2023-09-24 MED ORDER — POTASSIUM CHLORIDE CRYS ER 20 MEQ PO TBCR
40.0000 meq | EXTENDED_RELEASE_TABLET | Freq: Once | ORAL | Status: AC
  Administered 2023-09-24: 40 meq via ORAL
  Filled 2023-09-24: qty 2

## 2023-09-24 MED ORDER — POLYETHYLENE GLYCOL 3350 17 G PO PACK: 17.0000 g | PACK | Freq: Every day | ORAL | Status: DC | PRN

## 2023-09-24 MED ORDER — ACETAMINOPHEN 650 MG RE SUPP: 650.0000 mg | Freq: Four times a day (QID) | RECTAL | Status: DC | PRN

## 2023-09-24 MED ORDER — CARIPRAZINE HCL 1.5 MG PO CAPS
6.0000 mg | ORAL_CAPSULE | Freq: Every day | ORAL | Status: DC
  Administered 2023-09-25: 6 mg via ORAL
  Filled 2023-09-24: qty 4

## 2023-09-24 NOTE — H&P (Signed)
 History and Physical    Patient: Edwin [name] UVO:[id number] DOB: 1956-03-26 DOA: 09/24/2023 DOS: the patient was seen and examined on 09/24/2023 PCP: [facility], [facility]  Patient coming from: Home  Chief Complaint:  Chief Complaint  Patient presents with   Hypoglycemia   HPI: Edwin [name] is a 68 y.o. male with medical history significant of schizophrenia.  This seems to cause at least mild to moderate impairment to patient. History therefore principally from secondary sources only.  Patient also has known diabetes mellitus for which she takes hypoglycemic agents.  Apparently patient's appetite has been poor last few days but he has continued to take his diabetes medications.  Pt BIBA from home after family reports finding him on the floor with spastic activity . Glucose initially 40 for EMS, gave soda and food, came up to 80's. Brought to the ER. glucose dropped to 60's here with increased symptoms, tried juice but dropped to 30's got 1 amp d50 and last sugar 100.   Patient's only complaint is mild neck pain in the posterior midline.  CT head and neck is nonactionable.  There is no focal weakness reported or examined.    Review of Systems: unable to review all systems due to the inability of the patient to answer questions. Past Medical History:  Diagnosis Date   Anxiety    Cancer (HCC)    prostate   CKD (chronic kidney disease) stage 3, GFR 30-59 ml/min (HCC)    Depression    GERD (gastroesophageal reflux disease)    under control   History of Clostridium difficile colitis    03-31-2015--  resolved   History of sepsis    secondary to UTI and ARF  x2 admission 03-31-2015 &  04-14-2015   Hyperlipidemia    Paranoid schizophrenia [facility])    Urinary incontinence    Past Surgical History:  Procedure Laterality Date   CERVICAL DISC SURGERY  1990's   CYSTOSCOPY N/A 03/08/2015   Procedure: CYSTOSCOPY;  Surgeon: Su Grand, MD;  Location: [facility];  Service: Urology;  Laterality: N/A;    CYSTOSCOPY  06/10/2015   Procedure: CYSTOSCOPY;  Surgeon: Malen Gauze, MD;  Location: [facility];  Service: Urology;;   PROSTATE BIOPSY N/A 03/08/2015   Procedure: BIOPSY TRANSRECTAL ULTRASONIC PROSTATE (TUBP);  Surgeon: Su Grand, MD;  Location: [facility];  Service: Urology;  Laterality: N/A;   TRANSTHORACIC ECHOCARDIOGRAM  06-11-2015   grade 1 diastolic dysfunction, ef 55-60%;  trivial TR   TRANSURETHRAL RESECTION OF BLADDER TUMOR N/A 03/08/2015   Procedure: TRANSURETHRAL RESECTION OF BLADDER TUMOR (TURBT);  Surgeon: Su Grand, MD;  Location: [facility];  Service: Urology;  Laterality: N/A;  TUR BIOPSY BLADDER   TRANSURETHRAL RESECTION OF PROSTATE N/A 06/27/2015   Procedure: TRANSURETHRAL RESECTION OF THE PROSTATE WITH GYRUS INSTRUMENTS;  Surgeon: Malen Gauze, MD;  Location: [facility];  Service: Urology;  Laterality: N/A;   TRANSURETHRAL RESECTION OF PROSTATE N/A 07/09/2016   Procedure: TRANSURETHRAL RESECTION OF THE PROSTATE (TURP);  Surgeon: Malen Gauze, MD;  Location: [facility];  Service: Urology;  Laterality: N/A;   Social History:  reports that he has been smoking cigarettes. He has a 40 pack-year smoking history. He has never used smokeless tobacco. He reports that he does not drink alcohol and does not use drugs.  No Known Allergies  Family History  Problem Relation Age of Onset   Breast cancer Mother     Prior to Admission medications  Medication Sig Start Date End Date Taking? Authorizing Provider  acetaminophen (TYLENOL) 325 MG tablet Take 650 mg by mouth every 6 (six) hours as needed for moderate pain.   Yes [provider]  aspirin EC 81 MG tablet Take 81 mg by mouth daily.   Yes [provider]  benztropine (COGENTIN) 0.5 MG tablet Take 0.5 mg by mouth every morning. 09/10/23  Yes [provider]  bethanechol (URECHOLINE) 5 MG tablet TAKE 1 TABLET (5 MG TOTAL) BY MOUTH IN THE MORNING  AND AT BEDTIME 06/04/23  Yes McKenzie, Mardene Celeste, MD  CAPLYTA 42 MG capsule Take 42 mg by mouth every morning. 08/20/23  Yes [provider]  glimepiride (AMARYL) 4 MG tablet Take 4 mg by mouth every morning. 08/26/23  Yes [provider]  metFORMIN (GLUCOPHAGE) 500 MG tablet Take 500 mg by mouth 2 (two) times daily. 09/08/23  Yes [provider]  Multiple Vitamin (MULTIVITAMIN) tablet Take 1 tablet by mouth daily.   Yes [provider]  sitaGLIPtin (JANUVIA) 100 MG tablet Take 1 tablet (100 mg total) by mouth daily. 06/13/23  Yes Shalhoub, Deno Lunger, MD  tamsulosin (FLOMAX) 0.4 MG CAPS capsule TAKE 1 CAPSULE BY MOUTH TWICE A DAY 06/18/23  Yes McKenzie, Mardene Celeste, MD  temazepam (RESTORIL) 30 MG capsule Take 30 mg by mouth at bedtime. 09/21/23  Yes [provider]  Vitamin D, Ergocalciferol, (DRISDOL) 1.25 MG (50000 UNIT) CAPS capsule Take 50,000 Units by mouth every 7 (seven) days.   Yes [provider]  VRAYLAR 6 MG CAPS Take 6 mg by mouth every morning. 10/24/21  Yes [provider]  Continuous Glucose Receiver (FREESTYLE LIBRE 3 READER) DEVI Use to check blood sugar 3 (three) times daily before meals. 06/13/23   Shalhoub, Deno Lunger, MD  Continuous Glucose Sensor (FREESTYLE LIBRE 3 SENSOR) MISC Place 1 sensor on the skin every 14 days. Use to check glucose continuously 3 times a day before meals 06/13/23   Shalhoub, Deno Lunger, MD  EX-LAX 15 MG CHEW Chew 15 mg by mouth every 6 (six) hours as needed (for constipation).    [provider]  finasteride (PROSCAR) 5 MG tablet TAKE 1 TABLET BY MOUTH EVERY DAY Patient taking differently: Take 5 mg by mouth daily. 05/17/20   McKenzie, Mardene Celeste, MD  furosemide (LASIX) 20 MG tablet Take 20 mg by mouth daily as needed for fluid or edema. 05/11/22   [provider]  Ipratropium-Albuterol (COMBIVENT RESPIMAT) 20-100 MCG/ACT AERS respimat INHALE 1 PUFF INTO THE LUNGS EVERY 6 HOURS AS NEEDED  FOR WHEEZING OR SHORTNESS OF BREATH. Patient taking differently: Inhale 1 puff into the lungs every 6 (six) hours as needed for wheezing or shortness of breath. 06/04/22   Coralyn Helling, MD  nystatin cream (MYCOSTATIN) Apply to affected area 2 times daily 05/05/23   Rondel Baton, MD  oxybutynin (DITROPAN) 5 MG tablet Take 5 mg by mouth daily.    [provider]  Tart Cherry 500 MG CAPS Take 500 mg by mouth daily.    [provider]  temazepam (RESTORIL) 15 MG capsule Take 15 mg by mouth at bedtime. 06/14/16   [provider]    Physical Exam: Vitals:   09/24/23 1925 09/24/23 1936 09/24/23 1955 09/24/23 2015  BP:   105/68 103/63  Pulse:   81 79  Resp:   (!) 25 20  Temp:   98.1 F (36.7 C)   TempSrc:   Oral   SpO2:  97%  96% 95%  Weight:  88.4 kg    Height:  5\' 11"  (1.803 m)     General: Patient appears to be in no distress.  However.  Patient speaks very slowly and softly.  Very hard to comprehend sometimes.  Patient is alert and awake, makes very poor eye contact.  Able to give name and date of birth. Respiratory exam: Bilateral intravesicular Cardiovascular exam S1-S2 normal Abdomen soft nontender Extremities warm without edema No focal motor deficit.  No spinal tenderness.  Patient shows midline area of pain about C2 or C3 in the neck posteriorly.  Again no tenderness here.  Patient is able to rotate head about 30 degrees each side. Data Reviewed:  Labs on Admission:  Results for orders placed or performed during the hospital encounter of 09/24/23 (from the past 24 hours)  CBG monitoring, ED     Status: Abnormal   Collection Time: 09/24/23  7:17 PM  Result Value Ref Range   Glucose-Capillary 68 (L) 70 - 99 mg/dL   Comment 1 Notify RN    Comment 2 Document in Chart   CBC     Status: Abnormal   Collection Time: 09/24/23  7:49 PM  Result Value Ref Range   WBC 13.1 (H) 4.0 - 10.5 K/uL   RBC 4.83 4.22 - 5.81 MIL/uL   Hemoglobin 12.3 (L) 13.0 - 17.0  g/dL   HCT 78.2 (L) 95.6 - 21.3 %   MCV 79.3 (L) 80.0 - 100.0 fL   MCH 25.5 (L) 26.0 - 34.0 pg   MCHC 32.1 30.0 - 36.0 g/dL   RDW 08.6 57.8 - 46.9 %   Platelets 303 150 - 400 K/uL   nRBC 0.0 0.0 - 0.2 %  Urinalysis, Routine w reflex microscopic -Urine, Clean Catch     Status: Abnormal   Collection Time: 09/24/23  7:49 PM  Result Value Ref Range   Color, Urine YELLOW YELLOW   APPearance CLOUDY (A) CLEAR   Specific Gravity, Urine 1.001 (L) 1.005 - 1.030   pH 7.0 5.0 - 8.0   Glucose, UA NEGATIVE NEGATIVE mg/dL   Hgb urine dipstick MODERATE (A) NEGATIVE   Bilirubin Urine NEGATIVE NEGATIVE   Ketones, ur NEGATIVE NEGATIVE mg/dL   Protein, ur NEGATIVE NEGATIVE mg/dL   Nitrite NEGATIVE NEGATIVE   Leukocytes,Ua LARGE (A) NEGATIVE   RBC / HPF 0-5 0 - 5 RBC/hpf   WBC, UA 11-20 0 - 5 WBC/hpf   Bacteria, UA RARE (A) NONE SEEN   Squamous Epithelial / HPF 0-5 0 - 5 /HPF   WBC Clumps PRESENT    Mucus PRESENT    Amorphous Crystal PRESENT   Comprehensive metabolic panel     Status: Abnormal   Collection Time: 09/24/23  7:49 PM  Result Value Ref Range   Sodium 132 (L) 135 - 145 mmol/L   Potassium 3.1 (L) 3.5 - 5.1 mmol/L   Chloride 95 (L) 98 - 111 mmol/L   CO2 25 22 - 32 mmol/L   Glucose, Bld 57 (L) 70 - 99 mg/dL   BUN 18 8 - 23 mg/dL   Creatinine, Ser 6.29 (H) 0.61 - 1.24 mg/dL   Calcium 8.7 (L) 8.9 - 10.3 mg/dL   Total Protein 7.1 6.5 - 8.1 g/dL   Albumin 3.2 (L) 3.5 - 5.0 g/dL   AST 12 (L) 15 - 41 U/L   ALT 9 0 - 44 U/L   Alkaline Phosphatase 65 38 - 126 U/L   Total Bilirubin 0.7  0.0 - 1.2 mg/dL   GFR, Estimated 48 (L) >60 mL/min   Anion gap 12 5 - 15  CBG monitoring, ED (now and then every hour for 3 hours)     Status: Abnormal   Collection Time: 09/24/23  8:26 PM  Result Value Ref Range   Glucose-Capillary 33 (LL) 70 - 99 mg/dL   Comment 1 Notify RN   CBG monitoring, ED (now and then every hour for 3 hours)     Status: Abnormal   Collection Time: 09/24/23  9:38 PM  Result  Value Ref Range   Glucose-Capillary 101 (H) 70 - 99 mg/dL   Comment 1 Notify RN    Comment 2 Document in Chart    Basic Metabolic Panel: Recent Labs  Lab 09/24/23 1949  NA 132*  K 3.1*  CL 95*  CO2 25  GLUCOSE 57*  BUN 18  CREATININE 1.58*  CALCIUM 8.7*   Liver Function Tests: Recent Labs  Lab 09/24/23 1949  AST 12*  ALT 9  ALKPHOS 65  BILITOT 0.7  PROT 7.1  ALBUMIN 3.2*   No results for input(s): "LIPASE", "AMYLASE" in the last 168 hours. No results for input(s): "AMMONIA" in the last 168 hours. CBC: Recent Labs  Lab 09/24/23 1949  WBC 13.1*  HGB 12.3*  HCT 38.3*  MCV 79.3*  PLT 303   Cardiac Enzymes: No results for input(s): "CKTOTAL", "CKMB", "CKMBINDEX", "TROPONINIHS" in the last 168 hours.  BNP (last 3 results) No results for input(s): "PROBNP" in the last 8760 hours. CBG: Recent Labs  Lab 09/24/23 1917 09/24/23 2026 09/24/23 2138  GLUCAP 68* 33* 101*    Radiological Exams on Admission:  CT Cervical Spine Wo Contrast Result Date: 09/24/2023 CLINICAL DATA:  Found down, neck trauma EXAM: CT CERVICAL SPINE WITHOUT CONTRAST TECHNIQUE: Multidetector CT imaging of the cervical spine was performed without intravenous contrast. Multiplanar CT image reconstructions were also generated. RADIATION DOSE REDUCTION: This exam was performed according to the departmental dose-optimization program which includes automated exposure control, adjustment of the mA and/or kV according to patient size and/or use of iterative reconstruction technique. COMPARISON:  06/11/2023 FINDINGS: Alignment: Alignment is grossly anatomic. Skull base and vertebrae: No acute fracture. No primary bone lesion or focal pathologic process. Soft tissues and spinal canal: No prevertebral fluid or swelling. No visible canal hematoma. Disc levels: Prior C4-5 ACDF. Stable spondylosis with prominent anterior osteophyte formation at C3-4, C5-6, and C6-7. Upper chest: Airway is patent.  Lung apices are  clear. Other: Reconstructed images demonstrate no additional findings. IMPRESSION: 1. No acute cervical spine fracture. 2. Stable postsurgical and degenerative changes. Electronically Signed   By: Sharlet Salina M.D.   On: 09/24/2023 21:11   CT Head Wo Contrast Result Date: 09/24/2023 CLINICAL DATA:  Found down, head trauma EXAM: CT HEAD WITHOUT CONTRAST TECHNIQUE: Contiguous axial images were obtained from the base of the skull through the vertex without intravenous contrast. RADIATION DOSE REDUCTION: This exam was performed according to the departmental dose-optimization program which includes automated exposure control, adjustment of the mA and/or kV according to patient size and/or use of iterative reconstruction technique. COMPARISON:  06/11/2023 FINDINGS: Brain: No acute infarct or hemorrhage. Lateral ventricles and midline structures are unremarkable. No acute extra-axial fluid collections. No mass effect. Vascular: No hyperdense vessel or unexpected calcification. Skull: Normal. Negative for fracture or focal lesion. Sinuses/Orbits: No acute finding. Other: None. IMPRESSION: 1. No acute intracranial process. Electronically Signed   By: Sharlet Salina M.D.   On:  09/24/2023 21:09     EKG: Independently reviewed. Artifact. No abn detected. Suspect nsr.  No intake/output data recorded. No intake/output data recorded.       Assessment and Plan: Hypoglycemia Please see HPI.  Likely due to glimepiride use.  We will stop all diabetic medications for now.  We will treat the patient with D10 infusion at 100 an hour.  Maintain the patient on telemetry and monitor patient's glucose.  At this time I would like the patient's glucose to be around  200.  To make sure we do not have more hypoglycemic episode.  Will titrate down D10 as needed to keep the glucose around 200.  Fall at home, initial encounter This is likely due to hypoglycemia.  Patient complaining of some neck pain as above, no neck  tenderness.  CT neck is not actionable.  No focal neurologic deficit.  He will monitor clinically.  Patient is noted to have a hemoglobin by dipstick in UA not corroborated on microscopy.  Will check CK.   Med rec has been complicated as aptient home meds could not be reliably ascertained by pharmcy. Will not order lasix. And will request psych consult to eval psych mds as aptient does have at least moderate symptoms right now.   C.w duoneb Hold cogentin (?held by psych) C.w. caplyta and vraylar C/w temazepaam at bedtime C.w flomax, finasteride and bethanechol.  Dvt ppx - lovenox.   Advance Care Planning:   Code Status: Prior full code.  Consults: none   Severity of Illness: The appropriate patient status for this patient is INPATIENT. Inpatient status is judged to be reasonable and necessary in order to provide the required intensity of service to ensure the patient's safety. The patient's presenting symptoms, physical exam findings, and initial radiographic and laboratory data in the context of their chronic comorbidities is felt to place them at high risk for further clinical deterioration. Furthermore, it is not anticipated that the patient will be medically stable for discharge from the hospital within 2 midnights of admission.   * I certify that at the point of admission it is my clinical judgment that the patient will require inpatient hospital care spanning beyond 2 midnights from the point of admission due to high intensity of service, high risk for further deterioration and high frequency of surveillance required.*  Author: Nolberto Hanlon, MD 09/24/2023 11:06 PM  For on call review [email or web address].

## 2023-09-24 NOTE — ED Notes (Signed)
Pt given peanut butter crackers and orange juice.  

## 2023-09-24 NOTE — ED Notes (Signed)
 Pt's CBG 68 on arrival, provided with OJ, PB, and graham crackers.

## 2023-09-24 NOTE — ED Triage Notes (Signed)
 Pt BIBA from home after family reports finding him on the floor with spastic activity.. Pt denies hitting his head today when being found.Per family this is how Pt acts when his glucose is low. Pt answers questions appropriately but does wax and wane during triage assessment. Medics also endorses CBG 49 upon arrival with oral glucose and a soda give. CBG then came up to 99. 250 cc bolus also given for an initial palpated BP of 88.  HR- 85 BP- 118/72 O2 -97%RA

## 2023-09-24 NOTE — ED Provider Notes (Signed)
 [facility] AT [facility] Provider Note   CSN: [id number] Arrival date & time: 09/24/23  1900     History  Chief Complaint  Patient presents with   Hypoglycemia    Edwin [name] is a 68 y.o. male.  Patient is a 68 year old male with a past medical history of diabetes and schizophrenia presenting to the emergency department with hypoglycemia.  Per EMS, family called 911 after finding the patient on the ground spastic appearing which is similar to when he has had low blood sugar in the past.  Per EMS his sugar was in the 40s on their arrival and they were able to give him soda and food and his blood sugar increased to 88.  They also reported he was hypotensive to the 80s in the field improved with the 250 cc bolus.  On patient's arrival here he is awake and oriented though muscles are stiff and tremulous which he states is similar to when his blood sugars have been low in the past.  He states that he has been taking his medications as prescribed however has had a decreased appetite and has not been eating or drinking as much recently.  He states that he did fall but does not think that he hit his head and denies any loss of consciousness.  He states he is having a mild headache as well as some neck pain.  He denies any other pain or injuries from the fall.  He denies any recent fever, nausea, vomiting or diarrhea.  He does have a chronic indwelling Foley catheter and states that he is having pain in his urethra around the catheter insertion site.  The history is provided by the patient and the EMS personnel.  Hypoglycemia      Home Medications Prior to Admission medications   Medication Sig Start Date End Date Taking? Authorizing Provider  acetaminophen (TYLENOL) 325 MG tablet Take 650 mg by mouth every 6 (six) hours as needed for moderate pain.   Yes [provider]  aspirin EC 81 MG tablet Take 81 mg by mouth daily.   Yes [provider]   benztropine (COGENTIN) 0.5 MG tablet Take 0.5 mg by mouth every morning. 09/10/23  Yes [provider]  bethanechol (URECHOLINE) 5 MG tablet TAKE 1 TABLET (5 MG TOTAL) BY MOUTH IN THE MORNING AND AT BEDTIME 06/04/23  Yes McKenzie, Mardene Celeste, MD  CAPLYTA 42 MG capsule Take 42 mg by mouth every morning. 08/20/23  Yes [provider]  EX-LAX 15 MG CHEW Chew 15 mg by mouth every 6 (six) hours as needed (for constipation).   Yes [provider]  glimepiride (AMARYL) 4 MG tablet Take 4 mg by mouth every morning. 08/26/23  Yes [provider]  Ipratropium-Albuterol (COMBIVENT RESPIMAT) 20-100 MCG/ACT AERS respimat INHALE 1 PUFF INTO THE LUNGS EVERY 6 HOURS AS NEEDED FOR WHEEZING OR SHORTNESS OF BREATH. Patient taking differently: Inhale 1 puff into the lungs every 6 (six) hours as needed for wheezing or shortness of breath. 06/04/22  Yes Coralyn Helling, MD  metFORMIN (GLUCOPHAGE) 500 MG tablet Take 500 mg by mouth 2 (two) times daily. 09/08/23  Yes [provider]  Multiple Vitamin (MULTIVITAMIN) tablet Take 1 tablet by mouth daily.   Yes [provider]  sitaGLIPtin (JANUVIA) 100 MG tablet Take 1 tablet (100 mg total) by mouth daily. 06/13/23  Yes Shalhoub, Deno Lunger, MD  tamsulosin (FLOMAX) 0.4 MG CAPS capsule TAKE 1 CAPSULE BY MOUTH  TWICE A DAY 06/18/23  Yes McKenzie, Mardene Celeste, MD  temazepam (RESTORIL) 30 MG capsule Take 30 mg by mouth at bedtime. 09/21/23  Yes [provider]  Vitamin D, Ergocalciferol, (DRISDOL) 1.25 MG (50000 UNIT) CAPS capsule Take 50,000 Units by mouth every 7 (seven) days.   Yes [provider]  VRAYLAR 6 MG CAPS Take 6 mg by mouth every morning. 10/24/21  Yes [provider]  Continuous Glucose Receiver (FREESTYLE LIBRE 3 READER) DEVI Use to check blood sugar 3 (three) times daily before meals. 06/13/23   Shalhoub, Deno Lunger, MD  Continuous Glucose Sensor (FREESTYLE LIBRE 3 SENSOR) MISC Place 1 sensor on the  skin every 14 days. Use to check glucose continuously 3 times a day before meals 06/13/23   Shalhoub, Deno Lunger, MD  finasteride (PROSCAR) 5 MG tablet TAKE 1 TABLET BY MOUTH EVERY DAY Patient not taking: Reported on 09/24/2023 05/17/20   Malen Gauze, MD  furosemide (LASIX) 20 MG tablet Take 20 mg by mouth daily as needed for fluid or edema. 05/11/22   [provider]  nystatin cream (MYCOSTATIN) Apply to affected area 2 times daily Patient not taking: Reported on 09/24/2023 05/05/23   Rondel Baton, MD  oxybutynin (DITROPAN) 5 MG tablet Take 5 mg by mouth daily. Patient not taking: Reported on 09/24/2023    [provider]  Tart Cherry 500 MG CAPS Take 500 mg by mouth daily. Patient not taking: Reported on 09/24/2023    [provider]  temazepam (RESTORIL) 15 MG capsule Take 15 mg by mouth at bedtime. Patient not taking: Reported on 09/24/2023 06/14/16   [provider]      Allergies    Patient has no known allergies.    Review of Systems   Review of Systems  Physical Exam Updated Vital Signs BP 103/63   Pulse 87   Temp 98.1 F (36.7 C) (Oral)   Resp 16   Ht 5\' 11"  (1.803 m)   Wt 88.4 kg   SpO2 96%   BMI 27.18 kg/m  Physical Exam Vitals and nursing note reviewed. Exam conducted with a chaperone present.  Constitutional:      General: He is not in acute distress.    Appearance: Normal appearance.  HENT:     Head: Normocephalic and atraumatic.     Nose: Nose normal.     Mouth/Throat:     Mouth: Mucous membranes are moist.     Pharynx: Oropharynx is clear.  Eyes:     Extraocular Movements: Extraocular movements intact.     Conjunctiva/sclera: Conjunctivae normal.  Neck:     Comments: Mild diffuse c-spine tenderness Cardiovascular:     Rate and Rhythm: Normal rate and regular rhythm.     Heart sounds: Normal heart sounds.  Pulmonary:     Effort: Pulmonary effort is normal.     Breath sounds: Normal breath sounds.   Abdominal:     General: Abdomen is flat.     Palpations: Abdomen is soft.     Tenderness: There is no abdominal tenderness.  Genitourinary:    Comments: Chronic appearing urethral breakdown at foley catheter site, no wounds or bleeding Yellow urine in foley bag Musculoskeletal:        General: Normal range of motion.     Cervical back: Normal range of motion.     Comments: Muscles stiff   Skin:    General: Skin is warm and dry.  Neurological:     General: No  focal deficit present.     Mental Status: He is alert and oriented to person, place, and time.     Sensory: No sensory deficit.     Motor: No weakness.  Psychiatric:        Mood and Affect: Mood normal.        Behavior: Behavior normal.     ED Results / Procedures / Treatments   Labs (all labs ordered are listed, but only abnormal results are displayed) Labs Reviewed  CBC - Abnormal; Notable for the following components:      Result Value   WBC 13.1 (*)    Hemoglobin 12.3 (*)    HCT 38.3 (*)    MCV 79.3 (*)    MCH 25.5 (*)    All other components within normal limits  URINALYSIS, ROUTINE W REFLEX MICROSCOPIC - Abnormal; Notable for the following components:   APPearance CLOUDY (*)    Specific Gravity, Urine 1.001 (*)    Hgb urine dipstick MODERATE (*)    Leukocytes,Ua LARGE (*)    Bacteria, UA RARE (*)    All other components within normal limits  COMPREHENSIVE METABOLIC PANEL - Abnormal; Notable for the following components:   Sodium 132 (*)    Potassium 3.1 (*)    Chloride 95 (*)    Glucose, Bld 57 (*)    Creatinine, Ser 1.58 (*)    Calcium 8.7 (*)    Albumin 3.2 (*)    AST 12 (*)    GFR, Estimated 48 (*)    All other components within normal limits  CBG MONITORING, ED - Abnormal; Notable for the following components:   Glucose-Capillary 68 (*)    All other components within normal limits  CBG MONITORING, ED - Abnormal; Notable for the following components:   Glucose-Capillary 33 (*)    All other  components within normal limits  CBG MONITORING, ED - Abnormal; Notable for the following components:   Glucose-Capillary 101 (*)    All other components within normal limits  CK  CBG MONITORING, ED    EKG EKG Interpretation Date/Time:  Tuesday September 24 2023 20:09:43 EST Ventricular Rate:  79 PR Interval:  174 QRS Duration:  132 QT Interval:  379 QTC Calculation: 435 R Axis:   1  Text Interpretation: Sinus rhythm Nonspecific intraventricular conduction delay Interpretation limited secondary to artifact Otherwise no significant change Confirmed by Elayne Snare ([id number]) on 09/24/2023 8:26:30 PM  Radiology CT Cervical Spine Wo Contrast Result Date: 09/24/2023 CLINICAL DATA:  Found down, neck trauma EXAM: CT CERVICAL SPINE WITHOUT CONTRAST TECHNIQUE: Multidetector CT imaging of the cervical spine was performed without intravenous contrast. Multiplanar CT image reconstructions were also generated. RADIATION DOSE REDUCTION: This exam was performed according to the departmental dose-optimization program which includes automated exposure control, adjustment of the mA and/or kV according to patient size and/or use of iterative reconstruction technique. COMPARISON:  06/11/2023 FINDINGS: Alignment: Alignment is grossly anatomic. Skull base and vertebrae: No acute fracture. No primary bone lesion or focal pathologic process. Soft tissues and spinal canal: No prevertebral fluid or swelling. No visible canal hematoma. Disc levels: Prior C4-5 ACDF. Stable spondylosis with prominent anterior osteophyte formation at C3-4, C5-6, and C6-7. Upper chest: Airway is patent.  Lung apices are clear. Other: Reconstructed images demonstrate no additional findings. IMPRESSION: 1. No acute cervical spine fracture. 2. Stable postsurgical and degenerative changes. Electronically Signed   By: Sharlet Salina M.D.   On: 09/24/2023 21:11   CT Head Wo Contrast Result  Date: 09/24/2023 CLINICAL DATA:  Found down, head  trauma EXAM: CT HEAD WITHOUT CONTRAST TECHNIQUE: Contiguous axial images were obtained from the base of the skull through the vertex without intravenous contrast. RADIATION DOSE REDUCTION: This exam was performed according to the departmental dose-optimization program which includes automated exposure control, adjustment of the mA and/or kV according to patient size and/or use of iterative reconstruction technique. COMPARISON:  06/11/2023 FINDINGS: Brain: No acute infarct or hemorrhage. Lateral ventricles and midline structures are unremarkable. No acute extra-axial fluid collections. No mass effect. Vascular: No hyperdense vessel or unexpected calcification. Skull: Normal. Negative for fracture or focal lesion. Sinuses/Orbits: No acute finding. Other: None. IMPRESSION: 1. No acute intracranial process. Electronically Signed   By: Sharlet Salina M.D.   On: 09/24/2023 21:09    Procedures .Critical Care  Performed by: Rexford Maus, DO Authorized by: Rexford Maus, DO   Critical care provider statement:    Critical care time (minutes):  30   Critical care was necessary to treat or prevent imminent or life-threatening deterioration of the following conditions:  Endocrine crisis   Critical care was time spent personally by me on the following activities:  Development of treatment plan with patient or surrogate, discussions with consultants, evaluation of patient's response to treatment, examination of patient, ordering and review of laboratory studies, ordering and review of radiographic studies, ordering and performing treatments and interventions, pulse oximetry, re-evaluation of patient's condition and review of old charts     Medications Ordered in ED Medications  dextrose 10 % infusion (has no administration in time range)  dextrose 50 % solution 50 mL (50 mLs Intravenous Given 09/24/23 2030)  dextrose 50 % solution (  Given 09/24/23 2031)  potassium chloride SA (KLOR-CON M) CR  tablet 40 mEq (40 mEq Oral Given 09/24/23 2038)    ED Course/ Medical Decision Making/ A&P Clinical Course as of 09/24/23 2321  Tue Sep 24, 2023  2034 Repeat glucose 33, patient will be given amp of D50.  He has mild hypokalemia on labs which will be repleted, creatinine at baseline.  Does have a leukocytosis, urine is pending at this time. [VK]  2121 No acute traumatic injury on CT imaging. Urine with rare bacteria and large leuks, appears at baseline for his urine with chronic foley and doubt acute infection. Will recheck glucose. [VK]  2200 Repeat glucose improved to 101 after D50, patient reports still feeling weak and unwell with poor appetite. Will be admitted for hypogylcemia and further monitoring. [VK]    Clinical Course User Index [VK] Rexford Maus, DO                                 Medical Decision Making This patient presents to the ED with chief complaint(s) of hypoglycemia, fall with pertinent past medical history of DM, schizophrenia which further complicates the presenting complaint. The complaint involves an extensive differential diagnosis and also carries with it a high risk of complications and morbidity.    The differential diagnosis includes hypoglycemia, infection, medication overdose, electrolyte abnormality, the new ICH, mass effect or cervical spine fracture, no other traumatic injury seen on exam  Additional history obtained: Additional history obtained from EMS  Records reviewed previous admission documents  ED Course and Reassessment: On patient's arrival he was hemodynamically stable in no acute distress.  Did not show blood glucose was 68 and the patient was given juice and offered crackers  but did not want to eat the crackers.  EKG on arrival showed normal sinus rhythm without acute ischemic changes.  Due to patient's fall additionally have had an C-spine CT performed.  Patient's glucose will be closely reassessed  Independent labs interpretation:   The following labs were independently interpreted: hypoglycemia, otherwise within normal range  Independent visualization of imaging: - I independently visualized the following imaging with scope of interpretation limited to determining acute life threatening conditions related to emergency care: CTH/C-spine, which revealed no acute traumatic injury  Consultation: - Consulted or discussed management/test interpretation w/ external professional: hospitalist  Consideration for admission or further workup: patient requires admission for recurrent hypogylcemia Social Determinants of health: N/A    Amount and/or Complexity of Data Reviewed Labs: ordered. Radiology: ordered.  Risk Prescription drug management. Decision regarding hospitalization.          Final Clinical Impression(s) / ED Diagnoses Final diagnoses:  Hypoglycemia    Rx / DC Orders ED Discharge Orders     None         Rexford Maus, DO 09/24/23 2321

## 2023-09-24 NOTE — Assessment & Plan Note (Signed)
 Please see HPI.  Likely due to glimepiride use.  We will stop all diabetic medications for now.  We will treat the patient with D10 infusion at 100 an hour.  Maintain the patient on telemetry and monitor patient's glucose.  At this time I would like the patient's glucose to be around  200.  To make sure we do not have more hypoglycemic episode.  Will titrate down D10 as needed to keep the glucose around 200.

## 2023-09-24 NOTE — Assessment & Plan Note (Signed)
 This is likely due to hypoglycemia.  Patient complaining of some neck pain as above, no neck tenderness.  CT neck is not actionable.  No focal neurologic deficit.  He will monitor clinically.  Patient is noted to have a hemoglobin by dipstick in UA not corroborated on microscopy.  Will check CK.

## 2023-09-25 DIAGNOSIS — N1831 Chronic kidney disease, stage 3a: Secondary | ICD-10-CM

## 2023-09-25 DIAGNOSIS — W19XXXA Unspecified fall, initial encounter: Secondary | ICD-10-CM | POA: Diagnosis not present

## 2023-09-25 DIAGNOSIS — F200 Paranoid schizophrenia: Secondary | ICD-10-CM | POA: Diagnosis not present

## 2023-09-25 DIAGNOSIS — E11649 Type 2 diabetes mellitus with hypoglycemia without coma: Secondary | ICD-10-CM

## 2023-09-25 LAB — CBC
HCT: 38.6 % — ABNORMAL LOW (ref 39.0–52.0)
Hemoglobin: 12.5 g/dL — ABNORMAL LOW (ref 13.0–17.0)
MCH: 25.5 pg — ABNORMAL LOW (ref 26.0–34.0)
MCHC: 32.4 g/dL (ref 30.0–36.0)
MCV: 78.8 fL — ABNORMAL LOW (ref 80.0–100.0)
Platelets: 293 10*3/uL (ref 150–400)
RBC: 4.9 MIL/uL (ref 4.22–5.81)
RDW: 15.7 % — ABNORMAL HIGH (ref 11.5–15.5)
WBC: 11.7 10*3/uL — ABNORMAL HIGH (ref 4.0–10.5)
nRBC: 0 % (ref 0.0–0.2)

## 2023-09-25 LAB — BASIC METABOLIC PANEL
Anion gap: 10 (ref 5–15)
BUN: 15 mg/dL (ref 8–23)
CO2: 24 mmol/L (ref 22–32)
Calcium: 8.6 mg/dL — ABNORMAL LOW (ref 8.9–10.3)
Chloride: 96 mmol/L — ABNORMAL LOW (ref 98–111)
Creatinine, Ser: 1.61 mg/dL — ABNORMAL HIGH (ref 0.61–1.24)
GFR, Estimated: 47 mL/min — ABNORMAL LOW (ref >60)
Glucose, Bld: 53 mg/dL — ABNORMAL LOW (ref 70–99)
Potassium: 3.5 mmol/L (ref 3.5–5.1)
Sodium: 130 mmol/L — ABNORMAL LOW (ref 135–145)

## 2023-09-25 LAB — CBG MONITORING, ED
Glucose-Capillary: 105 mg/dL — ABNORMAL HIGH (ref 70–99)
Glucose-Capillary: 124 mg/dL — ABNORMAL HIGH (ref 70–99)
Glucose-Capillary: 40 mg/dL — CL (ref 70–99)
Glucose-Capillary: 47 mg/dL — ABNORMAL LOW (ref 70–99)
Glucose-Capillary: 49 mg/dL — ABNORMAL LOW (ref 70–99)
Glucose-Capillary: 84 mg/dL (ref 70–99)
Glucose-Capillary: 93 mg/dL (ref 70–99)
Glucose-Capillary: 94 mg/dL (ref 70–99)
Glucose-Capillary: 96 mg/dL (ref 70–99)
Glucose-Capillary: 97 mg/dL (ref 70–99)
Glucose-Capillary: 98 mg/dL (ref 70–99)
Glucose-Capillary: 99 mg/dL (ref 70–99)

## 2023-09-25 LAB — GLUCOSE, CAPILLARY
Glucose-Capillary: 130 mg/dL — ABNORMAL HIGH (ref 70–99)
Glucose-Capillary: 177 mg/dL — ABNORMAL HIGH (ref 70–99)
Glucose-Capillary: 180 mg/dL — ABNORMAL HIGH (ref 70–99)

## 2023-09-25 LAB — PROTIME-INR
INR: 1.1 (ref 0.8–1.2)
Prothrombin Time: 14.4 s (ref 11.4–15.2)

## 2023-09-25 LAB — APTT: aPTT: 38 s — ABNORMAL HIGH (ref 24–36)

## 2023-09-25 LAB — HIV ANTIBODY (ROUTINE TESTING W REFLEX): HIV Screen 4th Generation wRfx: NONREACTIVE

## 2023-09-25 MED ORDER — DEXTROSE 50 % IV SOLN: 1.0000 | Freq: Once | INTRAVENOUS | Status: AC

## 2023-09-25 MED ORDER — LACTATED RINGERS IV BOLUS
500.0000 mL | Freq: Once | INTRAVENOUS | Status: AC
  Administered 2023-09-25: 500 mL via INTRAVENOUS

## 2023-09-25 MED ORDER — PNEUMOCOCCAL 20-VAL CONJ VACC 0.5 ML IM SUSY
0.5000 mL | PREFILLED_SYRINGE | INTRAMUSCULAR | Status: AC
Start: 2023-09-26 — End: 2023-09-26
  Administered 2023-09-26: 0.5 mL via INTRAMUSCULAR
  Filled 2023-09-25 (×2): qty 0.5

## 2023-09-25 MED ORDER — INFLUENZA VAC A&B SURF ANT ADJ 0.5 ML IM SUSY
0.5000 mL | PREFILLED_SYRINGE | INTRAMUSCULAR | Status: AC
  Administered 2023-09-26: 0.5 mL via INTRAMUSCULAR
  Filled 2023-09-25: qty 0.5

## 2023-09-25 MED ORDER — GLUCOSE 40 % PO GEL
1.0000 | Freq: Once | ORAL | Status: AC
  Administered 2023-09-25: 31 g via ORAL
  Filled 2023-09-25: qty 1

## 2023-09-25 MED ORDER — DEXTROSE 50 % IV SOLN
25.0000 g | Freq: Once | INTRAVENOUS | Status: AC
  Administered 2023-09-25: 25 g via INTRAVENOUS
  Filled 2023-09-25: qty 50

## 2023-09-25 MED ORDER — LUMATEPERONE TOSYLATE 42 MG PO CAPS
42.0000 mg | ORAL_CAPSULE | Freq: Every day | ORAL | Status: DC
Start: 2023-09-25 — End: 2023-09-25

## 2023-09-25 MED ORDER — ASPIRIN 81 MG PO TBEC
81.0000 mg | DELAYED_RELEASE_TABLET | Freq: Every day | ORAL | Status: DC
  Administered 2023-09-25 – 2023-09-26 (×2): 81 mg via ORAL
  Filled 2023-09-25 (×2): qty 1

## 2023-09-25 MED ORDER — CHLORHEXIDINE GLUCONATE CLOTH 2 % EX PADS
6.0000 | MEDICATED_PAD | Freq: Every day | CUTANEOUS | Status: DC
  Administered 2023-09-26: 6 via TOPICAL

## 2023-09-25 NOTE — Hospital Course (Addendum)
 68 year old man PMH including diabetes mellitus type 2 and schizophrenia, who fell at home and was found to be markedly hypoglycemic.  Admitted for further evaluation.  Thought to be secondary to poor intake in the context of ongoing oral medications.  Consultants None   Procedures/Events None

## 2023-09-25 NOTE — Consult Note (Addendum)
 [facility] Psychiatric Consult Initial  Patient Name: .Edwin [name]  MRN: [id number]  DOB: 28-Nov-1955  Consult Order details:  Orders (From admission, onward)     Start     Ordered   09/24/23 2319  IP CONSULT TO PSYCHIATRY       Ordering Provider: Nolberto Hanlon, MD  Provider:  (Not yet assigned)  Question Answer Comment  Location [facility]   Reason for Consult? moderately uncontroled schizophrenia.      09/24/23 2322             Mode of Visit: In person    Psychiatry Consult Evaluation  Service Date: September 25, 2023 LOS:  LOS: 1 day  Chief Complaint Hypoglycemia   Primary Psychiatric Diagnoses  Paranoid schizophrenia, chronic condition   Assessment  Edwin [name] is a 68 y.o. male admitted: Medicallyfor 09/24/2023  7:07 PM for hypoglycemia. He carries the psychiatric diagnoses of paranoid schizophrenia and has a past medical history of  prostate cancer, CKD 3, GERD, HLD.  He meets criteria for schizophrenia  based on auditory and visual hallucinations, long history of antipsychotic use.  Current outpatient psychotropic medications include Cogentin, Temazepam, Caplyta and Vraylar.  He reports he has been compliant with medications prior to admission. On initial examination, patient was disheveled with blunt effect. Please see plan below for detailed recommendations.   Diagnoses:  Active Hospital problems: Principal Problem:   Hypoglycemia Active Problems:   Paranoid schizophrenia, chronic condition (HCC)   Fall at home, initial encounter    Plan   ## Psychiatric Medication Recommendations:  Restart Captylta 42 mg daily Continue Restoril 30 mg daiy  ## Medical Decision Making Capacity: Not specifically addressed in this encounter  ## Further Work-up:  -- most recent EKG on 09/24/2023 had QtC of 435 -- Pertinent labwork reviewed earlier this admission includes: CBC, CMP, UA, Glucose, EKG   ## Disposition:-- There are no psychiatric  contraindications to discharge at this time  ## Behavioral / Environmental: - No specific recommendations at this time.     ## Safety and Observation Level:  - Based on my clinical evaluation, I estimate the patient to be at low risk of self harm in the current setting. - At this time, we recommend  routine. This decision is based on my review of the chart including patient's history and current presentation, interview of the patient, mental status examination, and consideration of suicide risk including evaluating suicidal ideation, plan, intent, suicidal or self-harm behaviors, risk factors, and protective factors. This judgment is based on our ability to directly address suicide risk, implement suicide prevention strategies, and develop a safety plan while the patient is in the clinical setting. Please contact our team if there is a concern that risk level has changed.  CSSR Risk Category:C-SSRS RISK CATEGORY: No Risk  Suicide Risk Assessment: Patient has following modifiable risk factors for suicide: under treated symptoms which we are addressing by medication management. Patient has following non-modifiable or demographic risk factors for suicide: male gender Patient has the following protective factors against suicide: Supportive family and no history of suicide attempts  Thank you for this consult request. Recommendations have been communicated to the primary team.  We will continue to follow at this time.   Nanine Means, NP       History of Present Illness  Relevant Aspects of [facility] Course:  Admitted on 09/24/2023 for hypoglycemia.   Patient Report:  Upon entering his room, the client was sitting up  finishing his breakfast. The client reports "I am feeling better.". His appetite is good as evidence by finishing his breakfast. He rates his depression as mild and denies suicidal ideation. The client says he worries often which is related to him needing more food at home. He  denies psych medication side effects and states "I think they are working good.". He believes the he occasionally sees girls around him and confirms that no one else seems them. When he closes his eyes they go away. The client states "I do hear some voices. I don't know what they say. I was alright this morning.". He denies that the command things of him and denies homicidal ideation. The client denies substance and tobacco use. He lives with his niece, Edwin [name], and her boyfriend Edwin [name], who comes over to the house often. He confirms feeling safe in the house and having access to food and water.  The client was difficult to understand on assessment.   Psych ROS:  Depression: mild Anxiety:  mild Mania (lifetime and current): None reported Psychosis: (lifetime and current): None reported  Collateral information:  Contacted Edwin [name] at [phone] at 11:41 on 09/25/2023.  "When his blood sugars get low, he goes crazy.  As soon his blood sugar is back up, he's back to his old self".  His brother is very involved in his care and knowledgeable of his appointments and medications.  The medical doctors changed his diabetes medications and his blood sugars are getting too low.  Review of Systems  Constitutional: Negative.   HENT: Negative.    Eyes: Negative.   Respiratory:  Positive for cough.   Cardiovascular: Negative.   Gastrointestinal: Negative.   Genitourinary: Negative.   Musculoskeletal: Negative.   Skin: Negative.   Neurological: Negative.   Endo/Heme/Allergies: Negative.   Psychiatric/Behavioral:  Positive for hallucinations. The patient is nervous/anxious.      Psychiatric and Social History  Psychiatric History:  Information collected from client and EHR  Prev Dx/Sx: none Current Psych Provider: Dr. Voncille Lo Home Meds (current): Caplyta 42mg , Cogentin 0.5mg , Temazapem 15mg , Vryalyar 6mg  Previous Med Trials: none reported Therapy: none reported  Prior Psych Hospitalization:  none reported/ none documented   Prior Self Harm: none reported Prior Violence: none reported  Family Psych History: none reported Family Hx suicide: none reported  Social History:   Legal Hx: none  Living Situation: With his niece, Shana Spiritual Hx: denies Access to weapons/lethal means: No   Substance History Alcohol: Denies   History of alcohol withdrawal seizures Denies History of DT's Denies Tobacco: Denies Illicit drugs: Denies Prescription drug abuse: Denies Rehab hx: Denies  Exam Findings  Physical Exam: Client resting in bed, disheveled finishing his breakfast.  Vital Signs:  Temp:  [97.8 F (36.6 C)-98.3 F (36.8 C)] 98 F (36.7 C) (02/26 0906) Pulse Rate:  [69-168] 73 (02/26 0800) Resp:  [16-25] 18 (02/26 0800) BP: (98-155)/(59-85) 98/64 (02/26 0800) SpO2:  [94 %-99 %] 95 % (02/26 0800) Weight:  [88.4 kg] 88.4 kg (02/25 1936) Blood pressure 98/64, pulse 73, temperature 98 F (36.7 C), temperature source Oral, resp. rate 18, height 5\' 11"  (1.803 m), weight 88.4 kg, SpO2 95%. Body mass index is 27.18 kg/m.  Physical Exam Vitals and nursing note reviewed.  HENT:     Nose: Nose normal.  Pulmonary:     Effort: Pulmonary effort is normal.  Musculoskeletal:        General: Normal range of motion.     Cervical back: Normal range of  motion.  Neurological:     General: No focal deficit present.     Mental Status: He is alert and oriented to person, place, and time.     Mental Status Exam: General Appearance: Disheveled  Orientation:  Full (Time, Place, and Person)  Memory:  Immediate;   Fair Recent;   Fair Remote;   Fair  Concentration:  Concentration: Good and Attention Span: Good  Recall:  Fair  Attention  Good  Eye Contact:  Fair  Speech:  Normal Rate  Language:  Fair  Volume:  Decreased  Mood: Anxious   Affect:  Blunt  Thought Process:  Coherent  Thought Content:  Hallucinations: Auditory Visual, chronic  Suicidal Thoughts:  No   Homicidal Thoughts:  No  Judgement:  Impaired  Insight:  Fair  Psychomotor Activity:  Decreased  Akathisia:  NA  Fund of Knowledge:  Fair      Assets:  [employer]  Cognition:  WDL  ADL's:  Intact  AIMS (if indicated):        Other History   These have been pulled in through the EMR, reviewed, and updated if appropriate.  Family History:  The patient's family history includes Breast cancer in his mother.  Medical History: Past Medical History:  Diagnosis Date   Anxiety    Cancer (HCC)    prostate   CKD (chronic kidney disease) stage 3, GFR 30-59 ml/min (HCC)    Depression    GERD (gastroesophageal reflux disease)    under control   History of Clostridium difficile colitis    03-31-2015--  resolved   History of sepsis    secondary to UTI and ARF  x2 admission 03-31-2015 &  04-14-2015   Hyperlipidemia    Paranoid schizophrenia [facility])    Urinary incontinence     Surgical History: Past Surgical History:  Procedure Laterality Date   CERVICAL DISC SURGERY  1990's   CYSTOSCOPY N/A 03/08/2015   Procedure: CYSTOSCOPY;  Surgeon: Su Grand, MD;  Location: [facility];  Service: Urology;  Laterality: N/A;   CYSTOSCOPY  06/10/2015   Procedure: CYSTOSCOPY;  Surgeon: Malen Gauze, MD;  [facility];  Service: Urology;;   PROSTATE BIOPSY N/A 03/08/2015   Procedure: BIOPSY TRANSRECTAL ULTRASONIC PROSTATE (TUBP);  Surgeon: Su Grand, MD;  Location: [facility];  Service: Urology;  Laterality: N/A;   TRANSTHORACIC ECHOCARDIOGRAM  06-11-2015   grade 1 diastolic dysfunction, ef 55-60%;  trivial TR   TRANSURETHRAL RESECTION OF BLADDER TUMOR N/A 03/08/2015   Procedure: TRANSURETHRAL RESECTION OF BLADDER TUMOR (TURBT);  Surgeon: Su Grand, MD;  Location: [facility];  Service: Urology;  Laterality: N/A;  TUR BIOPSY BLADDER   TRANSURETHRAL RESECTION OF PROSTATE N/A 06/27/2015   Procedure: TRANSURETHRAL RESECTION OF  THE PROSTATE WITH GYRUS INSTRUMENTS;  Surgeon: Malen Gauze, MD;  Location[facility];  Service: Urology;  Laterality: N/A;   TRANSURETHRAL RESECTION OF PROSTATE N/A 07/09/2016   Procedure: TRANSURETHRAL RESECTION OF THE PROSTATE (TURP);  Surgeon: Malen Gauze, MD;  Location: [facility];  Service: Urology;  Laterality: N/A;     Medications:   Current Facility-Administered Medications:    acetaminophen (TYLENOL) tablet 650 mg, 650 mg, Oral, Q6H PRN **OR** acetaminophen (TYLENOL) suppository 650 mg, 650 mg, Rectal, Q6H PRN, Nolberto Hanlon, MD   bethanechol (URECHOLINE) tablet 5 mg, 5 mg, Oral, BID, Nolberto Hanlon, MD, 5 mg at 09/25/23 0047   cariprazine (VRAYLAR) capsule 6 mg, 6 mg,  Oral, Daily, Nolberto Hanlon, MD   dextrose 10 % infusion, , Intravenous, Continuous, Nolberto Hanlon, MD, Stopped at 09/25/23 1000   enoxaparin (LOVENOX) injection 40 mg, 40 mg, Subcutaneous, Q24H, Nolberto Hanlon, MD, 40 mg at 09/25/23 1610   finasteride (PROSCAR) tablet 5 mg, 5 mg, Oral, Daily, Nolberto Hanlon, MD   insulin aspart (novoLOG) injection 0-6 Units, 0-6 Units, Subcutaneous, Q4H, Nolberto Hanlon, MD   ipratropium-albuterol (DUONEB) 0.5-2.5 (3) MG/3ML nebulizer solution 3 mL, 3 mL, Nebulization, Q6H PRN, Nolberto Hanlon, MD   lumateperone tosylate (CAPLYTA) capsule 42 mg, 42 mg, Oral, q morning, Nolberto Hanlon, MD   polyethylene glycol (MIRALAX / GLYCOLAX) packet 17 g, 17 g, Oral, Daily PRN, Nolberto Hanlon, MD   senna-docusate (Senokot-S) tablet 2 tablet, 2 tablet, Oral, BID, Nolberto Hanlon, MD, 2 tablet at 09/25/23 1018   sodium chloride flush (NS) 0.9 % injection 3 mL, 3 mL, Intravenous, Q12H, Nolberto Hanlon, MD, 3 mL at 09/25/23 0031   tamsulosin (FLOMAX) capsule 0.4 mg, 0.4 mg, Oral, BID, Nolberto Hanlon, MD, 0.4 mg at 09/25/23 0047   temazepam (RESTORIL) capsule 30 mg, 30 mg, Oral, QHS, Nolberto Hanlon, MD, 30 mg at 09/25/23 0046  Current Outpatient Medications:    acetaminophen (TYLENOL) 325 MG  tablet, Take 650 mg by mouth every 6 (six) hours as needed for moderate pain., Disp: , Rfl:    aspirin EC 81 MG tablet, Take 81 mg by mouth daily., Disp: , Rfl:    benztropine (COGENTIN) 0.5 MG tablet, Take 0.5 mg by mouth every morning., Disp: , Rfl:    bethanechol (URECHOLINE) 5 MG tablet, TAKE 1 TABLET (5 MG TOTAL) BY MOUTH IN THE MORNING AND AT BEDTIME, Disp: 60 tablet, Rfl: 11   CAPLYTA 42 MG capsule, Take 42 mg by mouth every morning., Disp: , Rfl:    EX-LAX 15 MG CHEW, Chew 15 mg by mouth every 6 (six) hours as needed (for constipation)., Disp: , Rfl:    glimepiride (AMARYL) 4 MG tablet, Take 4 mg by mouth every morning., Disp: , Rfl:    Ipratropium-Albuterol (COMBIVENT RESPIMAT) 20-100 MCG/ACT AERS respimat, INHALE 1 PUFF INTO THE LUNGS EVERY 6 HOURS AS NEEDED FOR WHEEZING OR SHORTNESS OF BREATH. (Patient taking differently: Inhale 1 puff into the lungs every 6 (six) hours as needed for wheezing or shortness of breath.), Disp: 4 g, Rfl: 5   metFORMIN (GLUCOPHAGE) 500 MG tablet, Take 500 mg by mouth 2 (two) times daily., Disp: , Rfl:    Multiple Vitamin (MULTIVITAMIN) tablet, Take 1 tablet by mouth daily., Disp: , Rfl:    sitaGLIPtin (JANUVIA) 100 MG tablet, Take 1 tablet (100 mg total) by mouth daily., Disp: 30 tablet, Rfl: 2   tamsulosin (FLOMAX) 0.4 MG CAPS capsule, TAKE 1 CAPSULE BY MOUTH TWICE A DAY, Disp: 60 capsule, Rfl: 11   temazepam (RESTORIL) 30 MG capsule, Take 30 mg by mouth at bedtime., Disp: , Rfl:    Vitamin D, Ergocalciferol, (DRISDOL) 1.25 MG (50000 UNIT) CAPS capsule, Take 50,000 Units by mouth every 7 (seven) days., Disp: , Rfl:    VRAYLAR 6 MG CAPS, Take 6 mg by mouth every morning., Disp: , Rfl:    Continuous Glucose Receiver (FREESTYLE LIBRE 3 READER) DEVI, Use to check blood sugar 3 (three) times daily before meals., Disp: 1 each, Rfl: 0   Continuous Glucose Sensor (FREESTYLE LIBRE 3 SENSOR) MISC, Place 1 sensor on the skin every 14 days. Use to check glucose  continuously 3 times a day before meals, Disp:  2 each, Rfl: 3   finasteride (PROSCAR) 5 MG tablet, TAKE 1 TABLET BY MOUTH EVERY DAY (Patient not taking: Reported on 09/24/2023), Disp: 90 tablet, Rfl: 3   furosemide (LASIX) 20 MG tablet, Take 20 mg by mouth daily as needed for fluid or edema., Disp: , Rfl:    nystatin cream (MYCOSTATIN), Apply to affected area 2 times daily (Patient not taking: Reported on 09/24/2023), Disp: 30 g, Rfl: 0   oxybutynin (DITROPAN) 5 MG tablet, Take 5 mg by mouth daily. (Patient not taking: Reported on 09/24/2023), Disp: , Rfl:    Tart Cherry 500 MG CAPS, Take 500 mg by mouth daily. (Patient not taking: Reported on 09/24/2023), Disp: , Rfl:    temazepam (RESTORIL) 15 MG capsule, Take 15 mg by mouth at bedtime. (Patient not taking: Reported on 09/24/2023), Disp: , Rfl: 5  Allergies: No Known Allergies  Nanine Means, NP

## 2023-09-25 NOTE — Progress Notes (Signed)
  Progress Note   Patient: Edwin [name] MVH:[id number] DOB: 02/02/1956 DOA: 09/24/2023     1 DOS: the patient was seen and examined on 09/25/2023   Brief hospital course: 68 year old man PMH including diabetes mellitus type 2 and schizophrenia, who fell at home and was found to be markedly hypoglycemic.  Admitted for further evaluation.  Thought to be secondary to poor intake in the context of ongoing oral medications.  Consultants None   Procedures/Events None   Assessment and Plan: * Hypoglycemia secondary to medication and poor oral intake Oral intake improved but still was hypoglycemic this morning requiring D50. Will stop dextrose infusion now and monitor blood sugars closely.  Fall at home, initial encounter Presumably secondary to hypoglycemia.  Imaging negative.  Chronic indwelling Foley catheter   Possible CKD stage IIIa, cannot say for sure will need outpatient follow-up Baseline creatinine around 1.3-12.6  Schizophrenia Appears stable.     Subjective: Feels better  Physical Exam: Vitals:   09/25/23 0730 09/25/23 0800 09/25/23 0906 09/25/23 1100  BP: (!) 106/59 98/64  (!) 96/51  Pulse: 69 73  98  Resp:  18  18  Temp:   98 F (36.7 C)   TempSrc:   Oral   SpO2: 95% 95%  93%  Weight:      Height:       Physical Exam Vitals reviewed.  Constitutional:      General: He is not in acute distress.    Appearance: He is not ill-appearing or toxic-appearing.  Cardiovascular:     Rate and Rhythm: Normal rate and regular rhythm.     Heart sounds: No murmur heard. Pulmonary:     Effort: Pulmonary effort is normal. No respiratory distress.     Breath sounds: No wheezing, rhonchi or rales.  Neurological:     Mental Status: He is alert.  Psychiatric:        Mood and Affect: Mood normal.        Behavior: Behavior normal.     Comments: Mentation a bit slow but answers questions appropriately     Data Reviewed: Sodium 130, creatinine 1.61, no significant  change since yesterday or since November 2024 Minimal leukocytosis 11.7 2 episodes of hypoglycemia this morning  Family Communication: Dialed brother's number, no answer.  Dialed niece's number, "call could not be completed"  Disposition: Status is: Inpatient Remains inpatient appropriate because: hypoglycemic  Planned Discharge Destination: Home    Time spent: 20 minutes  Author: Brendia Sacks, MD 09/25/2023 12:22 PM  For on call review [email or web address].

## 2023-09-25 NOTE — ED Notes (Signed)
 Noticed swelling in left arm. Removed pts IV

## 2023-09-25 NOTE — ED Notes (Addendum)
 Gave pt water with ice RN made aware, emptied urinal

## 2023-09-25 NOTE — ED Notes (Signed)
Waiting for meds from pharmacy. 

## 2023-09-25 NOTE — ED Notes (Addendum)
 CBG done... result was 40 RN notified. Docked glucometer but for some reason not transferring the data over.

## 2023-09-25 NOTE — Progress Notes (Signed)
 Able to reach patient's next of kin Elhadji Pecore, updated on patient's admission to 5E.

## 2023-09-26 DIAGNOSIS — E162 Hypoglycemia, unspecified: Secondary | ICD-10-CM | POA: Diagnosis not present

## 2023-09-26 DIAGNOSIS — F200 Paranoid schizophrenia: Secondary | ICD-10-CM | POA: Diagnosis not present

## 2023-09-26 DIAGNOSIS — N1831 Chronic kidney disease, stage 3a: Secondary | ICD-10-CM | POA: Diagnosis not present

## 2023-09-26 LAB — GLUCOSE, CAPILLARY
Glucose-Capillary: 126 mg/dL — ABNORMAL HIGH (ref 70–99)
Glucose-Capillary: 130 mg/dL — ABNORMAL HIGH (ref 70–99)
Glucose-Capillary: 136 mg/dL — ABNORMAL HIGH (ref 70–99)
Glucose-Capillary: 149 mg/dL — ABNORMAL HIGH (ref 70–99)
Glucose-Capillary: 151 mg/dL — ABNORMAL HIGH (ref 70–99)
Glucose-Capillary: 152 mg/dL — ABNORMAL HIGH (ref 70–99)
Glucose-Capillary: 152 mg/dL — ABNORMAL HIGH (ref 70–99)
Glucose-Capillary: 158 mg/dL — ABNORMAL HIGH (ref 70–99)
Glucose-Capillary: 166 mg/dL — ABNORMAL HIGH (ref 70–99)

## 2023-09-26 LAB — BASIC METABOLIC PANEL
Anion gap: 10 (ref 5–15)
BUN: 15 mg/dL (ref 8–23)
CO2: 23 mmol/L (ref 22–32)
Calcium: 8.7 mg/dL — ABNORMAL LOW (ref 8.9–10.3)
Chloride: 97 mmol/L — ABNORMAL LOW (ref 98–111)
Creatinine, Ser: 1.68 mg/dL — ABNORMAL HIGH (ref 0.61–1.24)
GFR, Estimated: 45 mL/min — ABNORMAL LOW (ref >60)
Glucose, Bld: 153 mg/dL — ABNORMAL HIGH (ref 70–99)
Potassium: 3.7 mmol/L (ref 3.5–5.1)
Sodium: 130 mmol/L — ABNORMAL LOW (ref 135–145)

## 2023-09-26 MED ORDER — ENSURE ENLIVE PO LIQD
237.0000 mL | Freq: Two times a day (BID) | ORAL | Status: DC
  Administered 2023-09-26 (×2): 237 mL via ORAL

## 2023-09-26 MED ORDER — TRAZODONE HCL 50 MG PO TABS: 50.0000 mg | ORAL_TABLET | Freq: Every day | ORAL | Status: DC

## 2023-09-26 MED ORDER — LACTATED RINGERS IV BOLUS
1000.0000 mL | Freq: Once | INTRAVENOUS | Status: AC
  Administered 2023-09-26: 1000 mL via INTRAVENOUS

## 2023-09-26 MED ORDER — BENZTROPINE MESYLATE 0.5 MG PO TABS
0.5000 mg | ORAL_TABLET | Freq: Every day | ORAL | Status: DC
  Administered 2023-09-26: 0.5 mg via ORAL
  Filled 2023-09-26 (×2): qty 1

## 2023-09-26 MED ORDER — LACTATED RINGERS IV SOLN: INTRAVENOUS | Status: DC

## 2023-09-26 NOTE — Discharge Instructions (Signed)
 [facility] [employer] [facility] offers a [employer] that anyone can call toll free at [phone]. The friendship line is available 24 hours a day and you can call in whenever you like and receive calls from them.  [place] [address] also has on demand information for what resources are available in the area.

## 2023-09-26 NOTE — Consult Note (Signed)
 [facility] Psychiatric Consult follow up  Patient Name: .Edwin [name]  MRN: [id number]  DOB: [birth year]  Consult Order details:  Orders (From admission, onward)     Start     Ordered   09/24/23 2319  IP CONSULT TO PSYCHIATRY       Ordering Provider: Nolberto Hanlon, MD  Provider:  (Not yet assigned)  Question Answer Comment  Location [facility]   Reason for Consult? moderately uncontroled schizophrenia.      09/24/23 2322             Mode of Visit: In person    Psychiatry Consult Evaluation  Service Date: September 26, 2023 LOS:  LOS: 2 days  Chief Complaint Hypoglycemia   Primary Psychiatric Diagnoses  Paranoid schizophrenia, chronic condition   Assessment  UNIQUE SEARFOSS is a 68 y.o. male admitted: Medicallyfor 09/24/2023  7:07 PM for hypoglycemia. He carries the psychiatric diagnoses of paranoid schizophrenia and has a past medical history of  prostate cancer, CKD 3, GERD, HLD.  He meets criteria for schizophrenia  based on auditory and visual hallucinations, long history of antipsychotic use.  Current outpatient psychotropic medications include Cogentin, Temazepam, Caplyta and Vraylar.  He reports he has been compliant with medications prior to admission. On initial examination, patient was disheveled with blunt effect. Please see plan below for detailed recommendations.   09/26/23: The client was resting in his bed prior to the assessment.  He denied depression, moderate level of anxiety.  His appetite is still low. He does not feel the Restoril worked well last night, denies pain issues.  His hallucinations have not changed, at his baseline, chronic hallucinations.  The client is pleasant on assessment, not expansive on his answers to questions.  It is easier to understand his speech today.  Diagnoses:  Active Hospital problems: Principal Problem:   Hypoglycemia Active Problems:   Paranoid schizophrenia, chronic condition (HCC)   Chronic kidney  disease, stage 3a (HCC)   Schizophrenia (HCC)   Fall at home, initial encounter    Plan   ## Psychiatric Medication Recommendations:  Restart Captylta 42 mg daily Continue Restoril 30 mg daily Started Trazodone 50 mg daily  Restarted Cogentin 0.5 mg daily Confirmed his medications with his outpatient facility, [facility]   ## Medical Decision Making Capacity: Not specifically addressed in this encounter  ## Further Work-up:  -- most recent EKG on 09/24/2023 had QtC of 435 -- Pertinent labwork reviewed earlier this admission includes: CBC, CMP, UA, Glucose, EKG   ## Disposition:-- There are no psychiatric contraindications to discharge at this time  ## Behavioral / Environmental: - No specific recommendations at this time.     ## Safety and Observation Level:  - Based on my clinical evaluation, I estimate the patient to be at low risk of self harm in the current setting. - At this time, we recommend  routine. This decision is based on my review of the chart including patient's history and current presentation, interview of the patient, mental status examination, and consideration of suicide risk including evaluating suicidal ideation, plan, intent, suicidal or self-harm behaviors, risk factors, and protective factors. This judgment is based on our ability to directly address suicide risk, implement suicide prevention strategies, and develop a safety plan while the patient is in the clinical setting. Please contact our team if there is a concern that risk level has changed.  CSSR Risk Category:C-SSRS RISK CATEGORY: No Risk  Suicide Risk Assessment: Patient has following modifiable  risk factors for suicide: under treated symptoms which we are addressing by medication management. Patient has following non-modifiable or demographic risk factors for suicide: male gender Patient has the following protective factors against suicide: Supportive family and no history of suicide  attempts  Thank you for this consult request. Recommendations have been communicated to the primary team.  We will continue to follow at this time.   Nanine Means, NP       History of Present Illness  Relevant Aspects of [facility] Course:  Admitted on 09/24/2023 for hypoglycemia.   Patient Report:  Upon entering his room, the client was sitting up finishing his breakfast. The client reports "I am feeling better.". His appetite is good as evidence by finishing his breakfast. He rates his depression as mild and denies suicidal ideation. The client says he worries often which is related to him needing more food at home. He denies psych medication side effects and states "I think they are working good.". He believes the he occasionally sees girls around him and confirms that no one else seems them. When he closes his eyes they go away. The client states "I do hear some voices. I don't know what they say. I was alright this morning.". He denies that the command things of him and denies homicidal ideation. The client denies substance and tobacco use. He lives with his niece, [name], and her boyfriend [name], who comes over to the house often. He confirms feeling safe in the house and having access to food and water.  The client was difficult to understand on assessment.   Psych ROS:  Depression: mild Anxiety:  mild Mania (lifetime and current): None reported Psychosis: (lifetime and current): None reported  Collateral information:  Contacted Jillian Pianka at [phone] at 11:41 on 09/25/2023.  "When his blood sugars get low, he goes crazy.  As soon his blood sugar is back up, he's back to his old self".  His brother is very involved in his care and knowledgeable of his appointments and medications.  The medical doctors changed his diabetes medications and his blood sugars are getting too low.  Review of Systems  Constitutional: Negative.   HENT: Negative.    Eyes: Negative.   Respiratory:  Negative.    Cardiovascular: Negative.   Gastrointestinal: Negative.   Genitourinary: Negative.   Musculoskeletal: Negative.   Skin: Negative.   Neurological: Negative.   Endo/Heme/Allergies: Negative.   Psychiatric/Behavioral:  Positive for hallucinations. The patient is nervous/anxious.      Psychiatric and Social History  Psychiatric History:  Information collected from client and EHR  Prev Dx/Sx: none Current Psych Provider: Dr. Voncille Lo Home Meds (current): Caplyta 42mg , Cogentin 0.5mg , Temazapem 15mg , Vryalyar 6mg  Previous Med Trials: none reported Therapy: none reported  Prior Psych Hospitalization: none reported/ none documented   Prior Self Harm: none reported Prior Violence: none reported  Family Psych History: none reported Family Hx suicide: none reported  Social History:   Legal Hx: none  Living Situation: With his niece, Shana Spiritual Hx: denies Access to weapons/lethal means: No   Substance History Alcohol: Denies   History of alcohol withdrawal seizures Denies History of DT's Denies Tobacco: Denies Illicit drugs: Denies Prescription drug abuse: Denies Rehab hx: Denies  Exam Findings  Physical Exam: Client resting in bed, disheveled finishing his breakfast.  Vital Signs:  Temp:  [97.7 F (36.5 C)-98.5 F (36.9 C)] 98.5 F (36.9 C) (02/27 0529) Pulse Rate:  [70-98] 75 (02/27 0529) Resp:  [16-18] 18 (02/27  0529) BP: (90-102)/(51-74) 100/62 (02/27 0529) SpO2:  [92 %-97 %] 95 % (02/27 0529) Blood pressure 100/62, pulse 75, temperature 98.5 F (36.9 C), resp. rate 18, height 5\' 11"  (1.803 m), weight 88.4 kg, SpO2 95%. Body mass index is 27.18 kg/m.  Physical Exam Vitals and nursing note reviewed.  HENT:     Nose: Nose normal.  Pulmonary:     Effort: Pulmonary effort is normal.  Musculoskeletal:        General: Normal range of motion.     Cervical back: Normal range of motion.  Neurological:     General: No focal deficit present.      Mental Status: He is alert and oriented to person, place, and time.     Mental Status Exam: General Appearance: Casual  Orientation:  Full (Time, Place, and Person)  Memory:  Immediate;   Fair Recent;   Fair Remote;   Fair  Concentration:  Concentration: Good and Attention Span: Good  Recall:  Fair  Attention  Good  Eye Contact:  Fair  Speech:  Normal Rate  Language:  Fair  Volume:  Decreased  Mood: Anxious   Affect:  Blunt  Thought Process:  Coherent  Thought Content:  Hallucinations: Auditory Visual, chronic  Suicidal Thoughts:  No  Homicidal Thoughts:  No  Judgement:  Fair  Insight:  Fair  Psychomotor Activity:  Decreased  Akathisia:  NA  Fund of Knowledge:  Fair      Assets:  [employer]  Cognition:  WDL  ADL's:  Intact  AIMS (if indicated):        Other History   These have been pulled in through the EMR, reviewed, and updated if appropriate.  Family History:  The patient's family history includes Breast cancer in his mother.  Medical History: Past Medical History:  Diagnosis Date   Anxiety    Cancer (HCC)    prostate   CKD (chronic kidney disease) stage 3, GFR 30-59 ml/min (HCC)    Depression    GERD (gastroesophageal reflux disease)    under control   History of Clostridium difficile colitis    03-31-2015--  resolved   History of sepsis    secondary to UTI and ARF  x2 admission 03-31-2015 &  04-14-2015   Hyperlipidemia    Paranoid schizophrenia [facility])    Urinary incontinence     Surgical History: Past Surgical History:  Procedure Laterality Date   CERVICAL DISC SURGERY  1990's   CYSTOSCOPY N/A 03/08/2015   Procedure: CYSTOSCOPY;  Surgeon: Su Grand, MD;  Location: [facility];  Service: Urology;  Laterality: N/A;   CYSTOSCOPY  06/10/2015   Procedure: CYSTOSCOPY;  Surgeon: Malen Gauze, MD;  Location: [facility];  Service: Urology;;   PROSTATE BIOPSY N/A 03/08/2015    Procedure: BIOPSY TRANSRECTAL ULTRASONIC PROSTATE (TUBP);  Surgeon: Su Grand, MD;  Location: [facility];  Service: Urology;  Laterality: N/A;   TRANSTHORACIC ECHOCARDIOGRAM  06-11-2015   grade 1 diastolic dysfunction, ef 55-60%;  trivial TR   TRANSURETHRAL RESECTION OF BLADDER TUMOR N/A 03/08/2015   Procedure: TRANSURETHRAL RESECTION OF BLADDER TUMOR (TURBT);  Surgeon: Su Grand, MD;  Location: [facility];  Service: Urology;  Laterality: N/A;  TUR BIOPSY BLADDER   TRANSURETHRAL RESECTION OF PROSTATE N/A 06/27/2015   Procedure: TRANSURETHRAL RESECTION OF THE PROSTATE WITH GYRUS INSTRUMENTS;  Surgeon: Malen Gauze, MD;  Location[facility];  Service: Urology;  Laterality: N/A;   TRANSURETHRAL RESECTION OF  PROSTATE N/A 07/09/2016   Procedure: TRANSURETHRAL RESECTION OF THE PROSTATE (TURP);  Surgeon: Malen Gauze, MD;  Location: [facility];  Service: Urology;  Laterality: N/A;     Medications:   Current Facility-Administered Medications:    acetaminophen (TYLENOL) tablet 650 mg, 650 mg, Oral, Q6H PRN **OR** acetaminophen (TYLENOL) suppository 650 mg, 650 mg, Rectal, Q6H PRN, Nolberto Hanlon, MD   aspirin EC tablet 81 mg, 81 mg, Oral, Daily, Standley Brooking, MD, 81 mg at 09/26/23 4098   benztropine (COGENTIN) tablet 0.5 mg, 0.5 mg, Oral, Daily, Shaine Mount, Herminio Heads, NP   bethanechol (URECHOLINE) tablet 5 mg, 5 mg, Oral, BID, Nolberto Hanlon, MD, 5 mg at 09/26/23 1191   Chlorhexidine Gluconate Cloth 2 % PADS 6 each, 6 each, Topical, Daily, Anthoney Harada, NP   enoxaparin (LOVENOX) injection 40 mg, 40 mg, Subcutaneous, Q24H, Nolberto Hanlon, MD, 40 mg at 09/26/23 4782   feeding supplement (ENSURE ENLIVE / ENSURE PLUS) liquid 237 mL, 237 mL, Oral, BID BM, Anthoney Harada, NP   finasteride (PROSCAR) tablet 5 mg, 5 mg, Oral, Daily, Nolberto Hanlon, MD, 5 mg at 09/26/23 9562   influenza vaccine adjuvanted (FLUAD) injection 0.5 mL, 0.5 mL, Intramuscular, Tomorrow-1000, Standley Brooking, MD   insulin aspart (novoLOG) injection 0-6 Units, 0-6 Units, Subcutaneous, Q4H, Nolberto Hanlon, MD   ipratropium-albuterol (DUONEB) 0.5-2.5 (3) MG/3ML nebulizer solution 3 mL, 3 mL, Nebulization, Q6H PRN, Nolberto Hanlon, MD   lactated ringers bolus 1,000 mL, 1,000 mL, Intravenous, Once, Standley Brooking, MD   lactated ringers infusion, , Intravenous, Continuous, Standley Brooking, MD   lumateperone tosylate (CAPLYTA) capsule 42 mg, 42 mg, Oral, q morning, Nolberto Hanlon, MD, 42 mg at 09/26/23 1308   pneumococcal 20-valent conjugate vaccine (PREVNAR 20) injection 0.5 mL, 0.5 mL, Intramuscular, Tomorrow-1000, Standley Brooking, MD   polyethylene glycol (MIRALAX / GLYCOLAX) packet 17 g, 17 g, Oral, Daily PRN, Nolberto Hanlon, MD   senna-docusate (Senokot-S) tablet 2 tablet, 2 tablet, Oral, BID, Nolberto Hanlon, MD, 2 tablet at 09/26/23 6578   sodium chloride flush (NS) 0.9 % injection 3 mL, 3 mL, Intravenous, Q12H, Nolberto Hanlon, MD, 3 mL at 09/26/23 0840   tamsulosin (FLOMAX) capsule 0.4 mg, 0.4 mg, Oral, BID, Nolberto Hanlon, MD, 0.4 mg at 09/26/23 0834   temazepam (RESTORIL) capsule 30 mg, 30 mg, Oral, QHS, Nolberto Hanlon, MD, 30 mg at 09/25/23 2119  Allergies: No Known Allergies  Nanine Means, NP

## 2023-09-26 NOTE — Discharge Summary (Signed)
 Physician Discharge Summary   Patient: Edwin [name] MRN: [id number] DOB: August 20, 1955  Admit date:     09/24/2023  Discharge date: 09/26/23  Discharge Physician: Brendia Sacks   PCP: [facility]   Recommendations at discharge:   Given recurrent hypoglycemia, stopped Amaryl, metformin and Jardiance.  Do not expect metformin or Jardiance to typically cause hypoglycemia.  Consider restarting these medications if blood sugars are over 200.  Would avoid Amaryl.  Follow-up renal function, see below.  Evaluate for CKD.  Ongoing care for schizophrenia  Discharge Diagnoses: Principal Problem:   Hypoglycemia Active Problems:   Paranoid schizophrenia, chronic condition (HCC)   Chronic kidney disease, stage 3a (HCC)   Schizophrenia (HCC)   Fall at home, initial encounter  Resolved Problems:   * No resolved hospital problems. *  Hospital Course: 68 year old man PMH including diabetes mellitus type 2 and schizophrenia, who fell at home and was found to be markedly hypoglycemic.  Admitted for further evaluation.  Thought to be secondary to poor intake in the context of ongoing oral medications.  Hypoglycemia gradually corrected with discontinuation of oral antihyperglycemics.  Mentally at baseline per brother at bedside.  Stable for discharge home.  Consultants Psychiatry    Procedures/Events None   * Hypoglycemia secondary to medication and poor oral intake Now resolved.  Secondary to Amaryl.  Would not expect hypoglycemia from metformin or Jardiance. Nevertheless blood sugars have been well under 200 without medications.  Will hold all 3 medications.  Patient does not check blood sugars at home and will continue to do so.  If blood sugars are above 200 restart metformin.  Was previously on an antipsychotic that can cause hyperglycemia this was stopped somewhat recently and may be the reason why previous regimen for diabetes is now causing hypoglycemia.   Fall at home, initial  encounter Presumably secondary to hypoglycemia.  Imaging negative.   Chronic indwelling Foley catheter    Possible CKD stage IIIa, cannot say for sure will need outpatient follow-up Baseline creatinine around 1.3-1.66   Schizophrenia Appears stable.  Disposition: Home Diet recommendation:  Discharge Diet Orders (From admission, onward)     Start     Ordered   09/26/23 0000  Diet Carb Modified        09/26/23 1702            DISCHARGE MEDICATION: Allergies as of 09/26/2023   No Known Allergies      Medication List     STOP taking these medications    benztropine 0.5 MG tablet Commonly known as: COGENTIN   glimepiride 4 MG tablet Commonly known as: AMARYL   Januvia 100 MG tablet Generic drug: sitaGLIPtin   metFORMIN 500 MG tablet Commonly known as: GLUCOPHAGE   nystatin cream Commonly known as: MYCOSTATIN   oxybutynin 5 MG tablet Commonly known as: DITROPAN       TAKE these medications    acetaminophen 325 MG tablet Commonly known as: TYLENOL Take 650 mg by mouth every 6 (six) hours as needed for moderate pain.   aspirin EC 81 MG tablet Take 81 mg by mouth daily.   bethanechol 5 MG tablet Commonly known as: URECHOLINE TAKE 1 TABLET (5 MG TOTAL) BY MOUTH IN THE MORNING AND AT BEDTIME   Caplyta 42 MG capsule Generic drug: lumateperone tosylate Take 42 mg by mouth every morning.   Combivent Respimat 20-100 MCG/ACT Aers respimat Generic drug: Ipratropium-Albuterol INHALE 1 PUFF INTO THE LUNGS EVERY 6 HOURS AS NEEDED FOR WHEEZING OR  SHORTNESS OF BREATH. What changed: See the new instructions.   Ex-Lax 15 MG Chew Generic drug: Sennosides Chew 15 mg by mouth every 6 (six) hours as needed (for constipation).   finasteride 5 MG tablet Commonly known as: PROSCAR TAKE 1 TABLET BY MOUTH EVERY DAY   FreeStyle Libre 3 Reader Devi Use to check blood sugar 3 (three) times daily before meals.   FreeStyle Libre 3 Sensor Misc Place 1 sensor on the  skin every 14 days. Use to check glucose continuously 3 times a day before meals   furosemide 20 MG tablet Commonly known as: LASIX Take 20 mg by mouth daily as needed for fluid or edema.   multivitamin tablet Take 1 tablet by mouth daily.   tamsulosin 0.4 MG Caps capsule Commonly known as: FLOMAX TAKE 1 CAPSULE BY MOUTH TWICE A DAY   Tart Cherry 500 MG Caps Take 500 mg by mouth daily.   temazepam 30 MG capsule Commonly known as: RESTORIL Take 30 mg by mouth at bedtime. What changed: Another medication with the same name was removed. Continue taking this medication, and follow the directions you see here.   Vitamin D (Ergocalciferol) 1.25 MG (50000 UNIT) Caps capsule Commonly known as: DRISDOL Take 50,000 Units by mouth every 7 (seven) days.   Vraylar 6 MG Caps Generic drug: Cariprazine HCl Take 6 mg by mouth every morning.        Follow-up Information     [facility]. Schedule an appointment as soon as possible for a visit in 1 week(s).   Specialty: Internal Medicine Contact information: [address] [place] [address] [phone]                Feels fine Wants to go home  Discharge Exam: Ceasar Mons Weights   09/24/23 1936  Weight: 88.4 kg   Physical Exam Vitals reviewed.  Constitutional:      General: He is not in acute distress.    Appearance: He is not ill-appearing or toxic-appearing.  Cardiovascular:     Rate and Rhythm: Normal rate and regular rhythm.     Heart sounds: No murmur heard. Pulmonary:     Effort: Pulmonary effort is normal. No respiratory distress.     Breath sounds: No wheezing, rhonchi or rales.  Neurological:     Mental Status: He is alert.  Psychiatric:        Mood and Affect: Mood normal.        Behavior: Behavior normal.      Condition at discharge: good  The results of significant diagnostics from this hospitalization (including imaging, microbiology, ancillary and laboratory) are listed below for  reference.   Imaging Studies: CT Cervical Spine Wo Contrast Result Date: 09/24/2023 CLINICAL DATA:  Found down, neck trauma EXAM: CT CERVICAL SPINE WITHOUT CONTRAST TECHNIQUE: Multidetector CT imaging of the cervical spine was performed without intravenous contrast. Multiplanar CT image reconstructions were also generated. RADIATION DOSE REDUCTION: This exam was performed according to the departmental dose-optimization program which includes automated exposure control, adjustment of the mA and/or kV according to patient size and/or use of iterative reconstruction technique. COMPARISON:  06/11/2023 FINDINGS: Alignment: Alignment is grossly anatomic. Skull base and vertebrae: No acute fracture. No primary bone lesion or focal pathologic process. Soft tissues and spinal canal: No prevertebral fluid or swelling. No visible canal hematoma. Disc levels: Prior C4-5 ACDF. Stable spondylosis with prominent anterior osteophyte formation at C3-4, C5-6, and C6-7. Upper chest: Airway is patent.  Lung apices are clear.  Other: Reconstructed images demonstrate no additional findings. IMPRESSION: 1. No acute cervical spine fracture. 2. Stable postsurgical and degenerative changes. Electronically Signed   By: Sharlet Salina M.D.   On: 09/24/2023 21:11   CT Head Wo Contrast Result Date: 09/24/2023 CLINICAL DATA:  Found down, head trauma EXAM: CT HEAD WITHOUT CONTRAST TECHNIQUE: Contiguous axial images were obtained from the base of the skull through the vertex without intravenous contrast. RADIATION DOSE REDUCTION: This exam was performed according to the departmental dose-optimization program which includes automated exposure control, adjustment of the mA and/or kV according to patient size and/or use of iterative reconstruction technique. COMPARISON:  06/11/2023 FINDINGS: Brain: No acute infarct or hemorrhage. Lateral ventricles and midline structures are unremarkable. No acute extra-axial fluid collections. No mass effect.  Vascular: No hyperdense vessel or unexpected calcification. Skull: Normal. Negative for fracture or focal lesion. Sinuses/Orbits: No acute finding. Other: None. IMPRESSION: 1. No acute intracranial process. Electronically Signed   By: Sharlet Salina M.D.   On: 09/24/2023 21:09    Microbiology: Results for orders placed or performed during the hospital encounter of 06/11/23  Urine Culture     Status: Abnormal   Collection Time: 06/11/23  4:03 PM   Specimen: Urine, Catheterized  Result Value Ref Range Status   Specimen Description   Final    URINE, CATHETERIZED Performed at [facility], [address] [place], [place] [address]    Special Requests   Final    NONE Performed at [facility], [address], [place], [place] [address]    Culture >=100,000 COLONIES/mL STENOTROPHOMONAS MALTOPHILIA (A)  Final   Report Status 06/14/2023 FINAL  Final   Organism ID, Bacteria STENOTROPHOMONAS MALTOPHILIA (A)  Final      Susceptibility   Stenotrophomonas maltophilia - MIC*    LEVOFLOXACIN 0.5 SENSITIVE Sensitive     TRIMETH/SULFA <=20 SENSITIVE Sensitive     * >=100,000 COLONIES/mL STENOTROPHOMONAS MALTOPHILIA    Labs: CBC: Recent Labs  Lab 09/24/23 1949 09/25/23 0343  WBC 13.1* 11.7*  HGB 12.3* 12.5*  HCT 38.3* 38.6*  MCV 79.3* 78.8*  PLT 303 293   Basic Metabolic Panel: Recent Labs  Lab 09/24/23 1949 09/25/23 0343 09/26/23 0552  NA 132* 130* 130*  K 3.1* 3.5 3.7  CL 95* 96* 97*  CO2 25 24 23   GLUCOSE 57* 53* 153*  BUN 18 15 15   CREATININE 1.58* 1.61* 1.68*  CALCIUM 8.7* 8.6* 8.7*   Liver Function Tests: Recent Labs  Lab 09/24/23 1949  AST 12*  ALT 9  ALKPHOS 65  BILITOT 0.7  PROT 7.1  ALBUMIN 3.2*   CBG: Recent Labs  Lab 09/26/23 0739 09/26/23 0957 09/26/23 1159 09/26/23 1434 09/26/23 1627  GLUCAP 136* 151* 126* 130* 149*    Discharge time spent: less than 30 minutes.  Signed: Brendia Sacks, MD [facility] 09/26/2023

## 2023-09-30 ENCOUNTER — Inpatient Hospital Stay (HOSPITAL_COMMUNITY)
Admission: EM | Admit: 2023-09-30 | Discharge: 2023-10-02 | DRG: 698 | Disposition: A | Discharge status: Home Health | Attending: Internal Medicine | Admitting: Internal Medicine

## 2023-09-30 ENCOUNTER — Emergency Department (HOSPITAL_COMMUNITY)

## 2023-09-30 DIAGNOSIS — Z79899 Other long term (current) drug therapy: Secondary | ICD-10-CM

## 2023-09-30 DIAGNOSIS — E871 Hypo-osmolality and hyponatremia: Secondary | ICD-10-CM | POA: Diagnosis present

## 2023-09-30 DIAGNOSIS — N390 Urinary tract infection, site not specified: Secondary | ICD-10-CM | POA: Diagnosis present

## 2023-09-30 DIAGNOSIS — E11649 Type 2 diabetes mellitus with hypoglycemia without coma: Secondary | ICD-10-CM | POA: Diagnosis present

## 2023-09-30 DIAGNOSIS — F17210 Nicotine dependence, cigarettes, uncomplicated: Secondary | ICD-10-CM | POA: Diagnosis present

## 2023-09-30 DIAGNOSIS — Z7982 Long term (current) use of aspirin: Secondary | ICD-10-CM | POA: Diagnosis not present

## 2023-09-30 DIAGNOSIS — Z9079 Acquired absence of other genital organ(s): Secondary | ICD-10-CM

## 2023-09-30 DIAGNOSIS — Y846 Urinary catheterization as the cause of abnormal reaction of the patient, or of later complication, without mention of misadventure at the time of the procedure: Secondary | ICD-10-CM | POA: Diagnosis present

## 2023-09-30 DIAGNOSIS — N1831 Chronic kidney disease, stage 3a: Secondary | ICD-10-CM | POA: Diagnosis present

## 2023-09-30 DIAGNOSIS — N179 Acute kidney failure, unspecified: Secondary | ICD-10-CM | POA: Diagnosis present

## 2023-09-30 DIAGNOSIS — N400 Enlarged prostate without lower urinary tract symptoms: Secondary | ICD-10-CM | POA: Diagnosis present

## 2023-09-30 DIAGNOSIS — A419 Sepsis, unspecified organism: Secondary | ICD-10-CM | POA: Diagnosis present

## 2023-09-30 DIAGNOSIS — E162 Hypoglycemia, unspecified: Secondary | ICD-10-CM | POA: Diagnosis present

## 2023-09-30 DIAGNOSIS — E785 Hyperlipidemia, unspecified: Secondary | ICD-10-CM | POA: Diagnosis present

## 2023-09-30 DIAGNOSIS — E860 Dehydration: Secondary | ICD-10-CM | POA: Diagnosis present

## 2023-09-30 DIAGNOSIS — R6520 Severe sepsis without septic shock: Secondary | ICD-10-CM | POA: Diagnosis present

## 2023-09-30 DIAGNOSIS — F419 Anxiety disorder, unspecified: Secondary | ICD-10-CM | POA: Diagnosis present

## 2023-09-30 DIAGNOSIS — E861 Hypovolemia: Secondary | ICD-10-CM | POA: Diagnosis present

## 2023-09-30 DIAGNOSIS — F200 Paranoid schizophrenia: Secondary | ICD-10-CM | POA: Diagnosis present

## 2023-09-30 DIAGNOSIS — T83518A Infection and inflammatory reaction due to other urinary catheter, initial encounter: Secondary | ICD-10-CM | POA: Diagnosis present

## 2023-09-30 DIAGNOSIS — Z1152 Encounter for screening for COVID-19: Secondary | ICD-10-CM | POA: Diagnosis not present

## 2023-09-30 DIAGNOSIS — Z7984 Long term (current) use of oral hypoglycemic drugs: Secondary | ICD-10-CM | POA: Diagnosis not present

## 2023-09-30 DIAGNOSIS — E876 Hypokalemia: Secondary | ICD-10-CM | POA: Diagnosis present

## 2023-09-30 DIAGNOSIS — E1165 Type 2 diabetes mellitus with hyperglycemia: Secondary | ICD-10-CM | POA: Diagnosis present

## 2023-09-30 DIAGNOSIS — Z803 Family history of malignant neoplasm of breast: Secondary | ICD-10-CM

## 2023-09-30 DIAGNOSIS — E1122 Type 2 diabetes mellitus with diabetic chronic kidney disease: Secondary | ICD-10-CM | POA: Diagnosis present

## 2023-09-30 DIAGNOSIS — R197 Diarrhea, unspecified: Secondary | ICD-10-CM | POA: Diagnosis present

## 2023-09-30 DIAGNOSIS — Z8619 Personal history of other infectious and parasitic diseases: Secondary | ICD-10-CM | POA: Diagnosis not present

## 2023-09-30 DIAGNOSIS — E119 Type 2 diabetes mellitus without complications: Secondary | ICD-10-CM

## 2023-09-30 LAB — BASIC METABOLIC PANEL
Anion gap: 8 (ref 5–15)
BUN: 22 mg/dL (ref 8–23)
CO2: 19 mmol/L — ABNORMAL LOW (ref 22–32)
Calcium: 7.5 mg/dL — ABNORMAL LOW (ref 8.9–10.3)
Chloride: 104 mmol/L (ref 98–111)
Creatinine, Ser: 1.57 mg/dL — ABNORMAL HIGH (ref 0.61–1.24)
GFR, Estimated: 49 mL/min — ABNORMAL LOW (ref >60)
Glucose, Bld: 73 mg/dL (ref 70–99)
Potassium: 4.1 mmol/L (ref 3.5–5.1)
Sodium: 131 mmol/L — ABNORMAL LOW (ref 135–145)

## 2023-09-30 LAB — COMPREHENSIVE METABOLIC PANEL
ALT: 17 U/L (ref 0–44)
AST: 17 U/L (ref 15–41)
Albumin: 2.7 g/dL — ABNORMAL LOW (ref 3.5–5.0)
Alkaline Phosphatase: 57 U/L (ref 38–126)
Anion gap: 11 (ref 5–15)
BUN: 30 mg/dL — ABNORMAL HIGH (ref 8–23)
CO2: 20 mmol/L — ABNORMAL LOW (ref 22–32)
Calcium: 8 mg/dL — ABNORMAL LOW (ref 8.9–10.3)
Chloride: 96 mmol/L — ABNORMAL LOW (ref 98–111)
Creatinine, Ser: 2.08 mg/dL — ABNORMAL HIGH (ref 0.61–1.24)
GFR, Estimated: 35 mL/min — ABNORMAL LOW (ref >60)
Glucose, Bld: 98 mg/dL (ref 70–99)
Potassium: 2.4 mmol/L — CL (ref 3.5–5.1)
Sodium: 127 mmol/L — ABNORMAL LOW (ref 135–145)
Total Bilirubin: 0.5 mg/dL (ref 0.0–1.2)
Total Protein: 6.4 g/dL — ABNORMAL LOW (ref 6.5–8.1)

## 2023-09-30 LAB — BLOOD GAS, ARTERIAL
Acid-base deficit: 1.8 mmol/L (ref 0.0–2.0)
Bicarbonate: 21.9 mmol/L (ref 20.0–28.0)
O2 Saturation: 96.2 %
Patient temperature: 37
pCO2 arterial: 33 mmHg (ref 32–48)
pH, Arterial: 7.43 (ref 7.35–7.45)
pO2, Arterial: 71 mmHg — ABNORMAL LOW (ref 83–108)

## 2023-09-30 LAB — URINALYSIS, ROUTINE W REFLEX MICROSCOPIC
Bilirubin Urine: NEGATIVE
Glucose, UA: NEGATIVE mg/dL
Ketones, ur: NEGATIVE mg/dL
Nitrite: NEGATIVE
Protein, ur: 30 mg/dL — AB
Specific Gravity, Urine: 1.006 (ref 1.005–1.030)
WBC, UA: >50 WBC/hpf (ref 0–5)
pH: 6 (ref 5.0–8.0)

## 2023-09-30 LAB — CBC WITH DIFFERENTIAL/PLATELET
Abs Immature Granulocytes: 0.12 10*3/uL — ABNORMAL HIGH (ref 0.00–0.07)
Basophils Absolute: 0 10*3/uL (ref 0.0–0.1)
Basophils Relative: 0 %
Eosinophils Absolute: 0 10*3/uL (ref 0.0–0.5)
Eosinophils Relative: 0 %
HCT: 33.8 % — ABNORMAL LOW (ref 39.0–52.0)
Hemoglobin: 10.8 g/dL — ABNORMAL LOW (ref 13.0–17.0)
Immature Granulocytes: 1 %
Lymphocytes Relative: 3 %
Lymphs Abs: 0.4 10*3/uL — ABNORMAL LOW (ref 0.7–4.0)
MCH: 25.1 pg — ABNORMAL LOW (ref 26.0–34.0)
MCHC: 32 g/dL (ref 30.0–36.0)
MCV: 78.6 fL — ABNORMAL LOW (ref 80.0–100.0)
Monocytes Absolute: 1.1 10*3/uL — ABNORMAL HIGH (ref 0.1–1.0)
Monocytes Relative: 7 %
Neutro Abs: 13.4 10*3/uL — ABNORMAL HIGH (ref 1.7–7.7)
Neutrophils Relative %: 89 %
Platelets: 281 10*3/uL (ref 150–400)
RBC: 4.3 MIL/uL (ref 4.22–5.81)
RDW: 15.4 % (ref 11.5–15.5)
WBC: 15 10*3/uL — ABNORMAL HIGH (ref 4.0–10.5)
nRBC: 0 % (ref 0.0–0.2)

## 2023-09-30 LAB — CBG MONITORING, ED
Glucose-Capillary: 58 mg/dL — ABNORMAL LOW (ref 70–99)
Glucose-Capillary: 77 mg/dL (ref 70–99)
Glucose-Capillary: 112 mg/dL — ABNORMAL HIGH (ref 70–99)
Glucose-Capillary: 21 mg/dL — CL (ref 70–99)
Glucose-Capillary: 38 mg/dL — CL (ref 70–99)
Glucose-Capillary: 135 mg/dL — ABNORMAL HIGH (ref 70–99)
Glucose-Capillary: 39 mg/dL — CL (ref 70–99)
Glucose-Capillary: 94 mg/dL (ref 70–99)
Glucose-Capillary: 90 mg/dL (ref 70–99)
Glucose-Capillary: 150 mg/dL — ABNORMAL HIGH (ref 70–99)

## 2023-09-30 LAB — ETHANOL: Alcohol, Ethyl (B): <10 mg/dL (ref <10)

## 2023-09-30 LAB — RESP PANEL BY RT-PCR (RSV, FLU A&B, COVID)  RVPGX2
Influenza A by PCR: NEGATIVE
Influenza B by PCR: NEGATIVE
Resp Syncytial Virus by PCR: NEGATIVE
SARS Coronavirus 2 by RT PCR: NEGATIVE

## 2023-09-30 LAB — MAGNESIUM: Magnesium: 2.1 mg/dL (ref 1.7–2.4)

## 2023-09-30 LAB — TSH: TSH: 0.852 u[IU]/mL (ref 0.350–4.500)

## 2023-09-30 LAB — LACTIC ACID, PLASMA
Lactic Acid, Venous: 0.8 mmol/L (ref 0.5–1.9)
Lactic Acid, Venous: 1.1 mmol/L (ref 0.5–1.9)

## 2023-09-30 LAB — CORTISOL: Cortisol, Plasma: 20.3 ug/dL

## 2023-09-30 LAB — AMMONIA: Ammonia: 10 umol/L (ref 9–35)

## 2023-09-30 MED ORDER — ENOXAPARIN SODIUM 40 MG/0.4ML IJ SOSY: 40.0000 mg | PREFILLED_SYRINGE | INTRAMUSCULAR | Status: DC

## 2023-09-30 MED ORDER — DEXTROSE 50 % IV SOLN
1.0000 | Freq: Once | INTRAVENOUS | Status: AC
Start: 2023-09-30 — End: 2023-09-30
  Administered 2023-09-30: 50 mL via INTRAVENOUS
  Filled 2023-09-30: qty 50

## 2023-09-30 MED ORDER — ONDANSETRON HCL 4 MG/2ML IJ SOLN: 4.0000 mg | Freq: Four times a day (QID) | INTRAMUSCULAR | Status: DC | PRN

## 2023-09-30 MED ORDER — SODIUM CHLORIDE 0.9 % IV SOLN
2.0000 g | Freq: Once | INTRAVENOUS | Status: AC
  Administered 2023-09-30: 2 g via INTRAVENOUS
  Filled 2023-09-30: qty 20

## 2023-09-30 MED ORDER — ASPIRIN 81 MG PO TBEC
81.0000 mg | DELAYED_RELEASE_TABLET | Freq: Every day | ORAL | Status: DC
  Administered 2023-09-30 – 2023-10-02 (×3): 81 mg via ORAL
  Filled 2023-09-30 (×3): qty 1

## 2023-09-30 MED ORDER — ACETAMINOPHEN 650 MG RE SUPP: 650.0000 mg | Freq: Four times a day (QID) | RECTAL | Status: DC | PRN

## 2023-09-30 MED ORDER — ONDANSETRON HCL 4 MG PO TABS: 4.0000 mg | ORAL_TABLET | Freq: Four times a day (QID) | ORAL | Status: DC | PRN

## 2023-09-30 MED ORDER — ACETAMINOPHEN 325 MG PO TABS: 650.0000 mg | ORAL_TABLET | Freq: Four times a day (QID) | ORAL | Status: DC | PRN

## 2023-09-30 MED ORDER — CHLORHEXIDINE GLUCONATE CLOTH 2 % EX PADS
6.0000 | MEDICATED_PAD | Freq: Every day | CUTANEOUS | Status: DC
  Administered 2023-09-30 – 2023-10-01 (×2): 6 via TOPICAL

## 2023-09-30 MED ORDER — TEMAZEPAM 15 MG PO CAPS
30.0000 mg | ORAL_CAPSULE | Freq: Every day | ORAL | Status: DC
  Administered 2023-09-30 – 2023-10-01 (×2): 30 mg via ORAL
  Filled 2023-09-30 (×2): qty 2

## 2023-09-30 MED ORDER — DEXTROSE 50 % IV SOLN
1.0000 | Freq: Once | INTRAVENOUS | Status: AC
  Administered 2023-09-30: 50 mL via INTRAVENOUS
  Filled 2023-09-30: qty 50

## 2023-09-30 MED ORDER — SENNA 8.6 MG PO TABS: 2.0000 | ORAL_TABLET | Freq: Two times a day (BID) | ORAL | Status: DC | PRN

## 2023-09-30 MED ORDER — TAMSULOSIN HCL 0.4 MG PO CAPS
0.4000 mg | ORAL_CAPSULE | Freq: Two times a day (BID) | ORAL | Status: DC
Start: 2023-09-30 — End: 2023-10-02
  Administered 2023-09-30 – 2023-10-02 (×5): 0.4 mg via ORAL
  Filled 2023-09-30 (×5): qty 1

## 2023-09-30 MED ORDER — SODIUM CHLORIDE 0.9 % IV BOLUS (SEPSIS)
2000.0000 mL | Freq: Once | INTRAVENOUS | Status: AC
  Administered 2023-09-30: 2000 mL via INTRAVENOUS

## 2023-09-30 MED ORDER — SODIUM CHLORIDE 0.9 % IV BOLUS
1000.0000 mL | Freq: Once | INTRAVENOUS | Status: AC
  Administered 2023-09-30: 1000 mL via INTRAVENOUS

## 2023-09-30 MED ORDER — ALBUTEROL SULFATE (2.5 MG/3ML) 0.083% IN NEBU: 2.5000 mg | INHALATION_SOLUTION | RESPIRATORY_TRACT | Status: DC | PRN

## 2023-09-30 MED ORDER — HYDROCORTISONE SOD SUC (PF) 100 MG IJ SOLR
100.0000 mg | Freq: Once | INTRAMUSCULAR | Status: AC
  Administered 2023-09-30: 100 mg via INTRAVENOUS
  Filled 2023-09-30: qty 2

## 2023-09-30 MED ORDER — LACTATED RINGERS IV SOLN: INTRAVENOUS | Status: AC

## 2023-09-30 MED ORDER — ORAL CARE MOUTH RINSE: 15.0000 mL | OROMUCOSAL | Status: DC | PRN

## 2023-09-30 MED ORDER — POTASSIUM CHLORIDE CRYS ER 20 MEQ PO TBCR
40.0000 meq | EXTENDED_RELEASE_TABLET | Freq: Once | ORAL | Status: AC
  Administered 2023-09-30: 40 meq via ORAL
  Filled 2023-09-30: qty 2

## 2023-09-30 MED ORDER — SODIUM CHLORIDE 0.9 % IV SOLN
1.0000 g | INTRAVENOUS | Status: DC
  Administered 2023-10-01 – 2023-10-02 (×2): 1 g via INTRAVENOUS
  Filled 2023-09-30 (×3): qty 10

## 2023-09-30 MED ORDER — MAGNESIUM OXIDE -MG SUPPLEMENT 400 (240 MG) MG PO TABS
800.0000 mg | ORAL_TABLET | Freq: Once | ORAL | Status: AC
  Administered 2023-09-30: 800 mg via ORAL
  Filled 2023-09-30: qty 2

## 2023-09-30 MED ORDER — CARIPRAZINE HCL 1.5 MG PO CAPS
6.0000 mg | ORAL_CAPSULE | Freq: Every morning | ORAL | Status: DC
  Administered 2023-09-30 – 2023-10-02 (×3): 6 mg via ORAL
  Filled 2023-09-30 (×3): qty 4

## 2023-09-30 MED ORDER — INSULIN ASPART 100 UNIT/ML IJ SOLN
0.0000 [IU] | Freq: Three times a day (TID) | INTRAMUSCULAR | Status: DC
  Administered 2023-10-01: 3 [IU] via SUBCUTANEOUS
  Administered 2023-10-01: 2 [IU] via SUBCUTANEOUS
  Administered 2023-10-01: 3 [IU] via SUBCUTANEOUS
  Administered 2023-10-02 (×2): 2 [IU] via SUBCUTANEOUS
  Filled 2023-09-30: qty 0.15

## 2023-09-30 MED ORDER — BETHANECHOL CHLORIDE 10 MG PO TABS
5.0000 mg | ORAL_TABLET | Freq: Two times a day (BID) | ORAL | Status: DC
  Administered 2023-09-30 – 2023-10-02 (×5): 5 mg via ORAL
  Filled 2023-09-30: qty 1
  Filled 2023-09-30 (×4): qty 0.5

## 2023-09-30 MED ORDER — TRAZODONE HCL 50 MG PO TABS: 50.0000 mg | ORAL_TABLET | Freq: Every evening | ORAL | Status: DC | PRN

## 2023-09-30 MED ORDER — SENNOSIDES 15 MG PO CHEW: 15.0000 mg | CHEWABLE_TABLET | Freq: Four times a day (QID) | ORAL | Status: DC | PRN

## 2023-09-30 MED ORDER — POTASSIUM CHLORIDE 10 MEQ/100ML IV SOLN
10.0000 meq | INTRAVENOUS | Status: AC
Start: 2023-09-30 — End: 2023-09-30
  Administered 2023-09-30 (×6): 10 meq via INTRAVENOUS
  Filled 2023-09-30 (×6): qty 100

## 2023-09-30 MED ORDER — DEXTROSE 50 % IV SOLN
1.0000 | Freq: Once | INTRAVENOUS | Status: AC
  Administered 2023-09-30: 50 mL via INTRAVENOUS

## 2023-09-30 MED ORDER — HEPARIN SODIUM (PORCINE) 5000 UNIT/ML IJ SOLN
5000.0000 [IU] | Freq: Three times a day (TID) | INTRAMUSCULAR | Status: DC
  Administered 2023-09-30 – 2023-10-02 (×6): 5000 [IU] via SUBCUTANEOUS
  Filled 2023-09-30 (×6): qty 1

## 2023-09-30 MED ORDER — DEXTROSE 10 % IV SOLN: INTRAVENOUS | Status: DC

## 2023-09-30 NOTE — ED Triage Notes (Signed)
 Patient complaints of dizziness and hypoglycemia over the last week. Wife reporting NVD and confusion, reports he has not been taking his schizophrenia medication.  Catheter in place, EMS found leg bag bulging and abdomen distended.  CBG 54 on arrival, 1 dose of glucagon given en route.

## 2023-09-30 NOTE — ED Provider Notes (Signed)
  Physical Exam  BP (!) 95/55   Pulse 65   Temp 98.8 F (37.1 C) (Oral)   Resp 18   SpO2 97%   Physical Exam  Procedures  .Critical Care  Performed by: Alvira Monday, MD Authorized by: Alvira Monday, MD   Critical care provider statement:    Critical care time (minutes):  30   Critical care was time spent personally by me on the following activities:  Development of treatment plan with patient or surrogate, evaluation of patient's response to treatment, examination of patient, ordering and review of laboratory studies, ordering and review of radiographic studies, ordering and performing treatments and interventions, pulse oximetry, re-evaluation of patient's condition and review of old charts   ED Course / MDM     Received care of patient from Dr. Adela Lank. Please see his history and exam for further care.  UA

## 2023-09-30 NOTE — ED Notes (Signed)
 Patient resting in bed breathing eyes closed

## 2023-09-30 NOTE — Sepsis Progress Note (Signed)
 Elink monitoring for the code sepsis protocol.

## 2023-09-30 NOTE — ED Provider Notes (Signed)
 [facility] [facility] Provider Note   CSN: [id number] Arrival date & time: 09/30/23  0518     History  Chief Complaint  Patient presents with   Altered Mental Status   Hypoglycemia    Edwin [name] is a 68 y.o. male.  68 yo M with a chief complaints of altered mental status and hypoglycemia.  Patient is not sure what exactly happened to bring him here this evening.  It sounds like maybe he had suffered from a fall and then EMS was called.  Found to be hypoglycemic.  Hypotensive reportedly on scene as well.  Patient seems a bit confused on exam and has trouble providing any further history.        Home Medications Prior to Admission medications   Medication Sig Start Date End Date Taking? Authorizing Provider  acetaminophen (TYLENOL) 325 MG tablet Take 650 mg by mouth every 6 (six) hours as needed for moderate pain.    [provider]  aspirin EC 81 MG tablet Take 81 mg by mouth daily.    [provider]  bethanechol (URECHOLINE) 5 MG tablet TAKE 1 TABLET (5 MG TOTAL) BY MOUTH IN THE MORNING AND AT BEDTIME 06/04/23   McKenzie, Mardene Celeste, MD  CAPLYTA 42 MG capsule Take 42 mg by mouth every morning. 08/20/23   [provider]  Continuous Glucose Receiver (FREESTYLE LIBRE 3 READER) DEVI Use to check blood sugar 3 (three) times daily before meals. 06/13/23   Shalhoub, Deno Lunger, MD  Continuous Glucose Sensor (FREESTYLE LIBRE 3 SENSOR) MISC Place 1 sensor on the skin every 14 days. Use to check glucose continuously 3 times a day before meals 06/13/23   Shalhoub, Deno Lunger, MD  EX-LAX 15 MG CHEW Chew 15 mg by mouth every 6 (six) hours as needed (for constipation).    [provider]  finasteride (PROSCAR) 5 MG tablet TAKE 1 TABLET BY MOUTH EVERY DAY Patient not taking: Reported on 09/24/2023 05/17/20   Malen Gauze, MD  furosemide (LASIX) 20 MG tablet Take 20 mg by mouth daily as needed for fluid or edema.  05/11/22   [provider]  Ipratropium-Albuterol (COMBIVENT RESPIMAT) 20-100 MCG/ACT AERS respimat INHALE 1 PUFF INTO THE LUNGS EVERY 6 HOURS AS NEEDED FOR WHEEZING OR SHORTNESS OF BREATH. Patient taking differently: Inhale 1 puff into the lungs every 6 (six) hours as needed for wheezing or shortness of breath. 06/04/22   Coralyn Helling, MD  Multiple Vitamin (MULTIVITAMIN) tablet Take 1 tablet by mouth daily.    [provider]  tamsulosin (FLOMAX) 0.4 MG CAPS capsule TAKE 1 CAPSULE BY MOUTH TWICE A DAY 06/18/23   McKenzie, Mardene Celeste, MD  Tart Cherry 500 MG CAPS Take 500 mg by mouth daily. Patient not taking: Reported on 09/24/2023    [provider]  temazepam (RESTORIL) 30 MG capsule Take 30 mg by mouth at bedtime. 09/21/23   [provider]  Vitamin D, Ergocalciferol, (DRISDOL) 1.25 MG (50000 UNIT) CAPS capsule Take 50,000 Units by mouth every 7 (seven) days.    [provider]  VRAYLAR 6 MG CAPS Take 6 mg by mouth every morning. 10/24/21   [provider]      Allergies    Patient has no known allergies.    Review of Systems   Review of Systems  Physical Exam Updated Vital Signs BP (!) 92/55   Pulse 73   Temp 98.8 F (37.1 C) (Oral)  Resp 18   SpO2 99%  Physical Exam Vitals and nursing note reviewed.  Constitutional:      Appearance: He is well-developed.  HENT:     Head: Normocephalic and atraumatic.  Eyes:     Pupils: Pupils are equal, round, and reactive to light.  Neck:     Vascular: No JVD.  Cardiovascular:     Rate and Rhythm: Normal rate and regular rhythm.     Heart sounds: No murmur heard.    No friction rub. No gallop.  Pulmonary:     Effort: No respiratory distress.     Breath sounds: No wheezing.  Abdominal:     General: There is distension.     Tenderness: There is no abdominal tenderness. There is no guarding or rebound.     Comments: Distended abdomen, tympanitic to percussion  Musculoskeletal:         General: Normal range of motion.     Cervical back: Normal range of motion and neck supple.  Skin:    Coloration: Skin is not pale.     Findings: No rash.  Neurological:     Mental Status: He is alert and oriented to person, place, and time.  Psychiatric:        Behavior: Behavior normal.     ED Results / Procedures / Treatments   Labs (all labs ordered are listed, but only abnormal results are displayed) Labs Reviewed  COMPREHENSIVE METABOLIC PANEL - Abnormal; Notable for the following components:      Result Value   Sodium 127 (*)    Potassium 2.4 (*)    Chloride 96 (*)    CO2 20 (*)    BUN 30 (*)    Creatinine, Ser 2.08 (*)    Calcium 8.0 (*)    Total Protein 6.4 (*)    Albumin 2.7 (*)    GFR, Estimated 35 (*)    All other components within normal limits  CBC WITH DIFFERENTIAL/PLATELET - Abnormal; Notable for the following components:   WBC 15.0 (*)    Hemoglobin 10.8 (*)    HCT 33.8 (*)    MCV 78.6 (*)    MCH 25.1 (*)    Neutro Abs 13.4 (*)    Lymphs Abs 0.4 (*)    Monocytes Absolute 1.1 (*)    Abs Immature Granulocytes 0.12 (*)    All other components within normal limits  URINALYSIS, ROUTINE W REFLEX MICROSCOPIC - Abnormal; Notable for the following components:   APPearance CLOUDY (*)    Hgb urine dipstick MODERATE (*)    Protein, ur 30 (*)    Leukocytes,Ua LARGE (*)    Bacteria, UA MANY (*)    All other components within normal limits  BLOOD GAS, ARTERIAL - Abnormal; Notable for the following components:   pO2, Arterial 71 (*)    All other components within normal limits  CBG MONITORING, ED - Abnormal; Notable for the following components:   Glucose-Capillary 58 (*)    All other components within normal limits  RESP PANEL BY RT-PCR (RSV, FLU A&B, COVID)  RVPGX2  CULTURE, BLOOD (ROUTINE X 2)  CULTURE, BLOOD (ROUTINE X 2)  AMMONIA  LACTIC ACID, PLASMA  ETHANOL  TSH  LACTIC ACID, PLASMA  CORTISOL  MAGNESIUM  CBG MONITORING, ED    EKG EKG  Interpretation Date/Time:  Monday September 30 2023 05:36:13 EST Ventricular Rate:  84 PR Interval:    QRS Duration:  112 QT Interval:  375 QTC Calculation: 446 R Axis:  3  Text Interpretation: Accelerated junctional rhythm Borderline intraventricular conduction delay No significant change since last tracing Confirmed by Melene Plan [id number]) on 09/30/2023 5:42:53 AM  Radiology CT HEAD WO CONTRAST Result Date: 09/30/2023 CLINICAL DATA:  Mental status change, persistent or worsening. EXAM: CT HEAD WITHOUT CONTRAST TECHNIQUE: Contiguous axial images were obtained from the base of the skull through the vertex without intravenous contrast. RADIATION DOSE REDUCTION: This exam was performed according to the departmental dose-optimization program which includes automated exposure control, adjustment of the mA and/or kV according to patient size and/or use of iterative reconstruction technique. COMPARISON:  09/24/2023 FINDINGS: Brain: No evidence of acute infarction, hemorrhage, hydrocephalus, extra-axial collection or mass lesion/mass effect. Vascular: No hyperdense vessel or unexpected calcification. Skull: Normal. Negative for fracture or focal lesion. Sinuses/Orbits: No acute finding. IMPRESSION: No acute or interval finding. Electronically Signed   By: Tiburcio Pea M.D.   On: 09/30/2023 06:44   DG Chest Port 1 View Result Date: 09/30/2023 CLINICAL DATA:  Altered mental status. EXAM: PORTABLE CHEST 1 VIEW COMPARISON:  08/25/2023 FINDINGS: Low volume film. The cardio pericardial silhouette is enlarged. There is pulmonary vascular congestion without overt pulmonary edema. No overt airspace pulmonary edema. No pleural effusion. No acute bony abnormality. Telemetry leads overlie the chest. IMPRESSION: Low lung volumes with vascular congestion. Electronically Signed   By: Kennith Center M.D.   On: 09/30/2023 05:45    Procedures .Critical Care  Performed by: Melene Plan, DO Authorized by: Melene Plan, DO    Critical care provider statement:    Critical care time (minutes):  35   Critical care time was exclusive of:  Separately billable procedures and treating other patients   Critical care was time spent personally by me on the following activities:  Development of treatment plan with patient or surrogate, discussions with consultants, evaluation of patient's response to treatment, examination of patient, ordering and review of laboratory studies, ordering and review of radiographic studies, ordering and performing treatments and interventions, pulse oximetry, re-evaluation of patient's condition and review of old charts   Care discussed with: admitting provider      Procedure note: Ultrasound Guided Peripheral IV Ultrasound guided peripheral 1.88 inch angiocath IV placement performed by me. Indications: Nursing unable to place IV. Details: The antecubital fossa and upper arm were evaluated with a multifrequency linear probe. Patent brachial veins were noted. 1 attempt was made to cannulate a vein under realtime US guidance with successful cannulation of the vein and catheter placement. There is return of non-pulsatile dark red blood. The patient tolerated the procedure well without complications. Images archived electronically.  CPT codes: 32440 and [phone]    Medications Ordered in ED Medications  dextrose 10 % infusion ( Intravenous New Bag/Given 09/30/23 0556)  cefTRIAXone (ROCEPHIN) 2 g in sodium chloride 0.9 % 100 mL IVPB (2 g Intravenous New Bag/Given 09/30/23 0648)  lactated ringers infusion (has no administration in time range)  sodium chloride 0.9 % bolus 2,000 mL (2,000 mLs Intravenous New Bag/Given 09/30/23 0649)  potassium chloride 10 mEq in 100 mL IVPB (has no administration in time range)  potassium chloride SA (KLOR-CON M) CR tablet 40 mEq (has no administration in time range)  magnesium oxide (MAG-OX) tablet 800 mg (has no administration in time range)  sodium chloride 0.9 % bolus  1,000 mL (1,000 mLs Intravenous New Bag/Given 09/30/23 0608)  dextrose 50 % solution 50 mL (50 mLs Intravenous Given 09/30/23 0609)    ED Course/ Medical Decision Making/ A&P  Medical Decision Making Amount and/or Complexity of Data Reviewed Labs: ordered. Radiology: ordered.  Risk OTC drugs. Prescription drug management.   68 yo M with a chief complaint of altered mental status.  The patient was recently hospitalized for hypoglycemia.  He was reportedly hypoglycemic on scene today as well.  Patient is unable to provide much history but per EMS report the patient has been sick for the past week not really eating or drinking not taking his medications.  Patient hypotensive on arrival here.  Chest x-ray independently interpreted by me without focal infiltrate or pneumothorax.  UA is concerning for a urinary tract infection.  With hypotension and positive UA will activate code sepsis.  AKI, hypokalemia.   Patient getting fluid resuscitation.  Hopefully blood pressures will improve and go to medicine.  The patients results and plan were reviewed and discussed.   Any x-rays performed were independently reviewed by myself.   Differential diagnosis were considered with the presenting HPI.  Medications  dextrose 10 % infusion ( Intravenous New Bag/Given 09/30/23 0556)  cefTRIAXone (ROCEPHIN) 2 g in sodium chloride 0.9 % 100 mL IVPB (2 g Intravenous New Bag/Given 09/30/23 0648)  lactated ringers infusion (has no administration in time range)  sodium chloride 0.9 % bolus 2,000 mL (2,000 mLs Intravenous New Bag/Given 09/30/23 0649)  potassium chloride 10 mEq in 100 mL IVPB (has no administration in time range)  potassium chloride SA (KLOR-CON M) CR tablet 40 mEq (has no administration in time range)  magnesium oxide (MAG-OX) tablet 800 mg (has no administration in time range)  sodium chloride 0.9 % bolus 1,000 mL (1,000 mLs Intravenous New Bag/Given 09/30/23 0608)   dextrose 50 % solution 50 mL (50 mLs Intravenous Given 09/30/23 0609)    Vitals:   09/30/23 0535 09/30/23 0600 09/30/23 0615 09/30/23 0645  BP:   (!) 97/45 (!) 92/55  Pulse:  80 79 73  Resp:  17 18   Temp:  98.8 F (37.1 C)    TempSrc:  Oral    SpO2: 100% 95% 96% 99%    Final diagnoses:  Hypoglycemia  Sepsis, due to unspecified organism, unspecified whether acute organ dysfunction present [facility])    Admission/ observation were discussed with the admitting physician, patient and/or family and they are comfortable with the plan.          Final Clinical Impression(s) / ED Diagnoses Final diagnoses:  Hypoglycemia  Sepsis, due to unspecified organism, unspecified whether acute organ dysfunction present [facility])    Rx / DC Orders ED Discharge Orders     None         Melene Plan, DO 09/30/23 0715

## 2023-09-30 NOTE — ED Notes (Signed)
 ED TO INPATIENT HANDOFF REPORT  Name/Age/Gender Edwin [name] 68 y.o. male  Code Status    Code Status Orders  (From admission, onward)           Start     Ordered   09/30/23 1120  Full code  Continuous       Question:  By:  Answer:  Consent: discussion documented in EHR   09/30/23 1121           Code Status History     Date Active Date Inactive Code Status Order ID Comments User Context   09/24/2023 2322 09/26/2023 2320 Full Code [id number]  Nolberto Hanlon, MD ED   06/11/2023 2008 06/13/2023 2244 Full Code [id number]  Steffanie Rainwater, MD ED   07/06/2022 1601 07/09/2022 1813 Full Code [id number]  Jonah Blue, MD Inpatient   11/08/2021 0853 11/16/2021 2158 Full Code [id number]  Margie Ege A, DO ED   11/08/2021 0404 11/08/2021 0853 Full Code [id number]  Howerter, Chaney Born, DO ED   07/09/2016 1539 07/10/2016 1737 Full Code [id number]  Malen Gauze, MD Inpatient   06/27/2015 1621 06/28/2015 1555 Full Code [id number]  Malen Gauze, MD Inpatient   06/10/2015 1437 06/17/2015 2058 Full Code [id number]  Malen Gauze, MD Inpatient   04/14/2015 1859 04/18/2015 1702 Full Code [id number]  Alba Cory, MD Inpatient   03/31/2015 1754 04/07/2015 1855 Full Code [id number]  Alba Cory, MD Inpatient   01/13/2015 2014 01/20/2015 2238 Full Code [id number]  Hillary Bow, DO Inpatient       Home/SNF/Other Home  Chief Complaint Severe sepsis (HCC) [A41.9, R65.20]  Level of Care/Admitting Diagnosis ED Disposition     ED Disposition  Admit   Condition  --   Comment  Hospital Area: [facility] [[id number]]  Level of Care: Stepdown [14]  Admit to SDU based on following criteria: Severe physiological/psychological symptoms:  Any diagnosis requiring assessment & intervention at least every 4 hours on an ongoing basis to obtain desired patient outcomes including stability and rehabilitation  May admit patient to [facility] or [facility] if  equivalent level of care is available:: Yes  Covid Evaluation: Asymptomatic - no recent exposure (last 10 days) testing not required  Diagnosis: Severe sepsis [facility]) [[id number]]  Admitting Physician: Maryln Gottron [[id number]]  Attending Physician: Olexa.Dam, MIR Jaxson.Roy [[id number]]  Certification:: I certify this patient will need inpatient services for at least 2 midnights  Expected Medical Readiness: 10/03/2023          Medical History Past Medical History:  Diagnosis Date   Anxiety    Cancer [facility])    prostate   CKD (chronic kidney disease) stage 3, GFR 30-59 ml/min (HCC)    Depression    GERD (gastroesophageal reflux disease)    under control   History of Clostridium difficile colitis    03-31-2015--  resolved   History of sepsis    secondary to UTI and ARF  x2 admission 03-31-2015 &  04-14-2015   Hyperlipidemia    Paranoid schizophrenia (HCC)    Urinary incontinence     Allergies No Known Allergies  IV Location/Drains/Wounds Patient Lines/Drains/Airways Status     Active Line/Drains/Airways     Name Placement date Placement time Site Days   Peripheral IV 09/30/23 22 G Anterior;Left Forearm 09/30/23  0555  Forearm  less than 1   Peripheral IV 09/30/23 20 G Posterior;Right Hand 09/30/23  1530  Hand  less than 1   Urethral Catheter  Rosalita Chessman PA Coude 05/26/23  2033  Coude  127            Labs/Imaging Results for orders placed or performed during the hospital encounter of 09/30/23 (from the past 48 hours)  CBG monitoring, ED     Status: Abnormal   Collection Time: 09/30/23  5:25 AM  Result Value Ref Range   Glucose-Capillary 58 (L) 70 - 99 mg/dL    Comment: Glucose reference range applies only to samples taken after fasting for at least 8 hours.  Ammonia     Status: None   Collection Time: 09/30/23  5:29 AM  Result Value Ref Range   Ammonia 10 9 - 35 umol/L    Comment: Performed at [facility], [address] [place], [place] [address]   Comprehensive metabolic panel     Status: Abnormal   Collection Time: 09/30/23  5:29 AM  Result Value Ref Range   Sodium 127 (L) 135 - 145 mmol/L   Potassium 2.4 (LL) 3.5 - 5.1 mmol/L    Comment: CRITICAL RESULT CALLED TO, READ BACK BY AND VERIFIED WITH HALL,K. RN AT 1027 09/30/23 MULLINS,T    Chloride 96 (L) 98 - 111 mmol/L   CO2 20 (L) 22 - 32 mmol/L   Glucose, Bld 98 70 - 99 mg/dL    Comment: Glucose reference range applies only to samples taken after fasting for at least 8 hours.   BUN 30 (H) 8 - 23 mg/dL   Creatinine, Ser 2.53 (H) 0.61 - 1.24 mg/dL   Calcium 8.0 (L) 8.9 - 10.3 mg/dL   Total Protein 6.4 (L) 6.5 - 8.1 g/dL   Albumin 2.7 (L) 3.5 - 5.0 g/dL   AST 17 15 - 41 U/L   ALT 17 0 - 44 U/L   Alkaline Phosphatase 57 38 - 126 U/L   Total Bilirubin 0.5 0.0 - 1.2 mg/dL   GFR, Estimated 35 (L) >60 mL/min    Comment: (NOTE) Calculated using the CKD-EPI Creatinine Equation (2021)    Anion gap 11 5 - 15    Comment: Performed at [facility], [address], [place], [place] [address]  CBC WITH DIFFERENTIAL     Status: Abnormal   Collection Time: 09/30/23  5:29 AM  Result Value Ref Range   WBC 15.0 (H) 4.0 - 10.5 K/uL   RBC 4.30 4.22 - 5.81 MIL/uL   Hemoglobin 10.8 (L) 13.0 - 17.0 g/dL   HCT 34.7 (L) 42.5 - 95.6 %   MCV 78.6 (L) 80.0 - 100.0 fL   MCH 25.1 (L) 26.0 - 34.0 pg   MCHC 32.0 30.0 - 36.0 g/dL   RDW 38.7 56.4 - 33.2 %   Platelets 281 150 - 400 K/uL   nRBC 0.0 0.0 - 0.2 %   Neutrophils Relative % 89 %   Neutro Abs 13.4 (H) 1.7 - 7.7 K/uL   Lymphocytes Relative 3 %   Lymphs Abs 0.4 (L) 0.7 - 4.0 K/uL   Monocytes Relative 7 %   Monocytes Absolute 1.1 (H) 0.1 - 1.0 K/uL   Eosinophils Relative 0 %   Eosinophils Absolute 0.0 0.0 - 0.5 K/uL   Basophils Relative 0 %   Basophils Absolute 0.0 0.0 - 0.1 K/uL   Immature Granulocytes 1 %   Abs Immature Granulocytes 0.12 (H) 0.00 - 0.07 K/uL    Comment: Performed at [facility],  [address], [place], [place] [address]  Lactic acid, plasma  Status: None   Collection Time: 09/30/23  5:29 AM  Result Value Ref Range   Lactic Acid, Venous 0.8 0.5 - 1.9 mmol/L    Comment: Performed at [facility], [address], [place], [place] [address]  Ethanol     Status: None   Collection Time: 09/30/23  5:29 AM  Result Value Ref Range   Alcohol, Ethyl (B) <10 <10 mg/dL    Comment: (NOTE) Lowest detectable limit for serum alcohol is 10 mg/dL.  For medical purposes only. Performed at [facility], [address] [place], [place], [place] [address]   Magnesium     Status: None   Collection Time: 09/30/23  5:29 AM  Result Value Ref Range   Magnesium 2.1 1.7 - 2.4 mg/dL    Comment: Performed at [facility], [address] [place], [place] [address]  Urinalysis, Routine w reflex microscopic -Urine, Clean Catch     Status: Abnormal   Collection Time: 09/30/23  5:30 AM  Result Value Ref Range   Color, Urine YELLOW YELLOW   APPearance CLOUDY (A) CLEAR   Specific Gravity, Urine 1.006 1.005 - 1.030   pH 6.0 5.0 - 8.0   Glucose, UA NEGATIVE NEGATIVE mg/dL   Hgb urine dipstick MODERATE (A) NEGATIVE   Bilirubin Urine NEGATIVE NEGATIVE   Ketones, ur NEGATIVE NEGATIVE mg/dL   Protein, ur 30 (A) NEGATIVE mg/dL   Nitrite NEGATIVE NEGATIVE   Leukocytes,Ua LARGE (A) NEGATIVE   RBC / HPF 6-10 0 - 5 RBC/hpf   WBC, UA >50 0 - 5 WBC/hpf   Bacteria, UA MANY (A) NONE SEEN   Squamous Epithelial / HPF 0-5 0 - 5 /HPF   WBC Clumps PRESENT    Mucus PRESENT     Comment: Performed at [facility], [address] [place], [place] [address]  Resp panel by RT-PCR (RSV, Flu A&B, Covid) Anterior Nasal Swab     Status: None   Collection Time: 09/30/23  5:30 AM   Specimen: Anterior Nasal Swab  Result Value Ref Range   SARS Coronavirus 2 by RT PCR NEGATIVE NEGATIVE    Comment: (NOTE) SARS-CoV-2 target nucleic acids are  NOT DETECTED.  The SARS-CoV-2 RNA is generally detectable in upper respiratory specimens during the acute phase of infection. The lowest concentration of SARS-CoV-2 viral copies this assay can detect is 138 copies/mL. A negative result does not preclude SARS-Cov-2 infection and should not be used as the sole basis for treatment or other patient management decisions. A negative result may occur with  improper specimen collection/handling, submission of specimen other than nasopharyngeal swab, presence of viral mutation(s) within the areas targeted by this assay, and inadequate number of viral copies(<138 copies/mL). A negative result must be combined with clinical observations, patient history, and epidemiological information. The expected result is Negative.  Fact Sheet for Patients:  BloggerCourse.com  Fact Sheet for Healthcare Providers:  SeriousBroker.it  This test is no t yet approved or cleared by the [place] FDA and  has been authorized for detection and/or diagnosis of SARS-CoV-2 by FDA under an Emergency Use Authorization (EUA). This EUA will remain  in effect (meaning this test can be used) for the duration of the COVID-19 declaration under Section 564(b)(1) of the Act, 21 U.S.C.section 360bbb-3(b)(1), unless the authorization is terminated  or revoked sooner.       Influenza A by PCR NEGATIVE NEGATIVE   Influenza B by PCR NEGATIVE NEGATIVE    Comment: (NOTE)  The Xpert Xpress SARS-CoV-2/FLU/RSV plus assay is intended as an aid in the diagnosis of influenza from Nasopharyngeal swab specimens and should not be used as a sole basis for treatment. Nasal washings and aspirates are unacceptable for Xpert Xpress SARS-CoV-2/FLU/RSV testing.  Fact Sheet for Patients: BloggerCourse.com  Fact Sheet for Healthcare Providers: SeriousBroker.it  This test is not yet approved  or cleared by the [place] FDA and has been authorized for detection and/or diagnosis of SARS-CoV-2 by FDA under an Emergency Use Authorization (EUA). This EUA will remain in effect (meaning this test can be used) for the duration of the COVID-19 declaration under Section 564(b)(1) of the Act, 21 U.S.C. section 360bbb-3(b)(1), unless the authorization is terminated or revoked.     Resp Syncytial Virus by PCR NEGATIVE NEGATIVE    Comment: (NOTE) Fact Sheet for Patients: BloggerCourse.com  Fact Sheet for Healthcare Providers: SeriousBroker.it  This test is not yet approved or cleared by the [place] FDA and has been authorized for detection and/or diagnosis of SARS-CoV-2 by FDA under an Emergency Use Authorization (EUA). This EUA will remain in effect (meaning this test can be used) for the duration of the COVID-19 declaration under Section 564(b)(1) of the Act, 21 U.S.C. section 360bbb-3(b)(1), unless the authorization is terminated or revoked.  Performed at [facility], [address], [place], [place] [address]   Blood gas, arterial (at [facility])     Status: Abnormal   Collection Time: 09/30/23  6:00 AM  Result Value Ref Range   pH, Arterial 7.43 7.35 - 7.45   pCO2 arterial 33 32 - 48 mmHg   pO2, Arterial 71 (L) 83 - 108 mmHg   Bicarbonate 21.9 20.0 - 28.0 mmol/L   Acid-base deficit 1.8 0.0 - 2.0 mmol/L   O2 Saturation 96.2 %   Patient temperature 37.0     Comment: Performed at [facility], [address] [place], [place] [address]  TSH     Status: None   Collection Time: 09/30/23  6:05 AM  Result Value Ref Range   TSH 0.852 0.350 - 4.500 uIU/mL    Comment: Performed by a 3rd Generation assay with a functional sensitivity of <=0.01 uIU/mL. Performed at [facility], [address], [place], [place] [address]   Cortisol     Status: None   Collection  Time: 09/30/23  6:05 AM  Result Value Ref Range   Cortisol, Plasma 20.3 ug/dL    Comment: (NOTE) AM    6.7 - 22.6 ug/dL PM   <91.4       ug/dL Performed at [facility], [address], [place], [place] [address]   CBG monitoring, ED     Status: None   Collection Time: 09/30/23  6:10 AM  Result Value Ref Range   Glucose-Capillary 77 70 - 99 mg/dL    Comment: Glucose reference range applies only to samples taken after fasting for at least 8 hours.  Blood culture (routine x 2)     Status: None (Preliminary result)   Collection Time: 09/30/23  6:40 AM   Specimen: BLOOD  Result Value Ref Range   Specimen Description      BLOOD RIGHT ANTECUBITAL Performed at [facility], [address], [place], [place] [address]    Special Requests      BOTTLES DRAWN AEROBIC AND ANAEROBIC Blood Culture results may not be optimal due to an inadequate volume of blood received in culture bottles  Performed at [facility], [address] [place], [place] [address]    Culture      NO GROWTH <12 HOURS Performed at [facility], [address] [place], [place] [address]    Report Status PENDING   Blood culture (routine x 2)     Status: None (Preliminary result)   Collection Time: 09/30/23  6:45 AM   Specimen: BLOOD  Result Value Ref Range   Specimen Description      BLOOD BLOOD LEFT FOREARM Performed at [facility], [address] [place], [place] [address]    Special Requests      BOTTLES DRAWN AEROBIC AND ANAEROBIC Blood Culture results may not be optimal due to an inadequate volume of blood received in culture bottles Performed at [facility], [address], [place], [place] [address]    Culture      NO GROWTH <12 HOURS Performed at [facility], [address] [place], [place] [address]    Report Status PENDING   CBG monitoring, ED     Status: Abnormal   Collection Time: 09/30/23  7:58 AM   Result Value Ref Range   Glucose-Capillary 21 (LL) 70 - 99 mg/dL    Comment: Glucose reference range applies only to samples taken after fasting for at least 8 hours.   Comment 1 Notify RN   Lactic acid, plasma     Status: None   Collection Time: 09/30/23  8:00 AM  Result Value Ref Range   Lactic Acid, Venous 1.1 0.5 - 1.9 mmol/L    Comment: Performed at [facility], [address] [place], [place] [address]  CBG monitoring, ED     Status: Abnormal   Collection Time: 09/30/23  8:00 AM  Result Value Ref Range   Glucose-Capillary 38 (LL) 70 - 99 mg/dL    Comment: Glucose reference range applies only to samples taken after fasting for at least 8 hours.   Comment 1 Notify RN   CBG monitoring, ED     Status: Abnormal   Collection Time: 09/30/23  8:26 AM  Result Value Ref Range   Glucose-Capillary 112 (H) 70 - 99 mg/dL    Comment: Glucose reference range applies only to samples taken after fasting for at least 8 hours.  POC CBG, ED     Status: Abnormal   Collection Time: 09/30/23 10:48 AM  Result Value Ref Range   Glucose-Capillary 39 (LL) 70 - 99 mg/dL    Comment: Glucose reference range applies only to samples taken after fasting for at least 8 hours.   Comment 1 Notify RN   CBG monitoring, ED     Status: Abnormal   Collection Time: 09/30/23 11:20 AM  Result Value Ref Range   Glucose-Capillary 135 (H) 70 - 99 mg/dL    Comment: Glucose reference range applies only to samples taken after fasting for at least 8 hours.  CBG monitoring, ED     Status: None   Collection Time: 09/30/23 12:03 PM  Result Value Ref Range   Glucose-Capillary 94 70 - 99 mg/dL    Comment: Glucose reference range applies only to samples taken after fasting for at least 8 hours.   Comment 1 Notify RN   CBG monitoring, ED     Status: None   Collection Time: 09/30/23  4:37 PM  Result Value Ref Range   Glucose-Capillary 90 70 - 99 mg/dL    Comment: Glucose reference  range applies only to  samples taken after fasting for at least 8 hours.  Basic metabolic panel     Status: Abnormal   Collection Time: 09/30/23  5:58 PM  Result Value Ref Range   Sodium 131 (L) 135 - 145 mmol/L   Potassium 4.1 3.5 - 5.1 mmol/L   Chloride 104 98 - 111 mmol/L   CO2 19 (L) 22 - 32 mmol/L   Glucose, Bld 73 70 - 99 mg/dL    Comment: Glucose reference range applies only to samples taken after fasting for at least 8 hours.   BUN 22 8 - 23 mg/dL   Creatinine, Ser 1.61 (H) 0.61 - 1.24 mg/dL   Calcium 7.5 (L) 8.9 - 10.3 mg/dL   GFR, Estimated 49 (L) >60 mL/min    Comment: (NOTE) Calculated using the CKD-EPI Creatinine Equation (2021)    Anion gap 8 5 - 15    Comment: Performed at [facility], [address], [place], [place] [address]  CBG monitoring, ED     Status: Abnormal   Collection Time: 09/30/23 10:00 PM  Result Value Ref Range   Glucose-Capillary 150 (H) 70 - 99 mg/dL    Comment: Glucose reference range applies only to samples taken after fasting for at least 8 hours.   CT HEAD WO CONTRAST Result Date: 09/30/2023 CLINICAL DATA:  Mental status change, persistent or worsening. EXAM: CT HEAD WITHOUT CONTRAST TECHNIQUE: Contiguous axial images were obtained from the base of the skull through the vertex without intravenous contrast. RADIATION DOSE REDUCTION: This exam was performed according to the departmental dose-optimization program which includes automated exposure control, adjustment of the mA and/or kV according to patient size and/or use of iterative reconstruction technique. COMPARISON:  09/24/2023 FINDINGS: Brain: No evidence of acute infarction, hemorrhage, hydrocephalus, extra-axial collection or mass lesion/mass effect. Vascular: No hyperdense vessel or unexpected calcification. Skull: Normal. Negative for fracture or focal lesion. Sinuses/Orbits: No acute finding. IMPRESSION: No acute or interval finding. Electronically Signed   By: Tiburcio Pea M.D.   On: 09/30/2023  06:44   DG Chest Port 1 View Result Date: 09/30/2023 CLINICAL DATA:  Altered mental status. EXAM: PORTABLE CHEST 1 VIEW COMPARISON:  08/25/2023 FINDINGS: Low volume film. The cardio pericardial silhouette is enlarged. There is pulmonary vascular congestion without overt pulmonary edema. No overt airspace pulmonary edema. No pleural effusion. No acute bony abnormality. Telemetry leads overlie the chest. IMPRESSION: Low lung volumes with vascular congestion. Electronically Signed   By: Kennith Center M.D.   On: 09/30/2023 05:45    Pending Labs Unresulted Labs (From admission, onward)     Start     Ordered   10/01/23 0500  Basic metabolic panel  Tomorrow morning,   R        09/30/23 1121   10/01/23 0500  CBC  Tomorrow morning,   R        09/30/23 1121   09/30/23 1130  Remove and replace urinary cath (placed > 5 days) then obtain urine culture from new indwelling urinary catheter.  (Urine Culture)  Once,   R       Question:  Indication  Answer:  Suprapubic pain   09/30/23 1130            Vitals/Pain Today's Vitals   09/30/23 1930 09/30/23 2000 09/30/23 2155 09/30/23 2200  BP: 99/61 98/62 99/64  106/65  Pulse: 64 66 73 78  Resp:   18   Temp:   97.9 F (36.6 C)  TempSrc:   Oral   SpO2: 97% 95% 99% 100%  PainSc:        Isolation Precautions No active isolations  Medications Medications  dextrose 10 % infusion ( Intravenous Rate/Dose Change 09/30/23 1115)  lactated ringers infusion ( Intravenous New Bag/Given 09/30/23 0906)  cefTRIAXone (ROCEPHIN) 1 g in sodium chloride 0.9 % 100 mL IVPB (has no administration in time range)  aspirin EC tablet 81 mg (81 mg Oral Given 09/30/23 1343)  temazepam (RESTORIL) capsule 30 mg (30 mg Oral Given 09/30/23 2242)  tamsulosin (FLOMAX) capsule 0.4 mg (0.4 mg Oral Given 09/30/23 2242)  cariprazine (VRAYLAR) capsule 6 mg (6 mg Oral Given 09/30/23 1344)  bethanechol (URECHOLINE) tablet 5 mg (5 mg Oral Given 09/30/23 2252)  insulin aspart (novoLOG) injection  0-15 Units ( Subcutaneous Not Given 09/30/23 1648)  acetaminophen (TYLENOL) tablet 650 mg (has no administration in time range)    Or  acetaminophen (TYLENOL) suppository 650 mg (has no administration in time range)  traZODone (DESYREL) tablet 50 mg (has no administration in time range)  ondansetron (ZOFRAN) tablet 4 mg (has no administration in time range)    Or  ondansetron (ZOFRAN) injection 4 mg (has no administration in time range)  albuterol (PROVENTIL) (2.5 MG/3ML) 0.083% nebulizer solution 2.5 mg (has no administration in time range)  senna (SENOKOT) tablet 17.2 mg (has no administration in time range)  heparin injection 5,000 Units (5,000 Units Subcutaneous Given 09/30/23 2242)  sodium chloride 0.9 % bolus 1,000 mL (0 mLs Intravenous Stopped 09/30/23 0907)  dextrose 50 % solution 50 mL (50 mLs Intravenous Given 09/30/23 0609)  cefTRIAXone (ROCEPHIN) 2 g in sodium chloride 0.9 % 100 mL IVPB (0 g Intravenous Stopped 09/30/23 0718)  sodium chloride 0.9 % bolus 2,000 mL (0 mLs Intravenous Stopped 09/30/23 0749)  potassium chloride 10 mEq in 100 mL IVPB (0 mEq Intravenous Stopped 09/30/23 1409)  potassium chloride SA (KLOR-CON M) CR tablet 40 mEq (40 mEq Oral Given 09/30/23 0904)  magnesium oxide (MAG-OX) tablet 800 mg (800 mg Oral Given 09/30/23 0905)  dextrose 50 % solution 50 mL (50 mLs Intravenous Given 09/30/23 0809)  hydrocortisone sodium succinate (SOLU-CORTEF) 100 MG injection 100 mg (100 mg Intravenous Given 09/30/23 0814)  dextrose 50 % solution 50 mL (50 mLs Intravenous Given 09/30/23 1057)    Mobility walks with person assist

## 2023-09-30 NOTE — ED Notes (Signed)
BS 150

## 2023-09-30 NOTE — ED Notes (Signed)
 Reported by Dayshift nurse that patient foley has been replaced

## 2023-09-30 NOTE — ED Notes (Signed)
 Patient transported to CT

## 2023-09-30 NOTE — H&P (Signed)
 History and Physical  Edwin [name] ZOX:[id number] DOB: January 04, 1956 DOA: 09/30/2023  PCP: [facility]   Chief Complaint: Weakness  HPI: Edwin [name] is a 68 y.o. male with medical history significant for schizophrenia, CKD stage III, hyperlipidemia, diabetes being admitted to the hospital with severe sepsis likely due to UTI.  He was recently discharged from this facility on 2/27 after a stay for hypoglycemia which was felt due to continued home oral diabetic medication despite improved blood sugars after stopping one of his psychiatric medications.  His home Amaryl, Jardiance, and metformin were discontinued, blood sugars remained stable and he was discharged home in stable condition.  EMS was called to his home earlier this morning, per records his wife states that he has been having some nausea vomiting, has not been taking his Vraylar.  Patient is quite weak, found to be hypoglycemic.  1 dose of glucagon was given en route, CBG 54 on arrival.  He was placed on IV fluids, placed on D10 drip despite which she has had continued hypoglycemia.  Workup as detailed below shows evidence of UTI.  Patient denies any nausea, vomiting, states he has been having some diarrhea, also tells me that he has some low pelvic pain and feels like he might have a bladder infection.  He confirms he has not been taking any of his diabetic medications since hospital discharge, states he has hardly been eating since leaving the hospital.  Review of Systems: Please see HPI for pertinent positives and negatives. A complete 10 system review of systems are otherwise negative.  Past Medical History:  Diagnosis Date   Anxiety    Cancer (HCC)    prostate   CKD (chronic kidney disease) stage 3, GFR 30-59 ml/min (HCC)    Depression    GERD (gastroesophageal reflux disease)    under control   History of Clostridium difficile colitis    03-31-2015--  resolved   History of sepsis    secondary to UTI and ARF  x2  admission 03-31-2015 &  04-14-2015   Hyperlipidemia    Paranoid schizophrenia [facility])    Urinary incontinence    Past Surgical History:  Procedure Laterality Date   CERVICAL DISC SURGERY  1990's   CYSTOSCOPY N/A 03/08/2015   Procedure: CYSTOSCOPY;  Surgeon: Su Grand, MD;  Location: [facility];  Service: Urology;  Laterality: N/A;   CYSTOSCOPY  06/10/2015   Procedure: CYSTOSCOPY;  Surgeon: Malen Gauze, MD;  Location: [facility];  Service: Urology;;   PROSTATE BIOPSY N/A 03/08/2015   Procedure: BIOPSY TRANSRECTAL ULTRASONIC PROSTATE (TUBP);  Surgeon: Su Grand, MD;  Location: [facility];  Service: Urology;  Laterality: N/A;   TRANSTHORACIC ECHOCARDIOGRAM  06-11-2015   grade 1 diastolic dysfunction, ef 55-60%;  trivial TR   TRANSURETHRAL RESECTION OF BLADDER TUMOR N/A 03/08/2015   Procedure: TRANSURETHRAL RESECTION OF BLADDER TUMOR (TURBT);  Surgeon: Su Grand, MD;  Location: [facility];  Service: Urology;  Laterality: N/A;  TUR BIOPSY BLADDER   TRANSURETHRAL RESECTION OF PROSTATE N/A 06/27/2015   Procedure: TRANSURETHRAL RESECTION OF THE PROSTATE WITH GYRUS INSTRUMENTS;  Surgeon: Malen Gauze, MD;  Location: [facility];  Service: Urology;  Laterality: N/A;   TRANSURETHRAL RESECTION OF PROSTATE N/A 07/09/2016   Procedure: TRANSURETHRAL RESECTION OF THE PROSTATE (TURP);  Surgeon: Malen Gauze, MD;  Location: [facility];  Service: Urology;  Laterality: N/A;   Social History:  reports that he has been smoking cigarettes. He has a  40 pack-year smoking history. He has never used smokeless tobacco. He reports that he does not drink alcohol and does not use drugs.  No Known Allergies  Family History  Problem Relation Age of Onset   Breast cancer Mother      Prior to Admission medications   Medication Sig Start Date End Date Taking? Authorizing Provider  acetaminophen (TYLENOL) 325 MG tablet Take 650 mg by mouth every 6 (six) hours as needed for  moderate pain.    [provider]  aspirin EC 81 MG tablet Take 81 mg by mouth daily.    [provider]  bethanechol (URECHOLINE) 5 MG tablet TAKE 1 TABLET (5 MG TOTAL) BY MOUTH IN THE MORNING AND AT BEDTIME 06/04/23   McKenzie, Mardene Celeste, MD  CAPLYTA 42 MG capsule Take 42 mg by mouth every morning. 08/20/23   [provider]  Continuous Glucose Receiver (FREESTYLE LIBRE 3 READER) DEVI Use to check blood sugar 3 (three) times daily before meals. 06/13/23   Shalhoub, Deno Lunger, MD  Continuous Glucose Sensor (FREESTYLE LIBRE 3 SENSOR) MISC Place 1 sensor on the skin every 14 days. Use to check glucose continuously 3 times a day before meals 06/13/23   Shalhoub, Deno Lunger, MD  EX-LAX 15 MG CHEW Chew 15 mg by mouth every 6 (six) hours as needed (for constipation).    [provider]  finasteride (PROSCAR) 5 MG tablet TAKE 1 TABLET BY MOUTH EVERY DAY Patient not taking: Reported on 09/24/2023 05/17/20   Malen Gauze, MD  furosemide (LASIX) 20 MG tablet Take 20 mg by mouth daily as needed for fluid or edema. 05/11/22   [provider]  Ipratropium-Albuterol (COMBIVENT RESPIMAT) 20-100 MCG/ACT AERS respimat INHALE 1 PUFF INTO THE LUNGS EVERY 6 HOURS AS NEEDED FOR WHEEZING OR SHORTNESS OF BREATH. 06/04/22   Coralyn Helling, MD  Multiple Vitamin (MULTIVITAMIN) tablet Take 1 tablet by mouth daily.    [provider]  tamsulosin (FLOMAX) 0.4 MG CAPS capsule TAKE 1 CAPSULE BY MOUTH TWICE A DAY 06/18/23   McKenzie, Mardene Celeste, MD  Tart Cherry 500 MG CAPS Take 500 mg by mouth daily. Patient not taking: Reported on 09/24/2023    [provider]  temazepam (RESTORIL) 30 MG capsule Take 30 mg by mouth at bedtime. 09/21/23   [provider]  Vitamin D, Ergocalciferol, (DRISDOL) 1.25 MG (50000 UNIT) CAPS capsule Take 50,000 Units by mouth every 7 (seven) days.    [provider]  VRAYLAR 6 MG CAPS Take 6 mg by mouth every morning. 10/24/21    [provider]    Physical Exam: BP 93/62   Pulse 65   Temp 98.4 F (36.9 C) (Oral)   Resp 18   SpO2 99%  General: Looks chronically ill and tired, but alert, oriented, calm, in no acute distress  Cardiovascular: RRR, no murmurs or rubs, no peripheral edema  Respiratory: clear to auscultation bilaterally, no wheezes, no crackles  Abdomen: soft, nontender, distended/tympanic, normal bowel tones heard  Skin: dry, no rashes  Musculoskeletal: no joint effusions, normal range of motion  Psychiatric: appropriate affect, normal speech  Neurologic: extraocular muscles intact, clear speech, moving all extremities with intact sensorium         Labs on Admission:  Basic Metabolic Panel: Recent Labs  Lab 09/24/23 1949 09/25/23 0343 09/26/23 0552 09/30/23 0529  NA 132* 130* 130* 127*  K 3.1* 3.5 3.7 2.4*  CL 95* 96* 97* 96*  CO2 25 24 23  20*  GLUCOSE 57* 53* 153* 98  BUN 18 15 15  30*  CREATININE 1.58* 1.61* 1.68* 2.08*  CALCIUM 8.7* 8.6* 8.7* 8.0*  MG  --   --   --  2.1   Liver Function Tests: Recent Labs  Lab 09/24/23 1949 09/30/23 0529  AST 12* 17  ALT 9 17  ALKPHOS 65 57  BILITOT 0.7 0.5  PROT 7.1 6.4*  ALBUMIN 3.2* 2.7*   No results for input(s): "LIPASE", "AMYLASE" in the last 168 hours. Recent Labs  Lab 09/30/23 0529  AMMONIA 10   CBC: Recent Labs  Lab 09/24/23 1949 09/25/23 0343 09/30/23 0529  WBC 13.1* 11.7* 15.0*  NEUTROABS  --   --  13.4*  HGB 12.3* 12.5* 10.8*  HCT 38.3* 38.6* 33.8*  MCV 79.3* 78.8* 78.6*  PLT 303 293 281   Cardiac Enzymes: Recent Labs  Lab 09/24/23 1949  CKTOTAL 79   BNP (last 3 results) Recent Labs    06/11/23 1623  BNP 65.4    ProBNP (last 3 results) No results for input(s): "PROBNP" in the last 8760 hours.  CBG: Recent Labs  Lab 09/30/23 0610 09/30/23 0758 09/30/23 0800 09/30/23 0826 09/30/23 1048  GLUCAP 77 21* 38* 112* 39*    Radiological Exams on Admission: CT HEAD WO CONTRAST Result  Date: 09/30/2023 CLINICAL DATA:  Mental status change, persistent or worsening. EXAM: CT HEAD WITHOUT CONTRAST TECHNIQUE: Contiguous axial images were obtained from the base of the skull through the vertex without intravenous contrast. RADIATION DOSE REDUCTION: This exam was performed according to the departmental dose-optimization program which includes automated exposure control, adjustment of the mA and/or kV according to patient size and/or use of iterative reconstruction technique. COMPARISON:  09/24/2023 FINDINGS: Brain: No evidence of acute infarction, hemorrhage, hydrocephalus, extra-axial collection or mass lesion/mass effect. Vascular: No hyperdense vessel or unexpected calcification. Skull: Normal. Negative for fracture or focal lesion. Sinuses/Orbits: No acute finding. IMPRESSION: No acute or interval finding. Electronically Signed   By: Tiburcio Pea M.D.   On: 09/30/2023 06:44   DG Chest Port 1 View Result Date: 09/30/2023 CLINICAL DATA:  Altered mental status. EXAM: PORTABLE CHEST 1 VIEW COMPARISON:  08/25/2023 FINDINGS: Low volume film. The cardio pericardial silhouette is enlarged. There is pulmonary vascular congestion without overt pulmonary edema. No overt airspace pulmonary edema. No pleural effusion. No acute bony abnormality. Telemetry leads overlie the chest. IMPRESSION: Low lung volumes with vascular congestion. Electronically Signed   By: Kennith Center M.D.   On: 09/30/2023 05:45   Assessment/Plan Edwin [name] is a 68 y.o. male with medical history significant for schizophrenia, CKD stage III, hyperlipidemia, diabetes being admitted to the hospital with severe sepsis likely due to UTI.   Severe sepsis-meeting criteria with tachypnea, leukocytosis, symptomatic hypotension, AKI.  Source is suspected catheter associated UTI. -Inpatient admission to stepdown -Patient is hypotensive, but currently hemodynamically stable, mentating normally without tachycardia.  Goal will be to  maintain MAP above 65. -Follow-up blood and urine cultures -will ensure that Foley catheter is replaced, and new UA/cultures obtained -Continue empiric IV Rocephin  Hypoglycemia-most likely due to poor oral intake in the setting of an acute infection.  However when combined with hyponatremia, hypokalemia, hypotension it does raise the concern for adrenal insufficiency though he has no particular reason to be suffering from this.  He was given a one-time dose of IV hydrocortisone in the ER this morning. -Continue D10 infusion -Monitor blood sugars closely, and encourage p.o. intake -Will keep him on  a regular diet for now  Hypokalemia-likely due to GI losses, patient states he has been having diarrhea since hospital discharge.  Magnesium level is normal. -Replacing potassium 60 mEq IV.  Recheck potassium level in the morning  Hyponatremia-I suspect this is a hypovolemic hyponatremia secondary to low p.o. intake and diarrhea.  Continue LR infusion, recheck BMP this afternoon  AKI-in the setting of CKD stage IIIb, baseline renal function is about 1.6.  I suspect this is ATN from dehydration/hypotension and also complicated by his acute infection. -Avoid nephrotoxins, treat infection and dehydration as above -Monitor renal function with daily labs, if not improving consider imaging, he has chronic bilateral hydronephrosis  Type 2 diabetes-with most recent history of hypoglycemia. -Regular diet as above -Will monitor his blood sugars and have ordered sliding scale insulin if needed  BPH-with chronic indwelling Foley, continue Flomax, Foley catheter replaced this morning in the ER.  DVT prophylaxis: Subcutaneous heparin    Code Status: Full Code  Consults called: None  Admission status: The appropriate patient status for this patient is INPATIENT. Inpatient status is judged to be reasonable and necessary in order to provide the required intensity of service to ensure the patient's safety. The  patient's presenting symptoms, physical exam findings, and initial radiographic and laboratory data in the context of their chronic comorbidities is felt to place them at high risk for further clinical deterioration. Furthermore, it is not anticipated that the patient will be medically stable for discharge from the hospital within 2 midnights of admission.    I certify that at the point of admission it is my clinical judgment that the patient will require inpatient hospital care spanning beyond 2 midnights from the point of admission due to high intensity of service, high risk for further deterioration and high frequency of surveillance required  Time spent: 59 minutes  Tawanna Funk Sharlette Dense MD [facility] Pager [phone]  If 7PM-7AM, please contact night-coverage [email or web address] Password [email or web address]  09/30/2023, 11:22 AM

## 2023-10-01 ENCOUNTER — Inpatient Hospital Stay (HOSPITAL_COMMUNITY)

## 2023-10-01 DIAGNOSIS — N1831 Chronic kidney disease, stage 3a: Secondary | ICD-10-CM

## 2023-10-01 DIAGNOSIS — E11649 Type 2 diabetes mellitus with hypoglycemia without coma: Secondary | ICD-10-CM

## 2023-10-01 DIAGNOSIS — A419 Sepsis, unspecified organism: Secondary | ICD-10-CM | POA: Diagnosis not present

## 2023-10-01 DIAGNOSIS — N179 Acute kidney failure, unspecified: Secondary | ICD-10-CM

## 2023-10-01 DIAGNOSIS — F200 Paranoid schizophrenia: Secondary | ICD-10-CM

## 2023-10-01 DIAGNOSIS — E119 Type 2 diabetes mellitus without complications: Secondary | ICD-10-CM

## 2023-10-01 LAB — BLOOD CULTURE ID PANEL (REFLEXED) - BCID2

## 2023-10-01 LAB — BASIC METABOLIC PANEL
Anion gap: 9 (ref 5–15)
BUN: 18 mg/dL (ref 8–23)
CO2: 22 mmol/L (ref 22–32)
Calcium: 7.8 mg/dL — ABNORMAL LOW (ref 8.9–10.3)
Chloride: 102 mmol/L (ref 98–111)
Creatinine, Ser: 1.54 mg/dL — ABNORMAL HIGH (ref 0.61–1.24)
GFR, Estimated: 50 mL/min — ABNORMAL LOW (ref >60)
Glucose, Bld: 187 mg/dL — ABNORMAL HIGH (ref 70–99)
Potassium: 3.6 mmol/L (ref 3.5–5.1)
Sodium: 133 mmol/L — ABNORMAL LOW (ref 135–145)

## 2023-10-01 LAB — CBC
HCT: 34.3 % — ABNORMAL LOW (ref 39.0–52.0)
Hemoglobin: 10.7 g/dL — ABNORMAL LOW (ref 13.0–17.0)
MCH: 25.9 pg — ABNORMAL LOW (ref 26.0–34.0)
MCHC: 31.2 g/dL (ref 30.0–36.0)
MCV: 83.1 fL (ref 80.0–100.0)
Platelets: 244 10*3/uL (ref 150–400)
RBC: 4.13 MIL/uL — ABNORMAL LOW (ref 4.22–5.81)
RDW: 16 % — ABNORMAL HIGH (ref 11.5–15.5)
WBC: 8.4 10*3/uL (ref 4.0–10.5)
nRBC: 0 % (ref 0.0–0.2)

## 2023-10-01 LAB — MRSA NEXT GEN BY PCR, NASAL: MRSA by PCR Next Gen: NOT DETECTED

## 2023-10-01 LAB — GLUCOSE, CAPILLARY
Glucose-Capillary: 121 mg/dL — ABNORMAL HIGH (ref 70–99)
Glucose-Capillary: 122 mg/dL — ABNORMAL HIGH (ref 70–99)
Glucose-Capillary: 129 mg/dL — ABNORMAL HIGH (ref 70–99)
Glucose-Capillary: 138 mg/dL — ABNORMAL HIGH (ref 70–99)
Glucose-Capillary: 139 mg/dL — ABNORMAL HIGH (ref 70–99)
Glucose-Capillary: 164 mg/dL — ABNORMAL HIGH (ref 70–99)
Glucose-Capillary: 174 mg/dL — ABNORMAL HIGH (ref 70–99)
Glucose-Capillary: 184 mg/dL — ABNORMAL HIGH (ref 70–99)
Glucose-Capillary: 192 mg/dL — ABNORMAL HIGH (ref 70–99)

## 2023-10-01 LAB — URINE CULTURE: Culture: <10000 — AB

## 2023-10-01 NOTE — Hospital Course (Addendum)
 68 year old man PMH including diabetes mellitus on oral medications, schizophrenia, CKD, recently discharged in the hospital for hypoglycemia, who presented with nausea, vomiting and reported fall.  On last discharge, Amaryl, metformin and Jardiance were discontinued.  Unable to take home Vraylar.  Markedly hypoglycemic requiring multiple dextrose treatments.  Admitted for sepsis secondary to UTI, hypoglycemia.  Foley catheter changed in the emergency department.  Consultants None   Procedures/Events None

## 2023-10-01 NOTE — Progress Notes (Signed)
 PHARMACY - PHYSICIAN COMMUNICATION CRITICAL VALUE ALERT - BLOOD CULTURE IDENTIFICATION (BCID)  Edwin [name] is an 68 y.o. male who presented to [facility] on 09/30/2023 with a chief complaint of sepsis secondary to UTI, hypoglycemia. Foley catheter changed in the emergency department.   Micro:  3/3 UCx: (removed and replaced urinary catheter) 3/3 MRSA PCR: not detected 3/3 BCx: GPC in 1/4 bottles 3/4 BCID: Staphylococcus epidermidis  Assessment: Unclear significance of blood cultures with only one bottle positive.  Suspect contamination, but will continue to treat UTI.    Name of physician (or Provider) Contacted: Dr Irene Limbo  Current antibiotics:  Ceftriaxone  Changes to prescribed antibiotics recommended:  Patient is on recommended antibiotics - No changes needed  Results for orders placed or performed during the hospital encounter of 09/30/23  Blood Culture ID Panel (Reflexed) (Collected: 09/30/2023  6:45 AM)  Result Value Ref Range   Enterococcus faecalis NOT DETECTED NOT DETECTED   Enterococcus Faecium NOT DETECTED NOT DETECTED   Listeria monocytogenes NOT DETECTED NOT DETECTED   Staphylococcus species DETECTED (A) NOT DETECTED   Staphylococcus aureus (BCID) NOT DETECTED NOT DETECTED   Staphylococcus epidermidis DETECTED (A) NOT DETECTED   Staphylococcus lugdunensis NOT DETECTED NOT DETECTED   Streptococcus species NOT DETECTED NOT DETECTED   Streptococcus agalactiae NOT DETECTED NOT DETECTED   Streptococcus pneumoniae NOT DETECTED NOT DETECTED   Streptococcus pyogenes NOT DETECTED NOT DETECTED   A.calcoaceticus-baumannii NOT DETECTED NOT DETECTED   Bacteroides fragilis NOT DETECTED NOT DETECTED   Enterobacterales NOT DETECTED NOT DETECTED   Enterobacter cloacae complex NOT DETECTED NOT DETECTED   Escherichia coli NOT DETECTED NOT DETECTED   Klebsiella aerogenes NOT DETECTED NOT DETECTED   Klebsiella oxytoca NOT DETECTED NOT DETECTED   Klebsiella pneumoniae NOT DETECTED  NOT DETECTED   Proteus species NOT DETECTED NOT DETECTED   Salmonella species NOT DETECTED NOT DETECTED   Serratia marcescens NOT DETECTED NOT DETECTED   Haemophilus influenzae NOT DETECTED NOT DETECTED   Neisseria meningitidis NOT DETECTED NOT DETECTED   Pseudomonas aeruginosa NOT DETECTED NOT DETECTED   Stenotrophomonas maltophilia NOT DETECTED NOT DETECTED   Candida albicans NOT DETECTED NOT DETECTED   Candida auris NOT DETECTED NOT DETECTED   Candida glabrata NOT DETECTED NOT DETECTED   Candida krusei NOT DETECTED NOT DETECTED   Candida parapsilosis NOT DETECTED NOT DETECTED   Candida tropicalis NOT DETECTED NOT DETECTED   Cryptococcus neoformans/gattii NOT DETECTED NOT DETECTED   Methicillin resistance mecA/C NOT DETECTED NOT DETECTED    Lynann Beaver PharmD, BCPS [facility] main pharmacy [phone] 10/01/2023 8:25 AM

## 2023-10-01 NOTE — Progress Notes (Addendum)
 Progress Note   Patient: Edwin [name] UJW:[id number] DOB: 1956-04-14 DOA: 09/30/2023     1 DOS: the patient was seen and examined on 10/01/2023   Brief hospital course: 68 year old man PMH including diabetes mellitus on oral medications, schizophrenia, CKD, recently discharged in the hospital for hypoglycemia, who presented with nausea, vomiting and reported fall.  On last discharge, Amaryl, metformin and Jardiance were discontinued.  Unable to take home Vraylar.  Markedly hypoglycemic requiring multiple dextrose treatments.  Admitted for sepsis secondary to UTI, hypoglycemia.  Foley catheter changed in the emergency department.  Consultants None   Procedures/Events None  Assessment and Plan: Severe sepsis secondary to catheter associated UTI Met criteria with tachypnea, leukocytosis, AKI Blood pressure soft this morning will give further fluids but clinically otherwise appears well Follow culture data, continue empiric ceftriaxone  Recurrent hypoglycemia Diabetes mellitus type 2 Etiology unclear.  Possibly related to acute infection, poor oral intake, possible nausea and vomiting, possible accidental ongoing use of oral antihyperglycemic (medication prepared by family). On last discharge discussed in detail his medications, but still a possibility We off dextrose infusion this morning.  Assess diet intake. He reports some abdominal discomfort although his exam is benign.  Will check an abdominal x-ray. No oral antihyperglycemics. Check hemoglobin A1c Obtain further history from family if possible. May need to consider workup for more uncommon causes of hypoglycemia, but I think infection, poor oral intake seem most likely at this time  AKI superimposed on CKD stage IIIa Baseline creatinine around 1.6-1.7.  Admission creatinine 2.08. Suspect related to poor oral intake.  Creatinine is back to baseline.   Hyponatremia Hypokalemia, resolved Sodium level improved with fluids.  Check  BMP in AM.  BPH Chronic indwelling Foley catheter  Continue Flomax, Foley catheter replaced in the ER.  Schizophrenia Stable. Continue Vraylar.     Subjective:  Feels okay, reports perhaps some abdominal pain although history is variable  Physical Exam: Vitals:   10/01/23 0400 10/01/23 0500 10/01/23 0600 10/01/23 0800  BP: (!) 97/51 117/64 (!) 91/52   Pulse: 65 75 70   Resp: (!) 21 (!) 32 (!) 29   Temp:    97.7 F (36.5 C)  TempSrc:    Oral  SpO2: 97% 97% 93%    Physical Exam Vitals reviewed.  Constitutional:      General: He is not in acute distress.    Appearance: He is not toxic-appearing.  Cardiovascular:     Rate and Rhythm: Normal rate and regular rhythm.     Heart sounds: No murmur heard. Pulmonary:     Effort: Pulmonary effort is normal. No respiratory distress.     Breath sounds: No wheezing, rhonchi or rales.  Abdominal:     General: There is no distension.     Palpations: Abdomen is soft. There is no mass.     Tenderness: There is no abdominal tenderness. There is no guarding.  Musculoskeletal:     Right lower leg: No edema.     Left lower leg: No edema.  Neurological:     Mental Status: He is alert.  Psychiatric:        Mood and Affect: Mood normal.        Behavior: Behavior normal.     Comments: Has a slow speech pattern, blunted affect, appears to be at baseline     Data Reviewed: Creatinine stable at 1.54 CBG up to 184 on dextrose infusion Hemoglobin slightly lower at 10.7 compared to a few days ago. TSH  and cortisol within normal limits Blood culture staph epi, likely contaminant Urine culture pending  Family Communication:   Disposition: Status is: Inpatient Remains inpatient appropriate because: Recurrent hypoglycemia     Time spent: 30 minutes  Author: Brendia Sacks, MD 10/01/2023 9:23 AM  For on call review [email or web address]

## 2023-10-02 ENCOUNTER — Encounter (HOSPITAL_COMMUNITY): Payer: Self-pay | Admitting: Internal Medicine

## 2023-10-02 ENCOUNTER — Other Ambulatory Visit: Payer: Self-pay

## 2023-10-02 DIAGNOSIS — A419 Sepsis, unspecified organism: Secondary | ICD-10-CM | POA: Diagnosis not present

## 2023-10-02 DIAGNOSIS — R6520 Severe sepsis without septic shock: Secondary | ICD-10-CM | POA: Diagnosis not present

## 2023-10-02 LAB — BASIC METABOLIC PANEL
Anion gap: 9 (ref 5–15)
BUN: 13 mg/dL (ref 8–23)
CO2: 22 mmol/L (ref 22–32)
Calcium: 8 mg/dL — ABNORMAL LOW (ref 8.9–10.3)
Chloride: 104 mmol/L (ref 98–111)
Creatinine, Ser: 1.49 mg/dL — ABNORMAL HIGH (ref 0.61–1.24)
GFR, Estimated: 52 mL/min — ABNORMAL LOW (ref >60)
Glucose, Bld: 138 mg/dL — ABNORMAL HIGH (ref 70–99)
Potassium: 3.3 mmol/L — ABNORMAL LOW (ref 3.5–5.1)
Sodium: 135 mmol/L (ref 135–145)

## 2023-10-02 LAB — GLUCOSE, CAPILLARY
Glucose-Capillary: 124 mg/dL — ABNORMAL HIGH (ref 70–99)
Glucose-Capillary: 149 mg/dL — ABNORMAL HIGH (ref 70–99)
Glucose-Capillary: 134 mg/dL — ABNORMAL HIGH (ref 70–99)
Glucose-Capillary: 133 mg/dL — ABNORMAL HIGH (ref 70–99)
Glucose-Capillary: 109 mg/dL — ABNORMAL HIGH (ref 70–99)
Glucose-Capillary: 130 mg/dL — ABNORMAL HIGH (ref 70–99)

## 2023-10-02 MED ORDER — ENSURE ENLIVE PO LIQD
237.0000 mL | Freq: Two times a day (BID) | ORAL | Status: DC
  Administered 2023-10-02: 237 mL via ORAL

## 2023-10-02 MED ORDER — ASPIRIN EC 81 MG PO TBEC: 81.0000 mg | DELAYED_RELEASE_TABLET | Freq: Every day | ORAL | 0 refills | Status: AC

## 2023-10-02 MED ORDER — CEPHALEXIN 250 MG PO CAPS: 250.0000 mg | ORAL_CAPSULE | Freq: Four times a day (QID) | ORAL | 0 refills | Status: AC

## 2023-10-02 NOTE — Discharge Summary (Signed)
 Physician Discharge Summary  Edwin [name] NFA:[id number] DOB: May 15, 1956 DOA: 09/30/2023  PCP: Gean Birchwood, [facility]  Admit date: 09/30/2023 Discharge date: 10/02/2023  Admitted From: Home Disposition: Home  Recommendations for Outpatient Follow-up:  Follow up with PCP in 1-2 weeks  Home Health: None Equipment/Devices: None  Discharge Condition: Stable CODE STATUS: Full Diet recommendation: Low-salt low-fat low-carb diet  Brief/Interim Summary: 68 year old man PMH including diabetes mellitus on oral medications, schizophrenia, CKD, recently discharged in the hospital for hypoglycemia, who presented with nausea, vomiting and reported fall.  On last discharge, Amaryl, metformin and Jardiance were discontinued.  Unable to take home Vraylar.  Markedly hypoglycemic requiring multiple dextrose treatments.  Admitted for sepsis secondary to UTI, hypoglycemia.  Foley catheter changed in the emergency department.  Patient admitted as above with recurrent hypoglycemia with concern over catheter associated UTI meeting sepsis criteria as below.  Patient improved quite quickly.  Overnight patient's renal function returned essentially to baseline on supportive care and antibiotics.  Patient's hyperglycemia appears to have resolved with p.o. intake.  Unclear etiology given patient does not use insulin, initially thought to be a medication or long-acting insulin incidental overdose but given patient's medical history and home med list this is unlikely.  Regardless patient at this time is normoglycemic without any further symptoms, tolerating p.o. well requesting discharge home which is certainly reasonable we can continue to finish his antibiotic course via p.o. antibiotics.  He is otherwise stable and agreeable for discharge home.  Discharge Diagnoses:  Principal Problem:   Severe sepsis (HCC) Active Problems:   Sepsis secondary to UTI (HCC)   Paranoid schizophrenia, chronic condition (HCC)   AKI (acute  kidney injury) (HCC)   Hyponatremia   Chronic kidney disease, stage 3a (HCC)   DM type 2 (diabetes mellitus, type 2) (HCC)  Severe sepsis secondary to catheter associated UTI, resolving Met criteria with tachypnea, leukocytosis, with notable source   Recurrent hypoglycemia Diabetes mellitus type 2, uncontrolled with hyperglycemia -Most recent hemoglobin A1c 14.93 months ago, no repeat labs at this time, discussed need for more appropriate diet given patient's hypoglycemia at intake, now resolved with p.o. intake -Discussed need for appropriate follow-up for diabetic medication and testing.  AKI superimposed on CKD stage IIIa Baseline creatinine 1.6, admission creatinine 2.1, downtrending, currently 1.5   Hyponatremia Hypokalemia, resolved Hypovolemia in the setting of poor p.o. intake Resolved with IV and subsequent p.o. fluids   BPH Chronic indwelling Foley catheter  Continue Flomax, Foley catheter replaced in the ER.   Schizophrenia Stable. Continue Vraylar.   Discharge Instructions   Allergies as of 10/02/2023   No Known Allergies      Medication List     STOP taking these medications    finasteride 5 MG tablet Commonly known as: PROSCAR   FreeStyle Libre 3 Reader [employer] 3 Sensor Misc   tamsulosin 0.4 MG Caps capsule Commonly known as: FLOMAX       TAKE these medications    acetaminophen 325 MG tablet Commonly known as: TYLENOL Take 650 mg by mouth every 6 (six) hours as needed for moderate pain.   aspirin EC 81 MG tablet Take 1 tablet (81 mg total) by mouth daily.   bethanechol 5 MG tablet Commonly known as: URECHOLINE TAKE 1 TABLET (5 MG TOTAL) BY MOUTH IN THE MORNING AND AT BEDTIME   Caplyta 42 MG capsule Generic drug: lumateperone tosylate Take 42 mg by mouth every morning.   cephALEXin 250 MG capsule Commonly known as:  KEFLEX Take 1 capsule (250 mg total) by mouth 4 (four) times daily for 7 days.   Combivent Respimat  20-100 MCG/ACT Aers respimat Generic drug: Ipratropium-Albuterol INHALE 1 PUFF INTO THE LUNGS EVERY 6 HOURS AS NEEDED FOR WHEEZING OR SHORTNESS OF BREATH. What changed: See the new instructions.   Ex-Lax 15 MG Chew Generic drug: Sennosides Chew 15-30 mg by mouth 2 (two) times daily as needed (for constipation).   furosemide 20 MG tablet Commonly known as: LASIX Take 20 mg by mouth daily as needed for fluid or edema.   polyethylene glycol powder 17 GM/SCOOP powder Commonly known as: GLYCOLAX/MIRALAX Take 17 g by mouth daily as needed for mild constipation.   temazepam 30 MG capsule Commonly known as: RESTORIL Take 30 mg by mouth at bedtime.        No Known Allergies  Consultations: None  Procedures/Studies: DG Abd Portable 1V Result Date: 10/01/2023 CLINICAL DATA:  Abdominal pain. EXAM: PORTABLE ABDOMEN - 1 VIEW COMPARISON:  November 11, 2020. FINDINGS: The bowel gas pattern is normal. No radio-opaque calculi or other significant radiographic abnormality are seen. IMPRESSION: No abnormal bowel dilatation is noted. Electronically Signed   By: Lupita Raider M.D.   On: 10/01/2023 13:48   CT HEAD WO CONTRAST Result Date: 09/30/2023 CLINICAL DATA:  Mental status change, persistent or worsening. EXAM: CT HEAD WITHOUT CONTRAST TECHNIQUE: Contiguous axial images were obtained from the base of the skull through the vertex without intravenous contrast. RADIATION DOSE REDUCTION: This exam was performed according to the departmental dose-optimization program which includes automated exposure control, adjustment of the mA and/or kV according to patient size and/or use of iterative reconstruction technique. COMPARISON:  09/24/2023 FINDINGS: Brain: No evidence of acute infarction, hemorrhage, hydrocephalus, extra-axial collection or mass lesion/mass effect. Vascular: No hyperdense vessel or unexpected calcification. Skull: Normal. Negative for fracture or focal lesion. Sinuses/Orbits: No acute  finding. IMPRESSION: No acute or interval finding. Electronically Signed   By: Tiburcio Pea M.D.   On: 09/30/2023 06:44   DG Chest Port 1 View Result Date: 09/30/2023 CLINICAL DATA:  Altered mental status. EXAM: PORTABLE CHEST 1 VIEW COMPARISON:  08/25/2023 FINDINGS: Low volume film. The cardio pericardial silhouette is enlarged. There is pulmonary vascular congestion without overt pulmonary edema. No overt airspace pulmonary edema. No pleural effusion. No acute bony abnormality. Telemetry leads overlie the chest. IMPRESSION: Low lung volumes with vascular congestion. Electronically Signed   By: Kennith Center M.D.   On: 09/30/2023 05:45   CT Cervical Spine Wo Contrast Result Date: 09/24/2023 CLINICAL DATA:  Found down, neck trauma EXAM: CT CERVICAL SPINE WITHOUT CONTRAST TECHNIQUE: Multidetector CT imaging of the cervical spine was performed without intravenous contrast. Multiplanar CT image reconstructions were also generated. RADIATION DOSE REDUCTION: This exam was performed according to the departmental dose-optimization program which includes automated exposure control, adjustment of the mA and/or kV according to patient size and/or use of iterative reconstruction technique. COMPARISON:  06/11/2023 FINDINGS: Alignment: Alignment is grossly anatomic. Skull base and vertebrae: No acute fracture. No primary bone lesion or focal pathologic process. Soft tissues and spinal canal: No prevertebral fluid or swelling. No visible canal hematoma. Disc levels: Prior C4-5 ACDF. Stable spondylosis with prominent anterior osteophyte formation at C3-4, C5-6, and C6-7. Upper chest: Airway is patent.  Lung apices are clear. Other: Reconstructed images demonstrate no additional findings. IMPRESSION: 1. No acute cervical spine fracture. 2. Stable postsurgical and degenerative changes. Electronically Signed   By: Sharlet Salina M.D.   On:  09/24/2023 21:11   CT Head Wo Contrast Result Date: 09/24/2023 CLINICAL DATA:  Found  down, head trauma EXAM: CT HEAD WITHOUT CONTRAST TECHNIQUE: Contiguous axial images were obtained from the base of the skull through the vertex without intravenous contrast. RADIATION DOSE REDUCTION: This exam was performed according to the departmental dose-optimization program which includes automated exposure control, adjustment of the mA and/or kV according to patient size and/or use of iterative reconstruction technique. COMPARISON:  06/11/2023 FINDINGS: Brain: No acute infarct or hemorrhage. Lateral ventricles and midline structures are unremarkable. No acute extra-axial fluid collections. No mass effect. Vascular: No hyperdense vessel or unexpected calcification. Skull: Normal. Negative for fracture or focal lesion. Sinuses/Orbits: No acute finding. Other: None. IMPRESSION: 1. No acute intracranial process. Electronically Signed   By: Sharlet Salina M.D.   On: 09/24/2023 21:09     Subjective: No acute issues or events overnight   Discharge Exam: Vitals:   10/02/23 0346 10/02/23 0743  BP: 95/65 109/62  Pulse: 66 64  Resp: 18 20  Temp: 98 F (36.7 C) 97.8 F (36.6 C)  SpO2: 95% 98%   Vitals:   10/01/23 2200 10/01/23 2301 10/02/23 0346 10/02/23 0743  BP: 107/60 110/62 95/65 109/62  Pulse: 73 66 66 64  Resp: (!) 33 18 18 20   Temp:  97.8 F (36.6 C) 98 F (36.7 C) 97.8 F (36.6 C)  TempSrc:  Oral Oral Oral  SpO2: 94% 97% 95% 98%    General: Pt is alert, awake, not in acute distress Cardiovascular: RRR, S1/S2 +, no rubs, no gallops Respiratory: CTA bilaterally, no wheezing, no rhonchi Abdominal: Soft, NT, ND, bowel sounds + Extremities: no edema, no cyanosis    The results of significant diagnostics from this hospitalization (including imaging, microbiology, ancillary and laboratory) are listed below for reference.     Microbiology: Recent Results (from the past 240 hours)  MRSA Next Gen by PCR, Nasal     Status: None   Collection Time: 09/30/23 12:01 AM   Specimen:  Nasal Mucosa; Nasal Swab  Result Value Ref Range Status   MRSA by PCR Next Gen NOT DETECTED NOT DETECTED Final    Comment: (NOTE) The GeneXpert MRSA Assay (FDA approved for NASAL specimens only), is one component of a comprehensive MRSA colonization surveillance program. It is not intended to diagnose MRSA infection nor to guide or monitor treatment for MRSA infections. Test performance is not FDA approved in patients less than 54 years old. Performed at [facility], [address] [place], [place] [address]   Resp panel by RT-PCR (RSV, Flu A&B, Covid) Anterior Nasal Swab     Status: None   Collection Time: 09/30/23  5:30 AM   Specimen: Anterior Nasal Swab  Result Value Ref Range Status   SARS Coronavirus 2 by RT PCR NEGATIVE NEGATIVE Final    Comment: (NOTE) SARS-CoV-2 target nucleic acids are NOT DETECTED.  The SARS-CoV-2 RNA is generally detectable in upper respiratory specimens during the acute phase of infection. The lowest concentration of SARS-CoV-2 viral copies this assay can detect is 138 copies/mL. A negative result does not preclude SARS-Cov-2 infection and should not be used as the sole basis for treatment or other patient management decisions. A negative result may occur with  improper specimen collection/handling, submission of specimen other than nasopharyngeal swab, presence of viral mutation(s) within the areas targeted by this assay, and inadequate number of viral copies(<138 copies/mL). A negative result must be combined with clinical observations, patient history, and  epidemiological information. The expected result is Negative.  Fact Sheet for Patients:  BloggerCourse.com  Fact Sheet for Healthcare Providers:  SeriousBroker.it  This test is no t yet approved or cleared by the [place] FDA and  has been authorized for detection and/or diagnosis of SARS-CoV-2 by FDA under an  Emergency Use Authorization (EUA). This EUA will remain  in effect (meaning this test can be used) for the duration of the COVID-19 declaration under Section 564(b)(1) of the Act, 21 U.S.C.section 360bbb-3(b)(1), unless the authorization is terminated  or revoked sooner.       Influenza A by PCR NEGATIVE NEGATIVE Final   Influenza B by PCR NEGATIVE NEGATIVE Final    Comment: (NOTE) The Xpert Xpress SARS-CoV-2/FLU/RSV plus assay is intended as an aid in the diagnosis of influenza from Nasopharyngeal swab specimens and should not be used as a sole basis for treatment. Nasal washings and aspirates are unacceptable for Xpert Xpress SARS-CoV-2/FLU/RSV testing.  Fact Sheet for Patients: BloggerCourse.com  Fact Sheet for Healthcare Providers: SeriousBroker.it  This test is not yet approved or cleared by the [place] FDA and has been authorized for detection and/or diagnosis of SARS-CoV-2 by FDA under an Emergency Use Authorization (EUA). This EUA will remain in effect (meaning this test can be used) for the duration of the COVID-19 declaration under Section 564(b)(1) of the Act, 21 U.S.C. section 360bbb-3(b)(1), unless the authorization is terminated or revoked.     Resp Syncytial Virus by PCR NEGATIVE NEGATIVE Final    Comment: (NOTE) Fact Sheet for Patients: BloggerCourse.com  Fact Sheet for Healthcare Providers: SeriousBroker.it  This test is not yet approved or cleared by the [place] FDA and has been authorized for detection and/or diagnosis of SARS-CoV-2 by FDA under an Emergency Use Authorization (EUA). This EUA will remain in effect (meaning this test can be used) for the duration of the COVID-19 declaration under Section 564(b)(1) of the Act, 21 U.S.C. section 360bbb-3(b)(1), unless the authorization is terminated or revoked.  Performed at [facility], [address] [place], [place] [address]   Blood culture (routine x 2)     Status: None (Preliminary result)   Collection Time: 09/30/23  6:40 AM   Specimen: BLOOD  Result Value Ref Range Status   Specimen Description   Final    BLOOD RIGHT ANTECUBITAL Performed at [facility], [address], [place], [place] [address]    Special Requests   Final    BOTTLES DRAWN AEROBIC AND ANAEROBIC Blood Culture results may not be optimal due to an inadequate volume of blood received in culture bottles Performed at [facility], [address], [place], [place] [address]    Culture   Final    NO GROWTH 2 DAYS Performed at [facility], [address] [place], [place] [address]    Report Status PENDING  Incomplete  Blood culture (routine x 2)     Status: Abnormal (Preliminary result)   Collection Time: 09/30/23  6:45 AM   Specimen: BLOOD LEFT FOREARM  Result Value Ref Range Status   Specimen Description   Final    BLOOD LEFT FOREARM Performed at [facility], [address] [place], [place] [address]    Special Requests   Final    BOTTLES DRAWN AEROBIC AND ANAEROBIC Blood Culture results may not be optimal due to an inadequate volume of blood received in culture bottles Performed at [facility], [address], [place], [place] [address]    Culture  Setup Time   Final    GRAM POSITIVE COCCI IN BOTH AEROBIC AND ANAEROBIC BOTTLES CRITICAL RESULT CALLED TO, READ BACK BY AND VERIFIED WITH: PHARMD M.BELL AT 0823 ON 10/01/2023 BY T.SAAD.    Culture (A)  Final    STAPHYLOCOCCUS EPIDERMIDIS THE SIGNIFICANCE OF ISOLATING THIS ORGANISM FROM A SINGLE SET OF BLOOD CULTURES WHEN MULTIPLE SETS ARE DRAWN IS UNCERTAIN. PLEASE NOTIFY THE MICROBIOLOGY DEPARTMENT WITHIN ONE WEEK IF SPECIATION AND SENSITIVITIES ARE REQUIRED. Performed at [facility], [address] [place], [place] [address]    Report Status PENDING   Incomplete  Blood Culture ID Panel (Reflexed)     Status: Abnormal   Collection Time: 09/30/23  6:45 AM  Result Value Ref Range Status   Enterococcus faecalis NOT DETECTED NOT DETECTED Final   Enterococcus Faecium NOT DETECTED NOT DETECTED Final   Listeria monocytogenes NOT DETECTED NOT DETECTED Final   Staphylococcus species DETECTED (A) NOT DETECTED Final    Comment: CRITICAL RESULT CALLED TO, READ BACK BY AND VERIFIED WITH: PHARMD M.BELL AT 0823 ON 10/01/2023 BY T.SAAD.    Staphylococcus aureus (BCID) NOT DETECTED NOT DETECTED Final   Staphylococcus epidermidis DETECTED (A) NOT DETECTED Final    Comment: CRITICAL RESULT CALLED TO, READ BACK BY AND VERIFIED WITH: PHARMD M.BELL AT 0823 ON 10/01/2023 BY T.SAAD.    Staphylococcus lugdunensis NOT DETECTED NOT DETECTED Final   Streptococcus species NOT DETECTED NOT DETECTED Final   Streptococcus agalactiae NOT DETECTED NOT DETECTED Final   Streptococcus pneumoniae NOT DETECTED NOT DETECTED Final   Streptococcus pyogenes NOT DETECTED NOT DETECTED Final   A.calcoaceticus-baumannii NOT DETECTED NOT DETECTED Final   Bacteroides fragilis NOT DETECTED NOT DETECTED Final   Enterobacterales NOT DETECTED NOT DETECTED Final   Enterobacter cloacae complex NOT DETECTED NOT DETECTED Final   Escherichia coli NOT DETECTED NOT DETECTED Final   Klebsiella aerogenes NOT DETECTED NOT DETECTED Final   Klebsiella oxytoca NOT DETECTED NOT DETECTED Final   Klebsiella pneumoniae NOT DETECTED NOT DETECTED Final   Proteus species NOT DETECTED NOT DETECTED Final   Salmonella species NOT DETECTED NOT DETECTED Final   Serratia marcescens NOT DETECTED NOT DETECTED Final   Haemophilus influenzae NOT DETECTED NOT DETECTED Final   Neisseria meningitidis NOT DETECTED NOT DETECTED Final   Pseudomonas aeruginosa NOT DETECTED NOT DETECTED Final   Stenotrophomonas maltophilia NOT DETECTED NOT DETECTED Final   Candida albicans NOT DETECTED NOT DETECTED Final   Candida  auris NOT DETECTED NOT DETECTED Final   Candida glabrata NOT DETECTED NOT DETECTED Final   Candida krusei NOT DETECTED NOT DETECTED Final   Candida parapsilosis NOT DETECTED NOT DETECTED Final   Candida tropicalis NOT DETECTED NOT DETECTED Final   Cryptococcus neoformans/gattii NOT DETECTED NOT DETECTED Final   Methicillin resistance mecA/C NOT DETECTED NOT DETECTED Final    Comment: Performed at [facility], [address] [place], [place] [address]  Remove and replace urinary cath (placed > 5 days) then obtain urine culture from new indwelling urinary catheter.     Status: Abnormal   Collection Time: 09/30/23 12:43 PM   Specimen: Urine, Catheterized  Result Value Ref Range Status   Specimen Description   Final    URINE, CATHETERIZED Performed at [facility], [address] [place], [place] [address]    Special Requests   Final    NONE Performed at [facility], [address]  [place], [place] [address]    Culture (A)  Final    <10,000 COLONIES/mL INSIGNIFICANT GROWTH Performed at [facility], [address], [place], [place] [address]    Report Status 10/01/2023 FINAL  Final     Labs: BNP (last 3 results) Recent Labs    06/11/23 1623  BNP 65.4   Basic Metabolic Panel: Recent Labs  Lab 09/26/23 0552 09/30/23 0529 09/30/23 1758 10/01/23 0300 10/02/23 0351  NA 130* 127* 131* 133* 135  K 3.7 2.4* 4.1 3.6 3.3*  CL 97* 96* 104 102 104  CO2 23 20* 19* 22 22  GLUCOSE 153* 98 73 187* 138*  BUN 15 30* 22 18 13   CREATININE 1.68* 2.08* 1.57* 1.54* 1.49*  CALCIUM 8.7* 8.0* 7.5* 7.8* 8.0*  MG  --  2.1  --   --   --    Liver Function Tests: Recent Labs  Lab 09/30/23 0529  AST 17  ALT 17  ALKPHOS 57  BILITOT 0.5  PROT 6.4*  ALBUMIN 2.7*   No results for input(s): "LIPASE", "AMYLASE" in the last 168 hours. Recent Labs  Lab 09/30/23 0529  AMMONIA 10   CBC: Recent Labs  Lab 09/30/23 0529 10/01/23 0300   WBC 15.0* 8.4  NEUTROABS 13.4*  --   HGB 10.8* 10.7*  HCT 33.8* 34.3*  MCV 78.6* 83.1  PLT 281 244   Cardiac Enzymes: No results for input(s): "CKTOTAL", "CKMB", "CKMBINDEX", "TROPONINI" in the last 168 hours. BNP: Invalid input(s): "POCBNP" CBG: Recent Labs  Lab 10/02/23 0343 10/02/23 0637 10/02/23 0738 10/02/23 0954 10/02/23 1209  GLUCAP 149* 134* 133* 109* 130*   D-Dimer No results for input(s): "DDIMER" in the last 72 hours. Hgb A1c No results for input(s): "HGBA1C" in the last 72 hours. Lipid Profile No results for input(s): "CHOL", "HDL", "LDLCALC", "TRIG", "CHOLHDL", "LDLDIRECT" in the last 72 hours. Thyroid function studies Recent Labs    09/30/23 0605  TSH 0.852   Anemia work up No results for input(s): "VITAMINB12", "FOLATE", "FERRITIN", "TIBC", "IRON", "RETICCTPCT" in the last 72 hours. Urinalysis    Component Value Date/Time   COLORURINE YELLOW 09/30/2023 0530   APPEARANCEUR CLOUDY (A) 09/30/2023 0530   LABSPEC 1.006 09/30/2023 0530   PHURINE 6.0 09/30/2023 0530   GLUCOSEU NEGATIVE 09/30/2023 0530   HGBUR MODERATE (A) 09/30/2023 0530   BILIRUBINUR NEGATIVE 09/30/2023 0530   KETONESUR NEGATIVE 09/30/2023 0530   PROTEINUR 30 (A) 09/30/2023 0530   UROBILINOGEN 0.2 04/14/2015 1447   NITRITE NEGATIVE 09/30/2023 0530   LEUKOCYTESUR LARGE (A) 09/30/2023 0530   Sepsis Labs Recent Labs  Lab 09/30/23 0529 10/01/23 0300  WBC 15.0* 8.4   Microbiology Recent Results (from the past 240 hours)  MRSA Next Gen by PCR, Nasal     Status: None   Collection Time: 09/30/23 12:01 AM   Specimen: Nasal Mucosa; Nasal Swab  Result Value Ref Range Status   MRSA by PCR Next Gen NOT DETECTED NOT DETECTED Final    Comment: (NOTE) The GeneXpert MRSA Assay (FDA approved for NASAL specimens only), is one component of a comprehensive MRSA colonization surveillance program. It is not intended to diagnose MRSA infection nor to guide or monitor treatment for MRSA  infections. Test performance is not FDA approved in patients less than 31 years old. Performed at [facility], [address] [place], [place] [address]   Resp panel by RT-PCR (RSV, Flu A&B, Covid) Anterior Nasal Swab     Status: None   Collection  Time: 09/30/23  5:30 AM   Specimen: Anterior Nasal Swab  Result Value Ref Range Status   SARS Coronavirus 2 by RT PCR NEGATIVE NEGATIVE Final    Comment: (NOTE) SARS-CoV-2 target nucleic acids are NOT DETECTED.  The SARS-CoV-2 RNA is generally detectable in upper respiratory specimens during the acute phase of infection. The lowest concentration of SARS-CoV-2 viral copies this assay can detect is 138 copies/mL. A negative result does not preclude SARS-Cov-2 infection and should not be used as the sole basis for treatment or other patient management decisions. A negative result may occur with  improper specimen collection/handling, submission of specimen other than nasopharyngeal swab, presence of viral mutation(s) within the areas targeted by this assay, and inadequate number of viral copies(<138 copies/mL). A negative result must be combined with clinical observations, patient history, and epidemiological information. The expected result is Negative.  Fact Sheet for Patients:  BloggerCourse.com  Fact Sheet for Healthcare Providers:  SeriousBroker.it  This test is no t yet approved or cleared by the [place] FDA and  has been authorized for detection and/or diagnosis of SARS-CoV-2 by FDA under an Emergency Use Authorization (EUA). This EUA will remain  in effect (meaning this test can be used) for the duration of the COVID-19 declaration under Section 564(b)(1) of the Act, 21 U.S.C.section 360bbb-3(b)(1), unless the authorization is terminated  or revoked sooner.       Influenza A by PCR NEGATIVE NEGATIVE Final   Influenza B by PCR NEGATIVE NEGATIVE Final     Comment: (NOTE) The Xpert Xpress SARS-CoV-2/FLU/RSV plus assay is intended as an aid in the diagnosis of influenza from Nasopharyngeal swab specimens and should not be used as a sole basis for treatment. Nasal washings and aspirates are unacceptable for Xpert Xpress SARS-CoV-2/FLU/RSV testing.  Fact Sheet for Patients: BloggerCourse.com  Fact Sheet for Healthcare Providers: SeriousBroker.it  This test is not yet approved or cleared by the [place] FDA and has been authorized for detection and/or diagnosis of SARS-CoV-2 by FDA under an Emergency Use Authorization (EUA). This EUA will remain in effect (meaning this test can be used) for the duration of the COVID-19 declaration under Section 564(b)(1) of the Act, 21 U.S.C. section 360bbb-3(b)(1), unless the authorization is terminated or revoked.     Resp Syncytial Virus by PCR NEGATIVE NEGATIVE Final    Comment: (NOTE) Fact Sheet for Patients: BloggerCourse.com  Fact Sheet for Healthcare Providers: SeriousBroker.it  This test is not yet approved or cleared by the [place] FDA and has been authorized for detection and/or diagnosis of SARS-CoV-2 by FDA under an Emergency Use Authorization (EUA). This EUA will remain in effect (meaning this test can be used) for the duration of the COVID-19 declaration under Section 564(b)(1) of the Act, 21 U.S.C. section 360bbb-3(b)(1), unless the authorization is terminated or revoked.  Performed at [facility], [address] [place], [place] [address]   Blood culture (routine x 2)     Status: None (Preliminary result)   Collection Time: 09/30/23  6:40 AM   Specimen: BLOOD  Result Value Ref Range Status   Specimen Description   Final    BLOOD RIGHT ANTECUBITAL Performed at [facility], [address], [place], [place] [address]     Special Requests   Final    BOTTLES DRAWN AEROBIC AND ANAEROBIC Blood Culture results may not be optimal due to an inadequate volume of blood received in culture bottles Performed at [facility], [address], [place], [place] [address]    Culture   Final    NO GROWTH 2 DAYS Performed at [facility], [address], [place], [place] [address]    Report Status PENDING  Incomplete  Blood culture (routine x 2)     Status: Abnormal (Preliminary result)   Collection Time: 09/30/23  6:45 AM   Specimen: BLOOD LEFT FOREARM  Result Value Ref Range Status   Specimen Description   Final    BLOOD LEFT FOREARM Performed at [facility], [address], [place], [place] [address]    Special Requests   Final    BOTTLES DRAWN AEROBIC AND ANAEROBIC Blood Culture results may not be optimal due to an inadequate volume of blood received in culture bottles Performed at [facility], [address], [place], [place] [address]    Culture  Setup Time   Final    GRAM POSITIVE COCCI IN BOTH AEROBIC AND ANAEROBIC BOTTLES CRITICAL RESULT CALLED TO, READ BACK BY AND VERIFIED WITH: PHARMD M.BELL AT 0823 ON 10/01/2023 BY T.SAAD.    Culture (A)  Final    STAPHYLOCOCCUS EPIDERMIDIS THE SIGNIFICANCE OF ISOLATING THIS ORGANISM FROM A SINGLE SET OF BLOOD CULTURES WHEN MULTIPLE SETS ARE DRAWN IS UNCERTAIN. PLEASE NOTIFY THE MICROBIOLOGY DEPARTMENT WITHIN ONE WEEK IF SPECIATION AND SENSITIVITIES ARE REQUIRED. Performed at [facility], [address] [place], [place] [address]    Report Status PENDING  Incomplete  Blood Culture ID Panel (Reflexed)     Status: Abnormal   Collection Time: 09/30/23  6:45 AM  Result Value Ref Range Status   Enterococcus faecalis NOT DETECTED NOT DETECTED Final   Enterococcus Faecium NOT DETECTED NOT DETECTED Final   Listeria monocytogenes NOT DETECTED NOT DETECTED Final   Staphylococcus species DETECTED (A) NOT DETECTED Final     Comment: CRITICAL RESULT CALLED TO, READ BACK BY AND VERIFIED WITH: PHARMD M.BELL AT 0823 ON 10/01/2023 BY T.SAAD.    Staphylococcus aureus (BCID) NOT DETECTED NOT DETECTED Final   Staphylococcus epidermidis DETECTED (A) NOT DETECTED Final    Comment: CRITICAL RESULT CALLED TO, READ BACK BY AND VERIFIED WITH: PHARMD M.BELL AT 0823 ON 10/01/2023 BY T.SAAD.    Staphylococcus lugdunensis NOT DETECTED NOT DETECTED Final   Streptococcus species NOT DETECTED NOT DETECTED Final   Streptococcus agalactiae NOT DETECTED NOT DETECTED Final   Streptococcus pneumoniae NOT DETECTED NOT DETECTED Final   Streptococcus pyogenes NOT DETECTED NOT DETECTED Final   A.calcoaceticus-baumannii NOT DETECTED NOT DETECTED Final   Bacteroides fragilis NOT DETECTED NOT DETECTED Final   Enterobacterales NOT DETECTED NOT DETECTED Final   Enterobacter cloacae complex NOT DETECTED NOT DETECTED Final   Escherichia coli NOT DETECTED NOT DETECTED Final   Klebsiella aerogenes NOT DETECTED NOT DETECTED Final   Klebsiella oxytoca NOT DETECTED NOT DETECTED Final   Klebsiella pneumoniae NOT DETECTED NOT DETECTED Final   Proteus species NOT DETECTED NOT DETECTED Final   Salmonella species NOT DETECTED NOT DETECTED Final   Serratia marcescens NOT DETECTED NOT DETECTED Final   Haemophilus influenzae NOT DETECTED NOT DETECTED Final   Neisseria meningitidis NOT DETECTED NOT DETECTED Final   Pseudomonas aeruginosa NOT DETECTED NOT DETECTED Final   Stenotrophomonas maltophilia NOT DETECTED NOT DETECTED Final   Candida albicans NOT DETECTED NOT DETECTED Final   Candida auris NOT DETECTED NOT DETECTED Final   Candida glabrata NOT DETECTED NOT DETECTED Final   Candida krusei NOT DETECTED NOT DETECTED Final   Candida  parapsilosis NOT DETECTED NOT DETECTED Final   Candida tropicalis NOT DETECTED NOT DETECTED Final   Cryptococcus neoformans/gattii NOT DETECTED NOT DETECTED Final   Methicillin resistance mecA/C NOT DETECTED NOT  DETECTED Final    Comment: Performed at [facility], [address] [place], [place] [address]  Remove and replace urinary cath (placed > 5 days) then obtain urine culture from new indwelling urinary catheter.     Status: Abnormal   Collection Time: 09/30/23 12:43 PM   Specimen: Urine, Catheterized  Result Value Ref Range Status   Specimen Description   Final    URINE, CATHETERIZED Performed at [facility], [address], [place], [place] [address]    Special Requests   Final    NONE Performed at [facility], [address], [place], [place] [address]    Culture (A)  Final    <10,000 COLONIES/mL INSIGNIFICANT GROWTH Performed at [facility], [address], [place], [place] [address]    Report Status 10/01/2023 FINAL  Final     Time coordinating discharge: Over 30 minutes  SIGNED:   Azucena Fallen, DO [facility] 10/02/2023, 12:24 PM Pager   If 7PM-7AM, please contact night-coverage [email or web address]

## 2023-10-02 NOTE — Plan of Care (Signed)
  Problem: Education: Goal: Knowledge of General Education information will improve Description: Including pain rating scale, medication(s)/side effects and non-pharmacologic comfort measures Outcome: Progressing   Problem: Clinical Measurements: Goal: Ability to maintain clinical measurements within normal limits will improve Outcome: Progressing   Problem: Activity: Goal: Risk for activity intolerance will decrease Outcome: Progressing   Problem: Nutrition: Goal: Adequate nutrition will be maintained Outcome: Progressing   Problem: Coping: Goal: Level of anxiety will decrease Outcome: Progressing   Problem: Pain Managment: Goal: General experience of comfort will improve and/or be controlled Outcome: Progressing

## 2023-10-02 NOTE — Progress Notes (Addendum)
 Transition of Care [facility]) - Inpatient Brief Assessment   Patient Details  Name: Edwin [name] MRN: [id number] Date of Birth: January 10, 1956  Transition of Care [facility]) CM/SW Contact:    Larrie Kass, LCSW Phone Number: 10/02/2023, 12:35 PM  CSW spoke with the pt's brother, who is requesting assistance in the home for his brother. He stated he would like someone to check in on him and take his vitals, as he is diabetic and his sugar runs low. He mentioned that the patient does not have a preference for a home health agency. Home Health was requested  [facility] is abe to accept pt for [facility] and SW. No further TOC needs TOC sign off.  Transition of Care Asessment: Insurance and Status: Insurance coverage has been reviewed Patient has primary care physician: Yes Home environment has been reviewed: home wiht self Prior level of function:: indepednent Prior/Current Home Services: No current home services Social Drivers of Health Review: SDOH reviewed no interventions necessary Readmission risk has been reviewed: Yes Transition of care needs: no transition of care needs at this time

## 2023-10-03 LAB — CULTURE, BLOOD (ROUTINE X 2): Culture  Setup Time: NO GROWTH

## 2023-10-05 LAB — CULTURE, BLOOD (ROUTINE X 2): Culture: NO GROWTH

## 2023-12-19 IMAGING — CR DG CHEST 2V
2 series · 2 of 2 positions shown · non-contrast
Comparison: 08/10/2017 chest radiograph.

CLINICAL DATA: leg swelling, dyspnea

EXAM:
CHEST - 2 VIEW

[w chest lat]
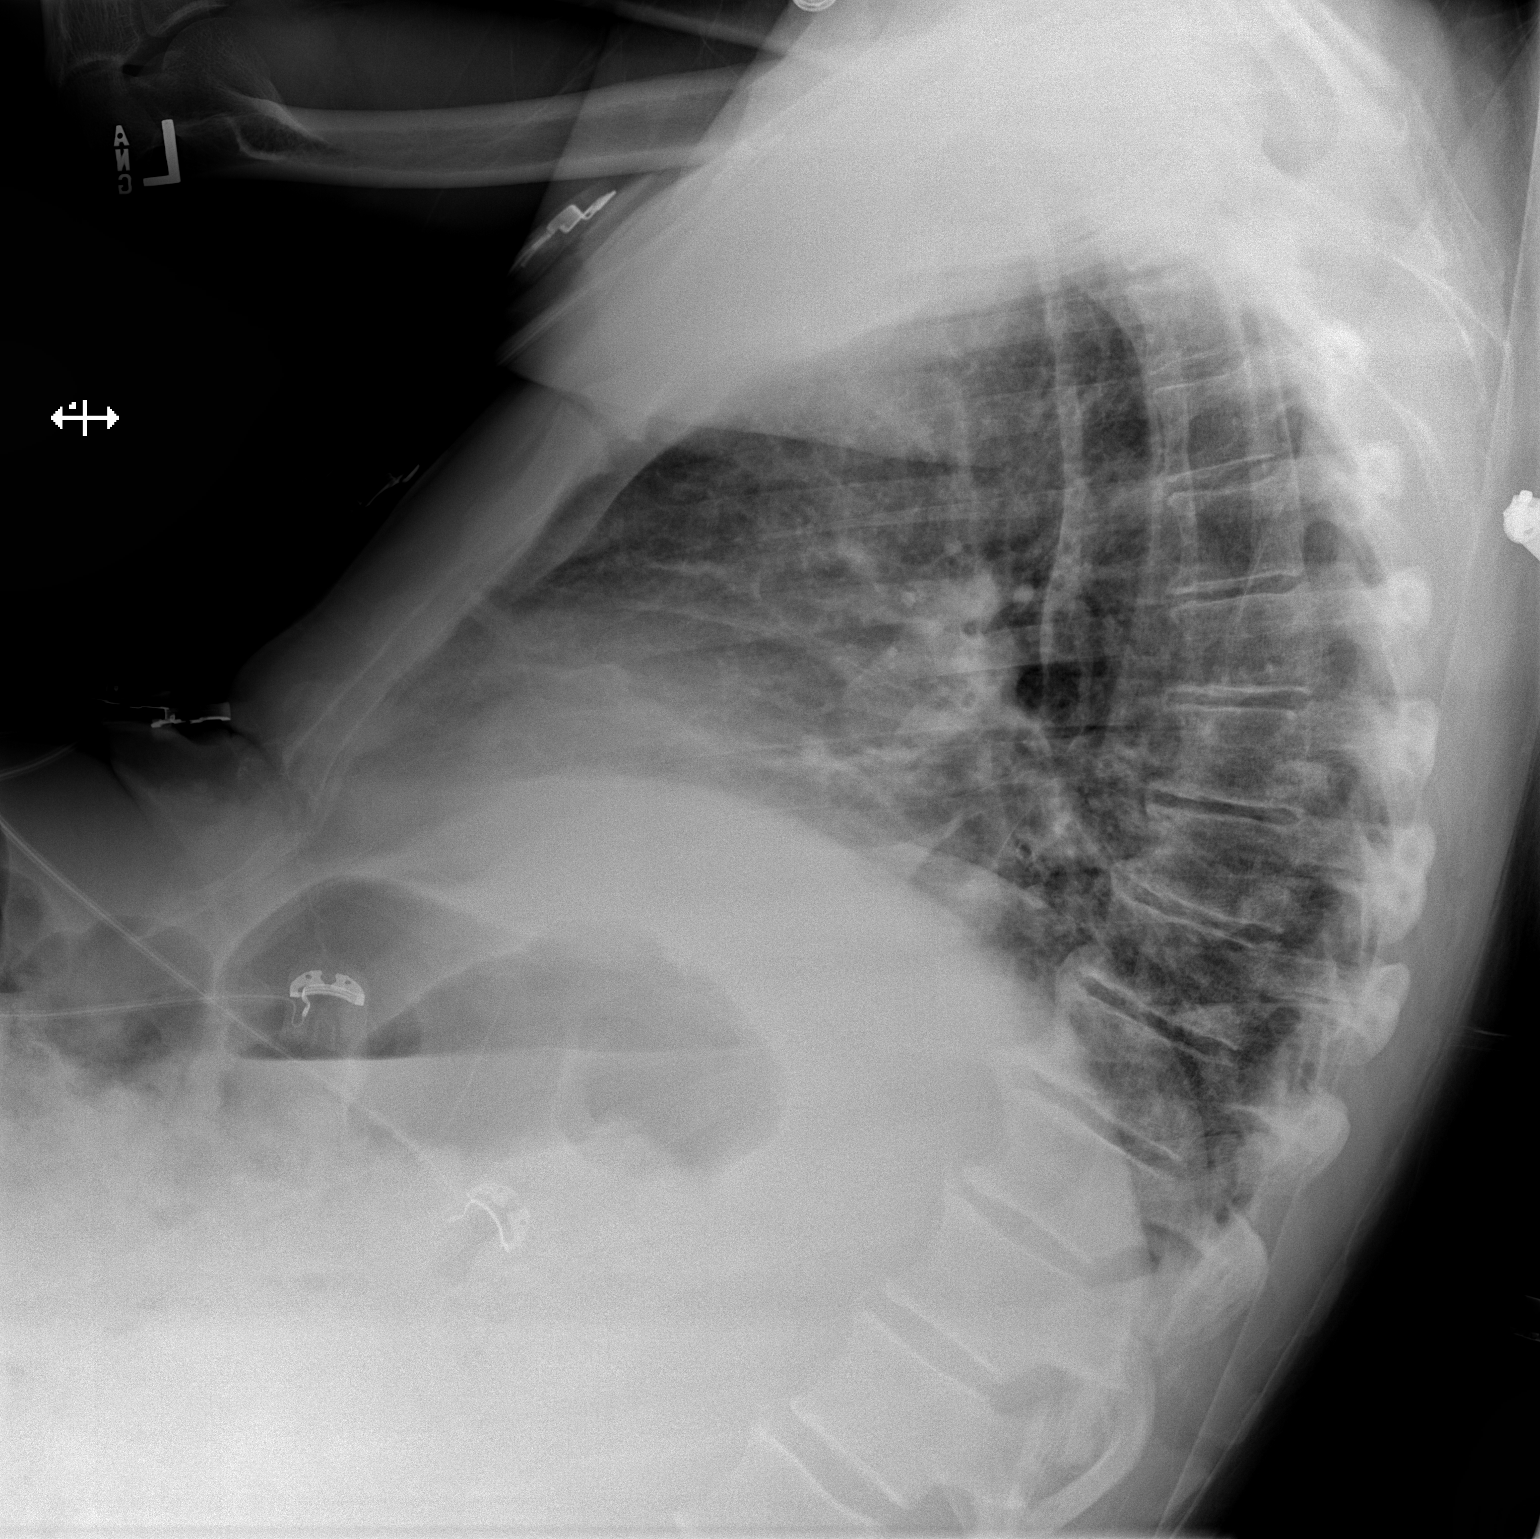

[x chest ap]
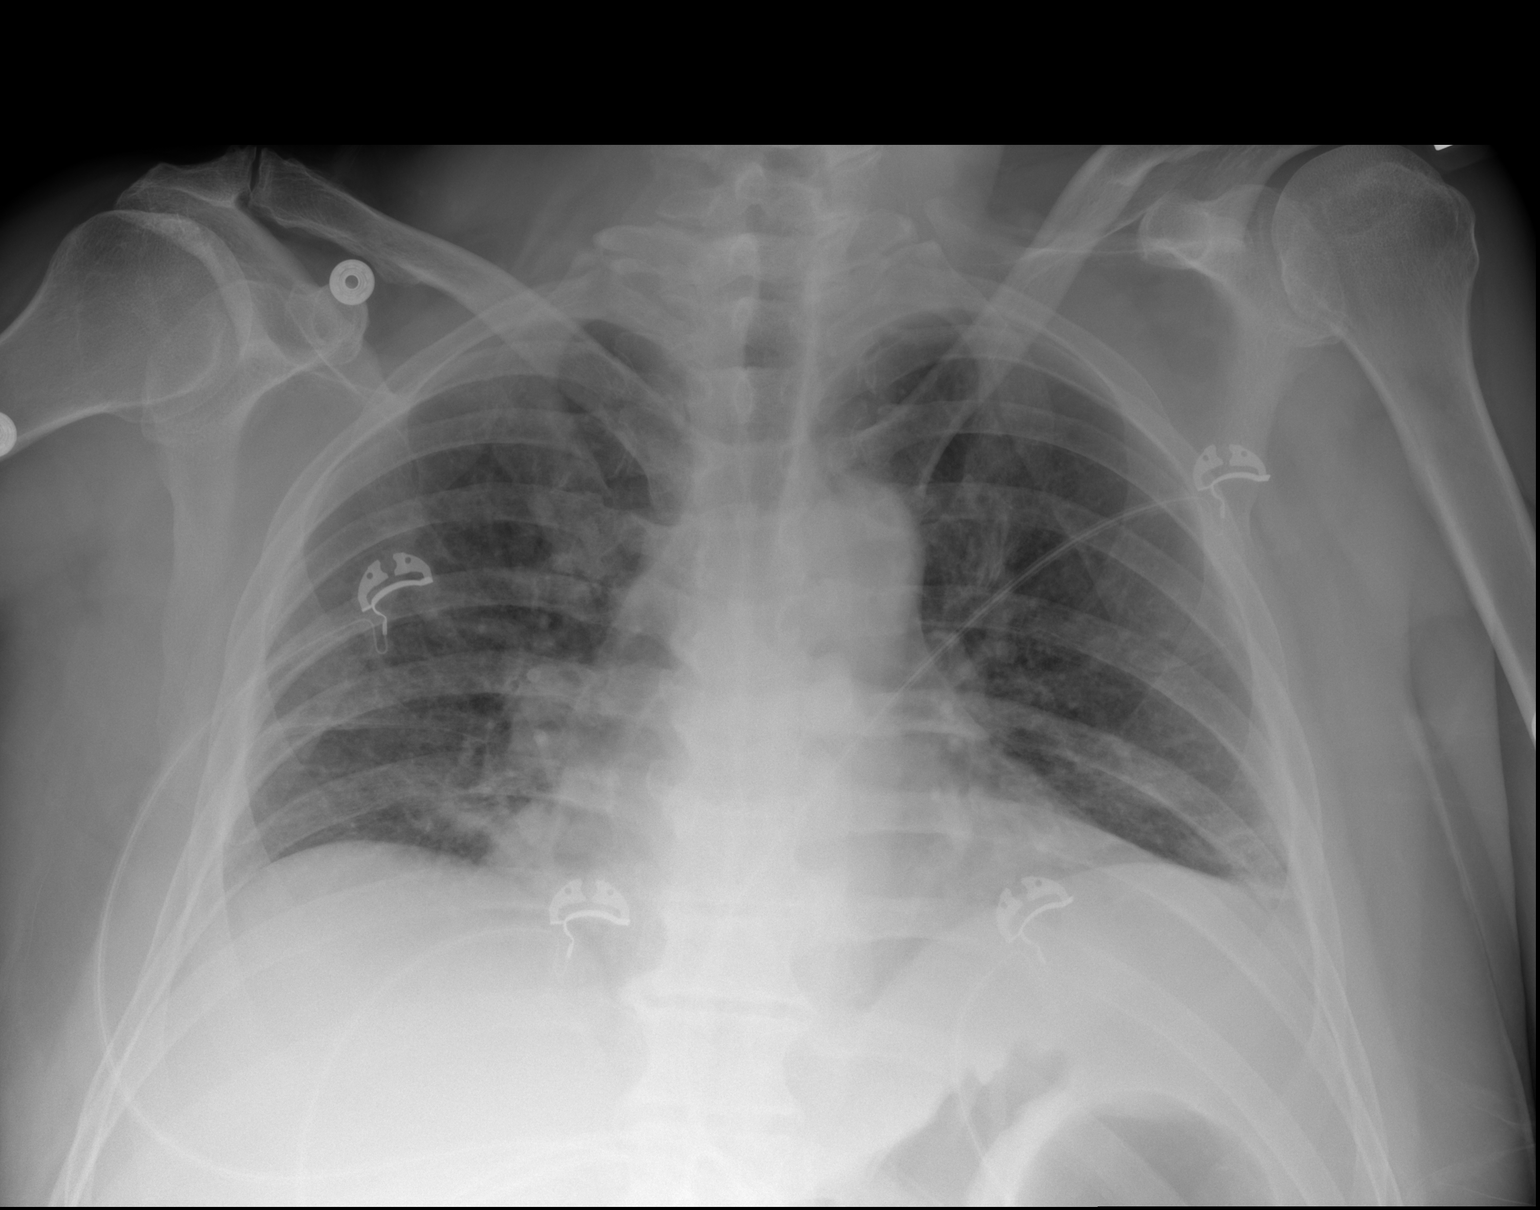

[2 of 2 positions shown; findings below may reference images not displayed]

FINDINGS: Surgical hardware from [facility]CD[facility] overlies the lower cervical spine.
Stable cardiomediastinal silhouette with normal heart size. No
pneumothorax. No pleural effusion. Low lung volumes with mild
streaky bibasilar lung opacities.
IMPRESSION: Low lung volumes with mild streaky bibasilar lung opacities, favor
atelectasis.

## 2023-12-31 IMAGING — CT CT ANGIO CHEST
3 of 7 series · 19 of 46 positions shown · IV contrast (APPLIED)
Comparison: None.

CLINICAL DATA: Pulmonary embolism (PE) suspected, high prob

EXAM:
CT ANGIOGRAPHY CHEST WITH CONTRAST
TECHNIQUE: Multidetector CT imaging of the chest was performed using the
standard protocol during bolus administration of intravenous
contrast. Multiplanar CT image reconstructions and MIPs were
obtained to evaluate the vascular anatomy.

[Series 5: thins · axial · 0.82mm/px · z∈[-292,+2]mm · 15 of 336 slices shown]
[im 21/336  lung]
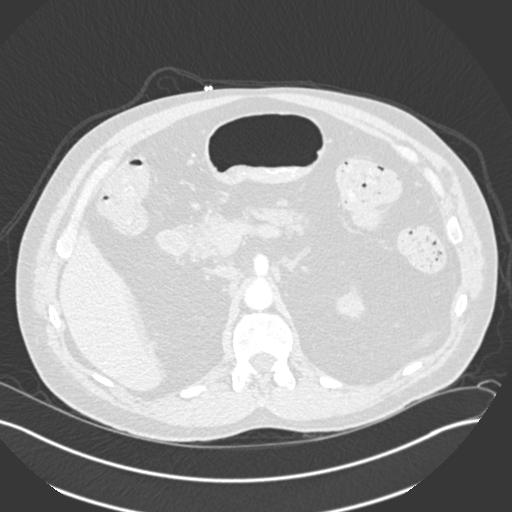
[im 42/336  soft-tissue]
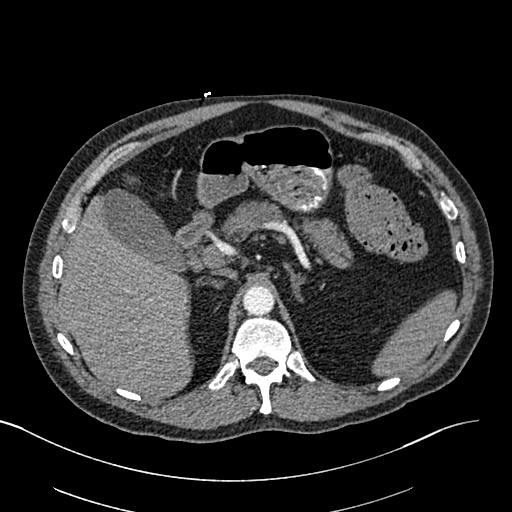
[im 63/336  lung]
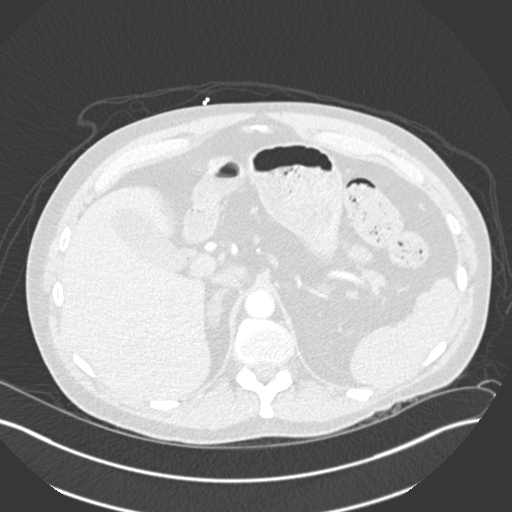
[im 84/336  soft-tissue]
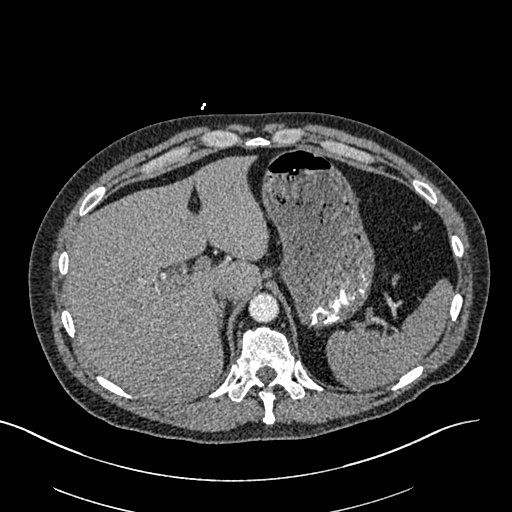
[im 105/336  lung]
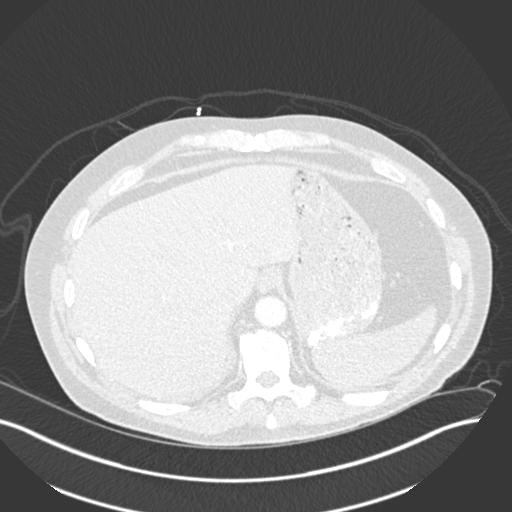
[im 126/336  soft-tissue]
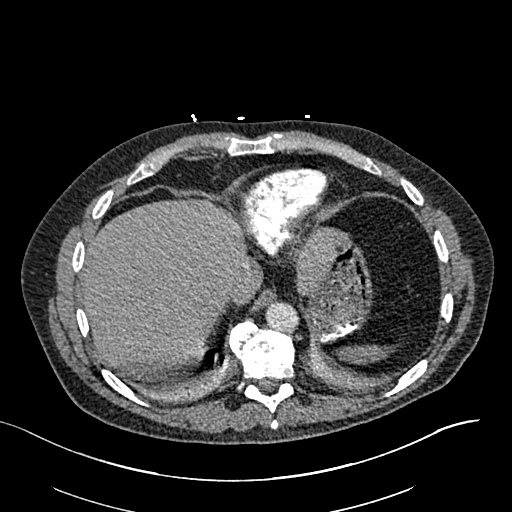
[im 147/336  lung]
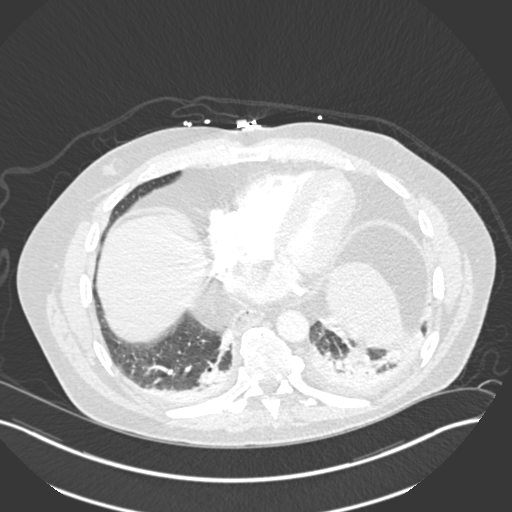
[im 168/336  soft-tissue]
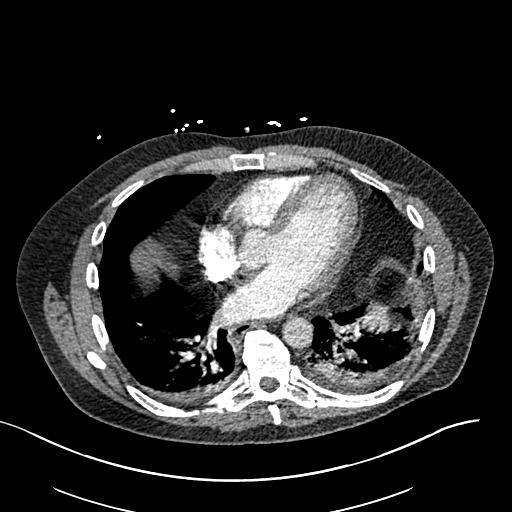
[im 189/336  lung]
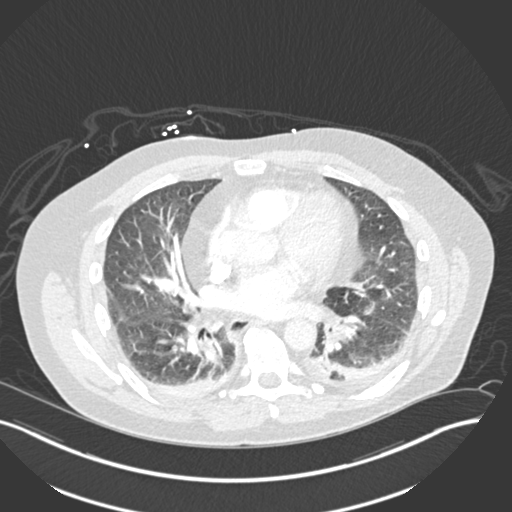
[im 210/336  soft-tissue]
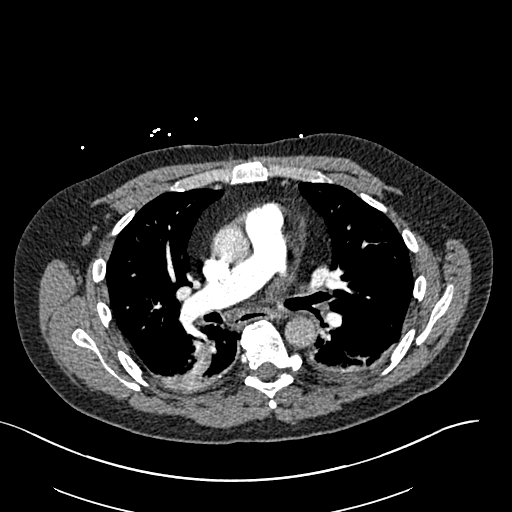
[im 231/336  lung]
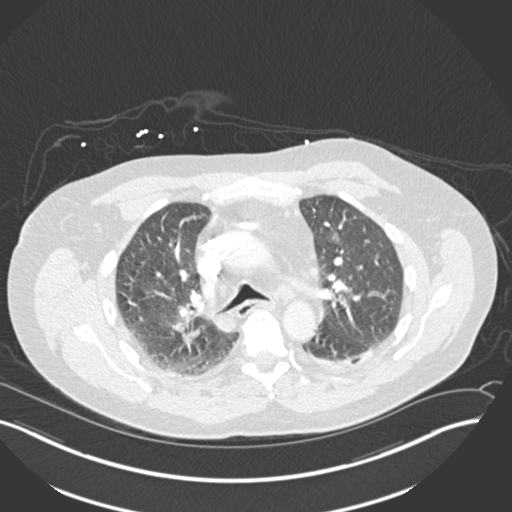
[im 252/336  soft-tissue]
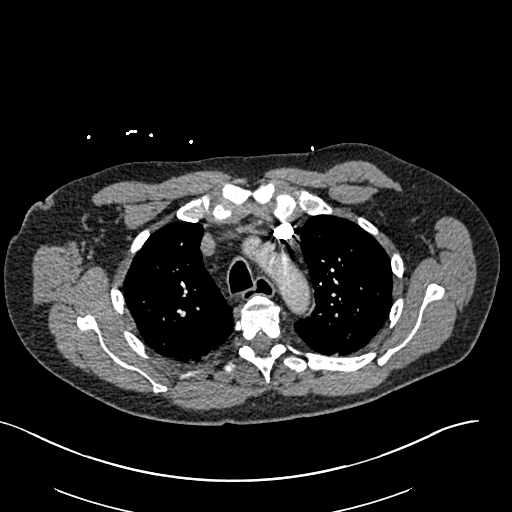
[im 273/336  lung]
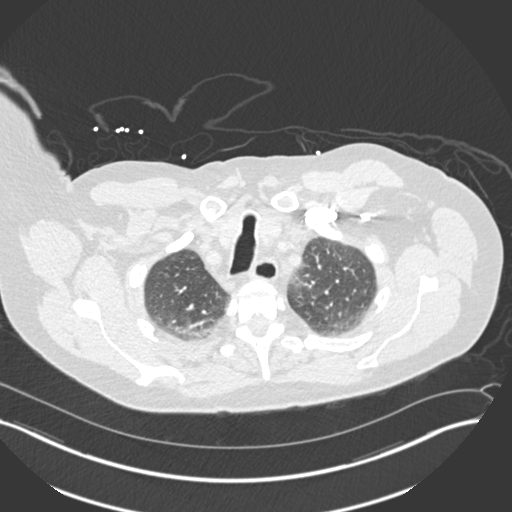
[im 294/336  soft-tissue]
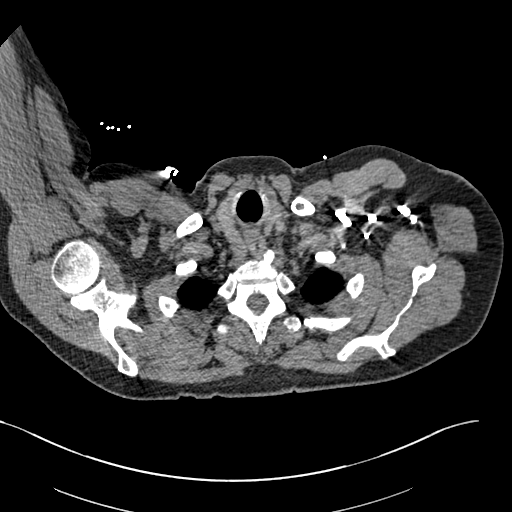
[im 315/336  lung]
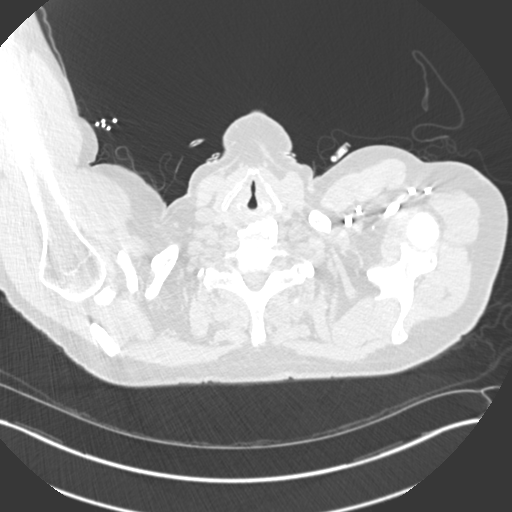

[Series 6: lung · axial · 0.82mm/px · z∈[-271,-229]mm · 2 of 168 slices shown]
[im 21/168  soft-tissue]
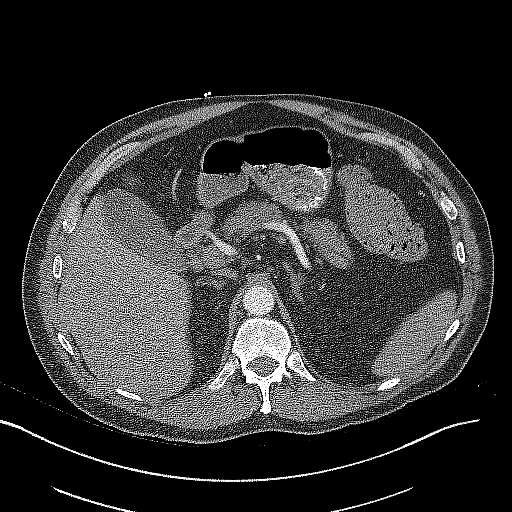
[im 42/168  soft-tissue]
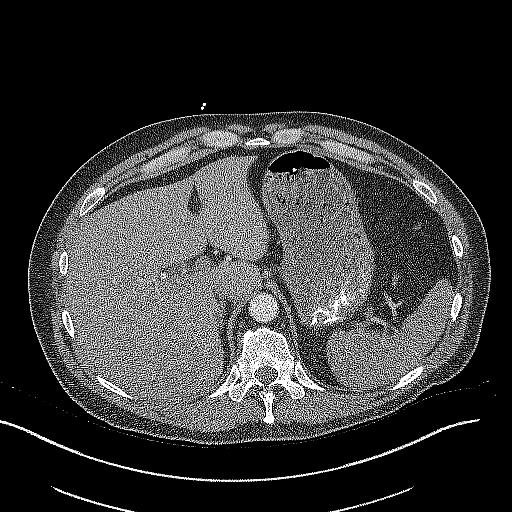

[Series 7: coronal mpr · coronal · 0.66mm/px · 2 of 95 slices shown]
[im 32/95  soft-tissue]
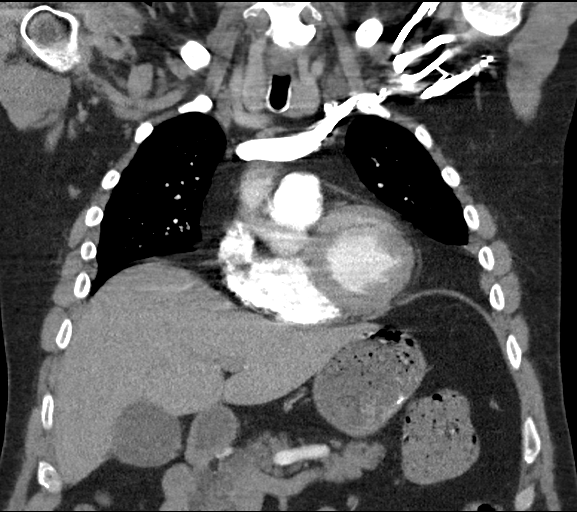
[im 63/95  soft-tissue]
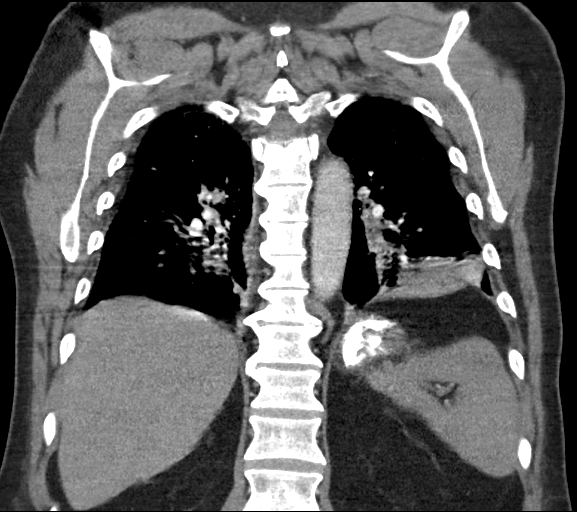

[19 of 46 positions shown; findings below may reference images not displayed]

RADIATION DOSE REDUCTION: This exam was performed according to the
departmental dose-optimization program which includes automated
exposure control, adjustment of the mA and/or kV according to
patient size and/or use of iterative reconstruction technique.

CONTRAST:  80mL OMNIPAQUE IOHEXOL 350 MG/ML SOLN
FINDINGS: Cardiovascular: Satisfactory opacification of the pulmonary arteries
to the proximal segmental level. No evidence of pulmonary embolism.
Normal cardiac size.No pericardial disease. Punctate atherosclerotic
calcification in the aortic arch.

Mediastinum/Nodes: No lymphadenopathy. The thyroid is unremarkable.
Esophagus is unremarkable.

Lungs/Pleura: There are small bilateral pleural effusions with
adjacent bibasilar atelectasis, left greater than right. No
suspicious pulmonary nodules. No pneumothorax.

Upper Abdomen: No acute abnormality.

Musculoskeletal: No chest wall abnormality. No acute or significant
osseous findings.

Review of the MIP images confirms the above findings.
IMPRESSION: Small bilateral pleural effusions with left greater than right
basilar atelectasis. No evidence of pulmonary embolism.

## 2024-01-15 ENCOUNTER — Emergency Department (HOSPITAL_COMMUNITY)

## 2024-01-15 ENCOUNTER — Emergency Department (HOSPITAL_COMMUNITY)
Admission: EM | Admit: 2024-01-15 | Discharge: 2024-01-16 | Disposition: A | Discharge status: Home / Self Care | Attending: Emergency Medicine | Admitting: Emergency Medicine

## 2024-01-15 ENCOUNTER — Encounter (HOSPITAL_COMMUNITY): Payer: Self-pay | Admitting: Emergency Medicine

## 2024-01-15 DIAGNOSIS — Z720 Tobacco use: Secondary | ICD-10-CM | POA: Diagnosis not present

## 2024-01-15 DIAGNOSIS — Z8546 Personal history of malignant neoplasm of prostate: Secondary | ICD-10-CM | POA: Insufficient documentation

## 2024-01-15 DIAGNOSIS — Z7982 Long term (current) use of aspirin: Secondary | ICD-10-CM | POA: Diagnosis not present

## 2024-01-15 DIAGNOSIS — R339 Retention of urine, unspecified: Secondary | ICD-10-CM | POA: Diagnosis not present

## 2024-01-15 DIAGNOSIS — R079 Chest pain, unspecified: Secondary | ICD-10-CM | POA: Diagnosis present

## 2024-01-15 DIAGNOSIS — E119 Type 2 diabetes mellitus without complications: Secondary | ICD-10-CM | POA: Insufficient documentation

## 2024-01-15 LAB — CBC
HCT: 40.5 % (ref 39.0–52.0)
Hemoglobin: 13 g/dL (ref 13.0–17.0)
MCH: 26.6 pg (ref 26.0–34.0)
MCHC: 32.1 g/dL (ref 30.0–36.0)
MCV: 83 fL (ref 80.0–100.0)
Platelets: 359 10*3/uL (ref 150–400)
RBC: 4.88 MIL/uL (ref 4.22–5.81)
RDW: 14.4 % (ref 11.5–15.5)
WBC: 8.1 10*3/uL (ref 4.0–10.5)
nRBC: 0 % (ref 0.0–0.2)

## 2024-01-15 LAB — BASIC METABOLIC PANEL WITH GFR
Anion gap: 12 (ref 5–15)
BUN: 22 mg/dL (ref 8–23)
CO2: 19 mmol/L — ABNORMAL LOW (ref 22–32)
Calcium: 8.7 mg/dL — ABNORMAL LOW (ref 8.9–10.3)
Chloride: 104 mmol/L (ref 98–111)
Creatinine, Ser: 1.95 mg/dL — ABNORMAL HIGH (ref 0.61–1.24)
GFR, Estimated: 37 mL/min — ABNORMAL LOW (ref >60)
Glucose, Bld: 107 mg/dL — ABNORMAL HIGH (ref 70–99)
Potassium: 4 mmol/L (ref 3.5–5.1)
Sodium: 135 mmol/L (ref 135–145)

## 2024-01-15 LAB — TROPONIN I (HIGH SENSITIVITY)
Troponin I (High Sensitivity): <2 ng/L (ref <18)
Troponin I (High Sensitivity): 3 ng/L (ref <18)

## 2024-01-15 NOTE — ED Provider Notes (Signed)
 [facility] AT [facility] Provider Note   CSN: [id number] Arrival date & time: 01/15/24  1206     Patient presents with: Chest Pain   Edwin [name] is a 68 y.o. male.  He is presenting by ambulance with complaint of chest pain.  He said it started at rest and lasted about an hour and has been resolved since then.  No prior history of same.  He does endorse that he has diabetes and prostate cancer, has a catheter.  He is a smoker denies any cocaine use.  {Add pertinent medical, surgical, social history, OB history to WUJ:81191} The history is provided by the patient.  Chest Pain Pain location:  Substernal area Pain quality: aching   Pain radiates to:  Does not radiate Pain severity:  Moderate Onset quality:  Gradual Duration:  1 hour Timing:  Constant Progression:  Resolved Chronicity:  New Context: at rest   Relieved by:  None tried Worsened by:  Nothing Ineffective treatments:  None tried Associated symptoms: cough   Associated symptoms: no abdominal pain, no fever, no nausea, no shortness of breath and no vomiting   Risk factors: smoking        Prior to Admission medications   Medication Sig Start Date End Date Taking? Authorizing Provider  acetaminophen  (TYLENOL ) 325 MG tablet Take 650 mg by mouth every 6 (six) hours as needed for moderate pain.    [provider]  aspirin  EC 81 MG tablet Take 1 tablet (81 mg total) by mouth daily. 10/02/23   Haydee Lipa, MD  bethanechol  (URECHOLINE ) 5 MG tablet TAKE 1 TABLET (5 MG TOTAL) BY MOUTH IN THE MORNING AND AT BEDTIME 06/04/23   McKenzie, Arden Beck, MD  CAPLYTA  42 MG capsule Take 42 mg by mouth every morning. 08/20/23   [provider]  EX-LAX 15 MG CHEW Chew 15-30 mg by mouth 2 (two) times daily as needed (for constipation).    [provider]  furosemide  (LASIX ) 20 MG tablet Take 20 mg by mouth daily as needed for fluid or edema. 05/11/22   [provider]  Ipratropium-Albuterol  (COMBIVENT  RESPIMAT) 20-100 MCG/ACT AERS respimat INHALE 1 PUFF INTO THE LUNGS EVERY 6 HOURS AS NEEDED FOR WHEEZING OR SHORTNESS OF BREATH. Patient taking differently: Inhale 1 puff into the lungs every 6 (six) hours as needed for wheezing or shortness of breath. 06/04/22   Sood, Vineet, MD  polyethylene glycol powder (GLYCOLAX /MIRALAX ) 17 GM/SCOOP powder Take 17 g by mouth daily as needed for mild constipation.    [provider]  temazepam  (RESTORIL ) 30 MG capsule Take 30 mg by mouth at bedtime. 09/21/23   [provider]    Allergies: Patient has no known allergies.    Review of Systems  Constitutional:  Negative for fever.  Respiratory:  Positive for cough. Negative for shortness of breath.   Cardiovascular:  Positive for chest pain.  Gastrointestinal:  Negative for abdominal pain, nausea and vomiting.    Updated Vital Signs BP 111/74   Pulse 83   Temp 98 F (36.7 C)   Resp 16   Ht 5' 11 (1.803 m)   Wt 89.4 kg   SpO2 97%   BMI 27.48 kg/m   Physical Exam Vitals and nursing note reviewed.  Constitutional:      General: He is not in acute distress.    Appearance: He is well-developed.  HENT:     Head: Normocephalic and atraumatic.   Eyes:  Conjunctiva/sclera: Conjunctivae normal.    Cardiovascular:     Rate and Rhythm: Normal rate and regular rhythm.     Heart sounds: Normal heart sounds. No murmur heard. Pulmonary:     Effort: Pulmonary effort is normal. No respiratory distress.     Breath sounds: Normal breath sounds.  Abdominal:     Palpations: Abdomen is soft.     Tenderness: There is no abdominal tenderness.   Musculoskeletal:        General: No swelling.     Cervical back: Neck supple.     Right lower leg: No tenderness. Edema present.     Left lower leg: No tenderness. Edema present.   Skin:    General: Skin is warm and dry.     Capillary Refill: Capillary refill takes less than 2 seconds.    Neurological:     General: No focal deficit present.     Mental Status: He is alert.     (all labs ordered are listed, but only abnormal results are displayed) Labs Reviewed  BASIC METABOLIC PANEL WITH GFR - Abnormal; Notable for the following components:      Result Value   CO2 19 (*)    Glucose, Bld 107 (*)    Creatinine, Ser 1.95 (*)    Calcium  8.7 (*)    GFR, Estimated 37 (*)    All other components within normal limits  CBC  TROPONIN I (HIGH SENSITIVITY)  TROPONIN I (HIGH SENSITIVITY)    EKG: EKG Interpretation Date/Time:  Wednesday January 15 2024 12:11:42 EDT Ventricular Rate:  86 PR Interval:  178 QRS Duration:  90 QT Interval:  364 QTC Calculation: 435 R Axis:   -30  Text Interpretation: Normal sinus rhythm Left axis deviation Cannot rule out Anterior infarct , age undetermined Abnormal ECG When compared with ECG of 30-Sep-2023 05:36, No significant change since last tracing Confirmed by Racheal Buddle [phone]) on 01/15/2024 6:59:23 PM  Radiology: Lenell Query Chest 2 View Result Date: 01/15/2024 CLINICAL DATA:  Nonradiating chest pain EXAM: CHEST - 2 VIEW COMPARISON:  09/30/2023 FINDINGS: The heart size and mediastinal contours are within normal limits. Both lungs are clear. The visualized skeletal structures are unremarkable. IMPRESSION: No active cardiopulmonary disease. Electronically Signed   By: Bobbye Burrow M.D.   On: 01/15/2024 14:44    {Document cardiac monitor, telemetry assessment procedure when appropriate:32947} Procedures   Medications Ordered in the ED - No data to display    {Click here for ABCD2, HEART and other calculators REFRESH Note before signing:1}                              Medical Decision Making Amount and/or Complexity of Data Reviewed Labs: ordered. Radiology: ordered.   This patient complains of ***; this involves an extensive number of treatment Options and is a complaint that carries with it a high risk of complications  and morbidity. The differential includes ***  I ordered, reviewed and interpreted labs, which included *** I ordered medication *** and reviewed PMP when indicated. I ordered imaging studies which included *** and I independently    visualized and interpreted imaging which showed *** Additional history obtained from *** Previous records obtained and reviewed *** I consulted *** and discussed lab and imaging findings and discussed disposition.  Cardiac monitoring reviewed, *** Social determinants considered, *** Critical Interventions: ***  After the interventions stated above, I reevaluated the patient and found *** Admission and  further testing considered, ***   {Document critical care time when appropriate  Document review of labs and clinical decision tools ie CHADS2VASC2, etc  Document your independent review of radiology images and any outside records  Document your discussion with family members, caretakers and with consultants  Document social determinants of health affecting pt's care  Document your decision making why or why not admission, treatments were needed:32947:::1}   Final diagnoses:  None    ED Discharge Orders     None

## 2024-01-15 NOTE — ED Notes (Signed)
 Pt reports last emptying urine drainage bag last night, 50ml present. Pt reports experiencing urine leakage around foley tubing from penis. Tubing and bag replaced, No urine draining. Bladder scan shows >999 ml. Verbal order to replace catheter, urine returned.

## 2024-01-15 NOTE — ED Provider Triage Note (Signed)
 Emergency Medicine Provider Triage Evaluation Note  Edwin [name] , a 67 y.o. male  was evaluated in triage.  Pt complains of patient presents after episode of chest pain that occurred earlier today.  Pain has since resolved.  Pain was left-sided, nonradiating.  Review of Systems  Positive: Chest pain Negative: Fever  Physical Exam  BP 124/80   Pulse 86   Temp 97.9 F (36.6 C)   Resp 17   Ht 5' 11 (1.803 m)   Wt 89.4 kg   SpO2 99%   BMI 27.48 kg/m  Gen:   Awake, no distress   Resp:  Normal effort  MSK:   Moves extremities without difficulty  Other:  Neuro grossly intact  Medical Decision Making  Medically screening exam initiated at 12:19 PM.  Appropriate orders placed.  Edwin [name] was informed that the remainder of the evaluation will be completed by another provider, this initial triage assessment does not replace that evaluation, and the importance of remaining in the ED until their evaluation is complete.   Edwin Gandy, MD 01/15/24 [phone]

## 2024-01-15 NOTE — ED Notes (Signed)
 BROTHER CALLED AND STATED PT IS ALSO HAVE FOLEY ISSUES THE BROTHER IS IN ROUTE

## 2024-01-15 NOTE — ED Triage Notes (Signed)
 Pt here from home with c/o chest pain non radiating , no pain on arrival, no sob or nausea  no cardiac hx

## 2024-05-04 ENCOUNTER — Emergency Department (HOSPITAL_BASED_OUTPATIENT_CLINIC_OR_DEPARTMENT_OTHER)
Admission: EM | Admit: 2024-05-04 | Discharge: 2024-05-04 | Disposition: A | Discharge status: Home / Self Care | Attending: Emergency Medicine | Admitting: Emergency Medicine

## 2024-05-04 ENCOUNTER — Encounter (HOSPITAL_BASED_OUTPATIENT_CLINIC_OR_DEPARTMENT_OTHER): Payer: Self-pay | Admitting: Emergency Medicine

## 2024-05-04 ENCOUNTER — Other Ambulatory Visit: Payer: Self-pay

## 2024-05-04 DIAGNOSIS — Z79899 Other long term (current) drug therapy: Secondary | ICD-10-CM | POA: Diagnosis not present

## 2024-05-04 DIAGNOSIS — R300 Dysuria: Secondary | ICD-10-CM | POA: Insufficient documentation

## 2024-05-04 DIAGNOSIS — N3000 Acute cystitis without hematuria: Secondary | ICD-10-CM

## 2024-05-04 DIAGNOSIS — T83031A Leakage of indwelling urethral catheter, initial encounter: Secondary | ICD-10-CM | POA: Insufficient documentation

## 2024-05-04 DIAGNOSIS — Y732 Prosthetic and other implants, materials and accessory gastroenterology and urology devices associated with adverse incidents: Secondary | ICD-10-CM | POA: Diagnosis not present

## 2024-05-04 DIAGNOSIS — T839XXA Unspecified complication of genitourinary prosthetic device, implant and graft, initial encounter: Secondary | ICD-10-CM

## 2024-05-04 DIAGNOSIS — Z7982 Long term (current) use of aspirin: Secondary | ICD-10-CM | POA: Insufficient documentation

## 2024-05-04 DIAGNOSIS — T8389XA Other specified complication of genitourinary prosthetic devices, implants and grafts, initial encounter: Secondary | ICD-10-CM | POA: Insufficient documentation

## 2024-05-04 DIAGNOSIS — Z8546 Personal history of malignant neoplasm of prostate: Secondary | ICD-10-CM | POA: Diagnosis not present

## 2024-05-04 DIAGNOSIS — T83091A Other mechanical complication of indwelling urethral catheter, initial encounter: Secondary | ICD-10-CM | POA: Diagnosis present

## 2024-05-04 LAB — URINALYSIS, ROUTINE W REFLEX MICROSCOPIC
Bilirubin Urine: NEGATIVE
Glucose, UA: NEGATIVE mg/dL
Ketones, ur: NEGATIVE mg/dL
Nitrite: POSITIVE — AB
Protein, ur: 30 mg/dL — AB
Specific Gravity, Urine: 1.009 (ref 1.005–1.030)
WBC, UA: >50 WBC/hpf (ref 0–5)
pH: 6.5 (ref 5.0–8.0)

## 2024-05-04 MED ORDER — CEPHALEXIN 250 MG PO CAPS
500.0000 mg | ORAL_CAPSULE | Freq: Once | ORAL | Status: AC
  Administered 2024-05-04: 500 mg via ORAL
  Filled 2024-05-04: qty 2

## 2024-05-04 MED ORDER — CEPHALEXIN 500 MG PO CAPS: 500.0000 mg | ORAL_CAPSULE | Freq: Three times a day (TID) | ORAL | 0 refills | Status: AC

## 2024-05-04 NOTE — ED Notes (Signed)
 Reviewed AVS/discharge instructions with patient. Time allotted for and all questions answered. Patient is agreeable for d/c and escorted to ED exit by staff.

## 2024-05-04 NOTE — ED Notes (Signed)
 Coude catheter placed with attending ED provider Curatolo at bedside

## 2024-05-04 NOTE — Discharge Instructions (Addendum)
 Take next dose of antibiotic tonight.  Follow-up with primary care and urology.

## 2024-05-04 NOTE — ED Triage Notes (Signed)
 Pt c/o foley catheter not draining starting yesterday

## 2024-05-04 NOTE — ED Provider Notes (Signed)
 [place] [facility] DE[facility]MENT AT [facility] Provider Note   CSN: [id number] Arrival date & time: 05/04/24  1026     Patient presents with: Catheter Obstructed   Edwin [name] is a 68 y.o. male.   Patient here with Foley catheter not fully draining.  He is having leaking around his Foley.  Denies any fever or chills.  History of bladder cancer.  Denies any weakness numbness tingling.  Denies any nausea or vomiting.  Nothing makes it worse or better.  The history is provided by the patient.       Prior to Admission medications   Medication Sig Start Date End Date Taking? Authorizing Provider  cephALEXin  (KEFLEX ) 500 MG capsule Take 1 capsule (500 mg total) by mouth 3 (three) times daily for 7 days. 05/04/24 05/11/24 Yes Phyillis Dascoli, DO  acetaminophen  (TYLENOL ) 325 MG tablet Take 650 mg by mouth every 6 (six) hours as needed for moderate pain.    [provider]  aspirin  EC 81 MG tablet Take 1 tablet (81 mg total) by mouth daily. 10/02/23   Lue Elsie BROCKS, MD  bethanechol  (URECHOLINE ) 5 MG tablet TAKE 1 TABLET (5 MG TOTAL) BY MOUTH IN THE MORNING AND AT BEDTIME 06/04/23   McKenzie, Belvie CROME, MD  CAPLYTA  42 MG capsule Take 42 mg by mouth every morning. 08/20/23   [provider]  EX-LAX 15 MG CHEW Chew 15-30 mg by mouth 2 (two) times daily as needed (for constipation).    [provider]  furosemide  (LASIX ) 20 MG tablet Take 20 mg by mouth daily as needed for fluid or edema. 05/11/22   [provider]  Ipratropium-Albuterol  (COMBIVENT  RESPIMAT) 20-100 MCG/ACT AERS respimat INHALE 1 PUFF INTO THE LUNGS EVERY 6 HOURS AS NEEDED FOR WHEEZING OR SHORTNESS OF BREATH. Patient taking differently: Inhale 1 puff into the lungs every 6 (six) hours as needed for wheezing or shortness of breath. 06/04/22   Sood, Vineet, MD  polyethylene glycol powder (GLYCOLAX /MIRALAX ) 17 GM/SCOOP powder Take 17 g by mouth daily as needed for mild constipation.     [provider]  temazepam  (RESTORIL ) 30 MG capsule Take 30 mg by mouth at bedtime. 09/21/23   [provider]    Allergies: Patient has no known allergies.    Review of Systems  Updated Vital Signs BP 113/72   Pulse 100   Temp 97.8 F (36.6 C) (Oral)   Resp 18   SpO2 99%   Physical Exam Vitals and nursing note reviewed.  Constitutional:      General: He is not in acute distress.    Appearance: He is well-developed.  HENT:     Head: Normocephalic and atraumatic.     Mouth/Throat:     Mouth: Mucous membranes are moist.  Eyes:     Extraocular Movements: Extraocular movements intact.     Conjunctiva/sclera: Conjunctivae normal.     Pupils: Pupils are equal, round, and reactive to light.  Cardiovascular:     Rate and Rhythm: Normal rate and regular rhythm.     Pulses: Normal pulses.     Heart sounds: No murmur heard. Pulmonary:     Effort: Pulmonary effort is normal. No respiratory distress.     Breath sounds: Normal breath sounds.  Abdominal:     Palpations: Abdomen is soft.     Tenderness: There is no abdominal tenderness.  Musculoskeletal:        General: No swelling.     Cervical back: Neck supple.  Skin:    General: Skin is warm and dry.     Capillary Refill: Capillary refill takes less than 2 seconds.  Neurological:     Mental Status: He is alert.  Psychiatric:        Mood and Affect: Mood normal.     (all labs ordered are listed, but only abnormal results are displayed) Labs Reviewed  URINALYSIS, ROUTINE W REFLEX MICROSCOPIC - Abnormal; Notable for the following components:      Result Value   APPearance HAZY (*)    Hgb urine dipstick SMALL (*)    Protein, ur 30 (*)    Nitrite POSITIVE (*)    Leukocytes,Ua LARGE (*)    Bacteria, UA MANY (*)    All other components within normal limits  URINE CULTURE    EKG: None  Radiology: No results found.   Procedures   Medications Ordered in the ED  cephALEXin  (KEFLEX ) capsule 500 mg  (has no administration in time range)                                    Medical Decision Making Amount and/or Complexity of Data Reviewed Labs: ordered.  Risk Prescription drug management.   [name] is here with Foley catheter issue.  Having leaking around the Foley.  Normal vitals.  No fever.  Looks like his [name] is functioning.  This was replaced.  Patient with possible urinalysis that looks infected.  Will treat with antibiotics.  But he has no fever.  He is very well-appearing.  Is feeling much better with new Foley catheter.  He has a history of bladder cancer.  Ultimately will treat with antibiotics and follow-up with primary care.  I do not think he has sepsis or is toxic appearing at this time.  He understands return if he develops fever or worsening symptoms.  Discharged in good condition.  He is not having any abdominal pain and feels much better after Foley catheter.  This chart was dictated using voice recognition software.  Despite best efforts to proofread,  errors can occur which can change the documentation meaning.      Final diagnoses:  Problem with Foley catheter, initial encounter  Acute cystitis without hematuria    ED Discharge Orders          Ordered    cephALEXin  (KEFLEX ) 500 MG capsule  3 times daily        05/04/24 1226               [place], Juliene, DO 05/04/24 1227

## 2024-05-04 NOTE — ED Notes (Signed)
 Pt given leg bag for catheter and placed on leg. Pt is educated on use of leg bag from prior use.

## 2024-05-06 LAB — URINE CULTURE: Culture: >=100000 — AB

## 2024-05-07 ENCOUNTER — Telehealth (HOSPITAL_BASED_OUTPATIENT_CLINIC_OR_DEPARTMENT_OTHER): Payer: Self-pay | Admitting: Emergency Medicine

## 2024-05-07 NOTE — Telephone Encounter (Signed)
 Post ED Visit - Positive Culture Follow-up  Culture report reviewed by antimicrobial stewardship pharmacist: [facility] []  Rankin Dee, Pharm.D. []  Venetia Gully, Pharm.D., BCPS AQ-ID []  Garrel Crews, Pharm.D., BCPS []  Almarie Lunger, Pharm.D., BCPS []  Helen, [address].D., BCPS, AAHIVP []  Rosaline Bihari, Pharm.D., BCPS, AAHIVP []  Vernell Meier, PharmD, BCPS []  Latanya Hint, PharmD, BCPS []  Donald Medley, PharmD, BCPS [x]  Joesph Rocher, PharmD []  Dorothyann Alert, PharmD, BCPS []  Morene Babe, PharmD  [facility] []  Rosaline Edison, PharmD []  Romona Bliss, PharmD []  Dolphus Roller, PharmD []  Veva Seip, Rph []  Vernell Daunt) Leonce, PharmD []  Eva Allis, PharmD []  Rosaline Millet, PharmD []  Iantha Batch, PharmD []  Arvin Gauss, PharmD []  Wanda Hasting, PharmD []  Ronal Rav, PharmD []  Rocky Slade, PharmD []  Bard Jeans, PharmD   Positive urine culture Stop Kelfex pt notified.  No additional antibiotic needed.  Clotilda BROCKS Beni Turrell 05/07/2024, 4:26 PM

## 2024-05-07 NOTE — Progress Notes (Signed)
 ED Antimicrobial Stewardship Positive Culture Follow Up   Edwin [name] is an 67 y.o. male who presented to [facility] on 05/04/2024 with a chief complaint of  Chief Complaint  Patient presents with   Catheter Obstructed    Recent Results (from the past 720 hours)  Urine Culture     Status: Abnormal   Collection Time: 05/04/24 12:21 PM   Specimen: Urine, Clean Catch  Result Value Ref Range Status   Specimen Description   Final    URINE, CLEAN CATCH Performed at [facility], [address], [place], [place] [address]    Special Requests   Final    NONE Performed at [facility], [address] [place], [place] [address]    Culture >=100,000 COLONIES/mL KLEBSIELLA OXYTOCA (A)  Final   Report Status 05/06/2024 FINAL  Final   Organism ID, Bacteria KLEBSIELLA OXYTOCA (A)  Final      Susceptibility   Klebsiella oxytoca - MIC*    AMPICILLIN  RESISTANT Resistant     CEFEPIME  <=0.12 SENSITIVE Sensitive     ERTAPENEM <=0.12 SENSITIVE Sensitive     CEFTRIAXONE  <=0.25 SENSITIVE Sensitive     CIPROFLOXACIN  <=0.06 SENSITIVE Sensitive     GENTAMICIN <=1 SENSITIVE Sensitive     NITROFURANTOIN 64 INTERMEDIATE Intermediate     TRIMETH /SULFA  <=20 SENSITIVE Sensitive     AMPICILLIN /SULBACTAM <=2 SENSITIVE Sensitive     PIP/TAZO Value in next row Sensitive      <=4 SENSITIVEThis is a modified FDA-approved test that has been validated and its performance characteristics determined by the reporting laboratory.  This laboratory is certified under the Clinical Laboratory Improvement Amendments CLIA as qualified to perform high complexity clinical laboratory testing.    MEROPENEM Value in next row Sensitive      <=4 SENSITIVEThis is a modified FDA-approved test that has been validated and its performance characteristics determined by the reporting laboratory.  This laboratory is certified under the Clinical Laboratory Improvement Amendments CLIA as qualified to  perform high complexity clinical laboratory testing.    * >=100,000 COLONIES/mL KLEBSIELLA OXYTOCA    [x]  Treated with cephalexin , organism resistant to prescribed antimicrobial  Plan: Stop cephalexin . Patient presented with no urinary or systemic symptoms of infection. Culture results are likely colonization due to foley catheter. No further antibiotics are indicated at this time.  ED Provider: Lavonia Pat, MD  B. Maegan Margan Elias, PharmD PGY-1 Pharmacy Resident [facility] 05/07/2024 9:18 AM   Monday - Friday phone -  [phone] Saturday - Sunday phone - [phone]

## 2024-06-15 ENCOUNTER — Other Ambulatory Visit: Payer: Self-pay | Admitting: Urology
